# Patient Record
Sex: Male | Born: 1935 | Race: Black or African American | Hispanic: No | Marital: Married | Smoking: Never smoker
Health system: Southern US, Community
[De-identification: ages and names within clinical notes are randomized; demographics above are authoritative.]

## PROBLEM LIST (undated history)

## (undated) DIAGNOSIS — Z8719 Personal history of other diseases of the digestive system: Secondary | ICD-10-CM

## (undated) DIAGNOSIS — R569 Unspecified convulsions: Secondary | ICD-10-CM

## (undated) DIAGNOSIS — R011 Cardiac murmur, unspecified: Secondary | ICD-10-CM

## (undated) DIAGNOSIS — I495 Sick sinus syndrome: Secondary | ICD-10-CM

## (undated) DIAGNOSIS — D649 Anemia, unspecified: Secondary | ICD-10-CM

## (undated) DIAGNOSIS — K5790 Diverticulosis of intestine, part unspecified, without perforation or abscess without bleeding: Secondary | ICD-10-CM

## (undated) DIAGNOSIS — M8580 Other specified disorders of bone density and structure, unspecified site: Secondary | ICD-10-CM

## (undated) DIAGNOSIS — I4892 Unspecified atrial flutter: Secondary | ICD-10-CM

## (undated) DIAGNOSIS — I10 Essential (primary) hypertension: Secondary | ICD-10-CM

## (undated) DIAGNOSIS — I4891 Unspecified atrial fibrillation: Secondary | ICD-10-CM

## (undated) DIAGNOSIS — I639 Cerebral infarction, unspecified: Secondary | ICD-10-CM

## (undated) DIAGNOSIS — K219 Gastro-esophageal reflux disease without esophagitis: Secondary | ICD-10-CM

## (undated) DIAGNOSIS — K222 Esophageal obstruction: Secondary | ICD-10-CM

## (undated) DIAGNOSIS — E7800 Pure hypercholesterolemia, unspecified: Secondary | ICD-10-CM

## (undated) DIAGNOSIS — R001 Bradycardia, unspecified: Secondary | ICD-10-CM

## (undated) DIAGNOSIS — M1990 Unspecified osteoarthritis, unspecified site: Secondary | ICD-10-CM

## (undated) HISTORY — DX: Essential (primary) hypertension: I10

## (undated) HISTORY — DX: Sick sinus syndrome: I49.5

## (undated) HISTORY — DX: Esophageal obstruction: K22.2

## (undated) HISTORY — PX: JOINT REPLACEMENT: SHX530

## (undated) HISTORY — DX: Unspecified atrial fibrillation: I48.91

## (undated) HISTORY — PX: KNEE ARTHROSCOPY: SHX127

## (undated) HISTORY — DX: Gastro-esophageal reflux disease without esophagitis: K21.9

## (undated) HISTORY — DX: Diverticulosis of intestine, part unspecified, without perforation or abscess without bleeding: K57.90

## (undated) HISTORY — DX: Unspecified atrial flutter: I48.92

## (undated) HISTORY — DX: Other specified disorders of bone density and structure, unspecified site: M85.80

## (undated) HISTORY — DX: Unspecified convulsions: R56.9

---

## 1970-06-12 HISTORY — PX: ASD REPAIR: SHX258

## 1992-08-03 ENCOUNTER — Encounter: Payer: Self-pay | Admitting: Gastroenterology

## 1993-09-22 ENCOUNTER — Encounter: Payer: Self-pay | Admitting: Gastroenterology

## 1997-02-17 ENCOUNTER — Encounter: Payer: Self-pay | Admitting: Gastroenterology

## 2000-05-31 ENCOUNTER — Encounter: Admission: RE | Admit: 2000-05-31 | Discharge: 2000-05-31 | Payer: Self-pay | Admitting: General Practice

## 2000-05-31 ENCOUNTER — Encounter: Payer: Self-pay | Admitting: General Practice

## 2000-10-25 ENCOUNTER — Encounter (INDEPENDENT_AMBULATORY_CARE_PROVIDER_SITE_OTHER): Payer: Self-pay | Admitting: *Deleted

## 2000-10-25 ENCOUNTER — Encounter: Admission: RE | Admit: 2000-10-25 | Discharge: 2000-10-25 | Payer: Self-pay | Admitting: Internal Medicine

## 2000-10-25 ENCOUNTER — Encounter: Payer: Self-pay | Admitting: Internal Medicine

## 2000-11-19 ENCOUNTER — Encounter: Payer: Self-pay | Admitting: Gastroenterology

## 2000-11-27 ENCOUNTER — Ambulatory Visit (HOSPITAL_COMMUNITY): Admission: RE | Admit: 2000-11-27 | Discharge: 2000-11-27 | Payer: Self-pay | Admitting: Gastroenterology

## 2000-11-27 ENCOUNTER — Encounter (INDEPENDENT_AMBULATORY_CARE_PROVIDER_SITE_OTHER): Payer: Self-pay

## 2000-11-27 ENCOUNTER — Encounter: Payer: Self-pay | Admitting: Gastroenterology

## 2002-05-27 ENCOUNTER — Encounter: Payer: Self-pay | Admitting: Gastroenterology

## 2002-08-29 ENCOUNTER — Encounter: Payer: Self-pay | Admitting: Neurology

## 2002-08-29 ENCOUNTER — Encounter: Admission: RE | Admit: 2002-08-29 | Discharge: 2002-08-29 | Payer: Self-pay | Admitting: Neurology

## 2005-05-01 ENCOUNTER — Ambulatory Visit (HOSPITAL_COMMUNITY): Admission: RE | Admit: 2005-05-01 | Discharge: 2005-05-01 | Payer: Self-pay | Admitting: Neurology

## 2007-10-03 ENCOUNTER — Encounter: Admission: RE | Admit: 2007-10-03 | Discharge: 2007-10-03 | Payer: Self-pay | Admitting: Internal Medicine

## 2007-10-08 ENCOUNTER — Encounter: Admission: RE | Admit: 2007-10-08 | Discharge: 2007-10-08 | Payer: Self-pay | Admitting: Internal Medicine

## 2008-02-04 ENCOUNTER — Encounter: Admission: RE | Admit: 2008-02-04 | Discharge: 2008-02-04 | Payer: Self-pay | Admitting: Internal Medicine

## 2008-03-27 ENCOUNTER — Ambulatory Visit: Payer: Self-pay | Admitting: Internal Medicine

## 2008-11-16 ENCOUNTER — Ambulatory Visit: Payer: Self-pay | Admitting: Internal Medicine

## 2009-03-16 ENCOUNTER — Ambulatory Visit: Payer: Self-pay | Admitting: Internal Medicine

## 2009-04-16 ENCOUNTER — Encounter (INDEPENDENT_AMBULATORY_CARE_PROVIDER_SITE_OTHER): Payer: Self-pay | Admitting: *Deleted

## 2009-06-12 HISTORY — PX: INGUINAL HERNIA REPAIR: SUR1180

## 2010-01-13 ENCOUNTER — Encounter: Payer: Self-pay | Admitting: Gastroenterology

## 2010-01-13 ENCOUNTER — Ambulatory Visit: Payer: Self-pay | Admitting: Internal Medicine

## 2010-01-15 ENCOUNTER — Encounter (INDEPENDENT_AMBULATORY_CARE_PROVIDER_SITE_OTHER): Payer: Self-pay | Admitting: *Deleted

## 2010-01-17 ENCOUNTER — Encounter: Admission: RE | Admit: 2010-01-17 | Discharge: 2010-01-17 | Payer: Self-pay | Admitting: Internal Medicine

## 2010-02-15 ENCOUNTER — Ambulatory Visit (HOSPITAL_COMMUNITY): Admission: RE | Admit: 2010-02-15 | Discharge: 2010-02-15 | Payer: Self-pay | Admitting: Internal Medicine

## 2010-02-18 ENCOUNTER — Ambulatory Visit (HOSPITAL_COMMUNITY): Admission: RE | Admit: 2010-02-18 | Discharge: 2010-02-18 | Payer: Self-pay | Admitting: General Surgery

## 2010-02-18 ENCOUNTER — Encounter (INDEPENDENT_AMBULATORY_CARE_PROVIDER_SITE_OTHER): Payer: Self-pay | Admitting: General Surgery

## 2010-03-07 HISTORY — PX: CARDIOVASCULAR STRESS TEST: SHX262

## 2010-04-04 ENCOUNTER — Ambulatory Visit: Payer: Self-pay | Admitting: Internal Medicine

## 2010-06-12 DIAGNOSIS — I639 Cerebral infarction, unspecified: Secondary | ICD-10-CM

## 2010-06-12 HISTORY — DX: Cerebral infarction, unspecified: I63.9

## 2010-07-02 ENCOUNTER — Encounter: Payer: Self-pay | Admitting: Neurology

## 2010-07-03 ENCOUNTER — Encounter: Payer: Self-pay | Admitting: General Surgery

## 2010-07-12 ENCOUNTER — Ambulatory Visit
Admission: RE | Admit: 2010-07-12 | Discharge: 2010-07-12 | Payer: Self-pay | Source: Home / Self Care | Attending: Internal Medicine | Admitting: Internal Medicine

## 2010-07-14 ENCOUNTER — Encounter: Payer: Self-pay | Admitting: Gastroenterology

## 2010-07-14 ENCOUNTER — Other Ambulatory Visit: Payer: 59 | Admitting: Internal Medicine

## 2010-07-20 NOTE — Letter (Signed)
Summary: New Patient letter  [facility]  [address] [place], [place] [address]   Phone: [phone]  Fax: [phone]       07/14/2010 MRN: [id number]  [facility] [address] [place], [place]  [address]  Dear Jay Walters,  Welcome to the Gastroenterology Division at [facility].    You are scheduled to see Dr.  Russella Dar on 08-10-10 at 10:45am on the 3rd floor at [facility], [address]  We ask that you try to arrive at our office 15 minutes prior to your appointment time to allow for check-in.  We would like you to complete the enclosed self-administered evaluation form prior to your visit and bring it with you on the day of your appointment.  We will review it with you.  Also, please bring a complete list of all your medications or, if you prefer, bring the medication bottles and we will list them.  Please bring your insurance card so that we may make a copy of it.  If your insurance requires a referral to see a specialist, please bring your referral form from your primary care physician.  Co-payments are due at the time of your visit and may be paid by cash, check or credit card.     Your office visit will consist of a consult with your physician (includes a physical exam), any laboratory testing he/she may order, scheduling of any necessary diagnostic testing (e.g. x-ray, ultrasound, CT-scan), and scheduling of a procedure (e.g. Endoscopy, Colonoscopy) if required.  Please allow enough time on your schedule to allow for any/all of these possibilities.    If you cannot keep your appointment, please call [phone] to cancel or reschedule prior to your appointment date.  This allows [place] the opportunity to schedule an appointment for another patient in need of care.  If you do not cancel or reschedule by 5 p.m. the business day prior to your appointment date, you will be charged a $50.00 late cancellation/no-show fee.    Thank you for choosing  [facility] for your medical needs.  We appreciate the opportunity to care for you.  Please visit [place] at our website  to learn more about our practice.                     Sincerely,                                                             The Gastroenterology Division

## 2010-08-10 ENCOUNTER — Ambulatory Visit: Payer: 59 | Admitting: Gastroenterology

## 2010-08-16 ENCOUNTER — Encounter: Payer: Self-pay | Admitting: Gastroenterology

## 2010-08-16 ENCOUNTER — Ambulatory Visit (INDEPENDENT_AMBULATORY_CARE_PROVIDER_SITE_OTHER): Payer: 59 | Admitting: Gastroenterology

## 2010-08-16 DIAGNOSIS — K219 Gastro-esophageal reflux disease without esophagitis: Secondary | ICD-10-CM | POA: Insufficient documentation

## 2010-08-16 DIAGNOSIS — Z1211 Encounter for screening for malignant neoplasm of colon: Secondary | ICD-10-CM

## 2010-08-16 DIAGNOSIS — R1319 Other dysphagia: Secondary | ICD-10-CM

## 2010-08-18 NOTE — Assessment & Plan Note (Signed)
Summary: Gastroenterology     [facility]   GI CONSULTATION  NAME:  Jay Walters, Jay Walters            OFFICE NO:  [id number]  DATE:  11/19/2000      REQUESTING PHYSICIAN:  Dr. Eden Emms Baxley  HISTORY OF PRESENT ILLNESS:  The patient is a very nice 77 year old African-American male that I have seen in the past.  He notes difficulty swallowing primarily solids but also liquids for the past three months.  He has had occasions where he has had to regurgitate food to relieve a transient impaction.  He was evaluated by Dr. Lenord Fellers and underwent an upper GI series, which showed a small sliding hiatal hernia and a prominent Schatzki ring located at the esophagogastric junction, which caused delay at the Schatzki ring.  He was placed on Nexium and has had some improvement in his symptoms.  CURRENT MEDICATIONS:  As listed on the front of the chart, updated and reviewed.  ALLERGIES:  None known.  PHYSICAL EXAMINATION:  GENERAL:  In no acute distress.  VITAL SIGNS:  Weight 157 pounds.  Blood pressure is 120/70.  Pulse is 64 and regular.  HEENT:  Anicteric sclerae.  Oropharynx is clear.  CHEST:  Clear to auscultation and percussion.  CARDIAC:  Regular rate and rhythm without murmurs.  ABDOMEN:  Soft and nontender.  Nondistended with normoactive bowel sounds.  No palpable organomegaly, masses or hernias.  ASSESSMENT/PLAN: 1.   Dysphagia and esophageal stricture.  Rule out peptic stricture.  Unlikely to be neoplastic based on upper GI series findings.  Risks, benefits and alternatives to upper endoscopy with possible biopsy and Savary dilation discussed with the patient.  He consents to proceed.  This will be scheduled electively.  Continue antireflux measures and Nexium 40 mg q.d. for now.       Shamarr Faucett T. Pleas Koch., M.D., F.A.C.G.  C:  Dr. Eden Emms Baxley [id number]   D:  11/19/2000   T:  11/19/2000  Job # [id number]

## 2010-08-18 NOTE — Procedures (Signed)
Summary: Colonoscopy   Colonoscopy  Procedure date:  05/27/2002  Findings:      Results: Hemorrhoids.     Results: Diverticulosis.       Location:  [facility].    Procedures Next Due Date:    Colonoscopy: 06/2009  Colonoscopy  Procedure date:  05/27/2002  Findings:      Results: Hemorrhoids.     Results: Diverticulosis.       Location:  [facility].    Procedures Next Due Date:    Colonoscopy: 06/2009 Patient Name: Jay, Walters MRN: [id number] Procedure Procedures: Colonoscopy CPT: 54098.  Personnel: Endoscopist: Venita Lick. Russella Dar, MD, Clementeen Graham.  Exam Location: Exam performed in Outpatient Clinic. Outpatient  Patient Consent: Procedure, Alternatives, Risks and Benefits discussed, consent obtained, from patient. Consent was obtained by the RN.  Indications  Average Risk Screening Routine.  History  Pre-Exam Physical: Performed May 27, 2002. Entire physical exam was normal.  Exam Exam: Extent of exam reached: Cecum, extent intended: Cecum.  The cecum was identified by appendiceal orifice and IC valve. Colon retroflexion performed. ASA Classification: II. Tolerance: excellent.  Monitoring: Pulse and BP monitoring, Oximetry used. Supplemental O2 given.  Colon Prep Used Golytely for colon prep. Prep results: good.  Sedation Meds: Patient assessed and found to be appropriate for moderate (conscious) sedation. Fentanyl 50 mcg. given IV. Versed 5 mg. given IV.  Findings - DIVERTICULOSIS: Sigmoid Colon. Not bleeding. ICD9: Diverticulosis: 562.10. Comments: mild.  NORMAL EXAM: Cecum to Descending Colon.  HEMORRHOIDS: Internal. Size: Medium. Not bleeding. Not thrombosed. ICD9: Hemorrhoids, Internal: 455.0.   Assessment  Diagnoses: 562.10: Diverticulosis.  455.0: Hemorrhoids, Internal.   Events  Unplanned Interventions: No intervention was required.  Unplanned Events: There were no complications. Plans Medication Plan: Continue  current medications.  Patient Education: Patient given standard instructions for: Diverticulosis. Hemorrhoids.  Disposition: After procedure patient sent to recovery. After recovery patient sent home.  Scheduling/Referral: Colonoscopy, to [facility] T. Russella Dar, MD, [facility], around May 27, 2009.  Primary Care Provider, to Sharlet Salina, MD,    This report was created from the original endoscopy report, which was reviewed and signed by the above listed endoscopist.    cc: Sharlet Salina, MD

## 2010-08-18 NOTE — Procedures (Signed)
Summary: EGD and biopsy   EGD  Procedure date:  11/27/2000  Findings:      Findings: Stricture, Hiatal Hernia Location: [facility]    EGD  Procedure date:  11/27/2000  Findings:      Findings: Stricture, Hiatal Hernia Location: [facility]   Patient Name: Jay Walters, Jay Walters MRN: [id number] Procedure Procedures: Panendoscopy (EGD) CPT: 43235.    with biopsy(s)/brushing(s). CPT: D1846139.    with esophageal dilation. CPT: G9296129.  Personnel: Endoscopist: Venita Lick. Russella Dar, MD, Clementeen Graham.  Referred By: Sharlet Salina, MD.  Exam Location: Exam performed in Radiology. Outpatient  Patient Consent: Procedure, Alternatives, Risks and Benefits discussed, consent obtained, from patient.  Indications  Abnormal Exams, Studies: UGI, abnormal, do not suspect malignancy.  Therapeutics: Reason for exam: Esophageal dilation.  Symptoms: Dysphagia.  History  Pre-Exam Physical: Performed Nov 27, 2000  Cardio-pulmonary exam, HEENT exam, Abdominal exam, Extremity exam, Neurological exam, Mental status exam WNL.  Exam Exam Info: Maximum depth of insertion Duodenum, intended Duodenum. Patient position: on left side. Vocal cords not visualized. Gastric retroflexion performed. ASA Classification: II. Tolerance: good.  Sedation Meds: Patient assessed and found to be appropriate for moderate (conscious) sedation. Fentanyl 50 mcg. Versed 4 mg. Cetacaine Spray 2 sprays  Monitoring: BP and pulse monitoring done. Oximetry used. Supplemental O2 given  Fluoroscopy: Fluoroscopy was used.  Findings STRICTURE / STENOSIS: Stricture in Distal Esophagus.  Constriction: partial. Biopsy of Stricture/Steno  taken. ICD9: Esophageal Stricture: 530.3.  - Dilation: Distal Esophagus. Procedure was performed under Fluoroscopy. Savary dilator used, Diameter: 14 mm, Minimal Resistance, Minimal Heme present on extraction. Savary dilator used, Diameter: 15 mm, Minimal Resistance, Minimal Heme  present on extraction. Savary dilator used, Diameter: 16 mm, Minimal Resistance, Minimal Heme present on extraction. Patient tolerance good. Outcome: successful.  HIATAL HERNIA: Regular, 2 cms. in length. ICD9: Hernia, Hiatal: 553.3.  Assessment  Diagnoses: 530.3: Esophageal Stricture.  553.3: Hernia, Hiatal.   Events  Unplanned Intervention: No unplanned interventions were required.  Unplanned Events: There were no complications. Plans Medication(s): Await pathology. Continue current medications. PPI: Esomeprazole/Nexium 40 mg QD, for indefinitely.   Patient Education: Patient given standard instructions for: Reflux. Stenosis / Stricture.  Disposition: After procedure patient sent to recovery.  Scheduling: Office Visit, to [employer]. Russella Dar, MD, Clementeen Graham, prn  Referring provider, to Sharlet Salina, MD, Nov 28, 2000.    This report was created from the original endoscopy report, which was reviewed and signed by the above listed endoscopist.    cc: Luanna Cole. Lenord Fellers, MD    SP Surgical Pathology - STATUS: Final             By: Guilford Shi MD , Clovis Pu       Perform Date: 18Jun02 08:47  Ordered By: Rica Records Date:  Facility: [facility]                              Department: CPATH  Service Report Text  [facility]   [address] [place], [place] [address]   [phone]    REPORT OF SURGICAL PATHOLOGY    Case #: [id number]   Patient Name: Jay Walters, Jay Walters   PID: [id number]   Pathologist: Marcie Bal, MD   DOB/Age 05-23-1932 (Age: 77) Gender: M   Date Taken: 11/27/2000   Date Received: 11/27/2000    FINAL  DIAGNOSIS    ***MICROSCOPIC EXAMINATION AND DIAGNOSIS***    INFLAMED SQUAMOUS MUCOSA CONSISTENT WITH REFLUX ESOPHAGITIS, NO   BARRETT' S ESOPHAGUS OR TUMOR IDENTIFIED.    COMMENT   An Alcian Blue stain is performed to determine the presence of   intestinal metaplasia. No intestinal metaplasia is identified    with the Alcian Blue stain. The control stained appropriately.    ab   Date Reported: 11/28/2000 Marcie Bal, MD   *** Electronically Signed Out By TAZ ***    Clinical information   Dysphagia, R/O peptic stricture. (tmc)    specimen(s) obtained   Distal esophageal stricture, biopsy    Gross Description   Received in formalin is a tan, soft tissue fragment that is   submitted en toto. Size: 0.2 cm (SSW:smr 6/18)    smr/

## 2010-08-23 NOTE — Letter (Signed)
Summary: Sharlet Salina MD  Sharlet Salina MD   Imported By: Lester Blanchard 08/19/2010 07:17:11  _____________________________________________________________________  External Attachment:    Type:   Image     Comment:   External Document

## 2010-08-23 NOTE — Procedures (Signed)
Summary: EGD/[facility]  EGD/[facility]   Imported By: Sherian Rein 08/17/2010 07:18:20  _____________________________________________________________________  External Attachment:    Type:   Image     Comment:   External Document

## 2010-08-23 NOTE — Procedures (Signed)
Summary: Flexible Sigmoidoscopy/[facility]  Flexible Sigmoidoscopy/ [facility]   Imported By: Sherian Rein 08/17/2010 07:16:51  _____________________________________________________________________  External Attachment:    Type:   Image     Comment:   External Document

## 2010-08-23 NOTE — Letter (Signed)
Summary: EGD Instructions  [facility]  [address] [place], [place] [address]   Phone: [phone]  Fax: [phone]       Jay Walters    1932/04/15    MRN: [id number]       Procedure Day /Date: Tuesday March 27th, 2012     Arrival Time:  3:00pm     Procedure Time: 4:00pm     Location of Procedure:                    _ x _ [facility] (4th Floor)    PREPARATION FOR ENDOSCOPY   On 09/06/10 THE DAY OF THE PROCEDURE:  1.   No solid foods, milk or milk products are allowed after midnight the night before your procedure.  2.   Do not drink anything colored red or purple.  Avoid juices with pulp.  No orange juice.  3.  You may drink clear liquids until 2:00pm, which is 2 hours before your procedure.                                                                                                CLEAR LIQUIDS INCLUDE: Water Jello Ice Popsicles Tea (sugar ok, no milk/cream) Powdered fruit flavored drinks Coffee (sugar ok, no milk/cream) Gatorade Juice: apple, white grape, white cranberry  Lemonade Clear bullion, consomm, broth Carbonated beverages (any kind) Strained chicken noodle soup Hard Candy   MEDICATION INSTRUCTIONS  Unless otherwise instructed, you should take regular prescription medications with a small sip of water as early as possible the morning of your procedure.        OTHER INSTRUCTIONS  You will need a responsible adult at least 75 years of age to accompany you and drive you home.   This person must remain in the waiting room during your procedure.  Wear loose fitting clothing that is easily removed.  Leave jewelry and other valuables at home.  However, you may wish to bring a book to read or an iPod/MP3 player to listen to music as you wait for your procedure to start.  Remove all body piercing jewelry and leave at home.  Total time from sign-in until discharge is approximately 2-3 hours.  You should go home  directly after your procedure and rest.  You can resume normal activities the day after your procedure.  The day of your procedure you should not:   Drive   Make legal decisions   Operate machinery   Drink alcohol   Return to work  You will receive specific instructions about eating, activities and medications before you leave.    The above instructions have been reviewed and explained to me by   Marchelle Folks.     I fully understand and can verbalize these instructions _____________________________ Date _________

## 2010-08-23 NOTE — Assessment & Plan Note (Signed)
Summary: DYSPHAGIA/SCHED W-STACY/INS [employer]/MAILER SENT/CX FEE   History of Present Illness Visit Type: Initial Consult Primary GI MD: Elie Goody MD [facility] Primary Provider: Marlan Palau, MD Requesting Provider: Marlan Palau, MD Chief Complaint: dysphagia, up until 1 month ago, symptoms stopped with medication History of Present Illness:    This is a 77 year old male is seen in the past for GERD with an esophageal stricture. He underwent upper endoscopy with dilation in June 2002. He states he took Nexium on a regular basis since that time. He developed problems with intermittent solid food dysphagia, mainly to meat, rice and griits, for the past year. His primary physician changed him from Nexium to Protonix and for the past month he has not had dysphasia.   GI Review of Systems    Reports acid reflux and  dysphagia with solids.      Denies abdominal pain, belching, bloating, chest pain, dysphagia with liquids, heartburn, loss of appetite, nausea, vomiting, vomiting blood, weight loss, and  weight gain.        Denies anal fissure, black tarry stools, change in bowel habit, constipation, diarrhea, diverticulosis, fecal incontinence, heme positive stool, hemorrhoids, irritable bowel syndrome, jaundice, light color stool, liver problems, rectal bleeding, and  rectal pain.   Current Medications (verified): 1)  Digoxin 0.125 Mg Tabs (Digoxin) .... Take 1/2 Tablet By Mouth Once Daily 2)  Finasteride 5 Mg Tabs (Finasteride) .... Take 1 Tablet By Mouth Once Daily 3)  Protonix 40 Mg Tbec (Pantoprazole Sodium) .... Take 1 Tablet By Mouth Once Daily 4)  Silymarin  Caps (Milk Thistle-Turmeric) .... Once Daily 5)  Ginseng 250 Mg Caps (Ginseng) .... Once Daily 6)  Vitamin D3 1000 Unit Caps (Cholecalciferol) .... Take 2 Capsule Once Daily 7)  Aleve 220 Mg Tabs (Naproxen Sodium) .... Take 2 By Mouth Once Daily As Needed 8)  Aspirin 81 Mg Tbec (Aspirin) .... Once Daily 9)  Doxazosin Mesylate 4  Mg Tabs (Doxazosin Mesylate) .... Take 1 Tablet By Mouth Once Daily 10)  Lipitor 40 Mg Tabs (Atorvastatin Calcium) .... Take 1 Tablet By Mouth Once Daily 11)  Phenytoin Sodium Extended 100 Mg Caps (Phenytoin Sodium Extended) .... At Bedtime  Allergies (verified): No Known Drug Allergies  Past History:  Past Medical History: GERD Hiatal hernia Esophageal Stricture Atrial Fibrillation Diverticulosis Hemorrhoids Hypertension Seizures Osteopenia  Past Surgical History: ASD repair 1972 Knee arthroscopy Right inguinal hernia repair 2011  Family History: No FH of Colon Cancer: Family History of Heart Disease: Father  Social History: Married Patient is a former smoker.  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         The pertinent positives and negatives are noted as above and in the HPI. All other ROS were reviewed and were negative.   Vital Signs:  Patient profile:   77 year old male Height:      69 inches Weight:      143.38 pounds BMI:     21.25 Pulse rate:   64 / minute Pulse rhythm:   regular BP sitting:   130 / 70  (right arm) Cuff size:   regular  Vitals Entered By: June McMurray CMA Duncan Dull) (August 16, 2010 2:24 PM)  Physical Exam  General:  Well developed, well nourished, no acute distress. Head:  Normocephalic and atraumatic. Eyes:  PERRLA,  no icterus. Ears:  Normal auditory acuity. Mouth:  No deformity or lesions, dentition normal. Neck:  Supple; no masses or thyromegaly. Lungs:  Clear throughout to auscultation. Heart:  Regular rate and rhythm; no murmurs, rubs,  or bruits. Abdomen:  Soft, nontender and nondistended. No masses, hepatosplenomegaly or hernias noted. Normal bowel sounds. Msk:  Symmetrical with no gross deformities. Normal posture. Pulses:  Normal pulses noted. Extremities:  No clubbing, cyanosis,  edema or deformities noted. Neurologic:  Alert and  oriented x4;  grossly normal neurologically. Cervical Nodes:  No significant cervical adenopathy. Inguinal Nodes:  No significant inguinal adenopathy. Psych:  Alert and cooperative. Normal mood and affect.  Impression & Recommendations:  Problem # 1:  DYSPHAGIA (ICD-787.29)  Solid food dysphagia. I suspect he has a recurrent esophageal stricture. Continue standard antireflux measures and Protonix on a daily basis. The risks, benefits and alternatives to endoscopy with possible biopsy and possible dilation were discussed with the patient and they consent to proceed. The procedure will be scheduled electively. Orders: EGD SAV (EGD SAV)  Problem # 2:  GERD (ICD-530.81)  As above. Orders: EGD SAV (EGD SAV)  Problem # 3:  SCREENING COLORECTAL-CANCER (ICD-V76.51)  Screening colonoscopy recommended December 2013.  Patient Instructions: 1)  Upper Endoscopy with Dilatation brochure given.  2)  Copy sent to : Marlan Palau, MD 3)  The medication list was reviewed and reconciled.  All changed / newly prescribed medications were explained.  A complete medication list was provided to the patient / caregiver.

## 2010-08-23 NOTE — Procedures (Signed)
Summary: [employer]   Imported By: Sherian Rein 08/17/2010 07:15:12  _____________________________________________________________________  External Attachment:    Type:   Image     Comment:   External Document

## 2010-08-23 NOTE — Procedures (Signed)
Summary: Flexible Sigmoidoscopy/[facility]  Flexible Sigmoidoscopy/[facility]   Imported By: Sherian Rein 08/17/2010 07:20:08  _____________________________________________________________________  External Attachment:    Type:   Image     Comment:   External Document

## 2010-08-25 LAB — CBC
HCT: 41.6 % (ref 39.0–52.0)
MCHC: 33.2 g/dL (ref 30.0–36.0)
RDW: 13.6 % (ref 11.5–15.5)

## 2010-08-25 LAB — DIFFERENTIAL
Basophils Absolute: 0 10*3/uL (ref 0.0–0.1)
Basophils Relative: 1 % (ref 0–1)
Eosinophils Absolute: 0.1 10*3/uL (ref 0.0–0.7)
Eosinophils Relative: 2 % (ref 0–5)
Monocytes Absolute: 0.3 10*3/uL (ref 0.1–1.0)
Neutro Abs: 1.5 10*3/uL — ABNORMAL LOW (ref 1.7–7.7)

## 2010-08-25 LAB — BASIC METABOLIC PANEL
BUN: 10 mg/dL (ref 6–23)
GFR calc non Af Amer: >60 mL/min (ref >60)
Glucose, Bld: 96 mg/dL (ref 70–99)
Potassium: 4.2 mEq/L (ref 3.5–5.1)

## 2010-09-06 ENCOUNTER — Ambulatory Visit (AMBULATORY_SURGERY_CENTER): Payer: 59 | Admitting: Gastroenterology

## 2010-09-06 ENCOUNTER — Encounter: Payer: Self-pay | Admitting: Gastroenterology

## 2010-09-06 VITALS — BP 147/72 | HR 52 | Temp 96.6°F | Resp 20 | Ht 69.0 in | Wt 143.0 lb

## 2010-09-06 DIAGNOSIS — R1319 Other dysphagia: Secondary | ICD-10-CM

## 2010-09-06 DIAGNOSIS — K209 Esophagitis, unspecified: Secondary | ICD-10-CM

## 2010-09-06 DIAGNOSIS — K219 Gastro-esophageal reflux disease without esophagitis: Secondary | ICD-10-CM

## 2010-09-06 DIAGNOSIS — R1310 Dysphagia, unspecified: Secondary | ICD-10-CM

## 2010-09-06 DIAGNOSIS — K222 Esophageal obstruction: Secondary | ICD-10-CM

## 2010-09-06 DIAGNOSIS — R933 Abnormal findings on diagnostic imaging of other parts of digestive tract: Secondary | ICD-10-CM

## 2010-09-06 NOTE — Progress Notes (Signed)
Dilated with savory 14mm 15mm 16mm

## 2010-09-06 NOTE — Patient Instructions (Signed)
Findings: Esophagitis r/o candida Esophageal Stricture-Dilated Small Hiatal Hernia  Await pathology results Dilatation diet as follows: NOTHING BY MOUTH UNTIL 5:15pm     -from 5:15pm-6:15pm you may have CLEAR LIQUIDS ONLY     -6:15pm until tomorrow you may have a soft diet     -please see the instruction sheet for recommended foods to eat during this time

## 2010-09-07 ENCOUNTER — Telehealth: Payer: Self-pay | Admitting: *Deleted

## 2010-09-07 NOTE — Telephone Encounter (Signed)
Message left to call if needs anything. 

## 2010-09-12 ENCOUNTER — Encounter: Payer: Self-pay | Admitting: Gastroenterology

## 2010-09-13 NOTE — Procedures (Signed)
Summary: Upper Endoscopy w/DIL  Patient: [name] Note: All result statuses are Final unless otherwise noted.  Tests: (1) Upper Endoscopy w/DIL (UED)  UED Upper Endoscopy w/DIL                             DONE      Endoscopy Center     [address]     [place], [place]  [address]          ENDOSCOPY PROCEDURE REPORT          PATIENT:  Jay, Walters  MR#:  [id number]     BIRTHDATE:  1931-08-04, 78 yrs. old  GENDER:  male     ENDOSCOPIST:  Judie Petit T. Russella Dar, MD, [facility]          PROCEDURE DATE:  09/06/2010     PROCEDURE:  EGD with dilatation over guidewire, EGD with biopsy     ASA CLASS:  Class II     INDICATIONS:  1) dysphagia  2) GERD     MEDICATIONS:  Fentanyl 25 mcg IV, Versed 2 mg IV     TOPICAL ANESTHETIC:  Exactacain Spray     DESCRIPTION OF PROCEDURE:   After the risks benefits and     alternatives of the procedure were thoroughly explained, informed     consent was obtained.  The [id number] endoscope was     introduced through the mouth and advanced to the second portion of     the duodenum, without limitations.  The instrument was slowly     withdrawn as the mucosa was carefully examined.     <<PROCEDUREIMAGES>>     Esophagitis was found in the distal esophagus with scattered white     exudates. Multiple biopsies were obtained and sent to pathology.     A stricture was found at the gastroesophageal junction. It was     circumferential and benign appearing. It was 12 mm in diameter.     Multiple biopsies were obtained and sent to pathology. The stomach     was entered and closely examined. The pylorus, antrum, angularis,     and lesser curvature were well visualized, including a retroflexed     view of the cardia and fundus. The stomach wall was normally     distensable. The scope passed easily through the pylorus into the     duodenum.  The duodenal bulb was normal in appearance, as was the     postbulbar duodenum.  The examination was otherwise  normal.  A     hiatal hernia was found in the cardia, small.  Dilation was then     performed at the gastroesphageal junction:          1) Dilator:  Savary over guidewire  Sizes:  14mm, 15mm, 16mm     Resistance:  minimal  Heme:  none          COMPLICATIONS:  None          ENDOSCOPIC IMPRESSION:     1) Exudative esophagitis-R/O candida     2) Stricture at the gastroesophageal junction     3) Small hiatal hernia          RECOMMENDATIONS:     1) await pathology results     2) anti-reflux regimen long term     3) continue PPI long term     4) post dilation instructions  Venita Lick. Russella Dar, MD, Clementeen Graham          CC: Sharlet Salina, MD          n.     Rosalie DoctorVenita Lick. Quade Ramirez at 09/06/2010 04:11 PM          [address], [place], [id number]  Note: An exclamation mark (!) indicates a result that was not dispersed into the flowsheet. Document Creation Date: 09/06/2010 4:12 PM _______________________________________________________________________  (1) Order result status: Final Collection or observation date-time: 09/06/2010 16:01 Requested date-time:  Receipt date-time:  Reported date-time:  Referring Physician:   Ordering Physician: Claudette Head [phone]) Specimen Source:  Source: Launa Grill Order Number: [id number] Lab site:

## 2011-03-14 ENCOUNTER — Encounter: Payer: Self-pay | Admitting: Internal Medicine

## 2011-04-18 ENCOUNTER — Ambulatory Visit (INDEPENDENT_AMBULATORY_CARE_PROVIDER_SITE_OTHER): Payer: 59 | Admitting: Internal Medicine

## 2011-04-18 ENCOUNTER — Encounter: Payer: Self-pay | Admitting: Internal Medicine

## 2011-04-18 VITALS — BP 142/62 | HR 68 | Temp 96.9°F | Ht 69.25 in | Wt 136.0 lb

## 2011-04-18 DIAGNOSIS — M5410 Radiculopathy, site unspecified: Secondary | ICD-10-CM

## 2011-04-18 DIAGNOSIS — E559 Vitamin D deficiency, unspecified: Secondary | ICD-10-CM | POA: Insufficient documentation

## 2011-04-18 DIAGNOSIS — Z8774 Personal history of (corrected) congenital malformations of heart and circulatory system: Secondary | ICD-10-CM

## 2011-04-18 DIAGNOSIS — G40909 Epilepsy, unspecified, not intractable, without status epilepticus: Secondary | ICD-10-CM

## 2011-04-18 DIAGNOSIS — M8580 Other specified disorders of bone density and structure, unspecified site: Secondary | ICD-10-CM | POA: Insufficient documentation

## 2011-04-18 DIAGNOSIS — M949 Disorder of cartilage, unspecified: Secondary | ICD-10-CM

## 2011-04-18 DIAGNOSIS — R29898 Other symptoms and signs involving the musculoskeletal system: Secondary | ICD-10-CM

## 2011-04-18 DIAGNOSIS — M542 Cervicalgia: Secondary | ICD-10-CM

## 2011-04-18 DIAGNOSIS — Z8679 Personal history of other diseases of the circulatory system: Secondary | ICD-10-CM

## 2011-04-18 DIAGNOSIS — M5412 Radiculopathy, cervical region: Secondary | ICD-10-CM

## 2011-04-18 DIAGNOSIS — Z9889 Other specified postprocedural states: Secondary | ICD-10-CM

## 2011-04-18 DIAGNOSIS — Z23 Encounter for immunization: Secondary | ICD-10-CM

## 2011-04-18 DIAGNOSIS — G43909 Migraine, unspecified, not intractable, without status migrainosus: Secondary | ICD-10-CM | POA: Insufficient documentation

## 2011-04-18 DIAGNOSIS — N400 Enlarged prostate without lower urinary tract symptoms: Secondary | ICD-10-CM

## 2011-04-18 DIAGNOSIS — M1710 Unilateral primary osteoarthritis, unspecified knee: Secondary | ICD-10-CM

## 2011-04-18 DIAGNOSIS — M1711 Unilateral primary osteoarthritis, right knee: Secondary | ICD-10-CM

## 2011-04-18 NOTE — Progress Notes (Signed)
Subjective:    Patient ID: Jay Walters, male    DOB: 21-Sep-1931, 77 y.o.   MRN: [id number]  HPI 77 year old black male patient in this practice since 1992. He presented today thinking he had issues with arthritis but was noted to be significantly weak in his right upper extremity and right lower extremity. He said he had onset of these symptoms on Friday while working [place] the golf course. Has felt pain in right neck and shoulder area. Has numbness in right hand. Says right leg has been weak to the point of tripping but this may be a problem that was present before Friday, November 2.  Patient has a history of GE reflux and esophagitis. Had esophageal stricture dilated by Dr. Russella Dar March 2012. Had right inguinal hernia repair September 2011. Had right knee medial meniscectomy by Dr. Darrelyn Hillock  2009. History of Schatzki's ring dilated May 2002. Had colonoscopy 2003. Dr. Alanda Amass is his cardiologist. History of paroxysmal atrial flutter. Had Myoview study September 2011 which was normal. Had 2-D echocardiogram September 2010 showing mildly dilated left atrium. Followed by Dr. love and formerly by Dr. Ninetta Lights for seizure disorder. Also remote history in the 1990s of migraine headache. History of osteopenia and took Actonel for 6 years. This was discontinued in 2010.   Patient has history of generalized seizure disorder. He had head trauma as a child. 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 Apparently had seizures for some 17 years, nocturnal in type- total  of 8 in his life but none in many years.  History of atrial septal defect repaired by Dr. Ola Spurr October 1972. This was a secundum atrial septal defect of moderate size. At that time he was on phenobarbital.  Dr. Sandria Manly has seen him in the past for history of seizures and headache. Dr. Sandria Manly   Social history patient has been married twice. Does not smoke. Very occasional alcohol consumption. Has always  enjoyed golf. 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 This year he's not been able to play much golf because of "arthritis ". Has 2 adult children- a son from second marriage and a daughter.  Family history: Father died between the ages of 70 and 68 of heart failure with history of alcoholism. Mother with history of heart problems. One brother died at age 94 from cirrhosis of the liver due to alcoholism. One brother with good health. 4 sisters with good health.    Review of Systems patient complains of weight loss but he actually weighed 141 pounds in January 2012 so has only lost about 5 pounds. Says that neck pain is rather severe and he has to lie down on the floor to get relief otherwise pain goes on for some 15 or 20 minutes. Says this really is a new problem since November 2. He looks fatigued. He looks a bit thin. Weight was 142 pounds in August 2011. Weight was 158 pounds in May 2005.     Objective:   Physical Exam fatigued-appearing black male somewhat thin. Muscle strength in the right upper extremity is 4/5 in all groups tested. Is able to raise his right arm up over his head but says that it has been an issue before today. Says he works out with some hand weights and that he's had difficulty lifting the weight with his right hand. Deep tendon reflexes in the right upper extremity are 2+ and symmetrical. His  right lower extremity is also weak 4/5 in all groups tested. Noticeable quadriceps weakness.        Assessment & Plan:  New onset weakness right upper extremity with radiculopathy and neck pain-? Nerve root impingement onset around November 2  Right lower extremity weakness-etiology unclear but by history seems to have occurred before the event of November 2  History of seizure disorder in the remote past with encephalomalacia noted on MRI 1991 thought to be due to head injury as a child  History of atrial septal defect repair  1972  History of esophagitis and esophageal stricture dilated March 2012  History of paroxysmal atrial flutter. Cardiologist is Dr. Alanda Amass  History of osteopenia and vitamin D deficiency. Osteopenia has been attributed to Dilantin. Patient took Actonel for 6 years and has been taken off of bisphosphonate in 2010.  Plan: Patient is to have an MRI with contrast of his brain and also C-spine MRI. Nonfasting labs been drawn today. Have given him Vicodin 5/500 to take one every 6 hours as needed for neck pain. Consider giving him steroids but want to get results of MRI studies first. Would like for him to have Neurology consultation

## 2011-04-18 NOTE — Patient Instructions (Signed)
Take Vicodin as needed for pain. We have scheduled due to have MRI studies of the neck and brain. Call if symptoms worsen

## 2011-04-19 ENCOUNTER — Telehealth: Payer: Self-pay

## 2011-04-19 LAB — CBC WITH DIFFERENTIAL/PLATELET
Basophils Absolute: 0 10*3/uL (ref 0.0–0.1)
Eosinophils Absolute: 0 10*3/uL (ref 0.0–0.7)
Eosinophils Relative: 1 % (ref 0–5)
MCH: 30.1 pg (ref 26.0–34.0)
MCHC: 32.8 g/dL (ref 30.0–36.0)
MCV: 91.7 fL (ref 78.0–100.0)
Platelets: 182 10*3/uL (ref 150–400)
RDW: 14 % (ref 11.5–15.5)
WBC: 2.5 10*3/uL — ABNORMAL LOW (ref 4.0–10.5)

## 2011-04-19 LAB — COMPREHENSIVE METABOLIC PANEL
ALT: 19 U/L (ref 0–53)
AST: 19 U/L (ref 0–37)
BUN: 24 mg/dL — ABNORMAL HIGH (ref 6–23)
Creat: 1.14 mg/dL (ref 0.50–1.35)
Total Bilirubin: 0.3 mg/dL (ref 0.3–1.2)

## 2011-04-19 LAB — SEDIMENTATION RATE: Sed Rate: 4 mm/hr (ref 0–16)

## 2011-04-19 LAB — CK TOTAL AND CKMB (NOT AT ARMC)
CK, MB: 3.5 ng/mL (ref 0.3–4.0)
Total CK: 86 U/L (ref 7–232)

## 2011-04-19 NOTE — Progress Notes (Signed)
Addended by: Judy Pimple on: 04/19/2011 10:35 AM   Modules accepted: Orders

## 2011-04-19 NOTE — Telephone Encounter (Signed)
Patient  Scheduled for Mri of brain and c-spine on Saturday 04/22/2011 at 2;45 at [facility] [address] [place]

## 2011-04-19 NOTE — Progress Notes (Signed)
Addended by: Judy Pimple on: 04/19/2011 11:29 AM   Modules accepted: Orders

## 2011-04-22 ENCOUNTER — Other Ambulatory Visit: Payer: 59

## 2011-04-26 ENCOUNTER — Ambulatory Visit
Admission: RE | Admit: 2011-04-26 | Discharge: 2011-04-26 | Disposition: A | Discharge status: Home / Self Care | Payer: 59 | Source: Ambulatory Visit | Attending: Internal Medicine | Admitting: Internal Medicine

## 2011-04-26 DIAGNOSIS — R29898 Other symptoms and signs involving the musculoskeletal system: Secondary | ICD-10-CM

## 2011-04-26 DIAGNOSIS — G40909 Epilepsy, unspecified, not intractable, without status epilepticus: Secondary | ICD-10-CM

## 2011-04-26 MED ORDER — GADOBENATE DIMEGLUMINE 529 MG/ML IV SOLN
13.0000 mL | Freq: Once | INTRAVENOUS | Status: AC | PRN
  Administered 2011-04-26: 13 mL via INTRAVENOUS

## 2011-05-01 ENCOUNTER — Other Ambulatory Visit: Payer: Self-pay | Admitting: Orthopedic Surgery

## 2011-05-02 ENCOUNTER — Telehealth: Payer: Self-pay | Admitting: Internal Medicine

## 2011-05-02 NOTE — Telephone Encounter (Signed)
Pt advised per Dr. Lenord Fellers to contact Dr. Imagene Gurney office.  Per correspondence from Dr. Sandria Manly, physical therapy was mention, as well as referral to another physician.  Patient advised that after contacting Dr. Imagene Gurney office if he had not received referral from Dr. Sandria Manly within a week to call us back and Dr. Lenord Fellers would handle the appropriate referrals.  Pt verbalized understanding.

## 2011-05-08 ENCOUNTER — Ambulatory Visit (HOSPITAL_COMMUNITY)
Admission: RE | Admit: 2011-05-08 | Discharge: 2011-05-08 | Disposition: A | Discharge status: Home / Self Care | Payer: 59 | Source: Ambulatory Visit | Attending: Neurology | Admitting: Neurology

## 2011-05-08 ENCOUNTER — Encounter: Payer: Self-pay | Admitting: Diagnostic Neuroimaging

## 2011-05-08 ENCOUNTER — Other Ambulatory Visit (HOSPITAL_COMMUNITY): Payer: Self-pay | Admitting: Neurology

## 2011-05-08 ENCOUNTER — Inpatient Hospital Stay (HOSPITAL_COMMUNITY)
Admission: AD | Admit: 2011-05-08 | Discharge: 2011-05-16 | DRG: 098 | Disposition: A | Discharge status: Rehabilitation Facility | Payer: 59 | Source: Ambulatory Visit | Attending: Diagnostic Neuroimaging | Admitting: Diagnostic Neuroimaging

## 2011-05-08 ENCOUNTER — Other Ambulatory Visit: Payer: Self-pay

## 2011-05-08 ENCOUNTER — Telehealth: Payer: Self-pay | Admitting: Internal Medicine

## 2011-05-08 DIAGNOSIS — M47812 Spondylosis without myelopathy or radiculopathy, cervical region: Secondary | ICD-10-CM

## 2011-05-08 DIAGNOSIS — G0489 Other myelitis: Principal | ICD-10-CM | POA: Diagnosis present

## 2011-05-08 DIAGNOSIS — G43909 Migraine, unspecified, not intractable, without status migrainosus: Secondary | ICD-10-CM | POA: Diagnosis present

## 2011-05-08 DIAGNOSIS — K219 Gastro-esophageal reflux disease without esophagitis: Secondary | ICD-10-CM | POA: Diagnosis present

## 2011-05-08 DIAGNOSIS — G40909 Epilepsy, unspecified, not intractable, without status epilepticus: Secondary | ICD-10-CM | POA: Diagnosis present

## 2011-05-08 DIAGNOSIS — I658 Occlusion and stenosis of other precerebral arteries: Secondary | ICD-10-CM | POA: Diagnosis present

## 2011-05-08 DIAGNOSIS — M1710 Unilateral primary osteoarthritis, unspecified knee: Secondary | ICD-10-CM | POA: Diagnosis present

## 2011-05-08 DIAGNOSIS — I6529 Occlusion and stenosis of unspecified carotid artery: Secondary | ICD-10-CM | POA: Diagnosis present

## 2011-05-08 DIAGNOSIS — Z87828 Personal history of other (healed) physical injury and trauma: Secondary | ICD-10-CM

## 2011-05-08 DIAGNOSIS — R1310 Dysphagia, unspecified: Secondary | ICD-10-CM | POA: Diagnosis present

## 2011-05-08 DIAGNOSIS — M899 Disorder of bone, unspecified: Secondary | ICD-10-CM | POA: Diagnosis present

## 2011-05-08 DIAGNOSIS — R001 Bradycardia, unspecified: Secondary | ICD-10-CM

## 2011-05-08 DIAGNOSIS — M4712 Other spondylosis with myelopathy, cervical region: Secondary | ICD-10-CM | POA: Insufficient documentation

## 2011-05-08 DIAGNOSIS — I498 Other specified cardiac arrhythmias: Secondary | ICD-10-CM | POA: Diagnosis present

## 2011-05-08 DIAGNOSIS — G959 Disease of spinal cord, unspecified: Secondary | ICD-10-CM

## 2011-05-08 DIAGNOSIS — R209 Unspecified disturbances of skin sensation: Secondary | ICD-10-CM | POA: Insufficient documentation

## 2011-05-08 DIAGNOSIS — E559 Vitamin D deficiency, unspecified: Secondary | ICD-10-CM | POA: Diagnosis present

## 2011-05-08 DIAGNOSIS — N400 Enlarged prostate without lower urinary tract symptoms: Secondary | ICD-10-CM | POA: Diagnosis present

## 2011-05-08 DIAGNOSIS — I4892 Unspecified atrial flutter: Secondary | ICD-10-CM | POA: Diagnosis present

## 2011-05-08 DIAGNOSIS — R339 Retention of urine, unspecified: Secondary | ICD-10-CM | POA: Diagnosis present

## 2011-05-08 DIAGNOSIS — E7800 Pure hypercholesterolemia, unspecified: Secondary | ICD-10-CM | POA: Diagnosis present

## 2011-05-08 DIAGNOSIS — Z8774 Personal history of (corrected) congenital malformations of heart and circulatory system: Secondary | ICD-10-CM

## 2011-05-08 LAB — BUN: BUN: 17 mg/dL (ref 6–23)

## 2011-05-08 LAB — CREATININE, SERUM
Creatinine, Ser: 0.92 mg/dL (ref 0.50–1.35)
GFR calc non Af Amer: 79 mL/min — ABNORMAL LOW (ref >90)

## 2011-05-08 LAB — CBC
Hemoglobin: 12.7 g/dL — ABNORMAL LOW (ref 13.0–17.0)
RBC: 4.16 MIL/uL — ABNORMAL LOW (ref 4.22–5.81)

## 2011-05-08 MED ORDER — TRAMADOL HCL 50 MG PO TABS
100.0000 mg | ORAL_TABLET | Freq: Three times a day (TID) | ORAL | Status: DC | PRN
  Administered 2011-05-09: 100 mg via ORAL
  Filled 2011-05-08 (×2): qty 2

## 2011-05-08 MED ORDER — SODIUM CHLORIDE 0.9 % IV SOLN
INTRAVENOUS | Status: DC
  Administered 2011-05-09 – 2011-05-12 (×5): via INTRAVENOUS

## 2011-05-08 MED ORDER — VITAMIN D 1000 UNITS PO TABS
2000.0000 [IU] | ORAL_TABLET | Freq: Every day | ORAL | Status: DC
  Administered 2011-05-09 – 2011-05-16 (×8): 2000 [IU] via ORAL
  Filled 2011-05-08 (×9): qty 2

## 2011-05-08 MED ORDER — DIGOXIN 0.0625 MG HALF TABLET
62.5000 ug | ORAL_TABLET | Freq: Every day | ORAL | Status: DC
  Filled 2011-05-08: qty 1

## 2011-05-08 MED ORDER — GINSENG 250 MG PO CAPS: 1.0000 | ORAL_CAPSULE | Freq: Every day | ORAL | Status: DC

## 2011-05-08 MED ORDER — SILYMARIN PO CAPS: 1.0000 | ORAL_CAPSULE | Freq: Every day | ORAL | Status: DC

## 2011-05-08 MED ORDER — SENNOSIDES-DOCUSATE SODIUM 8.6-50 MG PO TABS
1.0000 | ORAL_TABLET | Freq: Every day | ORAL | Status: DC | PRN
  Administered 2011-05-10: 1 via ORAL
  Filled 2011-05-08: qty 1

## 2011-05-08 MED ORDER — DOXAZOSIN MESYLATE 4 MG PO TABS
4.0000 mg | ORAL_TABLET | Freq: Every evening | ORAL | Status: DC
  Administered 2011-05-09 – 2011-05-15 (×6): 4 mg via ORAL
  Filled 2011-05-08 (×8): qty 1

## 2011-05-08 MED ORDER — ACETAMINOPHEN 325 MG PO TABS
650.0000 mg | ORAL_TABLET | Freq: Four times a day (QID) | ORAL | Status: DC | PRN
  Administered 2011-05-09: 650 mg via ORAL
  Filled 2011-05-08: qty 2

## 2011-05-08 MED ORDER — FINASTERIDE 5 MG PO TABS
5.0000 mg | ORAL_TABLET | Freq: Every evening | ORAL | Status: DC
  Administered 2011-05-09 – 2011-05-15 (×7): 5 mg via ORAL
  Filled 2011-05-08 (×9): qty 1

## 2011-05-08 MED ORDER — SODIUM CHLORIDE 0.9 % IV SOLN
1000.0000 mg | Freq: Every day | INTRAVENOUS | Status: AC
  Administered 2011-05-09 – 2011-05-13 (×5): 1000 mg via INTRAVENOUS
  Filled 2011-05-08 (×6): qty 8

## 2011-05-08 MED ORDER — GADOBENATE DIMEGLUMINE 529 MG/ML IV SOLN
12.0000 mL | Freq: Once | INTRAVENOUS | Status: AC | PRN
  Administered 2011-05-08: 12 mL via INTRAVENOUS

## 2011-05-08 MED ORDER — ROSUVASTATIN CALCIUM 20 MG PO TABS
20.0000 mg | ORAL_TABLET | Freq: Every day | ORAL | Status: DC
  Administered 2011-05-09 – 2011-05-16 (×8): 20 mg via ORAL
  Filled 2011-05-08 (×9): qty 1

## 2011-05-08 MED ORDER — THERA M PLUS PO TABS
1.0000 | ORAL_TABLET | Freq: Every day | ORAL | Status: DC
  Administered 2011-05-09 – 2011-05-16 (×8): 1 via ORAL
  Filled 2011-05-08 (×9): qty 1

## 2011-05-08 MED ORDER — PHENYTOIN SODIUM EXTENDED 100 MG PO CAPS
300.0000 mg | ORAL_CAPSULE | Freq: Every day | ORAL | Status: DC
  Administered 2011-05-08 – 2011-05-15 (×8): 300 mg via ORAL
  Filled 2011-05-08 (×10): qty 3

## 2011-05-08 MED ORDER — PANTOPRAZOLE SODIUM 40 MG PO TBEC
40.0000 mg | DELAYED_RELEASE_TABLET | Freq: Every evening | ORAL | Status: DC
  Administered 2011-05-09 – 2011-05-15 (×7): 40 mg via ORAL
  Filled 2011-05-08 (×7): qty 1

## 2011-05-08 MED ORDER — ASPIRIN 81 MG PO CHEW: 81.0000 mg | CHEWABLE_TABLET | Freq: Every evening | ORAL | Status: DC

## 2011-05-08 MED ORDER — TRAZODONE 25 MG HALF TABLET
25.0000 mg | ORAL_TABLET | Freq: Every evening | ORAL | Status: DC | PRN
  Administered 2011-05-09: 25 mg via ORAL
  Filled 2011-05-08: qty 1

## 2011-05-08 NOTE — H&P (Signed)
Chief Complaint: Weakness and numbness in bilateral upper and lower extremities since later OCt 2012.  Symptoms worsened over last few days.   HPI: Jay Walters is an 77 y.o. ambidextrous male with history of atrial fibrillation, atrial septal defect status post repair, hypercholesterolemia, remote head trauma with subsequent seizure disorder, who developed right leg stumbling and weakness in late October 2012.  He had influenza vaccination prior to onset of symptoms.  In early November 2012 he developed dull pain and numbness across his upper back which radiated to his bilateral shoulders. Symptoms waxed and waned.  His symptoms seem to improve if he laid down on his back. Symptoms worsened when he stood up. He also developed right arm and right leg numbness and weakness. No bowel or bladder incontinence, but he has noticed decreased strength of urinary stream with increased frequency. Initial MRI of the cervical spine showed abnormal T2 hyperintense signal within the spinal cord from C2 through down to C3-4. Initially this was felt to be due to myelomalacia and degenerative spondylosis. Initial screening lab testing with HIV, ACE, RPR, B12, serum copper were unremarkable.  Symptoms were gradually progressive until November 22 when he noticed increasing numbness and weakness in his shoulders and upper arms. This suddenly worsened on Friday, November 23. Symptoms progressively worsened through the weekend at which point he came to see his neurologist in the office today. Due to significantly increased weakness and progression of symptoms, he was sent for MRI scan of the cervical spine which showed progression of T2 hyperintensity within the spinal cord. He is admitted today for further workup and treatment.  Review of Systems  Constitutional: Positive for weight loss. Negative for fever, chills, malaise/fatigue and diaphoresis.  HENT: Negative.  Negative for hearing loss, ear pain, neck pain and tinnitus.    Eyes: Negative for blurred vision and double vision.  Respiratory: Negative for cough and hemoptysis.   Cardiovascular: Negative for chest pain, palpitations, orthopnea, claudication and leg swelling.  Gastrointestinal: Negative for heartburn, nausea, vomiting, abdominal pain, diarrhea and constipation.  Genitourinary: Positive for urgency and frequency.  Musculoskeletal: Positive for back pain and joint pain. Negative for myalgias.  Skin: Negative for itching and rash.  Neurological: Positive for tingling, sensory change, focal weakness and weakness. Negative for dizziness and headaches.  Endo/Heme/Allergies: Negative.   Psychiatric/Behavioral: Negative.      Past Medical History  Diagnosis Date  . GERD (gastroesophageal reflux disease)   . Dysphagia     with solids  . Seizures   . Hypertension   . Hiatal hernia   . Esophageal stricture   . Atrial fibrillation   . Diverticulosis   . Osteopenia   . Hemorrhoids     Past Surgical History  Procedure Date  . Asd repair [id number]  . Knee arthroscopy   . Inguinal hernia repair 2011    Right    Family History  Problem Relation Age of Onset  . Heart disease Father     Social History:  reports that he has never smoked. He has never used smokeless tobacco. He reports that he drinks alcohol. He reports that he does not use illicit drugs.  Works at [employer].  Has 2 children.    Allergies: No Known Allergies   Medications Prior to Admission  Medication Sig Dispense Refill  . aspirin 81 MG tablet Take 81 mg by mouth every evening.       Marland Kitchen atorvastatin (LIPITOR) 40 MG tablet Take 40 mg by mouth every  evening.       . Cholecalciferol (VITAMIN D3) 1000 UNITS CAPS Take 2,000 Units by mouth daily.       Marland Kitchen doxazosin (CARDURA) 4 MG tablet Take 4 mg by mouth every evening.       . finasteride (PROSCAR) 5 MG tablet Take 5 mg by mouth every evening.       . Ginseng 250 MG CAPS Take 1 capsule by mouth daily.       . Milk  Thistle-Turmeric (SILYMARIN) CAPS Take 1 capsule by mouth daily.        . naproxen sodium (ANAPROX) 220 MG tablet Take 440 mg by mouth 2 (two) times daily as needed. For pain      . pantoprazole (PROTONIX) 40 MG tablet Take 40 mg by mouth every evening.       . phenytoin (DILANTIN) 100 MG ER capsule Take 300 mg by mouth at bedtime.        Results for orders placed during the hospital encounter of 05/08/11 (from the past 48 hour(s))  BUN     Status: Normal   Collection Time   05/08/11  5:00 PM      Component Value Range Comment   BUN 17  6 - 23 (mg/dL)   CBC     Status: Abnormal   Collection Time   05/08/11  5:00 PM      Component Value Range Comment   WBC 3.9 (*) 4.0 - 10.5 (K/uL)    RBC 4.16 (*) 4.22 - 5.81 (MIL/uL)    Hemoglobin 12.7 (*) 13.0 - 17.0 (g/dL)    HCT 14.7 (*) 82.9 - 52.0 (%)    MCV 91.3  78.0 - 100.0 (fL)    MCH 30.5  26.0 - 34.0 (pg)    MCHC 33.4  30.0 - 36.0 (g/dL)    RDW 56.2  13.0 - 86.5 (%)    Platelets 161  150 - 400 (K/uL)   CREATININE, SERUM     Status: Abnormal   Collection Time   05/08/11  5:00 PM      Component Value Range Comment   Creatinine, Ser 0.92  0.50 - 1.35 (mg/dL)    GFR calc non Af Amer 79 (*) >90 (mL/min)    GFR calc Af Amer >90  >90 (mL/min)    Outside Labs: HIV neg ACE 27 (12-68) RPR non reactive B12 381 (211-946) Serum copper 114 (72-166)  IMAGING 05/08/2011 XRAYS CERVICAL SPINE - FLEXION AND EXTENSION VIEWS ONLY  Significant degenerative changes are identified throughout the cervical spine but most notably at C4-5, C5-6, C6-7.  There is limited range of motion with flexion and extension.  No evidence for translocation.  IMPRESSION:  1.  No evidence for instability with flexion and extension. 2.  Limited range of motion on flexion and extension.  Original Report Authenticated By: Patterson Hammersmith, M.D.   05/08/11 MRI cervical spine - T2 hyperintense signal within the spinal cord from C2 down to C5; faint enhancement; multilevel  degenerative spondylosis with mild spinal stenosis and foraminal narrowing; slight increase in T2 signal compared to prior MRI from 04/26/11.  I reviewed images myself.  Blood pressure 157/66, pulse 45, temperature 97.6 F (36.4 C), temperature source Oral, resp. rate 18, height 5\' 9"  (1.753 m), weight 61.689 kg (136 lb), SpO2 100.00%. Temp:  [97.3 F (36.3 C)-97.6 F (36.4 C)] 97.6 F (36.4 C) (11/26 2200) Pulse Rate:  [45] 45  (11/26 2100) Resp:  [18] 18  (11/26 2100) BP: (  157)/(66) 157/66 mmHg (11/26 2100) SpO2:  [100 %] 100 % (11/26 2100) Weight:  [61.689 kg (136 lb)] 136 lb (61.689 kg) (11/26 2100) GENERAL EXAM: Patient is in no distress  CARDIOVASCULAR: BRADYCARDIA and regular rhythm, no murmurs, no carotid bruits.  NEUROLOGIC: MENTAL STATUS: awake, alert, language fluent, comprehension intact, naming intact CRANIAL NERVE: pupils equal and reactive to light, visual fields full to confrontation, extraocular muscles intact, no nystagmus, facial sensation and strength symmetric, uvula midline, shoulder shrug symmetric, tongue midline. MOTOR: DELTOID 2/5, BICEPS 3/5, BRACHIORADIALIS 3/5, TRICEPS 5/5, WRIST EXT 5/5, FINGER ABDUCTION 5/5, GRIP 5/5.  HIP FLEX 5/5, KNEE EXT/FLEX 5/5, RIGHT FOOT DF 4/5, LEFT FOOT DF 5/5. SENSORY: DECREASED PP TO C4 LEVEL; DECREASED SENS TO ALL MODALITIES IN LLE.  PROPRIO NORMAL.   COORDINATION: finger-nose-finger LIMITED SECONDARY TO WEAKNESS.  Fine finger movements, heel-shin normal. REFLEXES: TRICEPS 2, RIGHT BICEPS TRACE, LEFT BICEPS ABSENT.  NEG HOFFMANS.  TRACE AT KNEES.  ABSENT AT ANKLES.  LEFT TOE UPGOING; RIGHT TOE EQUIVOCAL.   GAIT/STATION: ON BED REST.   ASSESSMENT: 77 year old male with hypercholesterolemia, prior atrial fibrillation, prior atrial septal defect status post repair, with progressive numbness and weakness of bilateral upper and lower extremities in setting of progressive cervical spinal cord lesion.  Broad differential diagnosis  includes autoimmune, inflammatory, infectious, postinfectious, post-vaccine, neoplastic or vascular etiologies.  PLAN: 1. Admit to neurology telemetry 3000 (h/o atrial fibrillation; now with bradycardia) 2. Solumedrol 1000mg  IV daily (empiric treatment for transverse myelitis) 3. Lumbar puncture (cell count and diff, protein, glucose, OCB, ACE, cytology, crypto ag, gram stain, viral cultures) 4. CT chest, abd, pelvis for malignancy screening and sarcoid eval 5. TTE for stroke workup 6. Continue aspirin 7. DVT prophylaxis with SCDs; lovenox after LP 8. GI prophylaxis with PPI 9. Asymptomatic bradycardia (30-40's) --> due to progressive bradycardia and c-spine lesion, will transfer to NICU for close monitoring   Jay Walters 05/08/2011, 10:36 PM

## 2011-05-08 NOTE — Progress Notes (Signed)
PHARMACIST - PHYSICIAN ORDER COMMUNICATION  CONCERNING: P&T Medication Policy on Herbal Medications  DESCRIPTION:  This patient's order for:  Ginseng & Milk Thistle/Tumeric  has been noted.  This product(s) is classified as an "herbal" or natural product. Due to a lack of definitive safety studies or FDA approval, nonstandard manufacturing practices, plus the potential risk of unknown drug-drug interactions while on inpatient medications, the Pharmacy and Therapeutics Committee does not permit the use of "herbal" or natural products of this type within [facility].   ACTION TAKEN: The pharmacy department is unable to verify this order at this time and your patient has been informed of this safety policy. Please reevaluate patient's clinical condition at discharge and address if the herbal or natural product(s) should be resumed at that time.

## 2011-05-08 NOTE — Telephone Encounter (Signed)
Spoke with Annabelle Harman at Dr. Imagene Gurney office.  She spoke with Mr. Mayorquin who said he can't move his hands.  She has a message back to Dr. Sandria Manly regarding this issue.  Also, patient is scheduled to start physical therapy for his gait tomorrow.  Dr. Imagene Gurney office has a referral requested with Dr. Jeral Fruit at [facility] and has not heard back from them as of today.  Annabelle Harman placed another call to [facility] today in follow up to that referral.  Annabelle Harman has spoken with Mr. Castilla and made him aware of all of this personally.  Dr. Lenord Fellers advised of all of the above.

## 2011-05-09 ENCOUNTER — Inpatient Hospital Stay (HOSPITAL_COMMUNITY): Payer: 59

## 2011-05-09 ENCOUNTER — Encounter: Payer: 59 | Admitting: Physical Therapy

## 2011-05-09 ENCOUNTER — Encounter (HOSPITAL_COMMUNITY): Payer: Self-pay | Admitting: Neurology

## 2011-05-09 DIAGNOSIS — I4892 Unspecified atrial flutter: Secondary | ICD-10-CM

## 2011-05-09 DIAGNOSIS — Z8774 Personal history of (corrected) congenital malformations of heart and circulatory system: Secondary | ICD-10-CM

## 2011-05-09 LAB — CSF CELL COUNT WITH DIFFERENTIAL
RBC Count, CSF: 2 /mm3 — ABNORMAL HIGH
Tube #: 1

## 2011-05-09 LAB — BASIC METABOLIC PANEL
BUN: 14 mg/dL (ref 6–23)
GFR calc non Af Amer: 87 mL/min — ABNORMAL LOW (ref >90)
Glucose, Bld: 137 mg/dL — ABNORMAL HIGH (ref 70–99)
Potassium: 3.7 mEq/L (ref 3.5–5.1)

## 2011-05-09 LAB — MRSA PCR SCREENING: MRSA by PCR: NEGATIVE

## 2011-05-09 MED ORDER — HEPARIN SOD (PORCINE) IN D5W 100 UNIT/ML IV SOLN
1250.0000 [IU]/h | INTRAVENOUS | Status: DC
  Administered 2011-05-09: 950 [IU]/h via INTRAVENOUS
  Administered 2011-05-11 – 2011-05-12 (×2): 1200 [IU]/h via INTRAVENOUS
  Administered 2011-05-13 – 2011-05-16 (×5): 1250 [IU]/h via INTRAVENOUS
  Filled 2011-05-09 (×14): qty 250

## 2011-05-09 MED ORDER — IOHEXOL 350 MG/ML SOLN
50.0000 mL | Freq: Once | INTRAVENOUS | Status: AC | PRN
  Administered 2011-05-09: 50 mL via INTRAVENOUS

## 2011-05-09 MED ORDER — IOHEXOL 300 MG/ML  SOLN
100.0000 mL | Freq: Once | INTRAMUSCULAR | Status: AC | PRN
  Administered 2011-05-09: 100 mL via INTRAVENOUS

## 2011-05-09 MED ORDER — SODIUM CHLORIDE 0.9 % IV SOLN
250.0000 mL | INTRAVENOUS | Status: DC | PRN
  Administered 2011-05-15: 1000 mL via INTRAVENOUS

## 2011-05-09 MED ORDER — ASPIRIN 81 MG PO CHEW
324.0000 mg | CHEWABLE_TABLET | Freq: Every evening | ORAL | Status: DC
  Administered 2011-05-09: 324 mg via ORAL
  Administered 2011-05-10: 81 mg via ORAL
  Administered 2011-05-11 – 2011-05-15 (×5): 324 mg via ORAL
  Filled 2011-05-09: qty 1
  Filled 2011-05-09 (×5): qty 4
  Filled 2011-05-09: qty 1
  Filled 2011-05-09: qty 4

## 2011-05-09 NOTE — Consult Note (Signed)
Discussed patient's CT angiogram of the neck with radiology. He appears to have clots sitting in bilateral carotid arteries. Coupled with a-flutter, I feel that benefits of anticoagulation outweigh the risks given very high embolic risk. I have discussed the situation with the patient and he verbalized understanding.   Carmell Austria, MD

## 2011-05-09 NOTE — Consult Note (Signed)
I've discussed the case with radiology in regards to possible testing to see if a more definitive diagnosis is made. The course of the illness is more suggestive of a subacute on chronic disorder whether it is a stroke or a demyelination. Patient got better, but it is unclear if the process began before the steroids where started and if steroids helped to expedite the process in any way. Cardiology is recommending heparin in light of A-flutter, however I want to obtain more evidence that stroke is not acute before anticoagulation. While a CT Neck that is negative for dissection will not rule out an ischemic lesion, it will have ensure that we are not missing and obvious ischemic lesion due to verterbral dissection.. We will obtain a CTA and keep the patient on ASA in the meantime. If CTA is negative, I will likely proceed with heparin given the subacute nature of the lesion and possible improvement with steroids.  Carmell Austria, MD

## 2011-05-09 NOTE — Consult Note (Signed)
ANTICOAGULATION CONSULT NOTE - Initial Consult  Pharmacy Consult for Heparin Indication: atrial fibrillation/flutter  No Known Allergies  Patient Measurements: Height: 5\' 9"  (175.3 cm) Weight: 132 lb 0.9 oz (59.9 kg) IBW/kg (Calculated) : 70.7    Vital Signs: Temp: 98.8 F (37.1 C) (11/27 1937) Temp src: Oral (11/27 1937) BP: 109/46 mmHg (11/27 2000) Pulse Rate: 42  (11/27 2000)  Labs:  Basename 05/09/11 1020 05/08/11 1700  HGB -- 12.7*  HCT -- 38.0*  PLT -- 161  APTT -- --  LABPROT -- --  INR -- --  HEPARINUNFRC -- --  CREATININE 0.74 0.92  CKTOTAL -- --  CKMB -- --  TROPONINI -- --   Estimated Creatinine Clearance: 65.5 ml/min (by C-G formula based on Cr of 0.74).  Medical History: Past Medical History  Diagnosis Date  . GERD (gastroesophageal reflux disease)   . Dysphagia     with solids  . Seizures   . Hypertension   . Hiatal hernia   . Esophageal stricture   . Atrial fibrillation   . Diverticulosis   . Osteopenia   . Hemorrhoids     Medications:  Scheduled:    . aspirin  324 mg Oral QPM  . cholecalciferol  2,000 Units Oral Daily  . doxazosin  4 mg Oral QPM  . finasteride  5 mg Oral QPM  . methylPREDNISolone (SOLU-MEDROL) injection  1,000 mg Intravenous QHS  . multivitamins ther. w/minerals  1 tablet Oral Daily  . pantoprazole  40 mg Oral QPM  . phenytoin  300 mg Oral QHS  . rosuvastatin  20 mg Oral Daily  . DISCONTD: aspirin  81 mg Oral QPM  . DISCONTD: digoxin  62.5 mcg Oral Daily  . DISCONTD: Ginseng  1 capsule Oral Daily  . DISCONTD: SILYMARIN  1 capsule Oral Daily    Assessment: Patient presents with asymptomatic typical appearing atrial flutter with an asymptomatic bradycardic rate. Heparin, no bolus, ordered per protocol.  Goal of Therapy:  Heparin level 0.3-0.7 units/ml   Plan:  Begin Heparin infusion at 950 units/hr. Heparin level, CBC in 8 hours.  Alven Alverio, Elisha Headland, Pharm.D. 05/09/2011 10:23 PM

## 2011-05-09 NOTE — Consult Note (Signed)
Reason for Consult: Atrial flutter  Requesting Physician: Dr Marjory Lies  HPI: This is a 77 y.o. male with a past medical history significant for an ASD repair by Dr. Olga Millers in 1972. He had one episode of atrial fibrillation documented in the early 90s but has had none since. His last EKG in the office did show sinus rhythm with sinus bradycardia with a rate of about 45. He has not been symptomatic with that. He is not suspected of having coronary disease, he had a Myoview in September 2011 that was low risk. Recent echocardiogram done in October 2012 showed good LV function with an EF of 55%. He had mild LVH, mild left atrial dilatation with a left atrial size of 4.0 cm. He last saw Dr. Alanda Amass 03/06/2011. He was doing well at that time. 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 About a month ago he had an episode of tingling in his arms and back. This lasted for about 24 hours and then resolve spontaneously. On Thanksgiving he had  recurrence of the symptoms and they gradually progressed over the weekend until he finally came to the hospital. He was admitted by the neurology service. He was noted to be in atrial flutter with slow ventricular response on admission. MRI is pending. The patient denies any unusual chest pain or shortness of breath. He is unaware of an irregular heart rhythm. He denies any syncopal spells of any tachycardia or palpitations. Were asked to see him in consult for further evaluation.  PMHx:  Past Medical History  Diagnosis Date  . GERD (gastroesophageal reflux disease)   . Dysphagia     with solids  . Seizures   . Hypertension   . Hiatal hernia   . Esophageal stricture   . Atrial fibrillation, no episodes documented since the early 1990's   . Diverticulosis   . Osteopenia   . Hemorrhoids    Past Surgical History  Procedure Date  . Asd repair [id number] 1972  . Knee arthroscopy   . Inguinal hernia repair 2011   Right    FAMHx: Family History  Problem Relation Age of Onset  . 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 He is married and has 2 children.  e has never smoked. He has never used smokeless tobacco. He reports that he drinks alcohol. He reports that he does not use illicit drugs.  ALLERGIES: No Known Allergies  ROS: Pertinent items are noted in HPI. No history of tachycardia or palpatations. No history of GI bleeding. No history of SOB or chest pain though his activity has been limited because of arthritis in his knees. No recent illness or fever.  HOME MEDICATIONS: Prescriptions prior to admission  Medication Sig Dispense Refill  . aspirin 81 MG tablet Take 81 mg by mouth every evening.       Marland Kitchen atorvastatin (LIPITOR) 40 MG tablet Take 40 mg by mouth every evening.       . Cholecalciferol (VITAMIN D3) 1000 UNITS CAPS Take 2,000 Units by mouth daily.       . digoxin (LANOXIN) 0.125 MG tablet Take 62.5 mcg by mouth daily.        Marland Kitchen doxazosin (CARDURA) 4 MG tablet Take 4 mg by mouth every evening.       . finasteride (PROSCAR) 5 MG tablet Take 5 mg by  mouth every evening.       . Ginseng 250 MG CAPS Take 1 capsule by mouth daily.       . Milk Thistle-Turmeric (SILYMARIN) CAPS Take 1 capsule by mouth daily.        . Multiple Vitamins-Minerals (MULTIVITAMINS THER. W/MINERALS) TABS Take 1 tablet by mouth daily.        . naproxen sodium (ANAPROX) 220 MG tablet Take 440 mg by mouth 2 (two) times daily as needed. For pain      . pantoprazole (PROTONIX) 40 MG tablet Take 40 mg by mouth every evening.       . phenytoin (DILANTIN) 100 MG ER capsule Take 300 mg by mouth at bedtime.       . traMADol (ULTRAM) 50 MG tablet Take 100 mg by mouth every 8 (eight) hours as needed. For pain Maximum dose= 8 tablets per day         HOSPITAL MEDICATIONS: I have reviewed the patient's current medications.  VITALS: Blood pressure  135/94, pulse 57, temperature 97.7 F (36.5 C), temperature source Oral, resp. rate 13, height 5\' 9"  (1.753 m), weight 59.9 kg (132 lb 0.9 oz), SpO2 99.00%.  PHYSICAL EXAM: General appearance: alert, cooperative, appears stated age and no distress Neck: no adenopathy, no carotid bruit, no JVD, supple, symmetrical, trachea midline and thyroid not enlarged, symmetric, no tenderness/mass/nodules Lungs: clear to auscultation bilaterally Heart: irregularly irregular rhythm and 1/6 syst murmur LSB Abdomen: soft, non-tender; bowel sounds normal; no masses,  no organomegaly Extremities: extremities normal, atraumatic, no cyanosis or edema Pulses: 2+ and symmetric Skin: Skin color, texture, turgor normal. No rashes or lesions Neurologic: Grossly normal  LABS: Results for orders placed during the hospital encounter of 05/08/11 (from the past 48 hour(s))  PROTEIN AND GLUCOSE, CSF     Status: Abnormal   Collection Time   05/09/11  1:20 AM      Component Value Range Comment   Glucose, CSF 66  43 - 76 (mg/dL)    Total  Protein, CSF 69 (*) 15 - 45 (mg/dL)   CRYPTOCOCCAL ANTIGEN, CSF     Status: Normal   Collection Time   05/09/11  1:20 AM      Component Value Range Comment   Crypto Ag NEGATIVE  NEGATIVE     Cryptococcal Ag Titer NOT INDICATED  NOT INDICATED    CSF CELL COUNT WITH DIFFERENTIAL     Status: Abnormal   Collection Time   05/09/11  1:20 AM      Component Value Range Comment   Tube # 1      Color, CSF COLORLESS  COLORLESS     Appearance, CSF CLEAR  CLEAR     Supernatant NOT INDICATED      RBC Count 2 (*) 0 (/cu mm)    WBC, CSF 0  0 - 5 (/cu mm)    Segmented Neutrophils-CSF TOO FEW TO COUNT, SMEAR AVAILABLE FOR REVIEW  0 - 6 (%)    Other Cells, CSF FEW LYMPHOCYTES, RARE NEUTROPHILS     CSF CULTURE     Status: Normal (Preliminary result)   Collection Time   05/09/11  1:20 AM      Component Value Range Comment   Specimen Description CSF      Special Requests 4CC      Gram Stain         Value: NO WBC SEEN     NO ORGANISMS SEEN     CYTOSPIN SLIDE  Culture PENDING      Report Status PENDING     MRSA PCR SCREENING     Status: Normal   Collection Time   05/09/11  3:09 AM      Component Value Range Comment   MRSA by PCR NEGATIVE  NEGATIVE    GLUCOSE, CAPILLARY     Status: Abnormal   Collection Time   05/09/11  8:43 AM      Component Value Range Comment   Glucose-Capillary 143 (*) 70 - 99 (mg/dL)     IMAGING:  IMPRESSION:  Patchy hyperintensity within the spinal cord from C2-3 through C5  bilaterally. The cord is not enlarged. There is patchy  enhancement within the cord bilaterally.  Differential diagnosis includes subacute cord infarction.  Transverse myelitis is another possibility. Vascular malformation  and cord ischemia is a possibility however no enlarged cord or  neural vessels are present suggesting a vascular malformation.  Neoplasm not considered likely.     IMPRESSION:  1. Atrial flutter with slow VR response. 2. History of asymptomatic Sinus bradycardia 3. BPH 4. ASD repair 1972, nl  2D as an outpatient 5. Dyslipidemia, treated 6. DJD, needs Rt TKR 7. Seizure disorder 8.  Question C-spine myelitis 9. Hx GERD 10. Negative myoview 09/11  RECOMMENDATION: 1. MD to see. Will stop Lanoxin.  Time Spent Directly with Patient: 45 minutes  Aletheia Tangredi K 05/09/2011, 10:33 AM

## 2011-05-09 NOTE — Procedures (Signed)
Lumbar Puncture Procedure Note  Pre-operative Diagnosis: cervical myelopathy; possible transverse myelitis Post-operative Diagnosis: cervical myelopathy; possible transverse myelitis  Indications: Diagnostic  Procedure Details   Consent: Informed consent was obtained. Risks of the procedure were discussed including: infection, bleeding, pain and headache.  The patient was positioned under sterile conditions. Betadine solution and sterile drapes were utilized. A spinal needle was inserted at the L4-5 interspace. Spinal fluid was obtained and sent to the laboratory.  Findings 18 mL of clear spinal fluid was obtained. Opening Pressure: 18cm H2O pressure.  Complications:  None; patient tolerated the procedure well.        Condition: Stable.  Plan Continue bed rest and steroids.

## 2011-05-09 NOTE — Consult Note (Signed)
Subjective: Patient is doing much better. He says tingling in fingertips of both hands and left leg has resolved. Right leg is feeling much stronger now.   Objective: Vital signs in last 24 hours: Temp:  [97.3 F (36.3 C)-97.7 F (36.5 C)] 97.7 F (36.5 C) (11/27 0305) Pulse Rate:  [43-59] 57  (11/27 0800) Resp:  [13-19] 13  (11/27 0800) BP: (132-157)/(62-94) 135/94 mmHg (11/27 0800) SpO2:  [96 %-100 %] 99 % (11/27 0800) Weight:  [59.9 kg (132 lb 0.9 oz)-61.689 kg (136 lb)] 132 lb 0.9 oz (59.9 kg) (11/27 0500)  Intake/Output from previous day: 11/26 0701 - 11/27 0700 In: 1153.8 [P.O.:650; I.V.:503.8] Out: 350 [Urine:350] Intake/Output this shift: Total I/O In: 75 [I.V.:75] Out: 300 [Urine:300] Nutritional status: Cardiac Neuro:   Lab Results:  Basename 05/08/11 1700  WBC 3.9*  HGB 12.7*  HCT 38.0*  PLT 161  NA --  K --  CL --  CO2 --  GLUCOSE --  BUN 17  CREATININE 0.92  CALCIUM --  LABA1C --   Studies/Results: Dg Cerv Spine Flex&ext Only  05/08/2011  *RADIOLOGY REPORT*  Clinical Data: Cervical myelopathy.  Numbness in the neck and arms.  CERVICAL SPINE - FLEXION AND EXTENSION VIEWS ONLY  Comparison: 04/26/2011 MRI  Findings: There is loss of cervical lordosis.  Significant degenerative changes are identified throughout the cervical spine but most notably at C4-5, C5-6, C6-7.  There is limited range of motion with flexion and extension.  No evidence for translocation.  IMPRESSION:  1.  No evidence for instability with flexion and extension. 2.  Limited range of motion on flexion and extension.  Original Report Authenticated By: Patterson Hammersmith, M.D.   Medications: I have reviewed the patient's current medications.  Assessment/Plan: 77 years old man with weakness and numbness in bilateral upper and lower extremities since later OCt 2012. Symptoms worsened over last few days.  1) Will continue steroids for treatment of possible transverse myelitis - patient is  improving 2) Call patient's cardiologist Dr. Alanda Amass for consult regarding bradycardia and A-flutter - they will see him today 3) Stat BMP, MG  LOS: 1 day   Jay Walters

## 2011-05-09 NOTE — Progress Notes (Addendum)
  Echocardiogram 2D Echocardiogram with bubble study has been performed.  Cathie Beams Deneen 05/09/2011, 1:09 PM

## 2011-05-09 NOTE — Consult Note (Signed)
Electrophysiology Consult Note  Patient ID: Jay Walters MRN: [id number], DOB/AGE: [birth year]   Admit date: 05/08/2011 Date of Consult: 05/09/2011 3:48 PM  Primary Physician: [name], MD, MD Primary Cardiologist: Dr Alanda Amass  Pt. Profile: 79yom w/ PMHx significant for HTN, ASD repair (1972), remote afib (1990s w/ no episodes documented since) w/out evidence of CAD by [facility] 2011, echo 10/12 showed good LV function w/ EF 55%, mild LVH, mild LA dilatation w/ LA size 4.0cm who presented to [facility] on 05/08/11 with tingling in his arms and back and was found to be in atrial flutter with slow ventricular response.   Past Medical History  Diagnosis Date  . GERD (gastroesophageal reflux disease)   . Dysphagia     with solids  . Seizures   . Hypertension   . Hiatal hernia   . Esophageal stricture   . Atrial fibrillation   . Diverticulosis   . Osteopenia   . Hemorrhoids     Past Surgical History  Procedure Date  . Asd repair [id number]  . Knee arthroscopy   . Inguinal hernia repair 2011    Right     Allergies: No Known Allergies  HPI:  Jay Walters is a 77 y.o. male with a past medical history significant for an ASD repair by Dr. Olga Millers in [phone] and remote atrial fibrillation.  Per report, he had one episode of atrial fibrillation documented in the early 90s but has had none since. His last EKG in the office did show sinus rhythm with sinus bradycardia with a rate of about 45.  He developed right leg stumbling and weakness in late October 2012. In early November 2012 he developed dull severe pain and numbness across his upper back which radiated to his bilateral shoulders. He also developed right arm and right leg numbness and weakness.  An initial MRI of the cervical spine showed abnormal T2 hyperintense signal within the spinal cord from C2 through down to C3-4. Initially this was felt to be due to myelomalacia and degenerative spondylosis.  Symptoms were gradually progressive  until November 22 when he noticed increasing numbness and weakness in his shoulders and upper arms. This suddenly worsened on Friday, November 23.  He was evaluated by Dr Sandria Manly and sent for MRI scan of the cervical spine which showed progression of T2 hyperintensity within the spinal cord. He is admitted for further workup and treatment.  During evaluate, he was observed to be in atrial flutter.  His ventricular rate is 40s.  The patient is unaware of any symptoms with his atrial flutter.  He denies chest pain, SOB, palpitations, dizziness, presyncope, or syncope.  He does reports significant fatigue but is unclear as to whether this is related to his atrial flutter.  Outpatient Medications:  Medication Sig  aspirin 81 MG tablet Take 81 mg by mouth every evening.   atorvastatin (LIPITOR) 40 MG tablet Take 40 mg by mouth every evening.   Cholecalciferol (VITAMIN D3) 1000 UNITS CAPS Take 2,000 Units by mouth daily.   digoxin (LANOXIN) 0.125 MG tablet Take 62.5 mcg by mouth daily.    doxazosin (CARDURA) 4 MG tablet Take 4 mg by mouth every evening.   finasteride (PROSCAR) 5 MG tablet Take 5 mg by mouth every evening.   Ginseng 250 MG CAPS Take 1 capsule by mouth daily.   Milk Thistle-Turmeric (SILYMARIN) CAPS Take 1 capsule by mouth daily.    Multiple Vitamins-Minerals (MULTIVITAMINS THER. W/MINERALS) TABS Take 1 tablet by mouth daily.  naproxen sodium (ANAPROX) 220 MG tablet Take 440 mg by mouth 2 (two) times daily as needed. For pain  pantoprazole (PROTONIX) 40 MG tablet Take 40 mg by mouth every evening.   phenytoin (DILANTIN) 100 MG ER capsule Take 300 mg by mouth at bedtime.   traMADol (ULTRAM) 50 MG tablet Take 100 mg by mouth every 8 (eight) hours as needed. For pain Maximum dose= 8 tablets per day     Inpatient Medications:   . aspirin  324 mg Oral QPM  . cholecalciferol  2,000 Units Oral Daily  . doxazosin  4 mg Oral QPM  . finasteride  5 mg Oral QPM  . methylPREDNISolone  (SOLU-MEDROL) injection  1,000 mg Intravenous QHS  . multivitamins ther. w/minerals  1 tablet Oral Daily  . pantoprazole  40 mg Oral QPM  . phenytoin  300 mg Oral QHS  . rosuvastatin  20 mg Oral Daily  . DISCONTD: aspirin  81 mg Oral QPM  . DISCONTD: digoxin  62.5 mcg Oral Daily    Family History  Problem Relation Age of Onset  . Heart disease Father      History   Social History  . Marital Status: Married    Spouse Name: N/A    Number of Children: N/A  . Years of Education: N/A   Occupational History  . Not on file.   Social History Main Topics  . Smoking status: Never Smoker   . Smokeless tobacco: Never Used  . Alcohol Use: 0.0 oz/week    0.25 drink(s) per week  . Drug Use: No  . Sexually Active: Not on file   Other Topics Concern  . Not on file   Social History Narrative  . No narrative on file     Review of Systems: All systems reviewed and are otherwise negative except as noted above.  Physical Exam: Temp:  [97.3 F (36.3 C)-98.7 F (37.1 C)] 98.7 F (37.1 C) (11/27 1200) Pulse Rate:  [43-70] 67  (11/27 1400) Resp:  [13-22] 22  (11/27 1400) BP: (122-171)/(61-114) 122/61 mmHg (11/27 1400) SpO2:  [95 %-100 %] 97 % (11/27 1400) Weight:  [59.9 kg (132 lb 0.9 oz)-61.689 kg (136 lb)] 132 lb 0.9 oz (59.9 kg) (11/27 0500)    General: Well developed, well nourished, in no acute distress. Head: Normocephalic, atraumatic, sclera non-icteric, no xanthomas, nares are without discharge.  Neck: Supple. Negative for carotid bruits. No JVD. Lungs: Clear bilaterally to auscultation without wheezes, rales, or rhonchi. Breathing is unlabored. Heart: irregular bradycardic rhythm, no m/r/g. Abdomen: Soft, non-tender, non-distended with normoactive bowel sounds. No hepatomegaly. No rebound/guarding. No obvious abdominal masses. Msk:  4/5 strength both arm flexors/ extensors Extremities: No clubbing, cyanosis or edema.  Distal pedal pulses are 2+ and equal  bilaterally. Neuro: significant reduction in bilateral arm strength, + subjective "tingling of both arms and hands" Psych:  Responds to questions appropriately with a normal affect.  Labs:   Lab Results  Component Value Date   WBC 3.9* 05/08/2011   HGB 12.7* 05/08/2011   HCT 38.0* 05/08/2011   MCV 91.3 05/08/2011   PLT 161 05/08/2011     Lab 05/09/11 1020  NA 138  K 3.7  CL 104  CO2 25  BUN 14  CREATININE 0.74  CALCIUM 8.7  GLUCOSE 137*   X rays, MRIs, and CTs reviewed  RUE:[id number] appearing atrial flutter, V rate 46 bpm, nonspecific ST/T changes Echo- EF 60%, moderate biatrial dilitation  ASSESSMENT AND PLAN:  Mr Birdsall presents  with severe neck/ upper back pain and numbness/ decreased strength in his arms and hands.  He has significant abnormality on MRI with workup ongoing by neurology.  Given the prolonged/ waxing and waning aspect of the episodes, acute embolic event seems less likely.  He also presents with asymptomatic typical appearing atrial flutter with an asymptomatic bradycardic rate.  At this time, I think that our primary focus should be on the etiology and management of his neurologic process.  I would continue to follow his rate controlled atrial flutter for now.  I agree with discontinuing digoxin.  Once cleared by neurology, I would favor initiation of coumadin for stroke prevention at that time.  I would defer the consideration of cardioversion/ ablation until his neurologic issues have been completely addressed.  He does not have an indication for pacemaker implantation.  I will follow with you during his hospital stay.  Hillis Range, MD 05/09/2011 4:44 PM

## 2011-05-09 NOTE — Consult Note (Addendum)
Pt. Seen and examined. Agree with the NP/PA-C note as written. He is unaware of his atrial flutter with slow ventricular response. It appears to be typical caval-isthmus dependent flutter.  I agree with holding digoxin for now. He has a known 1st degree AV block and likely has underlying sinus node dysfunction. Given his mild to moderate atrial enlargement and likely atrial septal scar from prior ASD repair, it may be difficult to keep him in sinus rhythm without antiarrythmic medication. He may be an ideal candidate for flutter ablation. Will review 2D echo today. Will contact EP for evaluation. He will likely need a TEE at some point, but it should be noted he has a history of dysphagia to solids in the past. Also, his CHADS2 score is 4 (considering this event as a spinal stroke) .Marland Kitchen I would recommend starting anticoagulation with heparin. We could consider pradaxa or xarelto, depending on cost, for maintenance.  Addendum: Discussed with Dr. Lyman Speller (neurology) regarding anticoagulation. He is still uncertain whether the patient has had spinal cord stroke vs. Myelopathy for another reason. Given concern for hemorrhagic conversion, he does not want to start heparin at this time. I recommend continuing aspirin and starting heparin per pharmacy (no bolus) when stroke has been excluded or when the patient is at lower risk for hemorrhagic transformation.  I have consulted EP for evaluation of ablation.   Chrystie Nose, MD Attending Cardiologist [facility]

## 2011-05-10 LAB — CBC
Hemoglobin: 12 g/dL — ABNORMAL LOW (ref 13.0–17.0)
MCH: 30.2 pg (ref 26.0–34.0)
Platelets: 149 10*3/uL — ABNORMAL LOW (ref 150–400)
RBC: 3.98 MIL/uL — ABNORMAL LOW (ref 4.22–5.81)
WBC: 6.8 10*3/uL (ref 4.0–10.5)

## 2011-05-10 LAB — HEPARIN LEVEL (UNFRACTIONATED): Heparin Unfractionated: 0.62 IU/mL (ref 0.30–0.70)

## 2011-05-10 LAB — VDRL, CSF: VDRL Quant, CSF: NONREACTIVE

## 2011-05-10 NOTE — Consult Note (Addendum)
ANTICOAGULATION CONSULT NOTE - Follow Up Consult  Pharmacy Consult for Heparin Indication: atrial fibrillation/flutter and possible clots in carotid arteries  No Known Allergies  Patient Measurements: Height: 5\' 9"  (175.3 cm) Weight: 138 lb 7.2 oz (62.8 kg) IBW/kg (Calculated) : 70.7    Vital Signs: Temp: 98.8 F (37.1 C) (11/28 0309) Temp src: Oral (11/28 0309) BP: 120/64 mmHg (11/28 0700) Pulse Rate: 50  (11/28 0700)  Labs:  Basename 05/10/11 0640 05/09/11 1020 05/08/11 1700  HGB 12.0* -- 12.7*  HCT 35.9* -- 38.0*  PLT 149* -- 161  APTT -- -- --  LABPROT -- -- --  INR -- -- --  HEPARINUNFRC 0.11* -- --  CREATININE -- 0.74 0.92  CKTOTAL -- -- --  CKMB -- -- --  TROPONINI -- -- --   Estimated Creatinine Clearance: 68.7 ml/min (by C-G formula based on Cr of 0.74).  Medical History: Past Medical History  Diagnosis Date  . GERD (gastroesophageal reflux disease)   . Dysphagia     with solids  . Seizures   . Hypertension   . Hiatal hernia   . Esophageal stricture   . Atrial fibrillation   . Diverticulosis   . Osteopenia   . Hemorrhoids     Medications:  Scheduled:     . aspirin  324 mg Oral QPM  . cholecalciferol  2,000 Units Oral Daily  . doxazosin  4 mg Oral QPM  . finasteride  5 mg Oral QPM  . methylPREDNISolone (SOLU-MEDROL) injection  1,000 mg Intravenous QHS  . multivitamins ther. w/minerals  1 tablet Oral Daily  . pantoprazole  40 mg Oral QPM  . phenytoin  300 mg Oral QHS  . rosuvastatin  20 mg Oral Daily  . DISCONTD: digoxin  62.5 mcg Oral Daily    Assessment: 77 yo male with atrial flutter and possible clots in bilateral carotid arteries per MD note. Heparin level is below-goal. No problem with line per RN.  Goal of Therapy:  Heparin level 0.3-0.7 units/ml   Plan:  1. Increase heparin IV infusion to 1200 units/hr.  2. Heparin level 8 hours after rate change.   Thad Ranger, Mellody Drown, Pharm.D. 05/10/2011 8:24 AM

## 2011-05-10 NOTE — Progress Notes (Signed)
Utilization review completed. Lorry Furber, RN, BSN. 05/10/11  

## 2011-05-10 NOTE — Progress Notes (Signed)
Subjective: Patient reports no pain today, rating his back and shoulder pain at a 0/10.  Continues to have some weakness that he reports is better than at his initial presentation but is not improved per his report from yesterday.  Patient has been started on heparin.  Continues steroids and now s/p two infusions with five scheduled.  Tolerating meds without noted side effects.  Objective: Vital signs in last 24 hours: Temp:  [98 F (36.7 C)-98.8 F (37.1 C)] 98.6 F (37 C) (11/28 0800) Pulse Rate:  [37-69] 58  (11/28 0900) Resp:  [5-24] 17  (11/28 0900) BP: (91-171)/(44-114) 146/107 mmHg (11/28 0900) SpO2:  [96 %-98 %] 98 % (11/28 0900) Weight:  [62.8 kg (138 lb 7.2 oz)] 138 lb 7.2 oz (62.8 kg) (11/28 0500)  Intake/Output from previous day: 11/27 0701 - 11/28 0700 In: 2937.4 [P.O.:1060; I.V.:1877.4] Out: 1330 [Urine:1330] Intake/Output this shift: Total I/O In: 530.5 [P.O.:360; I.V.:170.5] Out: 225 [Urine:225] Nutritional status: Cardiac  Neurologic Exam: Mental Status: Alert, oriented, thought content appropriate.  Speech fluent without evidence of aphasia.  Able to follow 3 step commands without difficulty. Cranial Nerves: II: visual fields grossly normal, pupils equal, round, reactive to light and accommodation III,IV, VI: ptosis not present, extraocular muscles extra-ocular motions intact bilaterally V,VII: smile symmetric, facial light touch sensation normal bilaterally VIII: hearing normal bilaterally IX,X: gag reflex present XI: trapezius strength decreased bilaterally XII: tongue strength normal  Motor: Right Upper extremity - 5/5 hand grip  with 3+/5 strength proximally.  Patient able to lift arm to about 135 degrees without assistance.    Left Upper extremity - 5/5 hand grip with 3/5 strength proximally.  Patient able to lift arm to about 75 degrees without assistance Right Lower extremity -  5/5 plantar flexion and extension, 4/5 proximally with patient able to lift  leg easily 45 degrees Left Lower extremity - 5/5 plantar flexion and extension, 4-/5 proximally with patient able to lift leg easily to 45 degrees Tone and bulk:normal tone throughout; no atrophy noted Sensory: Pinprick and light touch intact throughout in the lower extremities, decreased in the upper extremities bilaterally Deep Tendon Reflexes: 1+ at the biceps and absent otherwise Plantars: Right: mute   Left: mute Cerebellar: Not performed   Lab Results:  Basename 05/10/11 0640 05/09/11 1020 05/08/11 1700  WBC 6.8 -- 3.9*  HGB 12.0* -- 12.7*  HCT 35.9* -- 38.0*  PLT 149* -- 161  NA -- 138 --  K -- 3.7 --  CL -- 104 --  CO2 -- 25 --  GLUCOSE -- 137* --  BUN -- 14 17  CREATININE -- 0.74 0.92  CALCIUM -- 8.7 --  LABA1C -- -- --   Lipid Panel No results found for this basename: CHOL,TRIG,HDL,CHOLHDL,VLDL,LDLCALC in the last 72 hours  Studies/Results: Ct Angio Neck W/cm &/or Wo/cm  05/10/2011  *RADIOLOGY REPORT*  Clinical Data:  Possible infarct.  High blood pressure.  Seizures.  CT ANGIOGRAPHY NECK  Technique:  Multidetector CT imaging of the neck was performed using the standard protocol during bolus administration of intravenous contrast.  Multiplanar CT image reconstructions including MIPs were obtained to evaluate the vascular anatomy. Carotid stenosis measurements (when applicable) are obtained utilizing NASCET criteria, using the distal internal carotid diameter as the denominator.  Contrast: 50mL OMNIPAQUE IOHEXOL 350 MG/ML IV SOLN  Comparison:  04/26/2011 MR brain.05/08/2011 04/26/2011 cervical spine MR.  Findings:  Abnormal appearance of the carotid bifurcation bilaterally.  Low-level peripheral low density material is noted within the carotid  bifurcation bilaterally most prominent in the right carotid bulb.  Appearance is atypical raising possibility of artifact, however in retrospect, abnormality at this level is noted on the axial images of recent cervical spine MRs and  therefore this is felt to represent a true finding raising the possibility of thrombus and / or atherosclerotic plaque. Less than 50% diameter stenosis of the proximal internal carotid artery bilaterally.  Abnormal appearance of the distal vertical cervical segment of the right internal carotid artery just proximal to the skull base with a pleated appearance raising possibility of fibromuscular dysplasia.  No high-grade stenosis of either vertebral artery.  At the C2 level, there is a slightly pleated appearance of the left vertebral artery raising possibility of fibromuscular dysplasia.  Cervical spondylotic changes.  Please see recent MR report.  Biapical pleural thickening without associated bony destruction. Bilateral upper lobe parenchymal changes.  Follow up as noted on recent chest CT.  Mild asymmetry palatine tonsils and piriform sinus with slight fullness/underfilling on the left respectively.  This may be normal although mucosal abnormality cannot be excluded.  Scattered normal sized lymph nodes.   Review of the MIP images confirms the above findings.  IMPRESSION: Thrombus and / or atherosclerotic plaque carotid bifurcation bilaterally with less than 50% diameter stenosis of the proximal internal carotid artery bilaterally.  Findings raise possibility fibromuscular dysplasia involving the distal vertical segment of the right internal carotid artery and the left vertebral artery at the C2 level.  Cervical spondylotic changes.  Please see recent MR report.  Biapical pleural thickening without associated bony destruction. Bilateral upper lobe parenchymal changes.  Follow up as noted on recent chest CT.  Mild asymmetry palatine tonsils and piriform sinus with slight fullness/underfilling on the left respectively.  This may be normal although mucosal abnormality cannot be excluded.  Preliminary report at the time imaging by Dr. Pecolia Ades was called to Dr. Imagene Gurney office.  Original Report Authenticated By: Fuller Canada, M.D.   Ct Chest W Contrast  05/09/2011  *RADIOLOGY REPORT*  Clinical Data:  Progressive cervical cord lesion.  Evaluate for malignancy or sarcoidosis.  History of remote aortic surgery.  CT CHEST, ABDOMEN AND PELVIS WITH CONTRAST  Technique:  Multidetector CT imaging of the chest, abdomen and pelvis was performed following the standard protocol during bolus administration of intravenous contrast.  Contrast: OMNIPAQUE IOHEXOL 300 MG/ML IV SOLN  Comparison:  Cervical MRI 04/26/2011 and 05/08/2011.  Pelvic CT 01/17/2010.  CT CHEST  Findings:  There is central enlargement of the pulmonary arteries consistent with pulmonary arterial hypertension.  The vessels are well opacified without evidence of pulmonary embolism.  The thoracic aorta appears normal.  There are no enlarged mediastinal or hilar lymph nodes.  There is no pleural or pericardial effusion.  Mild chronic lung disease is present with mild emphysema.  Linear scarring is present within the right middle lobe and lingula. Focal ground-glass opacities are demonstrated in both upper lobes, seen in the right upper lobe on images 22 and 31 and in the left upper lobe on images 21 and 22.  These do not have any significant solid components.  There is no endobronchial lesion or central thickening of the bronchovascular bundles.  Moderate convex right thoracolumbar scoliosis is noted. There is congenital or post-traumatic deformity of the right fourth and fifth ribs.  IMPRESSION:  1.  No specific signs of sarcoidosis are demonstrated within the chest. 2.  Pulmonary arterial hypertension. 3.  Mild chronic lung disease with scattered focal ground-glass  opacities in both upper lobes.  These may be post inflammatory in etiology.  Atypical neoplasm (low grade adenocarcinoma) can have this appearance. CT followup in 3-6 months recommended.  This recommendation is taken from the following article:  Subsolid Pulmonary Nodules and the Spectrum of Peripheral  Adenocarcinomas of the Lung:  Recommended Interim Guidelines for Assessment and Management.  Radiology December 2009; [id number].  CT ABDOMEN AND PELVIS  Findings:  The liver, gallbladder, biliary system and pancreas appear normal.  The spleen appears normal.  There is no adrenal mass.  There are numerous renal parapelvic cysts bilaterally. There is no hydronephrosis or delay in contrast excretion.  There is no evidence of renal mass.  There is aorto iliac atherosclerosis without evidence of aneurysm or large vessel occlusion.  No enlarged abdominal pelvic lymph nodes are identified.  Urinary bladder is mildly distended.  Prostate gland is moderately enlarged, not significantly changed from the prior study. Moderately advanced facet disease is present within the lower lumbar spine with resulting foraminal stenosis on the right at L4- L5.  There are no acute osseous findings. Bone island in the left iliac bone is stable.  IMPRESSION:  1.  No evidence of intra-abdominal malignancy. 2.  Stable enlargement of the prostate gland. 3.  Bilateral renal parapelvic cysts. 4.  Lower lumbar spine facet disease and thoracolumbar scoliosis.  Original Report Authenticated By: Gerrianne Scale, M.D.   Mr Cervical Spine W Wo Contrast  05/09/2011  *RADIOLOGY REPORT*  Clinical Data: Cervical myelopathy, progressive over the last several days.  MRI CERVICAL SPINE WITHOUT AND WITH CONTRAST  Technique:  Multiplanar and multiecho pulse sequences of the cervical spine, to include the craniocervical junction and cervicothoracic junction, were obtained according to standard protocol without and with intravenous contrast.  Contrast: 12mL MULTIHANCE GADOBENATE DIMEGLUMINE 529 MG/ML IV SOLN  Comparison: Cervical MRI 04/26/2011  Findings: There is abnormal signal within the spinal cord.  There is patchy hyperintensity within the cord bilaterally beginning at the C2-3 level extending to C5.  There appears to be slight progression of signal  abnormality the left side of the cord compared the prior study.  The largest area of hyperintensity in the cord on the right at the C3 level on the prior study actually shows slight improvement.  The cord is not enlarged.  No cord hemorrhage is identified.  Axial images are degraded by motion making it difficult to determine the exact location of the signal abnormality however this appears to be in the central cord bilaterally.  Postcontrast images reveal patchy enhancement within the cord bilaterally from C2-3 through C5.  This again appears to be in the central cord bilaterally.  I do not detect any enlarged intramedullary or dural based vessels suggesting vascular malformation.  This does not appear to represent a mass or tumor given lack of enlargement of the cord and the patchy signal abnormality.  Mild cervical kyphosis is present.  Disc degeneration and spondylosis are present throughout the cervical spine.  This causes mild spinal stenosis at C2-3, C3-4, C4-5 on the left, C5-6, and C6- 7.  This is unchanged from the  prior study.  While there is spinal stenosis and mild cord deformity, the signal abnormality in the cord would appear to be due to a separate and acute process rather than chronic myelomalacia from spinal stenosis.  IMPRESSION: Patchy hyperintensity within the spinal cord from C2-3 through C5 bilaterally.  The cord is not enlarged.  There is patchy enhancement within the cord bilaterally.  Differential diagnosis includes subacute cord infarction. Transverse myelitis is another possibility.  Vascular malformation and cord ischemia is a possibility however no enlarged cord or neural vessels are present suggesting a vascular malformation. Neoplasm not considered likely.  Critical Value/emergent results were called by telephone at the time of interpretation on 05/08/2011  at 1945 hours  to  Dr. Sandria Manly, who verbally acknowledged these results.  Original Report Authenticated By: Camelia Phenes, M.D.    Ct Abdomen Pelvis W Contrast  05/09/2011  *RADIOLOGY REPORT*  Clinical Data:  Progressive cervical cord lesion.  Evaluate for malignancy or sarcoidosis.  History of remote aortic surgery.  CT CHEST, ABDOMEN AND PELVIS WITH CONTRAST  Technique:  Multidetector CT imaging of the chest, abdomen and pelvis was performed following the standard protocol during bolus administration of intravenous contrast.  Contrast: OMNIPAQUE IOHEXOL 300 MG/ML IV SOLN  Comparison:  Cervical MRI 04/26/2011 and 05/08/2011.  Pelvic CT 01/17/2010.  CT CHEST  Findings:  There is central enlargement of the pulmonary arteries consistent with pulmonary arterial hypertension.  The vessels are well opacified without evidence of pulmonary embolism.  The thoracic aorta appears normal.  There are no enlarged mediastinal or hilar lymph nodes.  There is no pleural or pericardial effusion.  Mild chronic lung disease is present with mild emphysema.  Linear scarring is present within the right middle lobe and lingula. Focal ground-glass opacities are demonstrated in both upper lobes, seen in the right upper lobe on images 22 and 31 and in the left upper lobe on images 21 and 22.  These do not have any significant solid components.  There is no endobronchial lesion or central thickening of the bronchovascular bundles.  Moderate convex right thoracolumbar scoliosis is noted. There is congenital or post-traumatic deformity of the right fourth and fifth ribs.  IMPRESSION:  1.  No specific signs of sarcoidosis are demonstrated within the chest. 2.  Pulmonary arterial hypertension. 3.  Mild chronic lung disease with scattered focal ground-glass opacities in both upper lobes.  These may be post inflammatory in etiology.  Atypical neoplasm (low grade adenocarcinoma) can have this appearance. CT followup in 3-6 months recommended.  This recommendation is taken from the following article:  Subsolid Pulmonary Nodules and the Spectrum of Peripheral  Adenocarcinomas of the Lung:  Recommended Interim Guidelines for Assessment and Management.  Radiology December 2009; [id number].  CT ABDOMEN AND PELVIS  Findings:  The liver, gallbladder, biliary system and pancreas appear normal.  The spleen appears normal.  There is no adrenal mass.  There are numerous renal parapelvic cysts bilaterally. There is no hydronephrosis or delay in contrast excretion.  There is no evidence of renal mass.  There is aorto iliac atherosclerosis without evidence of aneurysm or large vessel occlusion.  No enlarged abdominal pelvic lymph nodes are identified.  Urinary bladder is mildly distended.  Prostate gland is moderately enlarged, not significantly changed from the prior study. Moderately advanced facet disease is present within the lower lumbar spine with resulting foraminal stenosis on the right at L4- L5.  There are no acute osseous findings. Bone island in the left iliac bone is stable.  IMPRESSION:  1.  No evidence of intra-abdominal malignancy. 2.  Stable enlargement of the prostate gland. 3.  Bilateral renal parapelvic cysts. 4.  Lower lumbar spine facet disease and thoracolumbar scoliosis.  Original Report Authenticated By: Gerrianne Scale, M.D.   Dg Cerv Spine Flex&ext Only  05/08/2011  *RADIOLOGY REPORT*  Clinical Data: Cervical myelopathy.  Numbness  in the neck and arms.  CERVICAL SPINE - FLEXION AND EXTENSION VIEWS ONLY  Comparison: 04/26/2011 MRI  Findings: There is loss of cervical lordosis.  Significant degenerative changes are identified throughout the cervical spine but most notably at C4-5, C5-6, C6-7.  There is limited range of motion with flexion and extension.  No evidence for translocation.  IMPRESSION:  1.  No evidence for instability with flexion and extension. 2.  Limited range of motion on flexion and extension.  Original Report Authenticated By: Patterson Hammersmith, M.D.    Medications:  I have reviewed the patient's current medications. Scheduled:     . aspirin  324 mg Oral QPM  . cholecalciferol  2,000 Units Oral Daily  . doxazosin  4 mg Oral QPM  . finasteride  5 mg Oral QPM  . methylPREDNISolone (SOLU-MEDROL) injection  1,000 mg Intravenous QHS  . multivitamins ther. w/minerals  1 tablet Oral Daily  . pantoprazole  40 mg Oral QPM  . phenytoin  300 mg Oral QHS  . rosuvastatin  20 mg Oral Daily  . DISCONTD: digoxin  62.5 mcg Oral Daily    Assessment/Plan:  Patient Active Hospital Problem List: Cervical myelopathy (05/08/2011)-likely transverse myelitis   Assessment: Patient no longer with pain but continues to have significant proximal weakness in all extremities   Plan: 1. Continue on steroids for three more treatments             2. Urinating frequently.  Concerned about retention. Bladder scan today.             3. Many results of LP remain pending-will continue to follow up. Only significant results currently are elevated protein.  Culture negative.    Atrial flutter, bradycardia (04/18/2011)   Assessment: Cardiology following patient    Plan: 1. Continue heparin as per cardiology Carotid thrombus-bilateral   Assessment-seen on CTA, does not seem to be symptomatic   Plan: 1. Patient on heparin    LOS: 2 days   Thana Farr, MD [facility] [phone] 05/10/2011  10:12 AM

## 2011-05-10 NOTE — Consult Note (Signed)
ANTICOAGULATION CONSULT NOTE - Follow Up Consult  Pharmacy Consult for Heparin Indication: atrial fibrillation/flutter and possible clots in carotid arteries  No Known Allergies  Patient Measurements: Height: 5\' 9"  (175.3 cm) Weight: 138 lb 7.2 oz (62.8 kg) IBW/kg (Calculated) : 70.7    Vital Signs: Temp: 97.6 F (36.4 C) (11/28 1601) Temp src: Oral (11/28 1601) BP: 132/76 mmHg (11/28 1400) Pulse Rate: 69  (11/28 1400)  Labs:  Basename 05/10/11 1600 05/10/11 0640 05/09/11 1020 05/08/11 1700  HGB -- 12.0* -- 12.7*  HCT -- 35.9* -- 38.0*  PLT -- 149* -- 161  APTT -- -- -- --  LABPROT -- -- -- --  INR -- -- -- --  HEPARINUNFRC 0.62 0.11* -- --  CREATININE -- -- 0.74 0.92  CKTOTAL -- -- -- --  CKMB -- -- -- --  TROPONINI -- -- -- --   Estimated Creatinine Clearance: 68.7 ml/min (by C-G formula based on Cr of 0.74).  Medical History: Past Medical History  Diagnosis Date  . GERD (gastroesophageal reflux disease)   . Dysphagia     with solids  . Seizures   . Hypertension   . Hiatal hernia   . Esophageal stricture   . Atrial fibrillation   . Diverticulosis   . Osteopenia   . Hemorrhoids     Medications:  Scheduled:     . aspirin  324 mg Oral QPM  . cholecalciferol  2,000 Units Oral Daily  . doxazosin  4 mg Oral QPM  . finasteride  5 mg Oral QPM  . methylPREDNISolone (SOLU-MEDROL) injection  1,000 mg Intravenous QHS  . multivitamins ther. w/minerals  1 tablet Oral Daily  . pantoprazole  40 mg Oral QPM  . phenytoin  300 mg Oral QHS  . rosuvastatin  20 mg Oral Daily    Assessment: 77 yo male with atrial flutter and possible clots in bilateral carotid arteries per MD note. Heparin level is now therapeutic.  Goal of Therapy:  Heparin level 0.3-0.7 units/ml   Plan:  1. Continue heparin IV infusion at 1200 units/hr.  2. Repeat Heparin level in 8 hours to confirm therapeutic anticoagulation per protocol.   Norma Ignasiak K. Allena Katz, PharmD, BCPS.  Clinical  Pharmacist Pager [phone]. 05/10/2011 6:08 PM

## 2011-05-10 NOTE — Progress Notes (Signed)
Subjective:  Less back pain  Objective:  Vital Signs in the last 24 hours: Temp:  [98 F (36.7 C)-98.8 F (37.1 C)] 98.8 F (37.1 C) (11/28 0309) Pulse Rate:  [37-70] 50  (11/28 0700) Resp:  [5-24] 18  (11/28 0700) BP: (91-171)/(44-114) 120/64 mmHg (11/28 0700) SpO2:  [95 %-98 %] 97 % (11/28 0700) Weight:  [62.8 kg (138 lb 7.2 oz)] 138 lb 7.2 oz (62.8 kg) (11/28 0500)  Intake/Output from previous day: 11/27 0701 - 11/28 0700 In: 2937.4 [P.O.:1060; I.V.:1877.4] Out: 1330 [Urine:1330]  Physical Exam: General appearance: alert, cooperative and no distress Lungs: clear to auscultation bilaterally Heart: irregularly irregular rhythm   Rate: 50  Rhythm: atrial flutter  Lab Results:  Basename 05/10/11 0640 05/08/11 1700  WBC 6.8 3.9*  HGB 12.0* 12.7*  PLT 149* 161    Basename 05/09/11 1020 05/08/11 1700  NA 138 --  K 3.7 --  CL 104 --  CO2 25 --  GLUCOSE 137* --  BUN 14 17  CREATININE 0.74 0.92   No results found for this basename: TROPONINI:2,CK,MB:2 in the last 72 hours Hepatic Function Panel No results found for this basename: PROT,ALBUMIN,AST,ALT,ALKPHOS,BILITOT,BILIDIR,IBILI in the last 72 hours No results found for this basename: CHOL in the last 72 hours No results found for this basename: INR in the last 72 hours  Imaging: Ct Chest W Contrast  05/09/2011  *RADIOLOGY REPORT*  Clinical Data:  Progressive cervical cord lesion.  Evaluate for malignancy or sarcoidosis.  History of remote aortic surgery.  CT CHEST, ABDOMEN AND PELVIS WITH CONTRAST  Technique:  Multidetector CT imaging of the chest, abdomen and pelvis was performed following the standard protocol during bolus administration of intravenous contrast.  Contrast: OMNIPAQUE IOHEXOL 300 MG/ML IV SOLN  Comparison:  Cervical MRI 04/26/2011 and 05/08/2011.  Pelvic CT 01/17/2010.  CT CHEST  Findings:  There is central enlargement of the pulmonary arteries consistent with pulmonary arterial hypertension.   The vessels are well opacified without evidence of pulmonary embolism.  The thoracic aorta appears normal.  There are no enlarged mediastinal or hilar lymph nodes.  There is no pleural or pericardial effusion.  Mild chronic lung disease is present with mild emphysema.  Linear scarring is present within the right middle lobe and lingula. Focal ground-glass opacities are demonstrated in both upper lobes, seen in the right upper lobe on images 22 and 31 and in the left upper lobe on images 21 and 22.  These do not have any significant solid components.  There is no endobronchial lesion or central thickening of the bronchovascular bundles.  Moderate convex right thoracolumbar scoliosis is noted. There is congenital or post-traumatic deformity of the right fourth and fifth ribs.  IMPRESSION:  1.  No specific signs of sarcoidosis are demonstrated within the chest. 2.  Pulmonary arterial hypertension. 3.  Mild chronic lung disease with scattered focal ground-glass opacities in both upper lobes.  These may be post inflammatory in etiology.  Atypical neoplasm (low grade adenocarcinoma) can have this appearance. CT followup in 3-6 months recommended.  This recommendation is taken from the following article:  Subsolid Pulmonary Nodules and the Spectrum of Peripheral Adenocarcinomas of the Lung:  Recommended Interim Guidelines for Assessment and Management.  Radiology December 2009; [id number].  CT ABDOMEN AND PELVIS  Findings:  The liver, gallbladder, biliary system and pancreas appear normal.  The spleen appears normal.  There is no adrenal mass.  There are numerous renal parapelvic cysts bilaterally. There is no hydronephrosis or  delay in contrast excretion.  There is no evidence of renal mass.  There is aorto iliac atherosclerosis without evidence of aneurysm or large vessel occlusion.  No enlarged abdominal pelvic lymph nodes are identified.  Urinary bladder is mildly distended.  Prostate gland is moderately enlarged, not  significantly changed from the prior study. Moderately advanced facet disease is present within the lower lumbar spine with resulting foraminal stenosis on the right at L4- L5.  There are no acute osseous findings. Bone island in the left iliac bone is stable.  IMPRESSION:  1.  No evidence of intra-abdominal malignancy. 2.  Stable enlargement of the prostate gland. 3.  Bilateral renal parapelvic cysts. 4.  Lower lumbar spine facet disease and thoracolumbar scoliosis.  Original Report Authenticated By: Gerrianne Scale, M.D.   Mr Cervical Spine W Wo Contrast  05/09/2011  *RADIOLOGY REPORT*  Clinical Data: Cervical myelopathy, progressive over the last several days.  MRI CERVICAL SPINE WITHOUT AND WITH CONTRAST  Technique:  Multiplanar and multiecho pulse sequences of the cervical spine, to include the craniocervical junction and cervicothoracic junction, were obtained according to standard protocol without and with intravenous contrast.  Contrast: 12mL MULTIHANCE GADOBENATE DIMEGLUMINE 529 MG/ML IV SOLN  Comparison: Cervical MRI 04/26/2011  Findings: There is abnormal signal within the spinal cord.  There is patchy hyperintensity within the cord bilaterally beginning at the C2-3 level extending to C5.  There appears to be slight progression of signal abnormality the left side of the cord compared the prior study.  The largest area of hyperintensity in the cord on the right at the C3 level on the prior study actually shows slight improvement.  The cord is not enlarged.  No cord hemorrhage is identified.  Axial images are degraded by motion making it difficult to determine the exact location of the signal abnormality however this appears to be in the central cord bilaterally.  Postcontrast images reveal patchy enhancement within the cord bilaterally from C2-3 through C5.  This again appears to be in the central cord bilaterally.  I do not detect any enlarged intramedullary or dural based vessels suggesting  vascular malformation.  This does not appear to represent a mass or tumor given lack of enlargement of the cord and the patchy signal abnormality.  Mild cervical kyphosis is present.  Disc degeneration and spondylosis are present throughout the cervical spine.  This causes mild spinal stenosis at C2-3, C3-4, C4-5 on the left, C5-6, and C6- 7.  This is unchanged from the  prior study.  While there is spinal stenosis and mild cord deformity, the signal abnormality in the cord would appear to be due to a separate and acute process rather than chronic myelomalacia from spinal stenosis.  IMPRESSION: Patchy hyperintensity within the spinal cord from C2-3 through C5 bilaterally.  The cord is not enlarged.  There is patchy enhancement within the cord bilaterally.  Differential diagnosis includes subacute cord infarction. Transverse myelitis is another possibility.  Vascular malformation and cord ischemia is a possibility however no enlarged cord or neural vessels are present suggesting a vascular malformation. Neoplasm not considered likely.  Critical Value/emergent results were called by telephone at the time of interpretation on 05/08/2011  at 1945 hours  to  Dr. Sandria Manly, who verbally acknowledged these results.  Original Report Authenticated By: Camelia Phenes, M.D.   Ct Abdomen Pelvis W Contrast  05/09/2011  *RADIOLOGY REPORT*  Clinical Data:  Progressive cervical cord lesion.  Evaluate for malignancy or sarcoidosis.  History of remote  aortic surgery.  CT CHEST, ABDOMEN AND PELVIS WITH CONTRAST  Technique:  Multidetector CT imaging of the chest, abdomen and pelvis was performed following the standard protocol during bolus administration of intravenous contrast.  Contrast: OMNIPAQUE IOHEXOL 300 MG/ML IV SOLN  Comparison:  Cervical MRI 04/26/2011 and 05/08/2011.  Pelvic CT 01/17/2010.  CT CHEST  Findings:  There is central enlargement of the pulmonary arteries consistent with pulmonary arterial hypertension.  The  vessels are well opacified without evidence of pulmonary embolism.  The thoracic aorta appears normal.  There are no enlarged mediastinal or hilar lymph nodes.  There is no pleural or pericardial effusion.  Mild chronic lung disease is present with mild emphysema.  Linear scarring is present within the right middle lobe and lingula. Focal ground-glass opacities are demonstrated in both upper lobes, seen in the right upper lobe on images 22 and 31 and in the left upper lobe on images 21 and 22.  These do not have any significant solid components.  There is no endobronchial lesion or central thickening of the bronchovascular bundles.  Moderate convex right thoracolumbar scoliosis is noted. There is congenital or post-traumatic deformity of the right fourth and fifth ribs.  IMPRESSION:  1.  No specific signs of sarcoidosis are demonstrated within the chest. 2.  Pulmonary arterial hypertension. 3.  Mild chronic lung disease with scattered focal ground-glass opacities in both upper lobes.  These may be post inflammatory in etiology.  Atypical neoplasm (low grade adenocarcinoma) can have this appearance. CT followup in 3-6 months recommended.  This recommendation is taken from the following article:  Subsolid Pulmonary Nodules and the Spectrum of Peripheral Adenocarcinomas of the Lung:  Recommended Interim Guidelines for Assessment and Management.  Radiology December 2009; [id number].  CT ABDOMEN AND PELVIS  Findings:  The liver, gallbladder, biliary system and pancreas appear normal.  The spleen appears normal.  There is no adrenal mass.  There are numerous renal parapelvic cysts bilaterally. There is no hydronephrosis or delay in contrast excretion.  There is no evidence of renal mass.  There is aorto iliac atherosclerosis without evidence of aneurysm or large vessel occlusion.  No enlarged abdominal pelvic lymph nodes are identified.  Urinary bladder is mildly distended.  Prostate gland is moderately enlarged, not  significantly changed from the prior study. Moderately advanced facet disease is present within the lower lumbar spine with resulting foraminal stenosis on the right at L4- L5.  There are no acute osseous findings. Bone island in the left iliac bone is stable.  IMPRESSION:  1.  No evidence of intra-abdominal malignancy. 2.  Stable enlargement of the prostate gland. 3.  Bilateral renal parapelvic cysts. 4.  Lower lumbar spine facet disease and thoracolumbar scoliosis.  Original Report Authenticated By: Gerrianne Scale, M.D.   Dg Cerv Spine Flex&ext Only  05/08/2011  *RADIOLOGY REPORT*  Clinical Data: Cervical myelopathy.  Numbness in the neck and arms.  CERVICAL SPINE - FLEXION AND EXTENSION VIEWS ONLY  Comparison: 04/26/2011 MRI  Findings: There is loss of cervical lordosis.  Significant degenerative changes are identified throughout the cervical spine but most notably at C4-5, C5-6, C6-7.  There is limited range of motion with flexion and extension.  No evidence for translocation.  IMPRESSION:  1.  No evidence for instability with flexion and extension. 2.  Limited range of motion on flexion and extension.  Original Report Authenticated By: Patterson Hammersmith, M.D.    Cardiac Studies:  Assessment/Plan:   1. Atrial flutter with slow  VR response.  2. History of asymptomatic Sinus bradycardia  3. BPH  4. ASD repair 1972, nl 2D as an outpatient  5. Dyslipidemia, treated  6. DJD, needs Rt TKR  7. Seizure disorder  8. Question C-spine myelitis  9. Hx GERD  10. Negative myoview 09/11   Plan- Pt now on heparin. See Dr Hart Carwin note, Rx current myelitis vs arthritis and consider RFA/conversion at a later date. Start oral anticoagulant when OK with Dr Marjory Lies.    Corine Shelter PA-C 05/10/2011, 8:27 AM  Patient seen and examined. Agree with assessment and plan. No chest pain or sob. A Flutter rate controlled; bradycardia improved off digoxin. Defer flutter ablation to a later date Lenah Messenger  A 05/10/2011 1:42 PM

## 2011-05-11 ENCOUNTER — Inpatient Hospital Stay (HOSPITAL_COMMUNITY): Payer: 59

## 2011-05-11 DIAGNOSIS — R269 Unspecified abnormalities of gait and mobility: Secondary | ICD-10-CM

## 2011-05-11 DIAGNOSIS — G8250 Quadriplegia, unspecified: Secondary | ICD-10-CM

## 2011-05-11 LAB — HEPARIN LEVEL (UNFRACTIONATED)
Heparin Unfractionated: 0.45 IU/mL (ref 0.30–0.70)
Heparin Unfractionated: 0.55 IU/mL (ref 0.30–0.70)

## 2011-05-11 LAB — PROTIME-INR
INR: 1.34 (ref 0.00–1.49)
Prothrombin Time: 16.8 seconds — ABNORMAL HIGH (ref 11.6–15.2)

## 2011-05-11 LAB — CBC
Hemoglobin: 11.5 g/dL — ABNORMAL LOW (ref 13.0–17.0)
MCH: 30.1 pg (ref 26.0–34.0)
Platelets: 141 10*3/uL — ABNORMAL LOW (ref 150–400)
RBC: 3.82 MIL/uL — ABNORMAL LOW (ref 4.22–5.81)
WBC: 5.8 10*3/uL (ref 4.0–10.5)

## 2011-05-11 NOTE — Consult Note (Signed)
ANTICOAGULATION CONSULT NOTE - Follow Up Consult  Pharmacy Consult for Heparin Indication: atrial flutter and possible clots in carotid arteries  No Known Allergies  Patient Measurements: Height: 5\' 9"  (175.3 cm) Weight: 143 lb 4.8 oz (65 kg) IBW/kg (Calculated) : 70.7   Vital Signs: Temp: 98.3 F (36.8 C) (11/29 0400) BP: 149/110 mmHg (11/29 0900) Pulse Rate: 99  (11/29 0900)  Labs:  Basename 05/11/11 0505 05/11/11 05/10/11 1600 05/10/11 0640 05/09/11 1020 05/08/11 1700  HGB 11.5* -- -- 12.0* -- --  HCT 34.8* -- -- 35.9* -- 38.0*  PLT 141* -- -- 149* -- 161  APTT -- -- -- -- -- --  LABPROT -- -- -- -- -- --  INR -- -- -- -- -- --  HEPARINUNFRC 0.45 0.55 0.62 -- -- --  CREATININE -- -- -- -- 0.74 0.92  CKTOTAL -- -- -- -- -- --  CKMB -- -- -- -- -- --  TROPONINI -- -- -- -- -- --   Estimated Creatinine Clearance: 71.1 ml/min (by C-G formula based on Cr of 0.74).  Medical History: Past Medical History  Diagnosis Date  . GERD (gastroesophageal reflux disease)   . Dysphagia     with solids  . Seizures   . Hypertension   . Hiatal hernia   . Esophageal stricture   . Atrial fibrillation   . Diverticulosis   . Osteopenia   . Hemorrhoids    Medications:  Scheduled:     . aspirin  324 mg Oral QPM  . cholecalciferol  2,000 Units Oral Daily  . doxazosin  4 mg Oral QPM  . finasteride  5 mg Oral QPM  . methylPREDNISolone (SOLU-MEDROL) injection  1,000 mg Intravenous QHS  . multivitamins ther. w/minerals  1 tablet Oral Daily  . pantoprazole  40 mg Oral QPM  . phenytoin  300 mg Oral QHS  . rosuvastatin  20 mg Oral Daily   Assessment: 77 yo male with atrial flutter and possible clots in bilateral carotid arteries. Heparin level is at-goal.   Goal of Therapy:  Heparin level 0.3-0.7 units/ml   Plan:  1. Continue heparin IV infusion at 1200 units/hr.  2. F/u HL in AM.   Lorre Munroe, PharmD  05/11/2011 9:32 AM

## 2011-05-11 NOTE — Consult Note (Signed)
ANTICOAGULATION CONSULT NOTE - Follow Up Consult  Pharmacy Consult for Heparin Indication: atrial fibrillation/flutter and possible clots in carotid arteries  No Known Allergies  Patient Measurements: Height: 5\' 9"  (175.3 cm) Weight: 138 lb 7.2 oz (62.8 kg) IBW/kg (Calculated) : 70.7   Vital Signs: Temp: 98.5 F (36.9 C) (11/29 0000) Temp src: Oral (11/28 1601) BP: 121/60 mmHg (11/29 0200) Pulse Rate: 41  (11/29 0200)  Labs:  Basename 05/11/11 05/10/11 1600 05/10/11 0640 05/09/11 1020 05/08/11 1700  HGB -- -- 12.0* -- 12.7*  HCT -- -- 35.9* -- 38.0*  PLT -- -- 149* -- 161  APTT -- -- -- -- --  LABPROT -- -- -- -- --  INR -- -- -- -- --  HEPARINUNFRC 0.55 0.62 0.11* -- --  CREATININE -- -- -- 0.74 0.92  CKTOTAL -- -- -- -- --  CKMB -- -- -- -- --  TROPONINI -- -- -- -- --   Estimated Creatinine Clearance: 68.7 ml/min (by C-G formula based on Cr of 0.74).  Medical History: Past Medical History  Diagnosis Date  . GERD (gastroesophageal reflux disease)   . Dysphagia     with solids  . Seizures   . Hypertension   . Hiatal hernia   . Esophageal stricture   . Atrial fibrillation   . Diverticulosis   . Osteopenia   . Hemorrhoids    Medications:  Scheduled:    . aspirin  324 mg Oral QPM  . cholecalciferol  2,000 Units Oral Daily  . doxazosin  4 mg Oral QPM  . finasteride  5 mg Oral QPM  . methylPREDNISolone (SOLU-MEDROL) injection  1,000 mg Intravenous QHS  . multivitamins ther. w/minerals  1 tablet Oral Daily  . pantoprazole  40 mg Oral QPM  . phenytoin  300 mg Oral QHS  . rosuvastatin  20 mg Oral Daily   Assessment: 77 yo male with atrial flutter and possible clots in bilateral carotid arteries remains therapeutic on heparin.   Goal of Therapy:  Heparin level 0.3-0.7 units/ml   Plan:  Will continue gtt at current rate and continue to follow.   Vernard Gambles, PharmD, BCPS  05/11/2011 2:37 AM

## 2011-05-11 NOTE — Progress Notes (Signed)
Subjective: Patient reports that he remains pain-free.  Feels somewhat stronger in his lower extremities but upper extremities have not improved.  Now s/p day 3 of 5 treatments. ESR-4; TSH-4.185; VDRL-NR; CSF glucose-66/protein-69; CSF cx-neg; CSF cytology-neg; Cryptococcal ag-neg Echo shows no evidence of intracardiac thrombi or masses;no PFO seen   Objective: Vital signs in last 24 hours: Temp:  [97.6 F (36.4 C)-98.5 F (36.9 C)] 98.3 F (36.8 C) (11/29 0400) Pulse Rate:  [40-99] 99  (11/29 0900) Resp:  [15-20] 18  (11/29 0900) BP: (93-149)/(42-110) 149/110 mmHg (11/29 0900) SpO2:  [95 %-98 %] 98 % (11/29 0900) Weight:  [65 kg (143 lb 4.8 oz)] 143 lb 4.8 oz (65 kg) (11/29 0500)  Intake/Output from previous day: 11/28 0701 - 11/29 0700 In: 2357.5 [P.O.:360; I.V.:1997.5] Out: 1325 [Urine:1325] Intake/Output this shift: Total I/O In: 174 [I.V.:174] Out: 175 [Urine:175] Nutritional status: Cardiac  Neurologic Exam: Alert, oriented, thought content appropriate. Speech fluent without evidence of aphasia. Able to follow 3 step commands without difficulty.  Cranial Nerves:  II: visual fields grossly normal, pupils equal, round, reactive to light and accommodation  III,IV, VI: ptosis not present, extraocular muscles extra-ocular motions intact bilaterally  V,VII: smile symmetric, facial light touch sensation normal bilaterally  VIII: hearing normal bilaterally  IX,X: gag reflex present  XI: trapezius strength decreased bilaterally  XII: tongue strength normal  Motor:  Right Upper extremity - 5/5 hand grip with 3+/5 strength proximally. Patient able to lift arm to about 135 degrees without assistance.  Left Upper extremity - 5/5 hand grip with 3/5 strength proximally. Patient able to lift arm to about 75 degrees without assistance  Right Lower extremity - 5/5 plantar flexion and extension, 4-/5 proximally with patient able to lift leg easily 45 degrees  Left Lower extremity - 5/5  plantar flexion and extension, 4+/5 proximally with patient able to lift leg easily to 45 degrees  Tone and bulk:normal tone throughout; no atrophy noted  Sensory: Pinprick and light touch intact throughout in the lower extremities, decreased in the fingertips bilaterally  Deep Tendon Reflexes: 1+ at the biceps and absent otherwise  Plantars:  Right: mute Left: mute  Cerebellar:  Not performed  Lab Results:  Basename 05/11/11 0505 05/10/11 0640 05/09/11 1020 05/08/11 1700  WBC 5.8 6.8 -- --  HGB 11.5* 12.0* -- --  HCT 34.8* 35.9* -- --  PLT 141* 149* -- --  NA -- -- 138 --  K -- -- 3.7 --  CL -- -- 104 --  CO2 -- -- 25 --  GLUCOSE -- -- 137* --  BUN -- -- 14 17  CREATININE -- -- 0.74 0.92  CALCIUM -- -- 8.7 --  LABA1C -- -- -- --    Studies/Results: Ct Angio Neck W/cm &/or Wo/cm  05/10/2011  *RADIOLOGY REPORT*  Clinical Data:  Possible infarct.  High blood pressure.  Seizures.  CT ANGIOGRAPHY NECK  Technique:  Multidetector CT imaging of the neck was performed using the standard protocol during bolus administration of intravenous contrast.  Multiplanar CT image reconstructions including MIPs were obtained to evaluate the vascular anatomy. Carotid stenosis measurements (when applicable) are obtained utilizing NASCET criteria, using the distal internal carotid diameter as the denominator.  Contrast: 50mL OMNIPAQUE IOHEXOL 350 MG/ML IV SOLN  Comparison:  04/26/2011 MR brain.05/08/2011 04/26/2011 cervical spine MR.  Findings:  Abnormal appearance of the carotid bifurcation bilaterally.  Low-level peripheral low density material is noted within the carotid bifurcation bilaterally most prominent in the right carotid bulb.  Appearance is atypical raising possibility of artifact, however in retrospect, abnormality at this level is noted on the axial images of recent cervical spine MRs and therefore this is felt to represent a true finding raising the possibility of thrombus and / or  atherosclerotic plaque. Less than 50% diameter stenosis of the proximal internal carotid artery bilaterally.  Abnormal appearance of the distal vertical cervical segment of the right internal carotid artery just proximal to the skull base with a pleated appearance raising possibility of fibromuscular dysplasia.  No high-grade stenosis of either vertebral artery.  At the C2 level, there is a slightly pleated appearance of the left vertebral artery raising possibility of fibromuscular dysplasia.  Cervical spondylotic changes.  Please see recent MR report.  Biapical pleural thickening without associated bony destruction. Bilateral upper lobe parenchymal changes.  Follow up as noted on recent chest CT.  Mild asymmetry palatine tonsils and piriform sinus with slight fullness/underfilling on the left respectively.  This may be normal although mucosal abnormality cannot be excluded.  Scattered normal sized lymph nodes.   Review of the MIP images confirms the above findings.  IMPRESSION: Thrombus and / or atherosclerotic plaque carotid bifurcation bilaterally with less than 50% diameter stenosis of the proximal internal carotid artery bilaterally.  Findings raise possibility fibromuscular dysplasia involving the distal vertical segment of the right internal carotid artery and the left vertebral artery at the C2 level.  Cervical spondylotic changes.  Please see recent MR report.  Biapical pleural thickening without associated bony destruction. Bilateral upper lobe parenchymal changes.  Follow up as noted on recent chest CT.  Mild asymmetry palatine tonsils and piriform sinus with slight fullness/underfilling on the left respectively.  This may be normal although mucosal abnormality cannot be excluded.  Preliminary report at the time imaging by Dr. Pecolia Ades was called to Dr. Imagene Gurney office.  Original Report Authenticated By: Fuller Canada, M.D.   Ct Chest W Contrast  05/09/2011  *RADIOLOGY REPORT*  Clinical Data:   Progressive cervical cord lesion.  Evaluate for malignancy or sarcoidosis.  History of remote aortic surgery.  CT CHEST, ABDOMEN AND PELVIS WITH CONTRAST  Technique:  Multidetector CT imaging of the chest, abdomen and pelvis was performed following the standard protocol during bolus administration of intravenous contrast.  Contrast: OMNIPAQUE IOHEXOL 300 MG/ML IV SOLN  Comparison:  Cervical MRI 04/26/2011 and 05/08/2011.  Pelvic CT 01/17/2010.  CT CHEST  Findings:  There is central enlargement of the pulmonary arteries consistent with pulmonary arterial hypertension.  The vessels are well opacified without evidence of pulmonary embolism.  The thoracic aorta appears normal.  There are no enlarged mediastinal or hilar lymph nodes.  There is no pleural or pericardial effusion.  Mild chronic lung disease is present with mild emphysema.  Linear scarring is present within the right middle lobe and lingula. Focal ground-glass opacities are demonstrated in both upper lobes, seen in the right upper lobe on images 22 and 31 and in the left upper lobe on images 21 and 22.  These do not have any significant solid components.  There is no endobronchial lesion or central thickening of the bronchovascular bundles.  Moderate convex right thoracolumbar scoliosis is noted. There is congenital or post-traumatic deformity of the right fourth and fifth ribs.  IMPRESSION:  1.  No specific signs of sarcoidosis are demonstrated within the chest. 2.  Pulmonary arterial hypertension. 3.  Mild chronic lung disease with scattered focal ground-glass opacities in both upper lobes.  These may be post  inflammatory in etiology.  Atypical neoplasm (low grade adenocarcinoma) can have this appearance. CT followup in 3-6 months recommended.  This recommendation is taken from the following article:  Subsolid Pulmonary Nodules and the Spectrum of Peripheral Adenocarcinomas of the Lung:  Recommended Interim Guidelines for Assessment and Management.   Radiology December 2009; [id number].  CT ABDOMEN AND PELVIS  Findings:  The liver, gallbladder, biliary system and pancreas appear normal.  The spleen appears normal.  There is no adrenal mass.  There are numerous renal parapelvic cysts bilaterally. There is no hydronephrosis or delay in contrast excretion.  There is no evidence of renal mass.  There is aorto iliac atherosclerosis without evidence of aneurysm or large vessel occlusion.  No enlarged abdominal pelvic lymph nodes are identified.  Urinary bladder is mildly distended.  Prostate gland is moderately enlarged, not significantly changed from the prior study. Moderately advanced facet disease is present within the lower lumbar spine with resulting foraminal stenosis on the right at L4- L5.  There are no acute osseous findings. Bone island in the left iliac bone is stable.  IMPRESSION:  1.  No evidence of intra-abdominal malignancy. 2.  Stable enlargement of the prostate gland. 3.  Bilateral renal parapelvic cysts. 4.  Lower lumbar spine facet disease and thoracolumbar scoliosis.  Original Report Authenticated By: Gerrianne Scale, M.D.   Ct Abdomen Pelvis W Contrast  05/09/2011  *RADIOLOGY REPORT*  Clinical Data:  Progressive cervical cord lesion.  Evaluate for malignancy or sarcoidosis.  History of remote aortic surgery.  CT CHEST, ABDOMEN AND PELVIS WITH CONTRAST  Technique:  Multidetector CT imaging of the chest, abdomen and pelvis was performed following the standard protocol during bolus administration of intravenous contrast.  Contrast: OMNIPAQUE IOHEXOL 300 MG/ML IV SOLN  Comparison:  Cervical MRI 04/26/2011 and 05/08/2011.  Pelvic CT 01/17/2010.  CT CHEST  Findings:  There is central enlargement of the pulmonary arteries consistent with pulmonary arterial hypertension.  The vessels are well opacified without evidence of pulmonary embolism.  The thoracic aorta appears normal.  There are no enlarged mediastinal or hilar lymph nodes.  There  is no pleural or pericardial effusion.  Mild chronic lung disease is present with mild emphysema.  Linear scarring is present within the right middle lobe and lingula. Focal ground-glass opacities are demonstrated in both upper lobes, seen in the right upper lobe on images 22 and 31 and in the left upper lobe on images 21 and 22.  These do not have any significant solid components.  There is no endobronchial lesion or central thickening of the bronchovascular bundles.  Moderate convex right thoracolumbar scoliosis is noted. There is congenital or post-traumatic deformity of the right fourth and fifth ribs.  IMPRESSION:  1.  No specific signs of sarcoidosis are demonstrated within the chest. 2.  Pulmonary arterial hypertension. 3.  Mild chronic lung disease with scattered focal ground-glass opacities in both upper lobes.  These may be post inflammatory in etiology.  Atypical neoplasm (low grade adenocarcinoma) can have this appearance. CT followup in 3-6 months recommended.  This recommendation is taken from the following article:  Subsolid Pulmonary Nodules and the Spectrum of Peripheral Adenocarcinomas of the Lung:  Recommended Interim Guidelines for Assessment and Management.  Radiology December 2009; [id number].  CT ABDOMEN AND PELVIS  Findings:  The liver, gallbladder, biliary system and pancreas appear normal.  The spleen appears normal.  There is no adrenal mass.  There are numerous renal parapelvic cysts bilaterally. There is no hydronephrosis  or delay in contrast excretion.  There is no evidence of renal mass.  There is aorto iliac atherosclerosis without evidence of aneurysm or large vessel occlusion.  No enlarged abdominal pelvic lymph nodes are identified.  Urinary bladder is mildly distended.  Prostate gland is moderately enlarged, not significantly changed from the prior study. Moderately advanced facet disease is present within the lower lumbar spine with resulting foraminal stenosis on the right at  L4- L5.  There are no acute osseous findings. Bone island in the left iliac bone is stable.  IMPRESSION:  1.  No evidence of intra-abdominal malignancy. 2.  Stable enlargement of the prostate gland. 3.  Bilateral renal parapelvic cysts. 4.  Lower lumbar spine facet disease and thoracolumbar scoliosis.  Original Report Authenticated By: Gerrianne Scale, M.D.    Medications:  I have reviewed the patient's current medications. Scheduled:   . aspirin  324 mg Oral QPM  . cholecalciferol  2,000 Units Oral Daily  . doxazosin  4 mg Oral QPM  . finasteride  5 mg Oral QPM  . methylPREDNISolone (SOLU-MEDROL) injection  1,000 mg Intravenous QHS  . multivitamins ther. w/minerals  1 tablet Oral Daily  . pantoprazole  40 mg Oral QPM  . phenytoin  300 mg Oral QHS  . rosuvastatin  20 mg Oral Daily    Assessment/Plan:  Patient Active Hospital Problem List: Cervical myelopathy (05/08/2011)-likely transverse myelitis  Assessment: Patient no longer with pain but continues to have significant proximal weakness in all extremities  Plan: 1. Continue on steroids for 2 more treatments            2. Urinating frequently. Bladder scan shows retained volume of 390 with overflow dribbling noted.  Concerned about propensity for infection.  Will place foley for now. today.            3. Continue to follow up pending results of LP Atrial flutter, bradycardia (04/18/2011)  Assessment: Cardiology following patient   Plan: 1. Continue heparin as per cardiology  Carotid thrombus-bilateral   Assessment-seen on CTA, does not seem to be symptomatic   Plan: 1. Patient on heparin           2. Pharmacy is following heparin    LOS: 3 days   Thana Farr, MD [facility] [phone] 05/11/2011  9:18 AM

## 2011-05-11 NOTE — Progress Notes (Signed)
SUBJECTIVE: The patient is doing well today.  He feels that his strength is improving with steroids.  At this time, he denies chest pain, shortness of breath, dizziness, presyncope or any new concerns.     Marland Kitchen aspirin  324 mg Oral QPM  . cholecalciferol  2,000 Units Oral Daily  . doxazosin  4 mg Oral QPM  . finasteride  5 mg Oral QPM  . methylPREDNISolone (SOLU-MEDROL) injection  1,000 mg Intravenous QHS  . multivitamins ther. w/minerals  1 tablet Oral Daily  . pantoprazole  40 mg Oral QPM  . phenytoin  300 mg Oral QHS  . rosuvastatin  20 mg Oral Daily      . sodium chloride 75 mL/hr at 05/10/11 2116  . heparin 1,200 Units/hr (05/10/11 1900)    OBJECTIVE: Physical Exam: Filed Vitals:   05/11/11 0400 05/11/11 0500 05/11/11 0600 05/11/11 0700  BP: 119/59 129/57 117/61 142/72  Pulse: 47 44 55 51  Temp: 98.3 F (36.8 C)     TempSrc:      Resp:      Height:      Weight:  143 lb 4.8 oz (65 kg)    SpO2: 95% 95% 97% 96%    Intake/Output Summary (Last 24 hours) at 05/11/11 0823 Last data filed at 05/11/11 0500  Gross per 24 hour  Intake 2185.96 ml  Output   1175 ml  Net 1010.96 ml    Telemetry reveals atrial fluter, V rates 40s-50s  GEN- The patient is well appearing, alert and oriented x 3 today.   Head- normocephalic, atraumatic Eyes-  Sclera clear, conjunctiva pink Ears- hearing intact Oropharynx- clear Neck- supple, no JVP Lymph- no cervical lymphadenopathy Lungs- Clear to ausculation bilaterally, normal work of breathing Heart- irregular rate and rhythm, no murmurs, rubs or gallops, PMI not laterally displaced GI- soft, NT, ND, + BS Extremities- no clubbing, cyanosis, or edema   LABS: Basic Metabolic Panel:  Basename 05/09/11 1020 05/08/11 1700  NA 138 --  K 3.7 --  CL 104 --  CO2 25 --  GLUCOSE 137* --  BUN 14 17  CREATININE 0.74 0.92  CALCIUM 8.7 --  MG 1.5 --  PHOS -- --   Liver Function Tests: No results found for this basename:  AST:2,ALT:2,ALKPHOS:2,BILITOT:2,PROT:2,ALBUMIN:2 in the last 72 hours No results found for this basename: LIPASE:2,AMYLASE:2 in the last 72 hours CBC:  Basename 05/11/11 0505 05/10/11 0640  WBC 5.8 6.8  NEUTROABS -- --  HGB 11.5* 12.0*  HCT 34.8* 35.9*  MCV 91.1 90.2  PLT 141* 149*   RADIOLOGY: Ct Angio Neck W/cm &/or Wo/cm  05/10/2011  *RADIOLOGY REPORT*  Clinical Data:  Possible infarct.  High blood pressure.  Seizures.  CT ANGIOGRAPHY NECK  Technique:  Multidetector CT imaging of the neck was performed using the standard protocol during bolus administration of intravenous contrast.  Multiplanar CT image reconstructions including MIPs were obtained to evaluate the vascular anatomy. Carotid stenosis measurements (when applicable) are obtained utilizing NASCET criteria, using the distal internal carotid diameter as the denominator.  Contrast: 50mL OMNIPAQUE IOHEXOL 350 MG/ML IV SOLN  Comparison:  04/26/2011 [facility] brain.05/08/2011 04/26/2011 cervical spine [facility].  Findings:  Abnormal appearance of the carotid bifurcation bilaterally.  Low-level peripheral low density material is noted within the carotid bifurcation bilaterally most prominent in the right carotid bulb.  Appearance is atypical raising possibility of artifact, however in retrospect, abnormality at this level is noted on the axial images of recent cervical spine MRs and therefore this  is felt to represent a true finding raising the possibility of thrombus and / or atherosclerotic plaque. Less than 50% diameter stenosis of the proximal internal carotid artery bilaterally.  Abnormal appearance of the distal vertical cervical segment of the right internal carotid artery just proximal to the skull base with a pleated appearance raising possibility of fibromuscular dysplasia.  No high-grade stenosis of either vertebral artery.  At the C2 level, there is a slightly pleated appearance of the left vertebral artery raising possibility of fibromuscular  dysplasia.  Cervical spondylotic changes.  Please see recent Jay report.  Biapical pleural thickening without associated bony destruction. Bilateral upper lobe parenchymal changes.  Follow up as noted on recent chest CT.  Mild asymmetry palatine tonsils and piriform sinus with slight fullness/underfilling on the left respectively.  This may be normal although mucosal abnormality cannot be excluded.  Scattered normal sized lymph nodes.   Review of the MIP images confirms the above findings.  IMPRESSION: Thrombus and / or atherosclerotic plaque carotid bifurcation bilaterally with less than 50% diameter stenosis of the proximal internal carotid artery bilaterally.  Findings raise possibility fibromuscular dysplasia involving the distal vertical segment of the right internal carotid artery and the left vertebral artery at the C2 level.  Cervical spondylotic changes.  Please see recent Jay report.  Biapical pleural thickening without associated bony destruction. Bilateral upper lobe parenchymal changes.  Follow up as noted on recent chest CT.  Mild asymmetry palatine tonsils and piriform sinus with slight fullness/underfilling on the left respectively.  This may be normal although mucosal abnormality cannot be excluded.  Preliminary report at the time imaging by Dr. Pecolia Ades was called to Dr. Imagene Gurney office.  Original Report Authenticated By: Jay Walters, M.D.   ASSESSMENT AND PLAN:  Principal Problem:  *Cervical myelopathy Active Problems:  Atrial flutter  S/P atrial septal defect closure, surgical  Bradycardia  Jay Walters continues to have asymptomatic typical appearing atrial flutter with an asymptomatic bradycardic rate.  At this time, I think that our primary focus should continue to be on the etiology and management of his neurologic process. Once cleared by neurology, I would favor initiation of coumadin for stroke prevention at that time.  He is presently on heparin. As he is persently asymptomatic  from a CV standpoint, I would defer the consideration of cardioversion/ ablation until his neurologic issues have been completely addressed.  (possibly deferred to outpatient follow-up).  He does not have an indication for pacemaker implantation.  Will see again on Monday if still here. Call with questions.   Jay Range, MD 05/11/2011 8:23 AM

## 2011-05-11 NOTE — Significant Event (Signed)
At 10:30 I found pt laying on his back on the floor on the far side of his bed while walking into his room to give him his 10:00 medications. Verneda Skill, RN and Shawnie Dapper, RN were with me at the time. We all entered the room and assessed the patient and assisted him back to bed. He stated that his head had "bounced off of something" during the fall. His neuro status was unchanged. Dr. Thad Ranger was notified and ordered a stat head CT. After returning from CT I reoriented pt to the call bell and safety concerns. We contracted again for safety. Door is open and in view of nursing station. Will continue to monitor neuro status and pain frequently.

## 2011-05-11 NOTE — Progress Notes (Signed)
Subjective:  No chest pain or SOB, strength in his arms improving.  Objective:  Vital Signs in the last 24 hours: Temp:  [97.6 F (36.4 C)-98.5 F (36.9 C)] 98.3 F (36.8 C) (11/29 0400) Pulse Rate:  [40-69] 51  (11/29 0700) Resp:  [15-20] 17  (11/29 0000) BP: (93-146)/(42-107) 142/72 mmHg (11/29 0700) SpO2:  [95 %-98 %] 96 % (11/29 0700) Weight:  [65 kg (143 lb 4.8 oz)] 143 lb 4.8 oz (65 kg) (11/29 0500)  Intake/Output from previous day: 11/28 0701 - 11/29 0700 In: 2270.5 [P.O.:360; I.V.:1910.5] Out: 1325 [Urine:1325]  Physical Exam: General appearance: alert, cooperative and no distress Lungs: clear to auscultation bilaterally Heart: irregularly irregular rhythm   Rate: 45-55  Rhythm: atrial flutter, with slow VR  Lab Results:  Basename 05/11/11 0505 05/10/11 0640  WBC 5.8 6.8  HGB 11.5* 12.0*  PLT 141* 149*    Basename 05/09/11 1020 05/08/11 1700  NA 138 --  K 3.7 --  CL 104 --  CO2 25 --  GLUCOSE 137* --  BUN 14 17  CREATININE 0.74 0.92   No results found for this basename: TROPONINI:2,CK,MB:2 in the last 72 hours   Cardiac Studies: 2D EF 60-65  Assessment/Plan:   Principal Problem:  *Cervical myelopathy Active Problems:  Atrial flutter  S/P atrial septal defect closure, surgical  Bradycardia, asymptomatic  ? Carotid thrombus by MRA  Plan-Start Coumadin if OK with Neurology. OP f/u with Dr Tillie Rung in 3wks or so for consideration of RFA of atrial flutter.      Corine Shelter PA-C 05/11/2011, 8:41 AM       Patient seen and examined. Agree with assessment and plan. A flutter with ventricular rate in the 40's. Pt did slip inadvertently earlier today and landed on floor.  Currently alert. Strength improved.    Lennette Bihari, MD, [facility] 05/11/2011 4:31 PM

## 2011-05-12 LAB — CBC
HCT: 34.4 % — ABNORMAL LOW (ref 39.0–52.0)
MCHC: 33.4 g/dL (ref 30.0–36.0)
Platelets: 146 10*3/uL — ABNORMAL LOW (ref 150–400)
RDW: 14.5 % (ref 11.5–15.5)
WBC: 8.5 10*3/uL (ref 4.0–10.5)

## 2011-05-12 LAB — CSF CULTURE W GRAM STAIN
Culture: NO GROWTH
Gram Stain: NONE SEEN

## 2011-05-12 LAB — HEPARIN LEVEL (UNFRACTIONATED): Heparin Unfractionated: 0.34 IU/mL (ref 0.30–0.70)

## 2011-05-12 MED ORDER — WARFARIN VIDEO: 1.0000 | Freq: Once | Status: DC

## 2011-05-12 MED ORDER — WARFARIN SODIUM 5 MG PO TABS
5.0000 mg | ORAL_TABLET | Freq: Once | ORAL | Status: AC
  Administered 2011-05-12: 5 mg via ORAL
  Filled 2011-05-12: qty 1

## 2011-05-12 MED ORDER — COUMADIN BOOK
1.0000 | Freq: Once | Status: AC
  Administered 2011-05-12: 1
  Filled 2011-05-12: qty 1

## 2011-05-12 NOTE — Consult Note (Signed)
Physical Medicine and Rehabilitation Consult Reason for Consult:cervical myelopathy Referring Phsyician: DR Sharyne Peach Negash is an 77 y.o. male HPI: 77 year old male with history of atrial fibrillation as well as atrial septal defect status post repair. Patient also with a remote history of head trauma with subsequent seizure disorder. Admitted November 1 6 with right leg weakness and stumbling gait since October of 2012. Last night bladder incontinence reported. Symptoms have since gradually worsened since November 22. MRI cervical spine showed progression of R sided C2-C5 T2 hyperintensity within the spinal cord . Placed on intravenous Solu-Medrol for suspect transverse myelitis versus possible spinal cord infarct. MRI of the brain was negative for any acute abnormalities. Followup cardiology services for asymptomatic atrial flutter. Placed on heparin and Coumadin therapy. There was plan for ablation in the near future. CT angiogram neck showed thrombus and or atherosclerotic plaque carotid bifurcation bilaterally with less than 50% diameter stenosis of the proximal internal carotid artery bilaterally. Physical therapy evaluation on November 30 patient was total assist to ambulate 50 feet with 2 person hand held assistance. Physical medicine rehabilitation was consulted at the request of physical therapy to consider inpatient rehabilitation services. Review of Systems  Constitutional: Positive for malaise/fatigue.  Eyes: Negative for double vision.  Respiratory: Negative for cough.   Cardiovascular: Negative for chest pain.  Gastrointestinal: Positive for constipation. Negative for nausea.  Genitourinary: Positive for urgency.       Poor sensation of voiding with incont  Musculoskeletal: Positive for joint pain.  Neurological: Positive for seizures and weakness. Negative for dizziness and headaches.  Psychiatric/Behavioral: Negative for depression.   Past Medical History  Diagnosis Date    . GERD (gastroesophageal reflux disease)   . Dysphagia     with solids  . Seizures   . Hypertension   . Hiatal hernia   . Esophageal stricture   . Atrial fibrillation   . Diverticulosis   . Osteopenia   . Hemorrhoids    Past Surgical History  Procedure Date  . Asd repair [id number]  . Knee arthroscopy   . Inguinal hernia repair 2011    Right   Family History  Problem Relation Age of Onset  . Heart disease Father    Social History:  reports that he has never smoked. He has never used smokeless tobacco. He reports that he drinks alcohol. He reports that he does not use illicit drugs. Allergies: No Known Allergies Medications Prior to Admission  Medication Dose Route Frequency Provider Last Rate Last Dose  . 0.9 %  sodium chloride infusion   Intravenous Continuous Joycelyn Schmid, MD 75 mL/hr at 05/12/11 1100    . 0.9 %  sodium chloride infusion  250 mL Intravenous PRN Joycelyn Schmid, MD      . acetaminophen (TYLENOL) tablet 650 mg  650 mg Oral Q6H PRN Joycelyn Schmid, MD   650 mg at 05/09/11 2213  . aspirin chewable tablet 324 mg  324 mg Oral QPM Joycelyn Schmid, MD   324 mg at 05/11/11 2025  . cholecalciferol (VITAMIN D) tablet 2,000 Units  2,000 Units Oral Daily Joycelyn Schmid, MD   2,000 Units at 05/12/11 0932  . doxazosin (CARDURA) tablet 4 mg  4 mg Oral QPM Joycelyn Schmid, MD   4 mg at 05/10/11 1841  . finasteride (PROSCAR) tablet 5 mg  5 mg Oral QPM Joycelyn Schmid, MD   5 mg at 05/11/11 2024  . gadobenate dimeglumine (MULTIHANCE) injection 12 mL  12 mL Intravenous Once PRN Medication Radiologist  12 mL at 05/08/11 1901  . heparin ADULT infusion 100 units/ml (25000 units/250 ml)  1,200 Units/hr Intravenous Continuous Emeline Gins, PHARMD 12 mL/hr at 05/12/11 1100 1,200 Units/hr at 05/12/11 1100  . iohexol (OMNIPAQUE) 300 MG/ML injection 100 mL  100 mL Intravenous Once PRN Medication Radiologist   100 mL at 05/09/11 0959  . iohexol (OMNIPAQUE) 350 MG/ML  injection 50 mL  50 mL Intravenous Once PRN Medication Radiologist   50 mL at 05/09/11 1835  . methylPREDNISolone sodium succinate (SOLU-MEDROL) 1,000 mg in sodium chloride 0.9 % 50 mL IVPB  1,000 mg Intravenous QHS Joycelyn Schmid, MD   1,000 mg at 05/11/11 2231  . multivitamins ther. w/minerals tablet 1 tablet  1 tablet Oral Daily Joycelyn Schmid, MD   1 tablet at 05/12/11 0935  . pantoprazole (PROTONIX) EC tablet 40 mg  40 mg Oral QPM Joycelyn Schmid, MD   40 mg at 05/11/11 2025  . phenytoin (DILANTIN) ER capsule 300 mg  300 mg Oral QHS Joycelyn Schmid, MD   300 mg at 05/11/11 2228  . rosuvastatin (CRESTOR) tablet 20 mg  20 mg Oral Daily Joycelyn Schmid, MD   20 mg at 05/12/11 0932  . senna-docusate (Senokot-S) tablet 1 tablet  1 tablet Oral Daily PRN Joycelyn Schmid, MD   1 tablet at 05/10/11 2116  . traMADol (ULTRAM) tablet 100 mg  100 mg Oral Q8H PRN Joycelyn Schmid, MD   100 mg at 05/09/11 1533  . traZODone (DESYREL) tablet 25 mg  25 mg Oral QHS PRN Joycelyn Schmid, MD   25 mg at 05/09/11 2213  . DISCONTD: aspirin chewable tablet 81 mg  81 mg Oral QPM Joycelyn Schmid, MD      . DISCONTD: digoxin (LANOXIN) tablet 62.5 mcg  62.5 mcg Oral Daily Joycelyn Schmid, MD      . DISCONTD: Ginseng CAPS 250 mg  1 capsule Oral Daily Joycelyn Schmid, MD      . DISCONTD: SILYMARIN CAPS 1 capsule  1 capsule Oral Daily Joycelyn Schmid, MD       Medications Prior to Admission  Medication Sig Dispense Refill  . aspirin 81 MG tablet Take 81 mg by mouth every evening.       Marland Kitchen atorvastatin (LIPITOR) 40 MG tablet Take 40 mg by mouth every evening.       . Cholecalciferol (VITAMIN D3) 1000 UNITS CAPS Take 2,000 Units by mouth daily.       Marland Kitchen doxazosin (CARDURA) 4 MG tablet Take 4 mg by mouth every evening.       . finasteride (PROSCAR) 5 MG tablet Take 5 mg by mouth every evening.       . Ginseng 250 MG CAPS Take 1 capsule by mouth daily.       . Milk Thistle-Turmeric (SILYMARIN) CAPS Take 1 capsule  by mouth daily.        . naproxen sodium (ANAPROX) 220 MG tablet Take 440 mg by mouth 2 (two) times daily as needed. For pain      . pantoprazole (PROTONIX) 40 MG tablet Take 40 mg by mouth every evening.       . phenytoin (DILANTIN) 100 MG ER capsule Take 300 mg by mouth at bedtime.         Home: Home Living Lives With: Spouse;Son Type of Home: House Home Layout: Two level;Able to live on main level with bedroom/bathroom Alternate Level Stairs-Rails: Right;Left;Can reach both Alternate Level Stairs-Number of Steps: 13 Home Access: Stairs to enter Entrance Stairs-Rails:  Can reach both;Left;Right Entrance Stairs-Number of Steps: 11 Bathroom Shower/Tub: Tub only;Walk-in shower;Curtain;Door [employer] Toilet: Standard Home Adaptive Equipment: Crutches  Functional History: Prior Function Level of Independence: Independent with basic ADLs;Independent with homemaking with ambulation;Independent with gait;Independent with transfers Able to Take Stairs?: Reciprically Driving: Yes Vocation: Part time employment 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 Leisure: Hobbies-yes (Comment) Comments: golf Functional Status:  Mobility: Bed Mobility Bed Mobility: Yes Supine to Sit: 5: Supervision;HOB flat;With rails Supine to Sit Details (indicate cue type and reason): Verbal cues for safest sequence. Transfers Transfers: Yes Sit to Stand: 1: +2 Total assist;Patient percentage (comment);From bed (+2 total assist (pt=70%)) Sit to Stand Details (indicate cue type and reason): Assist for balance with translation of trunk over BOS.  Blocked bilateral knees to prevent buckling. Stand to Sit: 1: +2 Total assist;Patient percentage (comment);To chair/3-in-1 (+2 total assist (pt=70%)) Stand to Sit Details: Assist for trunk to slow descent/eccentric control of quadriceps.  Cues for safest hand placement with  mobility. Ambulation/Gait Ambulation/Gait: Yes Ambulation/Gait Assistance: 1: +2 Total assist;Patient percentage (comment) (+2 total assist (pt=70%)) Ambulation/Gait Assistance Details (indicate cue type and reason): Assist for balance with cues to extend at hips and trunk.  Pt with tendency to ambulate in squated stance due to proximal weakness.  Chair follow for safety. Ambulation Distance (Feet): 50 Feet Assistive device: 2 person hand held assist Gait Pattern: Decreased step length - right;Decreased step length - left;Right foot flat;Left foot flat;Right flexed knee in stance;Left flexed knee in stance;Shuffle;Ataxic;Trunk flexed Gait velocity: 0.54 feet/sec (Indicates increased risk for falls.) Stairs: No Wheelchair Mobility Wheelchair Mobility: No  ADL: ADL Eating/Feeding: Minimal assistance;Simulated Eating/Feeding Details (indicate cue type and reason): Patient with greater difficulty getting left hand to mouth compared to right  Where Assessed - Eating/Feeding: Edge of bed Grooming: Simulated;Moderate assistance Where Assessed - Grooming:  (standing at EOB) Upper Body Bathing: Minimal assistance;Simulated Where Assessed - Upper Body Bathing: Sitting, bed Lower Body Bathing: Minimal assistance;Simulated Where Assessed - Lower Body Bathing: Sit to stand from chair Upper Body Dressing: Performed;Moderate assistance Where Assessed - Upper Body Dressing: Sitting, bed Lower Body Dressing: Performed;Set up Lower Body Dressing Details (indicate cue type and reason): don socks Where Assessed - Lower Body Dressing: Sitting, bed Toilet Transfer: Simulated;+2 Total assistance Toilet Transfer Details (indicate cue type and reason): pt=70% with ambulation with bilateral hand held assist. bilateral knees ocassionally buckling- pt able to self correct as long as patient weight bearing through UEs Toilet Transfer Method: Ambulating  Cognition: Cognition Arousal/Alertness:  Awake/alert Orientation Level: Oriented X4 Cognition Arousal/Alertness: Awake/alert Overall Cognitive Status: Appears within functional limits for tasks assessed Orientation Level: Oriented X4  Blood pressure 138/62, pulse 55, temperature 98.4 F (36.9 C), temperature source Oral, resp. rate 19, height 5\' 9"  (1.753 m), weight 65 kg (143 lb 4.8 oz), SpO2 98.00%. Physical Exam  Constitutional: He is oriented to person, place, and time. He appears well-developed.  HENT:  Head: Normocephalic.  Neck: Neck supple. No thyromegaly present.  Cardiovascular:       Irregular irregular  Pulmonary/Chest: No respiratory distress.  Abdominal: He exhibits no distension. There is no tenderness.  Musculoskeletal: He exhibits no edema.  Lymphadenopathy:    He has no cervical adenopathy.  Neurological: He is alert and oriented to person, place, and time.  Skin: Skin is warm and dry.  Psychiatric: Thought content normal.   Manual muscle testing reveals 1/5 bilateral deltoids 3+ bilateral bicep and tricep  as well as 4/5 grip. In the lower extremities patient has 2 minus in the hip flexors 4 minus in the hip extensors quadriceps and ankle dorsiflexors Sensory exam is normal bilateral upper and lower extremities. New Deep tendon reflexes are normal bilateral upper and lower extremities.  There is no evidence of muscle fasciculations Results for orders placed during the hospital encounter of 05/08/11 (from the past 24 hour(s))  HEPARIN LEVEL (UNFRACTIONATED)     Status: Normal   Collection Time   05/12/11  3:54 AM      Component Value Range   Heparin Unfractionated 0.34  0.30 - 0.70 (IU/mL)  CBC     Status: Abnormal   Collection Time   05/12/11  3:54 AM      Component Value Range   WBC 8.5  4.0 - 10.5 (K/uL)   RBC 3.77 (*) 4.22 - 5.81 (MIL/uL)   Hemoglobin 11.5 (*) 13.0 - 17.0 (g/dL)   HCT 16.1 (*) 09.6 - 52.0 (%)   MCV 91.2  78.0 - 100.0 (fL)   MCH 30.5  26.0 - 34.0 (pg)   MCHC 33.4  30.0 -  36.0 (g/dL)   RDW 04.5  40.9 - 81.1 (%)   Platelets 146 (*) 150 - 400 (K/uL)   Ct Head Wo Contrast  05/11/2011  *RADIOLOGY REPORT*  Clinical Data: Fall striking occiput, soft tissue swelling at occiput, on Coumadin  CT HEAD WITHOUT CONTRAST  Technique:  Contiguous axial images were obtained from the base of the skull through the vertex without contrast.  Comparison: None  Findings: Cava septum pellucidum and vergae. Minimal atrophy. Otherwise normal ventricular morphology. No midline shift or mass effect. Small vessel chronic ischemic changes of deep cerebral white matter. Scattered dural calcifications in falx. No intracranial hemorrhage, mass lesion, or evidence of acute infarction. No definite extra-axial fluid collections. Visualized paranasal sinuses and mastoid air cells clear. Skull intact.  IMPRESSION: Atrophy with small vessel chronic ischemic changes of deep cerebral white matter. No acute intracranial abnormalities.  Original Report Authenticated By: Lollie Marrow, M.D.    Assessment/Plan: Diagnosis: Cervical myelitis with quadriparesis and neurogenic bowel and bladder 1. Does the need for close, 24 hr/day medical supervision in concert with the patient's rehab needs make it unreasonable for this patient to be served in a less intensive setting? Yes 2. Co-Morbidities requiring supervision/potential complications: Atrial flutter as well as bradycardia, seizure disorder, history of traumatic brain injury 3. Due to bladder management, bowel management, safety and skin/wound care, does the patient require 24 hr/day rehab nursing? Potentially 4. Does the patient require coordinated care of a physician, rehab nurse, PT (1-2 hrs/day, 5 days/week) and OT (1-2 hrs/day, 5 days/week) to address physical and functional deficits in the context of the above medical diagnosis(es)? Potentially Addressing deficits in the following areas: balance, bathing, dressing, grooming, locomotion, strength, toileting  and transferring 5. Can the patient actively participate in an intensive therapy program of at least 3 hrs of therapy per day at least 5 days per week? Potentially 6. The potential for patient to make measurable gains while on inpatient rehab is excellent 7. Anticipated functional outcomes upon discharge from inpatients are modified independent mobility PT, modified independent ADLs OT, not applicable SLP 8. Estimated rehab length of stay to reach the above functional goals is: 10 days 9. Does the patient have adequate social supports to accommodate these discharge functional goals? Yes 10. Anticipated D/C setting: Home 11. Anticipated post D/C treatments: HH therapy 12. Overall Rehab/Functional Prognosis: excellent  RECOMMENDATIONS: This patient's condition is appropriate for continued rehabilitative care in the following setting: CIR Patient has agreed to participate in recommended program. Yes Note that insurance prior authorization may be required for reimbursement for recommended care.  Comment: Rehabilitation nurse will evaluate the patient on 12 3 to see if there is been any change in functional status. If the patient improves to a supervision level will be only go home with home health, if not then remains appropriate for inpatient rehabilitation   ANGIULLI,DANIEL J. 05/12/2011

## 2011-05-12 NOTE — Progress Notes (Signed)
Physical Therapy Evaluation Patient Details Name: Jay Walters MRN: [id number] DOB: [birth year] Today's Date: 05/12/2011  Problem List:  Patient Active Problem List  Diagnoses  . GERD  . DYSPHAGIA  . Atrial flutter  . Seizure disorder  . BPH (benign prostatic hyperplasia)  . Migraine headache  . Osteopenia  . Vitamin D deficiency  . Osteoarthritis of right knee  . Cervical myelopathy  . S/P atrial septal defect closure, surgical  . Bradycardia    Past Medical History:  Past Medical History  Diagnosis Date  . GERD (gastroesophageal reflux disease)   . Dysphagia     with solids  . Seizures   . Hypertension   . Hiatal hernia   . Esophageal stricture   . Atrial fibrillation   . Diverticulosis   . Osteopenia   . Hemorrhoids    Past Surgical History:  Past Surgical History  Procedure Date  . Asd repair [id number]  . Knee arthroscopy   . Inguinal hernia repair 2011    Right    PT Assessment/Plan/Recommendation PT Assessment Clinical Impression Statement: Pt is a 77 y/o male admitted with weakness proximally with a questionable demylenation process along with the below PT problem list.  Pt would benefit from PT acutely to decrease burden of care and facilitate d/c to next venue. PT Recommendation/Assessment: Patient will need skilled PT in the acute care venue PT Problem List: Decreased strength;Decreased activity tolerance;Decreased balance;Decreased mobility;Impaired sensation Barriers to Discharge: Decreased caregiver support (Wife works.) PT Therapy Diagnosis : Difficulty walking;Abnormality of gait PT Plan PT Frequency: Min 3X/week PT Treatment/Interventions: Gait training;Functional mobility training;Balance training;Neuromuscular re-education;Patient/family education PT Recommendation Recommendations for Other Services: Rehab consult Follow Up Recommendations: Inpatient Rehab Equipment Recommended: Defer to next venue PT Goals  Acute Rehab PT Goals PT Goal  Formulation: With patient Time For Goal Achievement: 2 weeks Pt will go Supine/Side to Sit: with modified independence PT Goal: Supine/Side to Sit - Progress: Other (comment) (Set today.) Pt will go Sit to Supine/Side: with modified independence PT Goal: Sit to Supine/Side - Progress: Other (comment) (Set today.) Pt will Transfer Sit to Stand/Stand to Sit: with supervision PT Transfer Goal: Sit to Stand/Stand to Sit - Progress: Other (comment) (Set today.) Pt will Ambulate: >150 feet;with supervision;with least restrictive assistive device PT Goal: Ambulate - Progress: Other (comment) (Set today.) Additional Goals Additional Goal #1: Pt will increase gait speed to 0.84 feet/sec to show decrease risk for falls. PT Goal: Additional Goal #1 - Progress: Other (comment) (Set today.)  PT Evaluation Precautions/Restrictions  Precautions Precautions: Fall Required Braces or Orthoses: No Restrictions Weight Bearing Restrictions: No Prior Functioning  Home Living Lives With: Spouse;Son Type of Home: House Home Layout: Two level;Able to live on main level with bedroom/bathroom Alternate Level Stairs-Rails: Right;Left;Can reach both Alternate Level Stairs-Number of Steps: 13 Home Access: Stairs to enter Entrance Stairs-Rails: Can reach both;Left;Right Entrance Stairs-Number of Steps: 11 Bathroom Shower/Tub: Tub only;Walk-in shower;Curtain;Door [employer] Toilet: Standard Home Adaptive Equipment: Crutches Prior Function Level of Independence: Independent with basic ADLs;Independent with homemaking with ambulation;Independent with gait;Independent with transfers Able to Take Stairs?: Reciprically Driving: Yes Vocation: Part time employment 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 Leisure: Hobbies-yes (Comment) Comments: golf Cognition Cognition Arousal/Alertness: Awake/alert Overall Cognitive Status: Appears within functional  limits for tasks assessed Orientation Level: Oriented X4 Sensation/Coordination Sensation Light Touch: Impaired Detail Light Touch Impaired Details: Impaired LLE (Tingling from ankle down to toes.) Stereognosis: Not tested Hot/Cold: Not tested  Proprioception: Not tested Additional Comments: pt report "burning/tingling" sensation starting in bilateral shoulders and progressing distally to ~knuckles on bilateral hands Coordination Gross Motor Movements are Fluid and Coordinated: No Fine Motor Movements are Fluid and Coordinated: Not tested Coordination and Movement Description: Some ataxia due to proximal weakness. Extremity Assessment RUE Assessment RUE Assessment: Not tested LUE Assessment LUE Assessment: Not tested RLE Assessment RLE Assessment: Exceptions to [facility] RLE Strength Right Hip Flexion: 3/5 Right Hip Extension: 3/5 Right Knee Flexion: 4/5 Right Knee Extension: 3+/5 Right Ankle Dorsiflexion: 4/5 Right Ankle Plantar Flexion: 4/5 LLE Assessment LLE Assessment: Exceptions to [facility] LLE Strength Left Hip Flexion: 3+/5 Left Hip Extension: 3+/5 Left Knee Flexion: 3+/5 Left Knee Extension: 4/5 Left Ankle Dorsiflexion: 4/5 Left Ankle Plantar Flexion: 4/5 Pain 0/10 pain with treatment. Mobility (including Balance) Bed Mobility Bed Mobility: Yes Supine to Sit: 5: Supervision;HOB flat;With rails Supine to Sit Details (indicate cue type and reason): Verbal cues for safest sequence. Transfers Transfers: Yes Sit to Stand: 1: +2 Total assist;Patient percentage (comment);From bed (+2 total assist (pt=70%)) Sit to Stand Details (indicate cue type and reason): Assist for balance with translation of trunk over BOS.  Blocked bilateral knees to prevent buckling. Stand to Sit: 1: +2 Total assist;Patient percentage (comment);To chair/3-in-1 (+2 total assist (pt=70%)) Stand to Sit Details: Assist for trunk to slow descent/eccentric control of quadriceps.  Cues for safest hand placement  with mobility. Ambulation/Gait Ambulation/Gait: Yes Ambulation/Gait Assistance: 1: +2 Total assist;Patient percentage (comment) (+2 total assist (pt=70%)) Ambulation/Gait Assistance Details (indicate cue type and reason): Assist for balance with cues to extend at hips and trunk.  Pt with tendency to ambulate in squated stance due to proximal weakness.  Chair follow for safety. Ambulation Distance (Feet): 50 Feet Assistive device: 2 person hand held assist Gait Pattern: Decreased step length - right;Decreased step length - left;Right foot flat;Left foot flat;Right flexed knee in stance;Left flexed knee in stance;Shuffle;Ataxic;Trunk flexed Gait velocity: 0.54 feet/sec (Indicates increased risk for falls.) Stairs: No Wheelchair Mobility Wheelchair Mobility: No  Posture/Postural Control Posture/Postural Control: No significant limitations Balance Balance Assessed: Yes Static Standing Balance Static Standing - Balance Support: Bilateral upper extremity supported (Progressing to no upper extremity support.) Static Standing - Level of Assistance: 1: +2 Total assist;Patient percentage (comment) (+2 total assist (pt=70%)) Static Standing - Comment/# of Minutes: 2 Dynamic Standing Balance Dynamic Standing - Balance Support: Bilateral upper extremity supported (Progressed to no upper extremity support.) Dynamic Standing - Level of Assistance: 1: +2 Total assist;Patient percentage (comment) (+2 total assist (pt=70%)) Dynamic Standing - Balance Activities: Reaching for objects;Reaching across midline Exercise    End of Session PT - End of Session Equipment Utilized During Treatment: Gait belt Activity Tolerance: Patient tolerated treatment well Patient left: in chair;with call bell in reach Nurse Communication: Mobility status for transfers;Mobility status for ambulation General Behavior During Session: [facility] for tasks performed Cognition: [name] for tasks performed Co-evaluation with OT.  Cephus Shelling 05/12/2011, 11:48 AM  05/12/2011 Cephus Shelling, PT, DPT [phone]

## 2011-05-12 NOTE — Progress Notes (Signed)
Occupational Therapy Evaluation Patient Details Name: Jay Walters MRN: [id number] DOB: [birth year] Today's Date: 05/12/2011  Problem List:  Patient Active Problem List  Diagnoses  . GERD  . DYSPHAGIA  . Atrial flutter  . Seizure disorder  . BPH (benign prostatic hyperplasia)  . Migraine headache  . Osteopenia  . Vitamin D deficiency  . Osteoarthritis of right knee  . Cervical myelopathy  . S/P atrial septal defect closure, surgical  . Bradycardia    Past Medical History:  Past Medical History  Diagnosis Date  . GERD (gastroesophageal reflux disease)   . Dysphagia     with solids  . Seizures   . Hypertension   . Hiatal hernia   . Esophageal stricture   . Atrial fibrillation   . Diverticulosis   . Osteopenia   . Hemorrhoids    Past Surgical History:  Past Surgical History  Procedure Date  . Asd repair [id number]  . Knee arthroscopy   . Inguinal hernia repair 2011    Right    OT Assessment/Plan/Recommendation OT Assessment Clinical Impression Statement: Patient presents with decreased UE proximal strength as well as decrease LE strength which impacts patient's indepdence with ADL and ADL mobility. Will benefit from skilled OT in the the acute care setting to maximize independence with ADL and ADL mobility to Mod I/I level upon d/c.  OT Recommendation/Assessment: Patient will need skilled OT in the acute care venue OT Problem List: Decreased strength;Decreased range of motion;Impaired balance (sitting and/or standing);Decreased knowledge of use of DME or AE;Decreased knowledge of precautions;Impaired sensation;Impaired UE functional use OT Therapy Diagnosis : Generalized weakness;Paresis OT Plan OT Frequency: Min 2X/week OT Recommendation Recommendations for Other Services: Rehab consult Follow Up Recommendations: Inpatient Rehab Equipment Recommended: Defer to next venue Individuals Consulted Consulted and Agree with Results and Recommendations: Patient OT  Goals Acute Rehab OT Goals OT Goal Formulation: With patient Time For Goal Achievement: 2 weeks ADL Goals Pt Will Perform Eating: with modified independence;with adaptive utensils (AE prn) ADL Goal: Eating - Progress: Other (comment) Pt Will Perform Grooming: Independently;Sitting at sink (or standing) ADL Goal: Grooming - Progress: Other (comment) Pt Will Perform Upper Body Dressing: with modified independence;Independently;Sitting, bed (Mod I with item retrieval) ADL Goal: Upper Body Dressing - Progress: Other (comment) Pt Will Perform Lower Body Dressing: Independently;with modified independence;Sit to stand from bed (Mod I with item retrieval) ADL Goal: Lower Body Dressing - Progress: Other (comment) Pt Will Transfer to Toilet: with modified independence;with DME;Ambulation ADL Goal: Toilet Transfer - Progress: Other (comment)  OT Evaluation Precautions/Restrictions  Precautions Precautions: Fall Required Braces or Orthoses: No Restrictions Weight Bearing Restrictions: No Prior Functioning Home Living Lives With: Spouse;Son Type of Home: House Home Layout: Two level;Able to live on main level with bedroom/bathroom Alternate Level Stairs-Rails: Right;Left;Can reach both Alternate Level Stairs-Number of Steps: 13 Home Access: Stairs to enter Entrance Stairs-Rails: Can reach both;Left;Right Entrance Stairs-Number of Steps: 11 Bathroom Shower/Tub: Tub only;Walk-in shower;Curtain;Door [employer] Toilet: Standard Home Adaptive Equipment: Crutches Prior Function Level of Independence: Independent with basic ADLs;Independent with homemaking with ambulation;Independent with gait;Independent with transfers Able to Take Stairs?: Reciprically Driving: Yes Vocation: Part time employment 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 Leisure: Hobbies-yes (Comment) Comments: golf ADL ADL Eating/Feeding: Minimal  assistance;Simulated Eating/Feeding Details (indicate cue type and reason): Patient with greater difficulty getting left hand to mouth compared to right  Where Assessed - Eating/Feeding: Edge of bed Grooming: Simulated;Moderate assistance Where Assessed -  Grooming:  (standing at EOB) Upper Body Bathing: Minimal assistance;Simulated Where Assessed - Upper Body Bathing: Sitting, bed Lower Body Bathing: Minimal assistance;Simulated Where Assessed - Lower Body Bathing: Sit to stand from chair Upper Body Dressing: Performed;Moderate assistance Where Assessed - Upper Body Dressing: Sitting, bed Lower Body Dressing: Performed;Set up Lower Body Dressing Details (indicate cue type and reason): don socks Where Assessed - Lower Body Dressing: Sitting, bed Toilet Transfer: Simulated;+2 Total assistance Toilet Transfer Details (indicate cue type and reason): pt=70% with ambulation with bilateral hand held assist. bilateral knees ocassionally buckling- pt able to self correct as long as patient weight bearing through UEs Toilet Transfer Method: Ambulating Vision/Perception  Vision - History Baseline Vision: Wears glasses only for reading Patient Visual Report: No change from baseline Vision - Assessment Eye Alignment: Within Functional Limits Vision Assessment: Vision not tested Cognition Cognition Arousal/Alertness: Awake/alert Overall Cognitive Status: Appears within functional limits for tasks assessed Orientation Level: Oriented X4 Sensation/Coordination Sensation Light Touch: Impaired Detail Light Touch Impaired Details: Impaired LLE (Tingling from ankle down to toes.) Stereognosis: Not tested Hot/Cold: Not tested Proprioception: Not tested Additional Comments: pt report "burning/tingling" sensation starting in bilateral shoulders and progressing distally to ~knuckles on bilateral hands Coordination Gross Motor Movements are Fluid and Coordinated: No Fine Motor Movements are Fluid and  Coordinated: Not tested Coordination and Movement Description: Some ataxia due to proximal weakness. Extremity Assessment RUE Assessment RUE Assessment: Not tested RUE AROM (degrees) RUE Overall AROM Comments: AROM WFL except for shoulder flexion which is limited by decreased strength to ~50degrees RUE PROM (degrees) RUE Overall PROM Comments:  (Able to achieve full shoulder flexion PROM) RUE Strength RUE Overall Strength: Within Functional Limits for tasks performed;Deficits (elbow and distally strength is [facility]) Right Shoulder Flexion: 2+/5 Gross Grasp: Functional LUE Assessment LUE Assessment: Not tested LUE AROM (degrees) LUE Overall AROM Comments: AROM for elbow and distally WFL. Left Shoulder Extension  0-60: 40 Degrees LUE PROM (degrees) LUE Overall PROM Comments: Able to achieve full should flexion PROM  LUE Strength LUE Overall Strength Comments: Overall strength WFL for elbow and distally Left Shoulder Flexion: 2+/5 Gross Grasp: Functional Mobility  Bed Mobility Bed Mobility: Yes Supine to Sit: 5: Supervision;HOB flat;With rails Supine to Sit Details (indicate cue type and reason): Verbal cues for safest sequence. Transfers Sit to Stand: 1: +2 Total assist;Patient percentage (comment);From bed (+2 total assist (pt=70%)) Sit to Stand Details (indicate cue type and reason): Assist for balance with translation of trunk over BOS.  Blocked bilateral knees to prevent buckling. Stand to Sit: 1: +2 Total assist;Patient percentage (comment);To chair/3-in-1 (+2 total assist (pt=70%)) Stand to Sit Details: Assist for trunk to slow descent/eccentric control of quadriceps.  Cues for safest hand placement with mobility. End of Session OT - End of Session Equipment Utilized During Treatment: Gait belt Activity Tolerance: Patient tolerated treatment well Patient left: in chair;with call bell in reach Nurse Communication: Mobility status for transfers;Mobility status for  ambulation General Behavior During Session: [facility] for tasks performed Cognition: [facility] for tasks performed   Pierina Schuknecht 05/12/2011, 11:59 AM

## 2011-05-12 NOTE — Consult Note (Signed)
ANTICOAGULATION CONSULT NOTE - Follow Up Consult  Pharmacy Consult for Heparin / Coumadin Indication: atrial flutter and possible clots in carotid arteries  No Known Allergies  Patient Measurements: Height: 5\' 9"  (175.3 cm) Weight: 143 lb 4.8 oz (65 kg) IBW/kg (Calculated) : 70.7   Vital Signs: Temp: 98.4 F (36.9 C) (11/30 0800) Temp src: Oral (11/30 0800) BP: 123/72 mmHg (11/30 1200) Pulse Rate: 50  (11/30 1200)  Labs:  Basename 05/12/11 0354 05/11/11 0505 05/11/11 05/10/11 0640  HGB 11.5* 11.5* -- --  HCT 34.4* 34.8* -- 35.9*  PLT 146* 141* -- 149*  APTT -- -- -- --  LABPROT -- 16.8* -- --  INR -- 1.34 -- --  HEPARINUNFRC 0.34 0.45 0.55 --  CREATININE -- -- -- --  CKTOTAL -- -- -- --  CKMB -- -- -- --  TROPONINI -- -- -- --   Estimated Creatinine Clearance: 71.1 ml/min (by C-G formula based on Cr of 0.74).  Medical History: Past Medical History  Diagnosis Date  . GERD (gastroesophageal reflux disease)   . Dysphagia     with solids  . Seizures   . Hypertension   . Hiatal hernia   . Esophageal stricture   . Atrial fibrillation   . Diverticulosis   . Osteopenia   . Hemorrhoids    Medications:  Scheduled:     . aspirin  324 mg Oral QPM  . cholecalciferol  2,000 Units Oral Daily  . doxazosin  4 mg Oral QPM  . finasteride  5 mg Oral QPM  . methylPREDNISolone (SOLU-MEDROL) injection  1,000 mg Intravenous QHS  . multivitamins ther. w/minerals  1 tablet Oral Daily  . pantoprazole  40 mg Oral QPM  . phenytoin  300 mg Oral QHS  . rosuvastatin  20 mg Oral Daily   Assessment: 77 yo male with atrial flutter and possible clots in bilateral carotid arteries. Heparin level is at-goal. Coumadin per pharmacy to be started today.  Goal of Therapy:  Heparin level 0.3-0.7 units/ml   Plan:  1. Continue heparin IV infusion at 1200 units/hr.  2. Coumadin 5mg  po today.  3. F/u AM labs  Lorre Munroe, PharmD  05/12/2011 2:32 PM

## 2011-05-12 NOTE — Progress Notes (Signed)
Subjective: Patient remains pain-free. Strength improved. Pulse ranges from 42 to 75.  Had a fall yesterday.  No complaints  from such today.  Head CT performed on yesterday after the fall shows no evidence of hemorrhage.  Now s/p day 4 of 5 treatments.  Objective: Vital signs in last 24 hours: Temp:  [97.6 F (36.4 C)-98.6 F (37 C)] 98.6 F (37 C) (11/29 2003) Pulse Rate:  [42-75] 63  (11/30 0900) Resp:  [16-21] 19  (11/30 0600) BP: (101-158)/(48-82) 134/70 mmHg (11/30 0900) SpO2:  [94 %-99 %] 95 % (11/30 0900)  Intake/Output from previous day: 11/29 0701 - 11/30 0700 In: 1620 [I.V.:1620] Out: 975 [Urine:975] Intake/Output this shift: Total I/O In: 381 [I.V.:381] Out: 225 [Urine:225] Nutritional status: Cardiac  Neurologic Exam: Mental Status: Alert, oriented, thought content appropriate. Speech fluent without evidence of aphasia. Able to follow 3 step commands without difficulty.  Cranial Nerves:  II: visual fields grossly normal, pupils equal, round, reactive to light and accommodation  III,IV, VI: ptosis not present, extraocular muscles extra-ocular motions intact bilaterally  V,VII: smile symmetric, facial light touch sensation normal bilaterally  VIII: hearing normal bilaterally  IX,X: gag reflex present  XI: trapezius strength decreased bilaterally  XII: tongue strength normal  Motor:  Right Upper extremity - 5/5 hand grip with 3+/5 strength proximally. Patient able to lift arm to about 170 degrees without assistance.  Left Upper extremity - 5/5 hand grip with 3/5 strength proximally. Patient able to lift arm to about 75 degrees without assistance. Per report of nursing was up beyond 90 degrees earlier today.  Right Lower extremity - 5/5 plantar flexion and extension, 4-/5 proximally with patient able to lift leg easily beyond 45 degrees  Left Lower extremity - 5/5 plantar flexion and extension, 4+/5 proximally with patient able to lift leg easily beyond 45 degrees    Tone and bulk:normal tone throughout; no atrophy noted  Sensory: Pinprick and light touch intact throughout in the lower extremities, decreased in the fingertips bilaterally  Deep Tendon Reflexes: 1+ at the biceps and absent otherwise  Plantars:  Right: mute Left: mute  Cerebellar:  Not performed   Lab Results:  Basename 05/12/11 0354 05/11/11 0505 05/09/11 1020  WBC 8.5 5.8 --  HGB 11.5* 11.5* --  HCT 34.4* 34.8* --  PLT 146* 141* --  NA -- -- 138  K -- -- 3.7  CL -- -- 104  CO2 -- -- 25  GLUCOSE -- -- 137*  BUN -- -- 14  CREATININE -- -- 0.74  CALCIUM -- -- 8.7  LABA1C -- -- --    Studies/Results: Ct Head Wo Contrast  05/11/2011  *RADIOLOGY REPORT*  Clinical Data: Fall striking occiput, soft tissue swelling at occiput, on Coumadin  CT HEAD WITHOUT CONTRAST  Technique:  Contiguous axial images were obtained from the base of the skull through the vertex without contrast.  Comparison: None  Findings: Cava septum pellucidum and vergae. Minimal atrophy. Otherwise normal ventricular morphology. No midline shift or mass effect. Small vessel chronic ischemic changes of deep cerebral white matter. Scattered dural calcifications in falx. No intracranial hemorrhage, mass lesion, or evidence of acute infarction. No definite extra-axial fluid collections. Visualized paranasal sinuses and mastoid air cells clear. Skull intact.  IMPRESSION: Atrophy with small vessel chronic ischemic changes of deep cerebral white matter. No acute intracranial abnormalities.  Original Report Authenticated By: Lollie Marrow, M.D.    Medications:  I have reviewed the patient's current medications. Scheduled:   . aspirin  324 mg Oral QPM  . cholecalciferol  2,000 Units Oral Daily  . doxazosin  4 mg Oral QPM  . finasteride  5 mg Oral QPM  . methylPREDNISolone (SOLU-MEDROL) injection  1,000 mg Intravenous QHS  . multivitamins ther. w/minerals  1 tablet Oral Daily  . pantoprazole  40 mg Oral QPM  .  phenytoin  300 mg Oral QHS  . rosuvastatin  20 mg Oral Daily    Assessment/Plan:  Patient Active Hospital Problem List: Cervical myelopathy (05/08/2011)   Assessment: Patient remains on steroids.  Anticipate 5 days.  Strength improving.   Plan: 1. Therapy with CIR consult Atrial flutter (04/18/2011)   Assessment: Patient stable.  Not considered an ablation candidate at this time.    Plan: 1. Transfer to 3000 on tele            2.  Change to Coumadin            3.  Cardiology following Carotid thrombus-bilateral   Assessment: stable   Plan: On heparin to be changed to Coumadin   LOS: 4 days   Thana Farr, MD [facility] [phone] 05/12/2011  10:02 AM

## 2011-05-12 NOTE — Progress Notes (Signed)
Subjective: No chest pain.  Objective: Vital signs in last 24 hours: Temp:  [97.6 F (36.4 C)-98.6 F (37 C)] 98.4 F (36.9 C) (11/30 0800) Pulse Rate:  [42-75] 70  (11/30 1000) Resp:  [16-21] 19  (11/30 0600) BP: (101-158)/(48-84) 143/84 mmHg (11/30 1000) SpO2:  [94 %-99 %] 98 % (11/30 1000) Weight change:  Last BM Date: 05/10/11 Intake/Output from previous day: +732 11/29 0701 - 11/30 0700 In: 1620 [I.V.:1620] Out: 975 [Urine:975] Intake/Output this shift: Total I/O In: 468 [I.V.:468] Out: 225 [Urine:225]  PE: General:NAD Heart:irr rhythm on gross murmur Lungs:clearnad Neuro:talking working with PT   Lab Results:  Basename 05/12/11 0354 05/11/11 0505  WBC 8.5 5.8  HGB 11.5* 11.5*  HCT 34.4* 34.8*  PLT 146* 141*   BMET    Lab Results  Component Value Date   TSH 4.185 04/18/2011    Hepatic Function Panel No results found for this basename: PROT,ALBUMIN,AST,ALT,ALKPHOS,BILITOT,BILIDIR,IBILI in the last 72 hours No results found for this basename: CHOL in the last 72 hours No results found for this basename: PROTIME in the last 72 hours    EKG: Orders placed during the hospital encounter of 05/08/11  . EKG 12-LEAD  . EKG 12-LEAD    Studies/Results: Ct Head Wo Contrast  05/11/2011  *RADIOLOGY REPORT*  Clinical Data: Fall striking occiput, soft tissue swelling at occiput, on Coumadin  CT HEAD WITHOUT CONTRAST  Technique:  Contiguous axial images were obtained from the base of the skull through the vertex without contrast.  Comparison: None  Findings: Cava septum pellucidum and vergae. Minimal atrophy. Otherwise normal ventricular morphology. No midline shift or mass effect. Small vessel chronic ischemic changes of deep cerebral white matter. Scattered dural calcifications in falx. No intracranial hemorrhage, mass lesion, or evidence of acute infarction. No definite extra-axial fluid collections. Visualized paranasal sinuses and mastoid air cells clear. Skull  intact.  IMPRESSION: Atrophy with small vessel chronic ischemic changes of deep cerebral white matter. No acute intracranial abnormalities.  Original Report Authenticated By: Lollie Marrow, M.D.    Medications: I have reviewed the patient's current medications.  Assessment/Plan: Patient Active Problem List  Diagnoses  . GERD  . DYSPHAGIA  . Atrial flutter  . Seizure disorder  . BPH (benign prostatic hyperplasia)  . Migraine headache  . Osteopenia  . Vitamin D deficiency  . Osteoarthritis of right knee  . Cervical myelopathy  . S/P atrial septal defect closure, surgical  . Bradycardia    Principal Problem:  *Cervical myelopathy Active Problems:  Atrial flutter  S/P atrial septal defect closure, surgical  Bradycardia, asymptomatic  ? Carotid thrombus by MRA  On coumadin/Heparin crossover.   Agree with above  Still in AF  Not symptomatic Ablation in near future by Dr. Johney Frame    LOS: 4 days   INGOLD,LAURA R 05/12/2011, 10:58 AM  ,  B 11:09 AM

## 2011-05-13 LAB — CBC
MCV: 90.5 fL (ref 78.0–100.0)
Platelets: 153 10*3/uL (ref 150–400)
RBC: 3.9 MIL/uL — ABNORMAL LOW (ref 4.22–5.81)
RDW: 14.2 % (ref 11.5–15.5)
WBC: 8.3 10*3/uL (ref 4.0–10.5)

## 2011-05-13 LAB — PROTIME-INR: Prothrombin Time: 16.4 seconds — ABNORMAL HIGH (ref 11.6–15.2)

## 2011-05-13 MED ORDER — PREDNISONE 20 MG PO TABS: 30.0000 mg | ORAL_TABLET | Freq: Every day | ORAL | Status: DC

## 2011-05-13 MED ORDER — PREDNISOLONE 5 MG PO TABS: 10.0000 mg | ORAL_TABLET | Freq: Every day | ORAL | Status: DC

## 2011-05-13 MED ORDER — PREDNISOLONE 5 MG PO TABS: 20.0000 mg | ORAL_TABLET | Freq: Every day | ORAL | Status: DC

## 2011-05-13 MED ORDER — PREDNISONE 20 MG PO TABS: 40.0000 mg | ORAL_TABLET | Freq: Every day | ORAL | Status: DC

## 2011-05-13 MED ORDER — PREDNISONE 50 MG PO TABS
50.0000 mg | ORAL_TABLET | Freq: Every day | ORAL | Status: DC
  Administered 2011-05-16: 50 mg via ORAL
  Filled 2011-05-13 (×2): qty 1

## 2011-05-13 MED ORDER — WARFARIN SODIUM 5 MG PO TABS
5.0000 mg | ORAL_TABLET | Freq: Once | ORAL | Status: AC
  Administered 2011-05-13: 5 mg via ORAL
  Filled 2011-05-13: qty 1

## 2011-05-13 MED ORDER — PREDNISONE 50 MG PO TABS
60.0000 mg | ORAL_TABLET | Freq: Every day | ORAL | Status: AC
  Administered 2011-05-14 – 2011-05-15 (×2): 60 mg via ORAL
  Filled 2011-05-13 (×3): qty 1

## 2011-05-13 NOTE — Progress Notes (Signed)
Subjective: Pt says he can raise arms to chest level today, but not satisfied.  Wants to be back to 'normal'.  No pain, difficulty swallowing or blurred VA.  Has some tingling in the distal third of fingertips.  Walked with physical therapy yesterday.  Day 5/5 IV Solumedrol for suspected transverse myelitis.  Still has foley in place.  Prior to hospitalization, pt had urinary frequency, but no incontinence.  Had 2 episodes of incontinence here and a bladder scan showed >350 post-void residual.  Was on Proscar and Cardura as an outpatient.  Objective: Blood pressure 122/61, pulse 53, temperature 98.9 F (37.2 C), temperature source Oral, resp. rate 18, height 5\' 9"  (1.753 m), weight 64.4 kg (141 lb 15.6 oz), SpO2 94.00%.  Intake/Output Summary (Last 24 hours) at 05/13/11 0934 Last data filed at 05/13/11 0700  Gross per 24 hour  Intake   2605 ml  Output   2750 ml  Net   -145 ml   Meds Reviewed: ASA 325mg  Solumedrol 1000mg  IV QHS Phenytoin 300 mg QHS Rouvastatin 20 mg QD Pantoprazole 40 mg QD Finasteride 5 mg QHS Doxazosin 4 mg QPM Cholecalciferol 2000 Units OQ  Exam: Mental Status:  Alert, oriented, thought content appropriate. Speech fluent without evidence of aphasia. Able to follow 3 step commands without difficulty.  Cranial Nerves:  II: visual fields grossly normal, pupils equal, round, reactive to light and accommodation  III,IV, VI: ptosis not present, extraocular muscles extra-ocular motions intact bilaterally  V,VII: smile symmetric, facial light touch sensation normal bilaterally  XI: trapezius strength decreased bilaterally  XII: tongue strength normal  Motor:  Right Upper extremity - 5/5 hand grip with 4+/5 strength proximally. Patient able to lift arm to about 175 degrees without assistance.  Left Upper extremity - 5/5 hand grip with 4/5 strength proximally. Patient able to lift arm to about 90 degrees without assistance.  Right Lower extremity - 5/5 plantar  flexion and extension, 4-/5 proximally with patient able to lift leg easily beyond 70 degrees  Left Lower extremity - 5/5 plantar flexion and extension, 4+/5 proximally with patient able to lift leg easily beyond 70 degrees  Tone and bulk:normal tone throughout; no atrophy noted  Sensory: Light touch intact throughout in the lower extremities, decreased in the fingertips bilaterally  Deep Tendon Reflexes: 1+ biceps bil.  Absent otherwise. Plantars: mute bilaterally.     Labs: INR 1.3 Hep 0.31 CSF Cell Count: clear, 2 RBC CSF: No WBC, No Organisms or growth in 3 days CSF PRO: 69 (h) CSF GLU: 66 CSF Oligoclonal bands: None CSF Angiotensin Converting Enzyme: 2 CSF Crypto Ag: Neg. CSF VDRL: Neg  Assessment 1. Cervical Myelopathy- most consistent with transverse myelitis. CSF is concordant.  Will complete 5/5 IV   solumedrol treatments today.  Has some improvement in upper and lower extremity strength.  Tingling   fingertips persist.  Will need ongoing rehab- see CIR's full consult note for details.   2. Urinary retention/incontinence may have been exacerbated by this event.  Consider d/c foley and monitor voiding  trials for improved bladder control in a few days.  Has had BM in hospital which patient reports normal for him.  No loss of bowel control.  May benefit from laxative/stool softener.  3. 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  Coumadin loading, on heparin. Will rec D/C aspirin once coumadin is therapeutic.  4. Aflutter-slow.  For op EP eval and poss RFA.  Plan: 1. Continue therapy with CIR re-eval sched for 12/3. 2. Oral  prednisone taper. 3. Coumadin per pharmacy.     LOS: 5 days   Marya Fossa PA-C [facility] [phone] 05/13/2011  9:34 AM

## 2011-05-13 NOTE — Progress Notes (Signed)
[facility] Progress Note  Subjective:  Feels better.  Muscle strength slightly improving.  No chest pain or sob.  Objective:   Vital Signs in the last 24 hours: Temp:  [98 F (36.7 C)-98.9 F (37.2 C)] 98.9 F (37.2 C) (12/01 0600) Pulse Rate:  [49-76] 53  (12/01 0600) Resp:  [18-21] 18  (12/01 0600) BP: (122-151)/(61-84) 122/61 mmHg (12/01 0600) SpO2:  [94 %-99 %] 94 % (12/01 0600) Weight:  [63.957 kg (141 lb)-64.4 kg (141 lb 15.6 oz)] 141 lb 15.6 oz (64.4 kg) (12/01 0600)  Intake/Output from previous day: 11/30 0701 - 12/01 0700 In: 2986 [P.O.:1450; I.V.:1536] Out: 2975 [Urine:2975]  Scheduled:   . aspirin  324 mg Oral QPM  . cholecalciferol  2,000 Units Oral Daily  . coumadin book  1 each Does not apply Once  . doxazosin  4 mg Oral QPM  . finasteride  5 mg Oral QPM  . methylPREDNISolone (SOLU-MEDROL) injection  1,000 mg Intravenous QHS  . multivitamins ther. w/minerals  1 tablet Oral Daily  . pantoprazole  40 mg Oral QPM  . phenytoin  300 mg Oral QHS  . rosuvastatin  20 mg Oral Daily  . warfarin  5 mg Oral ONCE-1800  . warfarin  1 each Does not apply Once   Continuous:   . heparin 1,200 Units/hr (05/12/11 2313)  . DISCONTD: sodium chloride Stopped (05/13/11 0500)   JXB:JYNWGN chloride, acetaminophen, senna-docusate, traMADol, traZODone  Physical Exam:   General appearance: alert, cooperative and no distress Neck: no adenopathy, no carotid bruit, no JVD, supple, symmetrical, trachea midline and thyroid not enlarged, symmetric, no tenderness/mass/nodules Lungs: clear to auscultation bilaterally Heart: regular rate and rhythm, S1, S2 normal, no murmur, click, rub or gallop Abdomen: soft, non-tender; bowel sounds normal; no masses,  no organomegaly Extremities: extremities normal, atraumatic, no cyanosis or edema   Rate:50-60  Rhythm: atrial flutter  Lab Results:  }No results found for this basename:  NA:2,K:2,CL:2,CO2:2,GLUCOSE:2,BUN:2,CREATININE:2 in the last 72 hours No results found for this basename: TROPONINI:2,CK,MB:2 in the last 72 hours Hepatic Function Panel No results found for this basename: PROT,ALBUMIN,AST,ALT,ALKPHOS,BILITOT,BILIDIR,IBILI in the last 72 hours     Assessment/Plan:   Principal Problem:  *Cervical myelopathy Active Problems:  Atrial flutter  S/P atrial septal defect closure, surgical  Bradycardia   Now getting coumadinized with heparin crossover.  No bleeding.    Lennette Bihari, MD, [facility] 05/13/2011, 9:32 AM

## 2011-05-13 NOTE — Progress Notes (Signed)
ANTICOAGULATION CONSULT NOTE - Follow Up Consult  Pharmacy Consult for Heparin/Coumadin Indication: aflutter  No Known Allergies  Patient Measurements: Height: 5\' 9"  (175.3 cm) Weight: 141 lb 15.6 oz (64.4 kg) IBW/kg (Calculated) : 70.7   Vital Signs: Temp: 98.2 F (36.8 C) (12/01 1435) BP: 117/62 mmHg (12/01 1435) Pulse Rate: 56  (12/01 1435)  Labs:  [facility] 05/13/11 0800 05/12/11 0354 05/11/11 0505  HGB 11.8* 11.5* --  HCT 35.3* 34.4* 34.8*  PLT 153 146* 141*  APTT -- -- --  LABPROT 16.4* -- 16.8*  INR 1.30 -- 1.34  HEPARINUNFRC 0.31 0.34 0.45  CREATININE -- -- --  CKTOTAL -- -- --  CKMB -- -- --  TROPONINI -- -- --   Estimated Creatinine Clearance: 70.4 ml/min (by C-G formula based on Cr of 0.74).   Medications:  Scheduled:    . aspirin  324 mg Oral QPM  . cholecalciferol  2,000 Units Oral Daily  . coumadin book  1 each Does not apply Once  . doxazosin  4 mg Oral QPM  . finasteride  5 mg Oral QPM  . methylPREDNISolone (SOLU-MEDROL) injection  1,000 mg Intravenous QHS  . multivitamins ther. w/minerals  1 tablet Oral Daily  . pantoprazole  40 mg Oral QPM  . phenytoin  300 mg Oral QHS  . prednisoLONE  10 mg Oral QPC breakfast  . prednisoLONE  20 mg Oral QPC breakfast  . predniSONE  30 mg Oral QPC breakfast  . predniSONE  40 mg Oral QPC breakfast  . predniSONE  50 mg Oral QPC breakfast  . predniSONE  60 mg Oral QPC breakfast  . rosuvastatin  20 mg Oral Daily  . warfarin  5 mg Oral ONCE-1800  . warfarin  5 mg Oral ONCE-1800  . warfarin  1 each Does not apply Once  . DISCONTD: prednisoLONE  10 mg Oral QPC breakfast  . DISCONTD: prednisoLONE  20 mg Oral QPC breakfast  . DISCONTD: predniSONE  30 mg Oral QPC breakfast  . DISCONTD: predniSONE  40 mg Oral QPC breakfast    Assessment: 77 y/o male patient receiving heparin/coumadin for aflutter. INR subtherapeutic, coumadin started yesterday, noted drug intxn with Dilantin. Therapeutic heparin level, on lower  side of goal, will increase rate. No bleeding reported.   Goal of Therapy:  INR 2-3 Heparin level 0.3-0.7 units/ml   Plan:  Increase heparin to 1250 units/hr and repeat coumadin 5mg  today, f/u in am.  Verlene Mayer, PharmD, BCPS Pager [phone]  05/13/2011,4:45 PM

## 2011-05-14 ENCOUNTER — Telehealth: Payer: Self-pay | Admitting: Neurology

## 2011-05-14 LAB — CBC
Hemoglobin: 10.9 g/dL — ABNORMAL LOW (ref 13.0–17.0)
MCH: 30.4 pg (ref 26.0–34.0)
MCHC: 33.4 g/dL (ref 30.0–36.0)
MCV: 91.1 fL (ref 78.0–100.0)
Platelets: 136 10*3/uL — ABNORMAL LOW (ref 150–400)

## 2011-05-14 LAB — PROTIME-INR: Prothrombin Time: 20.8 seconds — ABNORMAL HIGH (ref 11.6–15.2)

## 2011-05-14 MED ORDER — WARFARIN SODIUM 2.5 MG PO TABS
2.5000 mg | ORAL_TABLET | Freq: Once | ORAL | Status: AC
  Administered 2011-05-14: 2.5 mg via ORAL
  Filled 2011-05-14: qty 1

## 2011-05-14 NOTE — Progress Notes (Signed)
ANTICOAGULATION CONSULT NOTE - Follow Up Consult  Pharmacy Consult for Heparin/Coumadin Indication: aflutter  No Known Allergies  Patient Measurements: Height: 5\' 9"  (175.3 cm) Weight: 147 lb 11.3 oz (67 kg) IBW/kg (Calculated) : 70.7   Vital Signs: Temp: 99 F (37.2 C) (12/02 1345) Temp src: Oral (12/02 0600) BP: 97/36 mmHg (12/02 1345) Pulse Rate: 49  (12/02 1345)  Labs:  Basename 05/14/11 0630 05/13/11 0800 05/12/11 0354  HGB 10.9* 11.8* --  HCT 32.6* 35.3* 34.4*  PLT 136* 153 146*  APTT -- -- --  LABPROT 20.8* 16.4* --  INR 1.76* 1.30 --  HEPARINUNFRC 0.65 0.31 0.34  CREATININE -- -- --  CKTOTAL -- -- --  CKMB -- -- --  TROPONINI -- -- --   Estimated Creatinine Clearance: 73.3 ml/min (by C-G formula based on Cr of 0.74).   Medications:  Scheduled:     . aspirin  324 mg Oral QPM  . cholecalciferol  2,000 Units Oral Daily  . doxazosin  4 mg Oral QPM  . finasteride  5 mg Oral QPM  . multivitamins ther. w/minerals  1 tablet Oral Daily  . pantoprazole  40 mg Oral QPM  . phenytoin  300 mg Oral QHS  . prednisoLONE  10 mg Oral QPC breakfast  . prednisoLONE  20 mg Oral QPC breakfast  . predniSONE  30 mg Oral QPC breakfast  . predniSONE  40 mg Oral QPC breakfast  . predniSONE  50 mg Oral QPC breakfast  . predniSONE  60 mg Oral QPC breakfast  . rosuvastatin  20 mg Oral Daily  . warfarin  5 mg Oral ONCE-1800  . warfarin  1 each Does not apply Once    Assessment: 77 y/o male patient receiving heparin/coumadin for aflutter. INR subtherapeutic, coumadin started 11/30, noted drug intxn with Dilantin. Heparin level is at goal.  INR with considerable rise on day 3.  Goal of Therapy:  INR 2-3 Heparin level 0.3-0.7 units/ml   Plan:  -No heparin changes -Decrease coumadin to 2.5mg  today  Harland German, Pharm D 05/14/2011 4:04 PM

## 2011-05-14 NOTE — Progress Notes (Signed)
Subjective: Patient up in chair this am.  Still with LUE weakness.  Day 1/12 oral prednisone taper.  Objective: BP 90/47  Pulse 42  Temp(Src) 98.1 F (36.7 C) (Oral)  Resp 18  Ht 5\' 9"  (1.753 m)  Wt 67 kg (147 lb 11.3 oz)  BMI 21.81 kg/m2  SpO2 93%  Intake/Output from previous day: 12/01 0701 - 12/02 0700 In: 140 [I.V.:140] Out: 1575 [Urine:1575] Intake/Output this shift:    Medications:  Scheduled:   . aspirin  324 mg Oral QPM  . cholecalciferol  2,000 Units Oral Daily  . doxazosin  4 mg Oral QPM  . finasteride  5 mg Oral QPM  . multivitamins ther. w/minerals  1 tablet Oral Daily  . pantoprazole  40 mg Oral QPM  . phenytoin  300 mg Oral QHS  . prednisoLONE  10 mg Oral QPC breakfast  . prednisoLONE  20 mg Oral QPC breakfast  . predniSONE  30 mg Oral QPC breakfast  . predniSONE  40 mg Oral QPC breakfast  . predniSONE  50 mg Oral QPC breakfast  . predniSONE  60 mg Oral QPC breakfast  . rosuvastatin  20 mg Oral Daily  . warfarin  5 mg Oral ONCE-1800  . warfarin  1 each Does not apply Once  . DISCONTD: prednisoLONE  10 mg Oral QPC breakfast  . DISCONTD: prednisoLONE  20 mg Oral QPC breakfast  . DISCONTD: predniSONE  30 mg Oral QPC breakfast  . DISCONTD: predniSONE  40 mg Oral QPC breakfast   Neurologic Exam: Mental Status: Alert, oriented, thought content appropriate. Speech fluent without evidence of aphasia. Able to follow 3 step commands without difficulty.  Cranial Nerves:  II: visual fields grossly normal, pupils equal, round, reactive to light and accommodation  III,IV, VI: ptosis not present, extraocular muscles extra-ocular motions intact bilaterally  V,VII: smile symmetric, facial light touch sensation normal bilaterally  XI: trapezius strength decreased bilaterally  XII: tongue strength normal  Motor:  Right Upper extremity - 5/5 hand grip with 4+/5 strength proximally. Patient able to lift arm to about 180 degrees without assistance.  Left Upper  extremity - 5/5 hand grip with 4/5 strength proximally. Patient able to lift arm to about 80 degrees without assistance.  Right Lower extremity - 5/5 plantar flexion and extension, 4-/5 proximally with patient able to lift leg easily beyond 70 degrees  Left Lower extremity - 5/5 plantar flexion and extension, 4+/5 proximally with patient able to lift leg easily beyond 70 degrees  Tone and bulk:normal tone throughout; no atrophy noted  Sensory: Light touch intact throughout in the lower extremities, decreased in the fingertips bilaterally  Deep Tendon Reflexes: 1+ biceps   Lab Results:  Basename 05/14/11 0630 05/13/11 0800  WBC 10.3 8.3  HGB 10.9* 11.8*  HCT 32.6* 35.3*  PLT 136* 153  NA -- --  K -- --  CL -- --  CO2 -- --  GLUCOSE -- --  BUN -- --  CREATININE -- --  CALCIUM -- --  LABA1C -- --    Assessment/Plan: 1. Cervical Myelopathy- most consistent with transverse myelitis. On oral pred taper- 1/12.  CIR to re-eval pt 12/3. 2. Urinary retention/incontinence - foley in place.  3. 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 Coumadin loading, on heparin. Will rec D/C aspirin once coumadin is   therapeutic.  4. Aflutter-slow. For op EP eval and poss RFA.   Plan:  1. Continue therapy with CIR re-eval sched for 12/3.  2. Oral prednisone taper.  3. Coumadin per pharmacy.      LOS: 6 days   Marya Fossa PA-C [facility] [phone] 05/14/2011  8:01 AM

## 2011-05-15 ENCOUNTER — Telehealth: Payer: Self-pay | Admitting: Neurology

## 2011-05-15 LAB — CBC
HCT: 35.8 % — ABNORMAL LOW (ref 39.0–52.0)
MCH: 30.1 pg (ref 26.0–34.0)
MCV: 90.6 fL (ref 78.0–100.0)
Platelets: 163 10*3/uL (ref 150–400)
RBC: 3.95 MIL/uL — ABNORMAL LOW (ref 4.22–5.81)

## 2011-05-15 MED ORDER — WARFARIN SODIUM 2.5 MG PO TABS
2.5000 mg | ORAL_TABLET | Freq: Once | ORAL | Status: AC
  Administered 2011-05-15: 2.5 mg via ORAL
  Filled 2011-05-15: qty 1

## 2011-05-15 MED ORDER — POLYETHYLENE GLYCOL 3350 17 G PO PACK
17.0000 g | PACK | Freq: Every day | ORAL | Status: DC
  Administered 2011-05-15 – 2011-05-16 (×2): 17 g via ORAL
  Filled 2011-05-15 (×2): qty 1

## 2011-05-15 NOTE — H&P (Signed)
Physical Medicine and Rehabilitation Admission H&P  Jay Walters is an 77 y.o. male.   No chief complaint on file. : HPI: 77 year old male with history of atrial fibrillation as well as atrial septal defect status post repair. Patient also with a remote history of head trauma with subsequent seizure disorder. Admitted November 1 6 with right leg weakness and stumbling gait since October of 2012. Last night bladder incontinence reported. Symptoms have since gradually worsened since November 22. MRI cervical spine showed progression of R sided C2-C5 T2 hyperintensity within the spinal cord . Placed on intravenous Solu-Medrol for suspect transverse myelitis versus possible spinal cord infarct. MRI of the brain was negative for any acute abnormalities. Followup cardiology services for asymptomatic atrial flutter. Placed on heparin and Coumadin therapy. There was plan for ablation in the near future.  CT angiogram neck showed thrombus and or atherosclerotic plaque carotid bifurcation bilaterally with less than 50% diameter stenosis of the proximal internal carotid artery bilaterally. Physical therapy evaluation on November 30 patient was total assist to ambulate 50 feet with 2 person hand held assistance. Physical medicine rehabilitation was consulted at the request of physical therapy to consider inpatient rehabilitation services  Review of Systems  Constitutional: Positive for malaise/fatigue.  Eyes: Negative for double vision.  Respiratory: Negative for cough.  Cardiovascular: Negative for chest pain.  Gastrointestinal: Positive for constipation. Negative for nausea.  Genitourinary: Positive for urgency.  Poor sensation of voiding with incont  Musculoskeletal: Positive for joint pain.  Neurological: Positive for seizures and weakness. Negative for dizziness and headaches.  Psychiatric/Behavioral: Negative for depression   Past Medical History  Diagnosis Date  . GERD (gastroesophageal reflux  disease)   . Dysphagia     with solids  . Seizures   . Hypertension   . Hiatal hernia   . Esophageal stricture   . Atrial fibrillation   . Diverticulosis   . Osteopenia   . Hemorrhoids    Past Surgical History  Procedure Date  . Asd repair [id number]  . Knee arthroscopy   . Inguinal hernia repair 2011    Right   Family History  Problem Relation Age of Onset  . Heart disease Father    Social History:  reports that he has never smoked. He has never used smokeless tobacco. He reports that he drinks alcohol. He reports that he does not use illicit drugs. Allergies: No Known Allergies Medications Prior to Admission  Medication Dose Route Frequency Provider Last Rate Last Dose  . 0.9 %  sodium chloride infusion  250 mL Intravenous PRN Joycelyn Schmid, MD 20 mL/hr at 05/14/11 2100 250 mL at 05/14/11 2100  . acetaminophen (TYLENOL) tablet 650 mg  650 mg Oral Q6H PRN Joycelyn Schmid, MD   650 mg at 05/09/11 2213  . aspirin chewable tablet 324 mg  324 mg Oral QPM Joycelyn Schmid, MD   324 mg at 05/14/11 1731  . cholecalciferol (VITAMIN D) tablet 2,000 Units  2,000 Units Oral Daily Joycelyn Schmid, MD   2,000 Units at 05/15/11 [phone]  . coumadin book 1 each  1 each Does not apply Once Emeline Gins, PHARMD   1 each at 05/12/11 1741  . doxazosin (CARDURA) tablet 4 mg  4 mg Oral QPM Joycelyn Schmid, MD   4 mg at 05/14/11 1731  . finasteride (PROSCAR) tablet 5 mg  5 mg Oral QPM Joycelyn Schmid, MD   5 mg at 05/14/11 1731  . gadobenate dimeglumine (MULTIHANCE) injection 12 mL  12 mL Intravenous  Once PRN Medication Radiologist   12 mL at 05/08/11 1901  . heparin ADULT infusion 100 units/ml (25000 units/250 ml)  1,250 Units/hr Intravenous Continuous Dickey Gave, PHARMD 12.5 mL/hr at 05/14/11 2100 12.5 mL/hr at 05/14/11 2100  . iohexol (OMNIPAQUE) 300 MG/ML injection 100 mL  100 mL Intravenous Once PRN Medication Radiologist   100 mL at 05/09/11 0959  . iohexol (OMNIPAQUE) 350  MG/ML injection 50 mL  50 mL Intravenous Once PRN Medication Radiologist   50 mL at 05/09/11 1835  . methylPREDNISolone sodium succinate (SOLU-MEDROL) 1,000 mg in sodium chloride 0.9 % 50 mL IVPB  1,000 mg Intravenous QHS Joycelyn Schmid, MD   1,000 mg at 05/13/11 0008  . multivitamins ther. w/minerals tablet 1 tablet  1 tablet Oral Daily Joycelyn Schmid, MD   1 tablet at 05/15/11 0923  . pantoprazole (PROTONIX) EC tablet 40 mg  40 mg Oral QPM Joycelyn Schmid, MD   40 mg at 05/14/11 1731  . phenytoin (DILANTIN) ER capsule 300 mg  300 mg Oral QHS Joycelyn Schmid, MD   300 mg at 05/14/11 2153  . polyethylene glycol (MIRALAX / GLYCOLAX) packet 17 g  17 g Oral Daily Vania Rea. Katrinka Blazing, [place]      . prednisoLONE tablet 10 mg  10 mg Oral QPC breakfast Tara C Jernejcic, PA      . prednisoLONE tablet 20 mg  20 mg Oral QPC breakfast Tara C Jernejcic, PA      . predniSONE (DELTASONE) tablet 30 mg  30 mg Oral QPC breakfast Tara C Jernejcic, PA      . predniSONE (DELTASONE) tablet 40 mg  40 mg Oral QPC breakfast Tara C Jernejcic, PA      . predniSONE (DELTASONE) tablet 50 mg  50 mg Oral QPC breakfast Tara C Jernejcic, PA      . predniSONE (DELTASONE) tablet 60 mg  60 mg Oral QPC breakfast Tara C Jernejcic, PA   60 mg at 05/14/11 1040  . rosuvastatin (CRESTOR) tablet 20 mg  20 mg Oral Daily Joycelyn Schmid, MD   20 mg at 05/15/11 0923  . senna-docusate (Senokot-S) tablet 1 tablet  1 tablet Oral Daily PRN Joycelyn Schmid, MD   1 tablet at 05/10/11 2116  . traMADol (ULTRAM) tablet 100 mg  100 mg Oral Q8H PRN Joycelyn Schmid, MD   100 mg at 05/09/11 1533  . traZODone (DESYREL) tablet 25 mg  25 mg Oral QHS PRN Joycelyn Schmid, MD   25 mg at 05/09/11 2213  . warfarin (COUMADIN) tablet 2.5 mg  2.5 mg Oral ONCE-1800 Benny Lennert, PHARMD   2.5 mg at 05/14/11 1731  . warfarin (COUMADIN) tablet 2.5 mg  2.5 mg Oral ONCE-1800 Dennie Fetters, [facility]      . warfarin (COUMADIN) tablet 5 mg  5 mg Oral ONCE-1800 Emeline Gins, PHARMD   5 mg at 05/12/11 1742  . warfarin (COUMADIN) tablet 5 mg  5 mg Oral ONCE-1800 Christian M [place], PHARMD   5 mg at 05/13/11 1715  . warfarin (COUMADIN) video 1 each  1 each Does not apply Once Emeline Gins, PHARMD      . DISCONTD: 0.9 %  sodium chloride infusion   Intravenous Continuous Joycelyn Schmid, MD      . DISCONTD: aspirin chewable tablet 81 mg  81 mg Oral QPM Joycelyn Schmid, MD      . DISCONTD: digoxin (LANOXIN) tablet 62.5 mcg  62.5 mcg Oral Daily Joycelyn Schmid, MD      .  DISCONTD: Ginseng CAPS 250 mg  1 capsule Oral Daily Joycelyn Schmid, MD      . DISCONTD: prednisoLONE tablet 10 mg  10 mg Oral QPC breakfast Tara C Jernejcic, PA      . DISCONTD: prednisoLONE tablet 20 mg  20 mg Oral QPC breakfast Tara C Jernejcic, PA      . DISCONTD: predniSONE (DELTASONE) tablet 30 mg  30 mg Oral QPC breakfast Tara C Jernejcic, PA      . DISCONTD: predniSONE (DELTASONE) tablet 40 mg  40 mg Oral QPC breakfast Tara C Jernejcic, PA      . DISCONTD: SILYMARIN CAPS 1 capsule  1 capsule Oral Daily Joycelyn Schmid, MD       Medications Prior to Admission  Medication Sig Dispense Refill  . aspirin 81 MG tablet Take 81 mg by mouth every evening.       Marland Kitchen atorvastatin (LIPITOR) 40 MG tablet Take 40 mg by mouth every evening.       . Cholecalciferol (VITAMIN D3) 1000 UNITS CAPS Take 2,000 Units by mouth daily.       Marland Kitchen doxazosin (CARDURA) 4 MG tablet Take 4 mg by mouth every evening.       . finasteride (PROSCAR) 5 MG tablet Take 5 mg by mouth every evening.       . Ginseng 250 MG CAPS Take 1 capsule by mouth daily.       . Milk Thistle-Turmeric (SILYMARIN) CAPS Take 1 capsule by mouth daily.        . naproxen sodium (ANAPROX) 220 MG tablet Take 440 mg by mouth 2 (two) times daily as needed. For pain      . pantoprazole (PROTONIX) 40 MG tablet Take 40 mg by mouth every evening.       . phenytoin (DILANTIN) 100 MG ER capsule Take 300 mg by mouth at  bedtime.         Home: Home Living Lives With: Spouse;Son Type of Home: House Home Layout: Two level;Able to live on main level with bedroom/bathroom Alternate Level Stairs-Rails: Right;Left;Can reach both Alternate Level Stairs-Number of Steps: 13 Home Access: Stairs to enter Entrance Stairs-Rails: Can reach both;Left;Right Entrance Stairs-Number of Steps: 11 Bathroom Shower/Tub: Tub only;Walk-in shower;Curtain;Door [employer] Toilet: Standard Home Adaptive Equipment: Crutches   Functional History: Prior Function Level of Independence: Independent with basic ADLs;Independent with homemaking with ambulation;Independent with gait;Independent with transfers Able to Take Stairs?: Reciprically Driving: Yes Vocation: Part time employment 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 Leisure: Hobbies-yes (Comment) Comments: golf  Functional Status:  Mobility: Bed Mobility Bed Mobility: Yes Supine to Sit: 5: Supervision;HOB flat;With rails Supine to Sit Details (indicate cue type and reason): Verbal cues for safest sequence. Transfers Transfers: Yes Sit to Stand: 1: +2 Total assist;Patient percentage (comment);From bed (+2 total assist (pt=70%)) Sit to Stand Details (indicate cue type and reason): Assist for balance with translation of trunk over BOS.  Blocked bilateral knees to prevent buckling. Stand to Sit: 1: +2 Total assist;Patient percentage (comment);To chair/3-in-1 (+2 total assist (pt=70%)) Stand to Sit Details: Assist for trunk to slow descent/eccentric control of quadriceps.  Cues for safest hand placement with mobility. Ambulation/Gait Ambulation/Gait: Yes Ambulation/Gait Assistance: 1: +2 Total assist;Patient percentage (comment) (+2 total assist (pt=70%)) Ambulation/Gait Assistance Details (indicate cue type and reason): Assist for balance with cues to extend at hips and trunk.  Pt with tendency to ambulate in  squated stance due to proximal weakness.  Chair follow for safety. Ambulation Distance (Feet): 50 Feet Assistive device: 2 person hand held assist Gait Pattern: Decreased step length - right;Decreased step length - left;Right foot flat;Left foot flat;Right flexed knee in stance;Left flexed knee in stance;Shuffle;Ataxic;Trunk flexed Gait velocity: 0.54 feet/sec (Indicates increased risk for falls.) Stairs: No Wheelchair Mobility Wheelchair Mobility: No  ADL: ADL Eating/Feeding: Minimal assistance;Simulated Eating/Feeding Details (indicate cue type and reason): Patient with greater difficulty getting left hand to mouth compared to right  Where Assessed - Eating/Feeding: Edge of bed Grooming: Simulated;Moderate assistance Where Assessed - Grooming:  (standing at EOB) Upper Body Bathing: Minimal assistance;Simulated Where Assessed - Upper Body Bathing: Sitting, bed Lower Body Bathing: Minimal assistance;Simulated Where Assessed - Lower Body Bathing: Sit to stand from chair Upper Body Dressing: Performed;Moderate assistance Where Assessed - Upper Body Dressing: Sitting, bed Lower Body Dressing: Performed;Set up Lower Body Dressing Details (indicate cue type and reason): don socks Where Assessed - Lower Body Dressing: Sitting, bed Toilet Transfer: Simulated;+2 Total assistance Toilet Transfer Details (indicate cue type and reason): pt=70% with ambulation with bilateral hand held assist. bilateral knees ocassionally buckling- pt able to self correct as long as patient weight bearing through UEs Toilet Transfer Method: Ambulating  Cognition: Cognition Arousal/Alertness: Awake/alert Orientation Level: Oriented X4 Cognition Arousal/Alertness: Awake/alert Overall Cognitive Status: Appears within functional limits for tasks assessed Orientation Level: Oriented X4   Blood pressure 109/61, pulse 43, temperature 98.5 F (36.9 C), temperature source Oral, resp. rate 16, height 5\' 9"  (1.753  m), weight 67 kg (147 lb 11.3 oz), SpO2 95.00%. Physical Exam  Constitutional: He is oriented to person, place, and time. He appears well-developed.  HENT:  Head: Normocephalic.  Neck: Neck supple. No thyromegaly present.  Cardiovascular:  Irregular irregular  Pulmonary/Chest: No respiratory distress.  Abdominal: He exhibits no distension. There is no tenderness.  Musculoskeletal: He exhibits no edema.  Lymphadenopathy:  He has no cervical adenopathy.  Neurological: He is alert and oriented to person, place, and time.  Skin: Skin is warm and dry.  Psychiatric: Thought content normal.   Manual muscle testing reveals one out of 5 left deltoid 2+ out of 5 right deltoid, 3+ to 4/5 right biceps, 3+/5 left biceps, 4/5 right triceps 3+ to 4/5 left triceps ,4/5 grip. In the lower extremities patient has 2 plus in the hip flexors 4 minus in the hip extensors quadriceps and ankle dorsiflexors  Sensory exam reveals diminished pinprick in both hands from the wrist distally. He also has mild sensory loss along the soles of the feet left more than right.  Deep tendon reflexes are normal bilateral upper and lower extremities.  There is no evidence of muscle fasciculations   Results for orders placed during the hospital encounter of 05/08/11 (from the past 48 hour(s))  HEPARIN LEVEL (UNFRACTIONATED)     Status: Normal   Collection Time   05/14/11  6:30 AM      Component Value Range Comment   Heparin Unfractionated 0.65  0.30 - 0.70 (IU/mL)   CBC     Status: Abnormal   Collection Time   05/14/11  6:30 AM      Component Value Range Comment   WBC 10.3  4.0 - 10.5 (K/uL)    RBC 3.58 (*) 4.22 - 5.81 (MIL/uL)    Hemoglobin 10.9 (*) 13.0 - 17.0 (g/dL)    HCT 46.9 (*) 62.9 - 52.0 (%)    MCV 91.1  78.0 - 100.0 (fL)    MCH 30.4  26.0 - 34.0 (  pg)    MCHC 33.4  30.0 - 36.0 (g/dL)    RDW 16.1  09.6 - 04.5 (%)    Platelets 136 (*) 150 - 400 (K/uL)   PROTIME-INR     Status: Abnormal   Collection Time    05/14/11  6:30 AM      Component Value Range Comment   Prothrombin Time 20.8 (*) 11.6 - 15.2 (seconds)    INR 1.76 (*) 0.00 - 1.49    HEPARIN LEVEL (UNFRACTIONATED)     Status: Normal   Collection Time   05/15/11  6:00 AM      Component Value Range Comment   Heparin Unfractionated 0.49  0.30 - 0.70 (IU/mL)   CBC     Status: Abnormal   Collection Time   05/15/11  6:00 AM      Component Value Range Comment   WBC 7.8  4.0 - 10.5 (K/uL)    RBC 3.95 (*) 4.22 - 5.81 (MIL/uL)    Hemoglobin 11.9 (*) 13.0 - 17.0 (g/dL)    HCT 40.9 (*) 81.1 - 52.0 (%)    MCV 90.6  78.0 - 100.0 (fL)    MCH 30.1  26.0 - 34.0 (pg)    MCHC 33.2  30.0 - 36.0 (g/dL)    RDW 91.4  78.2 - 95.6 (%)    Platelets 163  150 - 400 (K/uL)   PROTIME-INR     Status: Abnormal   Collection Time   05/15/11  6:00 AM      Component Value Range Comment   Prothrombin Time 22.7 (*) 11.6 - 15.2 (seconds)    INR 1.96 (*) 0.00 - 1.49     No results found.  Post Admission Physician Evaluation: 1. Functional deficits secondary to C2-C5 cervical myelopathy and suspected transverse myelitis 2. Patient is admitted to receive collaborative, interdisciplinary care between the physiatrist, rehab nursing staff, and therapy team. 3. Patient's level of medical complexity and substantial therapy needs in context of that medical necessity cannot be provided at a lesser intensity of care such as a SNF. 4. Patient has experienced substantial functional loss from his/her baseline which was documented above under the "Functional History" and "Functional Status" headings.  Judging by the patient's diagnosis, physical exam, and functional history, the patient has potential for functional progress which will result in measurable gains while on inpatient rehab.  These gains will be of substantial and practical use upon discharge  in facilitating mobility and self-care at the household level. 5. Physiatrist will provide 24 hour management of medical needs as  well as oversight of the therapy plan/treatment and provide guidance as appropriate regarding the interaction of the two. 6. 24 hour rehab nursing will assist with bladder management, bowel management, safety, skin/wound care, medication administration, pain management and patient education  and help integrate therapy concepts, techniques,education, etc. 7. PT will assess and treat for: balance, endurance, strength and transferring. Goals are: independent with assistive device to supervision. 8. OT will assess and treat for: bathing, dressing, endurance, strength, toileting and transferring .  Goals are: supervision to minimal assist 9. SLP will assess and treat for:  Goals are: N/A. 10. Case Management and Social Worker will assess and treat for psychological issues and discharge planning. 11. Team conference will be held weekly to assess progress toward goals and to determine barriers to discharge. 12.  Patient will receive at least 3 hours of therapy per day at least 5 days per week. 13. ELOS and Prognosis: 10-14 days excellent  Medical Problem List and Plan: 1.Cervical Myelopathy-IV solumedrol completed.Prednisone taper per neurology recommendations  2. DVT Prophylaxis/Anticoagulation: Atrial flutter with history atrial septal defect with repair.IV heparin coumadin per pharm. Monitor for any bleeding episodes/ check platelets. Plan ablation in the near future as per cardiology  3.Neurogenic bowel and bladder-Remove foley and begin bladder training.Check PVR x 3. Continue proscar and cardura for now.Regulate bowel program with full education..  4.. Pain Management: ultram. Monitor with increased activity especially in upper extremities.  5..History seizure disorder-Dilantin 300mg  daily.Monitor for seizure activity No seizure activity for many years. Check levels as appropriate  6.Hyperlipidemia-crestor  7.GERD-protonix       05/15/2011, 11:39 AM

## 2011-05-15 NOTE — Progress Notes (Signed)
I met with patient and discussed CIR/ inpatient acute rehabilitation venue and goals. I await updated therapy progress today to determine if patient will need to be admitted to CIR before discharge home with his wife. I also contacted his wife to answer any questions she may have had. I will follow up this afternoon. Please call with any questions. Pager [phone].

## 2011-05-15 NOTE — Progress Notes (Signed)
Pt. Seen and examined. Agree with the NP/PA-C note as written.  He is in atrial flutter with slow ventricular response. He is currently asymptomatic. I discussed options with him, including TEE cardioversion .Marland Kitchen Or TEE and subsequent ablation, possibly during this hospitalization and ideally before being discharged. I now understand he is going to inpatient rehab. At this point, I would favor having him recover from his myelitis, rehab, continue coumadin and undergo an outpatient TEE/cardioversion. He could then follow-up with Dr. Johney Frame for ablation if his flutter re-occurs.  Chrystie Nose, MD Attending Cardiologist [facility]

## 2011-05-15 NOTE — Progress Notes (Signed)
Physical Therapy Treatment Patient Details Name: Jay Walters MRN: [id number] DOB: 25-Sep-1931 Today's Date: 05/15/2011  PT Assessment/Plan  PT - Assessment/Plan Comments on Treatment Session: The patient is progressing well and is highly motivated to get better.  He was very active PTA and enjoyed playing golf  and walking outside.  He fatigues quickly with gait and needs physical assist to keep from falling especially when turning around.  Because of his weakness in his legs he has a flexed knee hip and trunk posture during gait and since his arms are also weak he has a hard time compensating for his legs by using his arms more.  This was a co-session with OT.  See OT notes for more details.   PT Plan: Discharge plan remains appropriate PT Frequency: Min 3X/week Follow Up Recommendations: Inpatient Rehab Equipment Recommended: Defer to next venue PT Goals  Acute Rehab PT Goals Pt will go Supine/Side to Sit: Independently;with HOB 0 degrees PT Goal: Supine/Side to Sit - Progress: Revised (modified due to lack of progress/goal met) Pt will go Sit to Stand: with modified independence PT Goal: Sit to Stand - Progress: Revised due to lack of progress Pt will go Stand to Sit: with supervision PT Goal: Stand to Sit - Progress: Progressing toward goal PT Goal: Ambulate - Progress: Progressing toward goal  PT Treatment Precautions/Restrictions  Precautions Precautions: Fall Required Braces or Orthoses: No Restrictions Weight Bearing Restrictions: No Mobility (including Balance) Bed Mobility Supine to Sit: 6: Modified independent (Device/Increase time);HOB flat;With rails Supine to Sit Details (indicate cue type and reason): patient relying on rails to pull up to sitting.   Transfers Transfers: Yes Sit to Stand: 5: Supervision;From elevated surface;With upper extremity assist Sit to Stand Details (indicate cue type and reason): supervision for safety secondary to risk of bucking legs.   Verbal cues for safe hand placment secondary to patient wanted to initially pull up with both hands on RW.   Stand to Sit: 4: Min assist;With upper extremity assist;With armrests;To chair/3-in-1 Stand to Sit Details: min assist to help control descent to sit in low recliner.  Verbal cues to reach back to help lower and make sure he could feet the chair before he sat down.   Ambulation/Gait Ambulation/Gait Assistance: 3: Mod assist Ambulation/Gait Assistance Details (indicate cue type and reason): mod assist with RW with chair to follow for safety and to encourage increased gait distance.  Patient neede nearly constant cues for upright posture.  Flexed knees, hips and trunk.  Assist started as min assist, but increased to mod as patient fatigued and during turns.   Ambulation Distance (Feet): 80 Feet Assistive device: Rolling walker Gait Pattern: Decreased stride length;Shuffle;Trunk flexed    Exercise  General Exercises - Upper Extremity Shoulder Flexion: AROM;Both;10 reps (to 90 degrees, scapular elevation compesatory pattern.) Elbow Flexion: AROM;Both;10 reps;Seated General Exercises - Lower Extremity Long Arc Quad: AROM;Both;10 reps;Seated (right leg weaker than left despite left arm being weaker) Hip ABduction/ADduction: AROM;Both;10 reps;Seated Hip Flexion/Marching: AROM;Both;10 reps;Seated (again, R leg weaker than L with this exercise.  ) Toe Raises: AROM;Both;10 reps;Seated Heel Raises: AROM;Both;10 reps;Seated End of Session PT - End of Session Equipment Utilized During Treatment: Gait belt (RW) Activity Tolerance: Patient limited by fatigue Patient left: in chair;with call bell in reach General Behavior During Session: Jay Walters for tasks performed Cognition: Jay Walters for tasks performed  Jay Walters B. Jay Walters, PT, DPT [phone]   05/15/2011, 3:03 PM

## 2011-05-15 NOTE — Progress Notes (Signed)
I have reviewed updated therapy progress notes. I have begun insurance approval and await their decision to admit. Anticipate admit tomorrow. Please call with questions pager [phone].

## 2011-05-15 NOTE — Progress Notes (Signed)
Subjective:  Still has left upper extremities weakness.  States he is passing gas but no significant BM since last week. Day 2 of prednisone taper.  Objective: Vital signs in last 24 hours: Temp:  [98.2 F (36.8 C)-99 F (37.2 C)] 98.5 F (36.9 C) (12/03 0600) Pulse Rate:  [43-58] 43  (12/03 0600) Resp:  [16-20] 16  (12/03 0600) BP: (97-109)/(36-61) 109/61 mmHg (12/03 0600) SpO2:  [95 %-98 %] 95 % (12/03 0600)  Intake/Output from previous day: 12/02 0701 - 12/03 0700 In: 670 [P.O.:360; I.V.:310] Out: 1725 [Urine:1725] Intake/Output this shift:   Nutritional status: Cardiac  Past Medical History  Diagnosis Date  . GERD (gastroesophageal reflux disease)   . Dysphagia     with solids  . Seizures   . Hypertension   . Hiatal hernia   . Esophageal stricture   . Atrial fibrillation   . Diverticulosis   . Osteopenia   . Hemorrhoids     Neurologic Exam: Mental Status: Alert, oriented, thought content appropriate.  Speech fluent without evidence of aphasia. Able to follow 3 step commands without difficulty. Cranial Nerves: II-Visual fields grossly intact. III/IV/VI-Extraocular movements intact.  Pupils reactive bilaterally. V/VII-Smile symmetric VIII-grossly intact IX/X-normal gag XI-bilateral shoulder shrug XII-midline tongue extension Motor: patient is unable to hold right upper extremity antigravity and need to use his left hand to help lift. With just his left shoulder he can can lift it to about 90 degrees.  She shows 3/ 5 strength with shoulder strength and 4/ 4 with bicep/tricep flexion.  Grips are equal.  Right arm shows 5/5.  Bilateral lower extremities shows 4/5 strength.  Able to lift both legs of bed without problem and hold them antigravity.  .  Normal tone and bulk.  bilateral mute toes.   Sensory: Pinprick and light touch intact throughout, bilaterally Deep Tendon Reflexes: 2+ and symmetric throughout UE, 1+ in left patella and minimal in right patella and  achilles.   Plantars: Mute bilaterally Cerebellar: Normal finger-to-nose, normal rapid alternating movements and normal heel-to-shin test.  Normal gait and station.    Lab Results:  Basename 05/15/11 0600 05/14/11 0630  WBC 7.8 10.3  HGB 11.9* 10.9*  HCT 35.8* 32.6*  PLT 163 136*  NA -- --  K -- --  CL -- --  CO2 -- --  GLUCOSE -- --  BUN -- --  CREATININE -- --  CALCIUM -- --  LABA1C -- --   Lipid Panel No results found for this basename: CHOL,TRIG,HDL,CHOLHDL,VLDL,LDLCALC in the last 72 hours  Studies/Results: No results found.  Medications:  Scheduled:   . aspirin  324 mg Oral QPM  . cholecalciferol  2,000 Units Oral Daily  . doxazosin  4 mg Oral QPM  . finasteride  5 mg Oral QPM  . multivitamins ther. w/minerals  1 tablet Oral Daily  . pantoprazole  40 mg Oral QPM  . phenytoin  300 mg Oral QHS  . polyethylene glycol  17 g Oral Daily  . prednisoLONE  10 mg Oral QPC breakfast  . prednisoLONE  20 mg Oral QPC breakfast  . predniSONE  30 mg Oral QPC breakfast  . predniSONE  40 mg Oral QPC breakfast  . predniSONE  50 mg Oral QPC breakfast  . predniSONE  60 mg Oral QPC breakfast  . rosuvastatin  20 mg Oral Daily  . warfarin  2.5 mg Oral ONCE-1800  . warfarin  1 each Does not apply Once    Assessment/Plan:  Cervical Myelopathy  Assessment: currently on prednisone taper, CIR to accept patient once insurance has been accepted.  They will contact us once this occurs.   Plan: Awaiting Rehab disposition Constipation   Assessment  Currently on dulcolax.    Plan: 1. Miralax started            2. If no BM by tomorrow will consider Fleets A-Flutter   Assessment: stable   Plan: Pharmacy following anticoagulation    Felicie Morn PA-C [facility] [phone]  05/15/2011, 9:12 AM

## 2011-05-15 NOTE — Plan of Care (Signed)
Problem: Phase III Progression Outcomes Goal: Discharge plan remains appropriate-arrangements made Outcome: Progressing PT/OT continue to recommend inpatient rehab prior to returning home.

## 2011-05-15 NOTE — Progress Notes (Signed)
CSW received consult for "possible SNF." In the event that CIR is unable to admit pt, SNF is an alternative. For full assessment, please see pt's chart. CSW will continue to follow and assess discharge needs during this admission.  Dede Query, MSW, Theresia Majors [phone]

## 2011-05-15 NOTE — Progress Notes (Signed)
ANTICOAGULATION CONSULT NOTE - Follow Up Consult  Pharmacy Consult for  Heparin and Coumadin Indication: atrial flutter  No Known Allergies  Patient Measurements: Height: 5\' 9"  (175.3 cm) Weight: 147 lb 11.3 oz (67 kg) IBW/kg (Calculated) : 70.7  Dosing weight:  67 kg  Vital Signs: Temp: 98.5 F (36.9 C) (12/03 0600) BP: 109/61 mmHg (12/03 0600) Pulse Rate: 43  (12/03 0600)  Labs:  Basename 05/15/11 0600 05/14/11 0630 05/13/11 0800  HGB 11.9* 10.9* --  HCT 35.8* 32.6* 35.3*  PLT 163 136* 153  APTT -- -- --  LABPROT 22.7* 20.8* 16.4*  INR 1.96* 1.76* 1.30  HEPARINUNFRC 0.49 0.65 0.31  CREATININE -- -- --  CKTOTAL -- -- --  CKMB -- -- --  TROPONINI -- -- --   Estimated Creatinine Clearance: 73.3 ml/min (by C-G formula based on Cr of 0.74).  Assessment:    Heparin level 0.49 today is therapeutic on 1250 units/hr.    INR 1.96, almost at goal.  Aspirin to stop when INR at goal.    CBC okay today. PLTC 163K  Goal of Therapy:  Heparin level 0.3-0.7 units/ml  INR 2-3  Plan:    Continue Heparin drip at 1250 units/hr.   Repeat Coumadin 2.5 mg today.   Next labs in AM.  Dennie Fetters Pager:  [phone] 05/15/2011,10:05 AM

## 2011-05-15 NOTE — Progress Notes (Signed)
Occupational Therapy Treatment Patient Details Name: Khanh Tanori MRN: [id number] DOB: 04-23-1932 Today's Date: 05/15/2011 14:05-14:25 Co-session with PT Lurena Joiner)  OT Assessment/Plan OT Assessment/Plan Comments on Treatment Session: Pt making progress since eval. Will greatly benefit from continued OT at inpatient rehab level to get to an I/Mod I level. OT Plan: Discharge plan remains appropriate OT Frequency: Min 2X/week Follow Up Recommendations: Inpatient Rehab Equipment Recommended: Defer to next venue OT Goals ADL Goals ADL Goal: Eating - Progress: Met ADL Goal: Grooming - Progress: Not addressed ADL Goal: Upper Body Dressing - Progress: Not addressed ADL Goal: Lower Body Dressing - Progress: Progressing toward goals ADL Goal: Toilet Transfer - Progress: Progressing toward goals  OT Treatment Precautions/Restrictions  Precautions Precautions: Fall Required Braces or Orthoses: No Restrictions Weight Bearing Restrictions: No   ADL ADL Eating/Feeding Details (indicate cue type and reason): Pt reported that he fed himself without issue. Grooming: Not assessed Upper Body Bathing: Not assessed Lower Body Bathing: Not assessed Upper Body Dressing: Not assessed Lower Body Dressing: Performed;Supervision/safety;Set up Lower Body Dressing Details (indicate cue type and reason): To doff/don socks--however for sit to stand pt is min A with Mod A to maintain without UE support Where Assessed - Lower Body Dressing: Sitting, bed Toilet Transfer: Simulated;Moderate assistance Toilet Transfer Details (indicate cue type and reason): Bed out into hallway back to recliner, with definite mod A need for turning and min constant verbal cues to stand up tall and look ahead v. at his feet Toileting - Clothing Manipulation: Not assessed Where Assessed - Toileting Clothing Manipulation: Not assessed Toileting - Hygiene: Not assessed Where Assessed - Toileting Hygiene: Not assessed Tub/Shower  Transfer: Not assessed Tub/Shower Transfer Method: Not assessed Equipment Used: Rolling walker Mobility  Bed Mobility Supine to Sit: 6: Modified independent (Device/Increase time);HOB flat;With rails Supine to Sit Details (indicate cue type and reason): patient relying on rails to pull up to sitting.   Transfers Sit to Stand: 5: Supervision;From elevated surface;With upper extremity assist Sit to Stand Details (indicate cue type and reason): supervision for safety secondary to risk of bucking legs.  Verbal cues for safe hand placment secondary to patient wanted to initially pull up with both hands on RW.   Stand to Sit: 4: Min assist;With upper extremity assist;With armrests;To chair/3-in-1 Stand to Sit Details: min assist to help control descent to sit in low recliner.  Verbal cues to reach back to help lower and make sure he could feet the chair before he sat down.   Exercises General Exercises - Upper Extremity Shoulder Flexion: AROM;Strengthening;Both;Other reps (comment);Other (comment);Supine (3 reps, towel roll used as "bar" to assist with LUE weaker) Other Exercises: In general LUE is weaker than RUE proximally. In sitting pt can get to approximately 50 degrees of flexion LUE and 60 degrees for RUE. Both scapulas with winging RUE greater than LUE (pt not sure if this was case pta. Pt also with scoliosis. Encouraged pt to use towel roll for shoulder flexion exercises when supine in bed and work on alternating RUE/LUE shoulder flexion while seated but only as high as shoulder height if he can get there.  End of Session OT - End of Session Equipment Utilized During Treatment: Gait belt;Other (comment) (RW) Activity Tolerance: Patient tolerated treatment well Patient left: in chair;with call bell in reach Nurse Communication: Mobility status for transfers General Behavior During Session: [facility] for tasks performed Cognition: [facility] for tasks performed  Evette Georges  [phone] 05/15/2011, 3:40 PM

## 2011-05-15 NOTE — PMR Pre-admission (Signed)
PMR Admission Coordinator Pre-Admission Assessment  Patient:  Jay Walters is an 77 y.o., male MRN:  [id number] DOB:  [birth year] Height:  Height: 5\' 9"  (175.3 cm) Weight:  Weight: 67 kg (147 lb 11.3 oz)  Insurance Information: HMO:   PPO:     PCP:     IPA:     80/20:     OTHER:select epo, in network benefits only PRIMARY:[employer]      Policy#:[id number]      Subscriber:spouse CM Name:Shanon      Phone#:[phone]     Fax#: Pre-Cert#:[phone]     Employer:group [id number] Benefits:  Phone #:[phone]     Name:Pam 05/15/2011 Eff. Date:06/13/1987 active     Deduct:none      Out of Pocket Max:$1500      Life XBM:WUXL CIR:85%      SNF:85% Outpatient:$20 per visit. 60 visits combined      Home Health:100% 60 visits combined       DME:80%     Co-Pay:20% Providers:in network only  SECONDARY:[employer] a and b      Policy#:[id number]      Subscriber:pt Benefits:  Phone #:[employer] 05/15/2011     Name:automated Eff. Date:a 4/1/200 and b 08/11/2003     No last billing date. Pt and wife unaware he had medicare       Current Medical History:   Patient Admitting Diagnosis cervical myelitis with quadriparesis and neurogenic bowel and bladder:   History of Present Illness: admitted 04/18/11 with right leg weakness and stumbling gait since 03/2011. MRI showed progression of right sided C2-5 T2 hyperintensity with the spinal cord. Placed in IV solumedrol for suspected transverse myelitis versus possible spinal cord infarct.    Patients Past Medical History:   Past Medical History  Diagnosis Date  . GERD (gastroesophageal reflux disease)   . Dysphagia     with solids  . Seizures   . Hypertension   . Hiatal hernia   . Esophageal stricture   . Atrial fibrillation   . Diverticulosis   . Osteopenia   . Hemorrhoids    Family Medical History:  family history includes Heart disease in his father.      Prior Rehab/Hospitalizations: none  Medications  Current Medications: Current  facility-administered medications:0.9 %  sodium chloride infusion, 250 mL, Intravenous, PRN, Joycelyn Schmid, MD, Last Rate: 20 mL/hr at 05/15/11 2201, 1,000 mL at 05/15/11 2201;  acetaminophen (TYLENOL) tablet 650 mg, 650 mg, Oral, Q6H PRN, Joycelyn Schmid, MD, 650 mg at 05/09/11 2213;  aspirin chewable tablet 324 mg, 324 mg, Oral, QPM, Joycelyn Schmid, MD, 324 mg at 05/15/11 1616 cholecalciferol (VITAMIN D) tablet 2,000 Units, 2,000 Units, Oral, Daily, Joycelyn Schmid, MD, 2,000 Units at 05/15/11 0924;  doxazosin (CARDURA) tablet 4 mg, 4 mg, Oral, QPM, Joycelyn Schmid, MD, 4 mg at 05/15/11 1615;  finasteride (PROSCAR) tablet 5 mg, 5 mg, Oral, QPM, Vikram Penumalli, MD, 5 mg at 05/15/11 1616 heparin ADULT infusion 100 units/ml (25000 units/250 ml), 1,250 Units/hr, Intravenous, Continuous, Christian M Buettner, PHARMD, Last Rate: 12.5 mL/hr at 05/15/11 1323, 1,250 Units/hr at 05/15/11 1323;  multivitamins ther. w/minerals tablet 1 tablet, 1 tablet, Oral, Daily, Joycelyn Schmid, MD, 1 tablet at 05/15/11 0923;  pantoprazole (PROTONIX) EC tablet 40 mg, 40 mg, Oral, QPM, Joycelyn Schmid, MD, 40 mg at 05/15/11 1617 phenytoin (DILANTIN) ER capsule 300 mg, 300 mg, Oral, QHS, Vikram Penumalli, MD, 300 mg at 05/15/11 2135;  polyethylene glycol (MIRALAX / GLYCOLAX) packet 17 g, 17 g, Oral, Daily, Vania Rea.  Smith, PA, 17 g at 05/15/11 1146;  prednisoLONE tablet 10 mg, 10 mg, Oral, QPC breakfast, Tara C Jernejcic, PA;  prednisoLONE tablet 20 mg, 20 mg, Oral, QPC breakfast, Tara C Jernejcic, PA predniSONE (DELTASONE) tablet 30 mg, 30 mg, Oral, QPC breakfast, Tara C Jernejcic, PA;  predniSONE (DELTASONE) tablet 40 mg, 40 mg, Oral, QPC breakfast, Tara C Jernejcic, PA;  predniSONE (DELTASONE) tablet 50 mg, 50 mg, Oral, QPC breakfast, Tara C Jernejcic, PA;  predniSONE (DELTASONE) tablet 60 mg, 60 mg, Oral, QPC breakfast, Tara C Jernejcic, PA, 60 mg at 05/15/11 1145 rosuvastatin (CRESTOR) tablet 20 mg, 20 mg, Oral, Daily,  Joycelyn Schmid, MD, 20 mg at 05/15/11 4098;  senna-docusate (Senokot-S) tablet 1 tablet, 1 tablet, Oral, Daily PRN, Joycelyn Schmid, MD, 1 tablet at 05/10/11 2116;  traMADol (ULTRAM) tablet 100 mg, 100 mg, Oral, Q8H PRN, Joycelyn Schmid, MD, 100 mg at 05/09/11 1533;  traZODone (DESYREL) tablet 25 mg, 25 mg, Oral, QHS PRN, Joycelyn Schmid, MD, 25 mg at 05/09/11 2213 warfarin (COUMADIN) tablet 2.5 mg, 2.5 mg, Oral, ONCE-1800, Dennie Fetters, RPH, 2.5 mg at 05/15/11 1711;  warfarin (COUMADIN) video 1 each, 1 each, Does not apply, Once, Emeline Gins, [facility]  Precautions/Special Needs:    Additional Precautions/Restrictions: Precautions Precautions: Fall Required Braces or Orthoses: No Restrictions Weight Bearing Restrictions: No  Therapy Assessments Cognition Arousal/Alertness: Awake/alert Overall Cognitive Status: Appears within functional limits for tasks assessed Orientation Level: Oriented X4   Home Living Lives With: Spouse;Son Type of Home: House Home Layout: Two level;Able to live on main level with bedroom/bathroom Alternate Level Stairs-Rails: Right;Left;Can reach both Alternate Level Stairs-Number of Steps: 13 Home Access: Stairs to enter Entrance Stairs-Rails: Can reach both;Left;Right Entrance Stairs-Number of Steps: 11 Bathroom Shower/Tub: Tub only;Walk-in shower;Curtain;Door [employer] Toilet: Standard Home Adaptive Equipment: Crutches   Sensation Light Touch: Impaired Detail Light Touch Impaired Details: Impaired LLE (Tingling from ankle down to toes.) Stereognosis: Not tested Hot/Cold: Not tested Proprioception: Not tested Additional Comments: pt report "burning/tingling" sensation starting in bilateral shoulders and progressing distally to ~knuckles on bilateral hands Cognition Arousal/Alertness: Awake/alert Overall Cognitive Status: Appears within functional limits for tasks assessed Orientation Level: Oriented X4 Coordination Gross  Motor Movements are Fluid and Coordinated: No Fine Motor Movements are Fluid and Coordinated: Not tested Coordination and Movement Description: Some ataxia due to proximal weakness.  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 Leisure: Hobbies-yes (Comment) Comments: golf  ADLs/Mobility:Current Functional Level: ADL Eating/Feeding: Minimal assistance;Simulated Eating/Feeding Details (indicate cue type and reason): Pt reported that he fed himself without issue. Where Assessed - Eating/Feeding: Edge of bed Grooming: Not assessed Where Assessed - Grooming:  (standing at EOB) Upper Body Bathing: Not assessed Where Assessed - Upper Body Bathing: Sitting, bed Lower Body Bathing: Not assessed Where Assessed - Lower Body Bathing: Sit to stand from chair Upper Body Dressing: Not assessed Where Assessed - Upper Body Dressing: Sitting, bed Lower Body Dressing: Performed;Supervision/safety;Set up Lower Body Dressing Details (indicate cue type and reason): To doff/don socks--however for sit to stand pt is min A with Mod A to maintain without UE support Where Assessed - Lower Body Dressing: Sitting, bed Toilet Transfer: Simulated;Moderate assistance Toilet Transfer Details (indicate cue type and reason): Bed out into hallway back to recliner, with definite mod  A need for turning and min constant verbal cues to stand up tall and look ahead v. at his feet Toilet Transfer Method: Ambulating Toileting - Clothing Manipulation: Not assessed Where Assessed - Toileting Clothing Manipulation: Not assessed Toileting - Hygiene: Not assessed Where Assessed - Toileting Hygiene: Not  assessed Tub/Shower Transfer: Not assessed Tub/Shower Transfer Method: Not assessed Equipment Used: Rolling walker  Bed Mobility Bed Mobility: Yes Supine to Sit: 6: Modified independent (Device/Increase time);HOB flat;With rails Supine to Sit Details (indicate cue type and reason): patient relying on rails to pull up to sitting.   Transfers Transfers: Yes Sit to Stand: 5: Supervision;From elevated surface;With upper extremity assist Sit to Stand Details (indicate cue type and reason): supervision for safety secondary to risk of bucking legs.  Verbal cues for safe hand placment secondary to patient wanted to initially pull up with both hands on RW.   Stand to Sit: 4: Min assist;With upper extremity assist;With armrests;To chair/3-in-1 Stand to Sit Details: min assist to help control descent to sit in low recliner.  Verbal cues to reach back to help lower and make sure he could feet the chair before he sat down.   Ambulation/Gait Ambulation/Gait: Yes Ambulation/Gait Assistance: 3: Mod assist Ambulation/Gait Assistance Details (indicate cue type and reason): mod assist with RW with chair to follow for safety and to encourage increased gait distance.  Patient neede nearly constant cues for upright posture.  Flexed knees, hips and trunk.  Assist started as min assist, but increased to mod as patient fatigued and during turns.   Ambulation Distance (Feet): 80 Feet Assistive device: Rolling walker Gait Pattern: Decreased stride length;Shuffle;Trunk flexed Gait velocity: 0.54 feet/sec (Indicates increased risk for falls.) Stairs: No Wheelchair Mobility Wheelchair Mobility: No Posture/Postural Control Posture/Postural Control: No significant limitations Balance Balance Assessed: Yes Static Standing Balance Static Standing - Balance Support: Bilateral upper extremity supported (Progressing to no upper extremity support.) Static Standing - Level of Assistance: 1: +2 Total assist;Patient  percentage (comment) (+2 total assist (pt=70%)) Static Standing - Comment/# of Minutes: 2 Dynamic Standing Balance Dynamic Standing - Balance Support: Bilateral upper extremity supported (Progressed to no upper extremity support.) Dynamic Standing - Level of Assistance: 1: +2 Total assist;Patient percentage (comment) (+2 total assist (pt=70%)) Dynamic Standing - Balance Activities: Reaching for objects;Reaching across midline  Home Assistive Devices/Equipment:  Home Assistive Devices/Equipment Home Assistive Devices/Equipment: None  Discharge Planning:  Discharge Planning Living Arrangements: Spouse/significant other;Children Support Systems: Spouse/significant other;Children Do you have any problems obtaining your medications?: No Type of Residence: Private residence Home Care Services: No Patient expects to be discharged to:: home Case Management Consult Needed: No  Prior Functional Levels:  Prior Functional Level Bed Mobility:  (independent with all activities PTA)  Previous Home Environment:  Previous Home Environment Living Arrangements: Spouse/significant other;Children Support Systems: Spouse/significant other;Children Do you have any problems obtaining your medications?: No Type of Residence: Private residence Home Care Services: No Patient expects to be discharged to:: home  Discharge Living Setting:  Discharge Living Setting Plans for Discharge Living Setting: Patient's home Discharge Living Setting is Bedroom on Main Floor?: Yes Discharge Living Setting is Bathroom on Main Floor?: Yes  Social/Family/Support Systems:  Social/Family/Support Systems Patient Roles: Spouse;Other (Comment) (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 and O.T.) Pt/Family Agrees to Admission and willing to participate: Yes Program Orientation Provided & Reviewed with Pt/Caregiver Including Roles  & Responsibilities: Yes  Preadmission Screen Completed By:  Clois Dupes, 05/16/2011 8:20 AM  Patient's condition:  Please see physician update to information in consult dated 05/12/2011.  Preadmission Screen Competed by:Sallye Ober, RN, Time/Date,0823 on 05/16/2011.  Discussed status with Dr. Riley Kill on 05/16/2011 at [phone] and received telephone approval for admission today.  Admission Coordinator:  Clois Dupes, time 9604 Date 05/16/2011.  Marland Kitchen

## 2011-05-15 NOTE — Progress Notes (Signed)
Patient ID: Jay Walters, male   DOB: 30-Aug-1931, 77 y.o.   MRN: [id number]   [facility]  Subjective: Patient doing well.  He slept better last night.  Objective: Vital signs in last 24 hours: Temp:  [98.2 F (36.8 C)-98.5 F (36.9 C)] 98.5 F (36.9 C) (12/03 0600) Pulse Rate:  [43-58] 43  (12/03 0600) Resp:  [16] 16  (12/03 0600) BP: (104-109)/(52-61) 109/61 mmHg (12/03 0600) SpO2:  [95 %-98 %] 95 % (12/03 0600) Last BM Date: 05/12/11  Intake/Output from previous day: 12/02 0701 - 12/03 0700 In: 670 [P.O.:360; I.V.:310] Out: 1725 [Urine:1725] Intake/Output this shift: Total I/O In: 240 [P.O.:240] Out: -   Medications Current Facility-Administered Medications  Medication Dose Route Frequency Provider Last Rate Last Dose  . 0.9 %  sodium chloride infusion  250 mL Intravenous PRN Joycelyn Schmid, MD 20 mL/hr at 05/14/11 2100 250 mL at 05/14/11 2100  . acetaminophen (TYLENOL) tablet 650 mg  650 mg Oral Q6H PRN Joycelyn Schmid, MD   650 mg at 05/09/11 2213  . aspirin chewable tablet 324 mg  324 mg Oral QPM Joycelyn Schmid, MD   324 mg at 05/14/11 1731  . cholecalciferol (VITAMIN D) tablet 2,000 Units  2,000 Units Oral Daily Joycelyn Schmid, MD   2,000 Units at 05/15/11 [phone]  . doxazosin (CARDURA) tablet 4 mg  4 mg Oral QPM Joycelyn Schmid, MD   4 mg at 05/14/11 1731  . finasteride (PROSCAR) tablet 5 mg  5 mg Oral QPM Joycelyn Schmid, MD   5 mg at 05/14/11 1731  . heparin ADULT infusion 100 units/ml (25000 units/250 ml)  1,250 Units/hr Intravenous Continuous Christian M Buettner, PHARMD 12.5 mL/hr at 05/15/11 1323 1,250 Units/hr at 05/15/11 1323  . multivitamins ther. w/minerals tablet 1 tablet  1 tablet Oral Daily Joycelyn Schmid, MD   1 tablet at 05/15/11 0923  . pantoprazole (PROTONIX) EC tablet 40 mg  40 mg Oral QPM Joycelyn Schmid, MD   40 mg at 05/14/11 1731  . phenytoin (DILANTIN) ER capsule 300 mg  300 mg Oral QHS Joycelyn Schmid, MD    300 mg at 05/14/11 2153  . polyethylene glycol (MIRALAX / GLYCOLAX) packet 17 g  17 g Oral Daily Vania Rea. Smith, PA   17 g at 05/15/11 1146  . prednisoLONE tablet 10 mg  10 mg Oral QPC breakfast Tara C Jernejcic, PA      . prednisoLONE tablet 20 mg  20 mg Oral QPC breakfast Tara C Jernejcic, PA      . predniSONE (DELTASONE) tablet 30 mg  30 mg Oral QPC breakfast Tara C Jernejcic, PA      . predniSONE (DELTASONE) tablet 40 mg  40 mg Oral QPC breakfast Tara C Jernejcic, PA      . predniSONE (DELTASONE) tablet 50 mg  50 mg Oral QPC breakfast Tara C Jernejcic, PA      . predniSONE (DELTASONE) tablet 60 mg  60 mg Oral QPC breakfast Tara C Jernejcic, PA   60 mg at 05/15/11 1145  . rosuvastatin (CRESTOR) tablet 20 mg  20 mg Oral Daily Joycelyn Schmid, MD   20 mg at 05/15/11 0923  . senna-docusate (Senokot-S) tablet 1 tablet  1 tablet Oral Daily PRN Joycelyn Schmid, MD   1 tablet at 05/10/11 2116  . traMADol (ULTRAM) tablet 100 mg  100 mg Oral Q8H PRN Joycelyn Schmid, MD   100 mg at 05/09/11 1533  . traZODone (DESYREL) tablet 25 mg  25 mg Oral QHS PRN Joycelyn Schmid, MD   25 mg at 05/09/11 2213  . warfarin (COUMADIN) tablet 2.5 mg  2.5 mg Oral ONCE-1800 Benny Lennert, PHARMD   2.5 mg at 05/14/11 1731  . warfarin (COUMADIN) tablet 2.5 mg  2.5 mg Oral ONCE-1800 Dennie Fetters, [facility]      . warfarin (COUMADIN) video 1 each  1 each Does not apply Once Emeline Gins, PHARMD        PE: General appearance: alert, cooperative and no distress Lungs: clear to auscultation bilaterally Heart: regular rhythm,  Rate slow. Extremities: NO LEE Pulses: 2+ and symmetric  Lab Results:   Basename 05/15/11 0600 05/14/11 0630 05/13/11 0800  WBC 7.8 10.3 8.3  HGB 11.9* 10.9* 11.8*  HCT 35.8* 32.6* 35.3*  PLT 163 136* 153   BMET No results found for this basename: NA:3,K:3,CL:3,CO2:3,GLUCOSE:3,BUN:3,CREATININE:3,CALCIUM:3 in the last 72 hours PT/INR  Basename 05/15/11 0600 05/14/11  0630 05/13/11 0800  LABPROT 22.7* 20.8* 16.4*  INR 1.96* 1.76* 1.30   Cholesterol No results found for this basename: CHOL in the last 72 hours Cardiac Enzymes No components found with this basename: TROPONIN:3, CKMB:3  Studies/Results:    Assessment/Plan  Principal Problem:  *Cervical myelopathy Active Problems:  Atrial flutter  S/P atrial septal defect closure, surgical  Bradycardia   Plan:  INR just below therapeutic(1.96).  BP controlled and stable.  HR slow but apparently the patient's norm.   LOS: 7 days    Jay Walters W 05/15/2011 1:54 PM

## 2011-05-16 ENCOUNTER — Inpatient Hospital Stay (HOSPITAL_COMMUNITY)
Admission: RE | Admit: 2011-05-16 | Discharge: 2011-05-25 | DRG: 945 | Disposition: A | Discharge status: Home Health | Payer: 59 | Source: Ambulatory Visit | Attending: Physical Medicine & Rehabilitation | Admitting: Physical Medicine & Rehabilitation

## 2011-05-16 ENCOUNTER — Encounter (HOSPITAL_COMMUNITY): Payer: Self-pay | Admitting: *Deleted

## 2011-05-16 DIAGNOSIS — K5730 Diverticulosis of large intestine without perforation or abscess without bleeding: Secondary | ICD-10-CM

## 2011-05-16 DIAGNOSIS — I4891 Unspecified atrial fibrillation: Secondary | ICD-10-CM

## 2011-05-16 DIAGNOSIS — IMO0002 Reserved for concepts with insufficient information to code with codable children: Secondary | ICD-10-CM

## 2011-05-16 DIAGNOSIS — Z5189 Encounter for other specified aftercare: Secondary | ICD-10-CM

## 2011-05-16 DIAGNOSIS — Z7901 Long term (current) use of anticoagulants: Secondary | ICD-10-CM

## 2011-05-16 DIAGNOSIS — K592 Neurogenic bowel, not elsewhere classified: Secondary | ICD-10-CM

## 2011-05-16 DIAGNOSIS — N390 Urinary tract infection, site not specified: Secondary | ICD-10-CM

## 2011-05-16 DIAGNOSIS — K5900 Constipation, unspecified: Secondary | ICD-10-CM

## 2011-05-16 DIAGNOSIS — M4712 Other spondylosis with myelopathy, cervical region: Secondary | ICD-10-CM

## 2011-05-16 DIAGNOSIS — G373 Acute transverse myelitis in demyelinating disease of central nervous system: Secondary | ICD-10-CM

## 2011-05-16 DIAGNOSIS — A498 Other bacterial infections of unspecified site: Secondary | ICD-10-CM

## 2011-05-16 DIAGNOSIS — G40909 Epilepsy, unspecified, not intractable, without status epilepticus: Secondary | ICD-10-CM

## 2011-05-16 DIAGNOSIS — K219 Gastro-esophageal reflux disease without esophagitis: Secondary | ICD-10-CM

## 2011-05-16 DIAGNOSIS — G8250 Quadriplegia, unspecified: Secondary | ICD-10-CM

## 2011-05-16 DIAGNOSIS — K449 Diaphragmatic hernia without obstruction or gangrene: Secondary | ICD-10-CM

## 2011-05-16 DIAGNOSIS — I4892 Unspecified atrial flutter: Secondary | ICD-10-CM

## 2011-05-16 DIAGNOSIS — G0489 Other myelitis: Secondary | ICD-10-CM

## 2011-05-16 DIAGNOSIS — I10 Essential (primary) hypertension: Secondary | ICD-10-CM

## 2011-05-16 DIAGNOSIS — N319 Neuromuscular dysfunction of bladder, unspecified: Secondary | ICD-10-CM

## 2011-05-16 DIAGNOSIS — Z79899 Other long term (current) drug therapy: Secondary | ICD-10-CM

## 2011-05-16 DIAGNOSIS — E785 Hyperlipidemia, unspecified: Secondary | ICD-10-CM

## 2011-05-16 LAB — COMPREHENSIVE METABOLIC PANEL
AST: 41 U/L — ABNORMAL HIGH (ref 0–37)
Albumin: 2.8 g/dL — ABNORMAL LOW (ref 3.5–5.2)
Alkaline Phosphatase: 80 U/L (ref 39–117)
Chloride: 102 mEq/L (ref 96–112)
Potassium: 4 mEq/L (ref 3.5–5.1)
Total Bilirubin: 0.2 mg/dL — ABNORMAL LOW (ref 0.3–1.2)

## 2011-05-16 LAB — CBC
HCT: 36.8 % — ABNORMAL LOW (ref 39.0–52.0)
MCH: 30.3 pg (ref 26.0–34.0)
MCHC: 33.3 g/dL (ref 30.0–36.0)
MCV: 90.8 fL (ref 78.0–100.0)
Platelets: 151 10*3/uL (ref 150–400)
Platelets: 181 10*3/uL (ref 150–400)
RBC: 4 MIL/uL — ABNORMAL LOW (ref 4.22–5.81)
RDW: 14.6 % (ref 11.5–15.5)
RDW: 14.7 % (ref 11.5–15.5)
WBC: 8.2 10*3/uL (ref 4.0–10.5)
WBC: 8.7 10*3/uL (ref 4.0–10.5)

## 2011-05-16 LAB — DIFFERENTIAL
Basophils Relative: 0 % (ref 0–1)
Eosinophils Absolute: 0 10*3/uL (ref 0.0–0.7)
Eosinophils Relative: 0 % (ref 0–5)
Monocytes Absolute: 0.3 10*3/uL (ref 0.1–1.0)
Monocytes Relative: 3 % (ref 3–12)

## 2011-05-16 LAB — APTT: aPTT: 122 seconds — ABNORMAL HIGH (ref 24–37)

## 2011-05-16 MED ORDER — ROSUVASTATIN CALCIUM 20 MG PO TABS: 20.0000 mg | ORAL_TABLET | Freq: Every day | ORAL | Status: DC

## 2011-05-16 MED ORDER — PROMETHAZINE HCL 25 MG/ML IJ SOLN: 12.5000 mg | Freq: Four times a day (QID) | INTRAMUSCULAR | Status: DC | PRN

## 2011-05-16 MED ORDER — VITAMIN D3 25 MCG (1000 UNIT) PO TABS
2000.0000 [IU] | ORAL_TABLET | Freq: Every day | ORAL | Status: DC
  Administered 2011-05-17 – 2011-05-25 (×9): 2000 [IU] via ORAL
  Filled 2011-05-16 (×11): qty 2

## 2011-05-16 MED ORDER — ACETAMINOPHEN 325 MG PO TABS: 650.0000 mg | ORAL_TABLET | Freq: Four times a day (QID) | ORAL | Status: DC | PRN

## 2011-05-16 MED ORDER — PNEUMOCOCCAL VAC POLYVALENT 25 MCG/0.5ML IJ INJ
0.5000 mL | INJECTION | INTRAMUSCULAR | Status: AC
  Administered 2011-05-17: 0.5 mL via INTRAMUSCULAR
  Filled 2011-05-16: qty 0.5

## 2011-05-16 MED ORDER — DOXAZOSIN MESYLATE 4 MG PO TABS
4.0000 mg | ORAL_TABLET | Freq: Every evening | ORAL | Status: DC
  Administered 2011-05-16 – 2011-05-24 (×9): 4 mg via ORAL
  Filled 2011-05-16 (×10): qty 1

## 2011-05-16 MED ORDER — POLYETHYLENE GLYCOL 3350 17 G PO PACK
17.0000 g | PACK | Freq: Every day | ORAL | Status: DC
  Filled 2011-05-16 (×3): qty 1

## 2011-05-16 MED ORDER — ROSUVASTATIN CALCIUM 20 MG PO TABS
20.0000 mg | ORAL_TABLET | Freq: Every day | ORAL | Status: DC
  Administered 2011-05-17 – 2011-05-24 (×8): 20 mg via ORAL
  Filled 2011-05-16 (×10): qty 1

## 2011-05-16 MED ORDER — PREDNISONE 20 MG PO TABS
40.0000 mg | ORAL_TABLET | Freq: Every day | ORAL | Status: AC
  Administered 2011-05-21 – 2011-05-22 (×2): 40 mg via ORAL
  Filled 2011-05-16 (×2): qty 2

## 2011-05-16 MED ORDER — PROMETHAZINE HCL 12.5 MG PO TABS: 12.5000 mg | ORAL_TABLET | Freq: Four times a day (QID) | ORAL | Status: DC | PRN

## 2011-05-16 MED ORDER — PREDNISONE 50 MG PO TABS
60.0000 mg | ORAL_TABLET | Freq: Every day | ORAL | Status: AC
  Administered 2011-05-17 – 2011-05-18 (×2): 60 mg via ORAL
  Filled 2011-05-16 (×2): qty 1

## 2011-05-16 MED ORDER — TRAMADOL HCL 50 MG PO TABS
100.0000 mg | ORAL_TABLET | Freq: Three times a day (TID) | ORAL | Status: DC | PRN
  Filled 2011-05-16: qty 2

## 2011-05-16 MED ORDER — PREDNISONE 50 MG PO TABS
50.0000 mg | ORAL_TABLET | Freq: Every day | ORAL | Status: AC
  Administered 2011-05-19 – 2011-05-20 (×2): 50 mg via ORAL
  Filled 2011-05-16 (×2): qty 1

## 2011-05-16 MED ORDER — PANTOPRAZOLE SODIUM 40 MG PO TBEC
40.0000 mg | DELAYED_RELEASE_TABLET | Freq: Every evening | ORAL | Status: DC
  Administered 2011-05-17 – 2011-05-24 (×8): 40 mg via ORAL
  Filled 2011-05-16 (×10): qty 1

## 2011-05-16 MED ORDER — WARFARIN SODIUM 4 MG PO TABS
4.0000 mg | ORAL_TABLET | Freq: Once | ORAL | Status: AC
  Administered 2011-05-16: 4 mg via ORAL
  Filled 2011-05-16: qty 1

## 2011-05-16 MED ORDER — ASPIRIN 81 MG PO CHEW
324.0000 mg | CHEWABLE_TABLET | Freq: Every evening | ORAL | Status: DC
  Filled 2011-05-16: qty 1

## 2011-05-16 MED ORDER — SENNA 8.6 MG PO TABS
1.0000 | ORAL_TABLET | Freq: Two times a day (BID) | ORAL | Status: DC
  Administered 2011-05-17 – 2011-05-20 (×7): 8.6 mg via ORAL
  Filled 2011-05-16 (×11): qty 1

## 2011-05-16 MED ORDER — PREDNISOLONE 5 MG PO TABS
20.0000 mg | ORAL_TABLET | Freq: Every day | ORAL | Status: DC
  Administered 2011-05-25: 20 mg via ORAL
  Filled 2011-05-16 (×2): qty 4

## 2011-05-16 MED ORDER — POLYETHYLENE GLYCOL 3350 17 G PO PACK
17.0000 g | PACK | Freq: Every day | ORAL | Status: DC | PRN
  Administered 2011-05-16: 17 g via ORAL
  Filled 2011-05-16: qty 1

## 2011-05-16 MED ORDER — PREDNISONE (PAK) 10 MG PO TABS: 10.0000 mg | ORAL_TABLET | Freq: Three times a day (TID) | ORAL | Status: DC

## 2011-05-16 MED ORDER — WARFARIN SODIUM 2.5 MG PO TABS
2.5000 mg | ORAL_TABLET | Freq: Once | ORAL | Status: DC
  Filled 2011-05-16: qty 1

## 2011-05-16 MED ORDER — ALUM & MAG HYDROXIDE-SIMETH 400-400-40 MG/5ML PO SUSP
30.0000 mL | ORAL | Status: DC | PRN
  Filled 2011-05-16: qty 30

## 2011-05-16 MED ORDER — POLYETHYLENE GLYCOL 3350 17 G PO PACK
17.0000 g | PACK | Freq: Every day | ORAL | Status: DC
  Administered 2011-05-17 – 2011-05-25 (×8): 17 g via ORAL
  Filled 2011-05-16 (×11): qty 1

## 2011-05-16 MED ORDER — PREDNISONE 20 MG PO TABS
30.0000 mg | ORAL_TABLET | Freq: Every day | ORAL | Status: AC
  Administered 2011-05-23 – 2011-05-24 (×2): 30 mg via ORAL
  Filled 2011-05-16 (×2): qty 1

## 2011-05-16 MED ORDER — PHENYTOIN SODIUM EXTENDED 100 MG PO CAPS
300.0000 mg | ORAL_CAPSULE | Freq: Every day | ORAL | Status: DC
  Administered 2011-05-16 – 2011-05-24 (×9): 300 mg via ORAL
  Filled 2011-05-16 (×10): qty 3

## 2011-05-16 MED ORDER — HEPARIN SOD (PORCINE) IN D5W 100 UNIT/ML IV SOLN
1250.0000 [IU]/h | INTRAVENOUS | Status: DC
Start: 2011-05-16 — End: 2011-05-16
  Filled 2011-05-16: qty 250

## 2011-05-16 MED ORDER — THERA M PLUS PO TABS
1.0000 | ORAL_TABLET | Freq: Every day | ORAL | Status: DC
  Filled 2011-05-16 (×3): qty 1

## 2011-05-16 MED ORDER — PROMETHAZINE HCL 12.5 MG RE SUPP: 12.5000 mg | Freq: Four times a day (QID) | RECTAL | Status: DC | PRN

## 2011-05-16 MED ORDER — BISACODYL 10 MG RE SUPP
10.0000 mg | Freq: Every day | RECTAL | Status: DC | PRN
  Filled 2011-05-16: qty 1

## 2011-05-16 MED ORDER — PREDNISONE (PAK) 10 MG PO TABS: 10.0000 mg | ORAL_TABLET | Freq: Four times a day (QID) | ORAL | Status: DC

## 2011-05-16 MED ORDER — SENNOSIDES-DOCUSATE SODIUM 8.6-50 MG PO TABS
1.0000 | ORAL_TABLET | Freq: Every evening | ORAL | Status: DC | PRN
  Administered 2011-05-16: 1 via ORAL
  Filled 2011-05-16: qty 1

## 2011-05-16 MED ORDER — TRAZODONE HCL 50 MG PO TABS: 25.0000 mg | ORAL_TABLET | Freq: Every evening | ORAL | Status: DC | PRN

## 2011-05-16 MED ORDER — FINASTERIDE 5 MG PO TABS
5.0000 mg | ORAL_TABLET | Freq: Every evening | ORAL | Status: DC
  Administered 2011-05-16 – 2011-05-24 (×9): 5 mg via ORAL
  Filled 2011-05-16 (×10): qty 1

## 2011-05-16 MED ORDER — PREDNISONE (PAK) 10 MG PO TABS: 20.0000 mg | ORAL_TABLET | Freq: Every evening | ORAL | Status: DC

## 2011-05-16 MED ORDER — ACETAMINOPHEN 325 MG PO TABS
325.0000 mg | ORAL_TABLET | ORAL | Status: DC | PRN
  Administered 2011-05-17: 325 mg via ORAL
  Filled 2011-05-16: qty 2

## 2011-05-16 MED ORDER — PREDNISOLONE 5 MG PO TABS: 10.0000 mg | ORAL_TABLET | Freq: Every day | ORAL | Status: DC

## 2011-05-16 NOTE — Discharge Summary (Signed)
Physician Discharge Summary   Patient ID: Jay Walters [id number] 77 y.o. November 22, 1931  Admit date: 05/08/2011  Discharge date and time: 05/16/2011, 9:12 AM  Admitting Physician: Joycelyn Schmid, MD   Discharge Physician: Thana Farr, MD  Admission Diagnoses: cervical myleopathy  Discharge Diagnoses: Transverse Myelitis  Admission Condition: fair  Discharged Condition: fair  Indication for Admission: Weakness and numbness in bilateral upper and lower extremities since later OCt 2012. Symptoms worsened over last few days.   Hospital Course: 77 year old male with hypercholesterolemia, prior atrial fibrillation, prior atrial septal defect status post repair, with progressive numbness and weakness of bilateral upper and lower extremities in setting of progressive cervical spinal cord lesion. Broad differential diagnosis includes autoimmune, inflammatory, infectious, postinfectious, post-vaccine, neoplastic or vascular etiologies. Patient underwent an extensive inpatient work up for above mentioned etiologies and steroids were started for possible transverse myelitis. Patient showed gradual improvement in his motor strength with steroid taper and was ultimately transferred to inpatient rehab.   Consults: cardiology, neurology and rehabilitation medicine  Significant Diagnostic Studies: Labs: Total protein was elevated in LP fluid without increase in number of WBC's.   Radiology: CT head: IMPRESSION: Atrophy with small vessel chronic ischemic changes of deep cerebral white matter.  No acute intracranial abnormalities. MR cervical spine without and with contrast:IMPRESSION: Patchy hyperintensity within the spinal cord from C2-3 through C5 bilaterally. The cord is not enlarged. There is patchy enhancement within the cord bilaterally. Differential diagnosis includes subacute cord infarction. Transverse myelitis is another possibility. Vascular malformation and cord ischemia is a possibility  however no enlarged cord or neural vessels are present suggesting a vascular malformation. Neoplasm not considered likely.    Angiography: CT angio Neck: IMPRESSION: Thrombus and / or atherosclerotic plaque carotid bifurcation bilaterally with less than 50% diameter stenosis of the proximal internal carotid artery bilaterally.  Findings raise possibility fibromuscular dysplasia involving the distal vertical segment of the right internal carotid artery and  the left vertebral artery at the C2 level. Cervical spondylotic changes. Please see recent MR report. Biapical pleural thickening without associated bony destruction. Bilateral upper lobe parenchymal changes. Follow up as noted on  recent chest CT. Mild asymmetry palatine tonsils and piriform sinus with slight fullness/underfilling on the left respectively. This may be normal although mucosal abnormality cannot be excluded.  Treatments: steroids: solu-medrol  Discharge Exam: Patient Vitals for the past 24 hrs:  BP Temp Temp src Pulse Resp SpO2  05/16/11 0600 103/39 mmHg 98.5 F (36.9 C) - 44  16  95 %  05/15/11 2200 124/66 mmHg 98.7 F (37.1 C) - 46  16  97 %  05/15/11 1400 112/60 mmHg 98.4 F (36.9 C) Oral 69  18  95 %   Neurologic Exam:  Mental Status:  Alert, oriented, thought content appropriate. Speech fluent without evidence of aphasia. Able to follow 3 step commands without difficulty.  Cranial Nerves:  II-Visual fields grossly intact.  III/IV/VI-Extraocular movements intact. Pupils reactive bilaterally.  V/VII-Smile symmetric  VIII-grossly intact  IX/X-normal gag  XI-bilateral shoulder shrug  XII-midline tongue extension  Motor: patient is unable to hold right upper extremity antigravity and need to use his left hand to help lift. With just his left shoulder he can can lift it to about 90 degrees. She shows 3/ 5 strength with shoulder strength and 4/ 4 with bicep/tricep flexion. Grips are equal. Right arm shows 5/5. Bilateral  lower extremities shows 4/5 strength. Able to lift both legs of bed without problem and hold them antigravity. . Normal  tone and bulk. bilateral mute toes.  Sensory: Pinprick and light touch intact throughout, bilaterally  Deep Tendon Reflexes: 2+ and symmetric throughout UE, 1+ in left patella and minimal in right patella and achilles.  Plantars: Mute bilaterally  Cerebellar: Normal finger-to-nose, normal rapid alternating movements and normal heel-to-shin test. Normal gait and station.    Disposition: To be discharged to inpatient rehab today.  Patient Instructions:  Current Discharge Medication List    CONTINUE these medications which have NOT CHANGED   Details  Cholecalciferol (VITAMIN D3) 1000 UNITS CAPS Take 2,000 Units by mouth daily.     doxazosin (CARDURA) 4 MG tablet Take 4 mg by mouth every evening.     finasteride (PROSCAR) 5 MG tablet Take 5 mg by mouth every evening.     Multiple Vitamins-Minerals (MULTIVITAMINS THER. W/MINERALS) TABS Take 1 tablet by mouth daily.      pantoprazole (PROTONIX) 40 MG tablet Take 40 mg by mouth every evening.     phenytoin (DILANTIN) 100 MG ER capsule Take 300 mg by mouth at bedtime.     traMADol (ULTRAM) 50 MG tablet Take 100 mg by mouth every 8 (eight) hours as needed. For pain Maximum dose= 8 tablets per day       STOP taking these medications     aspirin 81 MG tablet      atorvastatin (LIPITOR) 40 MG tablet      digoxin (LANOXIN) 0.125 MG tablet      Ginseng 250 MG CAPS      Milk Thistle-Turmeric (SILYMARIN) CAPS      naproxen sodium (ANAPROX) 220 MG tablet        Activity: activity as tolerated Diet: cardiac diet Wound Care: none needed  Follow-up with [place] [facility] in a few days.  Signed: Lars Mage MD [facility] Resident Pager [phone] 05/16/2011 9:12 AM

## 2011-05-16 NOTE — Progress Notes (Signed)
PHARMACY - ANTICOAGULATION CONSULT INITIAL NOTE  Pharmacy Consult for: Coumadin  Indication: Atrial flutter    Patient Data:   Allergies: No Known Allergies  Patient Measurements:   Adjusted Body Weight:   Vital Signs: Temp:  [98.5 F (36.9 C)-98.7 F (37.1 C)] 98.5 F (36.9 C) (12/04 1200) Pulse Rate:  [44-48] 48  (12/04 1200) Resp:  [16-18] 18  (12/04 1200) BP: (103-124)/(39-66) 113/51 mmHg (12/04 1200) SpO2:  [95 %-97 %] 95 % (12/04 1200)  Intake/Output from previous day: No intake or output data in the 24 hours ending 05/16/11 1611  Labs:  Basename 05/16/11 0535 05/15/11 0600 05/14/11 0630  HGB 10.8* 11.9* --  HCT 32.4* 35.8* 32.6*  PLT 151 163 136*  APTT -- -- --  LABPROT 23.4* 22.7* 20.8*  INR 2.04* 1.96* 1.76*  HEPARINUNFRC 0.46 0.49 0.65  CREATININE -- -- --  CKTOTAL -- -- --  CKMB -- -- --  TROPONINI -- -- --   The CrCl is unknown because both a height and weight (above a minimum accepted value) are required for this calculation.  Medical History: Past Medical History  Diagnosis Date  . GERD (gastroesophageal reflux disease)   . Dysphagia     with solids  . Seizures   . Hypertension   . Hiatal hernia   . Esophageal stricture   . Atrial fibrillation   . Diverticulosis   . Osteopenia   . Hemorrhoids     Scheduled medications:     . aspirin  324 mg Oral QPM  . cholecalciferol  2,000 Units Oral Daily  . doxazosin  4 mg Oral QPM  . finasteride  5 mg Oral QPM  . multivitamins ther. w/minerals  1 tablet Oral Daily  . pantoprazole  40 mg Oral QPM  . phenytoin  300 mg Oral QHS  . polyethylene glycol  17 g Oral Daily  . polyethylene glycol  17 g Oral Daily  . prednisoLONE  10 mg Oral QPC breakfast  . prednisoLONE  20 mg Oral QPC breakfast  . predniSONE  30 mg Oral QPC breakfast  . predniSONE  40 mg Oral QPC breakfast  . predniSONE  50 mg Oral QPC breakfast  . predniSONE  60 mg Oral QPC breakfast  . rosuvastatin  20 mg Oral q1800  . senna   1 tablet Oral BID  . warfarin  2.5 mg Oral ONCE-1800  . DISCONTD: predniSONE  10 mg Oral 3 x daily with food  . DISCONTD: predniSONE  10 mg Oral 4X daily taper  . DISCONTD: predniSONE  20 mg Oral Nightly  . DISCONTD: rosuvastatin  20 mg Oral Daily     Assessment:  77 y.o. male admitted on 05/16/2011, with h/o atrial flutter. Pharmacy consulted to manage Coumadin. INR is at-goal. IV heparin d/c.   Goal of Therapy:  1. INR 2-3  Plan:  1. Coumadin 4mg  po today. 2. Daily PT / INR.   Dineen Kid Thad Ranger, PharmD 05/16/2011, 4:11 PM

## 2011-05-16 NOTE — Progress Notes (Signed)
I have insurance approval and plan to admit patient today to the inpatient acute rehabilitation center. Patient is aware and in agreement. I have contacted the neurology P.A.. Please call me with questions. Pager [phone].

## 2011-05-16 NOTE — H&P (Signed)
Physical Medicine and Rehabilitation Admission H&P  Jay Walters is an 77 y.o. male.  No chief complaint on file.  :  HPI: 77 year old male with history of atrial fibrillation as well as atrial septal defect status post repair. Patient also with a remote history of head trauma with subsequent seizure disorder. Admitted November 1 6 with right leg weakness and stumbling gait since October of 2012. Last night bladder incontinence reported. Symptoms have since gradually worsened since November 22. MRI cervical spine showed progression of R sided C2-C5 T2 hyperintensity within the spinal cord . Placed on intravenous Solu-Medrol for suspect transverse myelitis versus possible spinal cord infarct. MRI of the brain was negative for any acute abnormalities. Followup cardiology services for asymptomatic atrial flutter. Placed on heparin and Coumadin therapy. There was plan for ablation in the near future.  CT angiogram neck showed thrombus and or atherosclerotic plaque carotid bifurcation bilaterally with less than 50% diameter stenosis of the proximal internal carotid artery bilaterally. Physical therapy evaluation on November 30 patient was total assist to ambulate 50 feet with 2 person hand held assistance. Physical medicine rehabilitation was consulted at the request of physical therapy to consider inpatient rehabilitation services  Review of Systems  Constitutional: Positive for malaise/fatigue.  Eyes: Negative for double vision.  Respiratory: Negative for cough.  Cardiovascular: Negative for chest pain.  Gastrointestinal: Positive for constipation. Negative for nausea.  Genitourinary: Positive for urgency.  Poor sensation of voiding with incont  Musculoskeletal: Positive for joint pain.  Neurological: Positive for seizures and weakness. Negative for dizziness and headaches.  Psychiatric/Behavioral: Negative for depression  Past Medical History   Diagnosis  Date   .  GERD (gastroesophageal reflux  disease)    .  Dysphagia      with solids   .  Seizures    .  Hypertension    .  Hiatal hernia    .  Esophageal stricture    .  Atrial fibrillation    .  Diverticulosis    .  Osteopenia    .  Hemorrhoids     Past Surgical History   Procedure  Date   .  Asd repair  [id number]   .  Knee arthroscopy    .  Inguinal hernia repair  2011     Right    Family History   Problem  Relation  Age of Onset   .  Heart disease  Father     Social History: reports that he has never smoked. He has never used smokeless tobacco. He reports that he drinks alcohol. He reports that he does not use illicit drugs.  Allergies: No Known Allergies  Medications Prior to Admission   Medication  Dose  Route  Frequency  Provider  Last Rate  Last Dose   .  0.9 % sodium chloride infusion  250 mL  Intravenous  PRN  Joycelyn Schmid, MD  20 mL/hr at 05/14/11 2100  250 mL at 05/14/11 2100   .  acetaminophen (TYLENOL) tablet 650 mg  650 mg  Oral  Q6H PRN  Joycelyn Schmid, MD   650 mg at 05/09/11 2213   .  aspirin chewable tablet 324 mg  324 mg  Oral  QPM  Joycelyn Schmid, MD   324 mg at 05/14/11 1731   .  cholecalciferol (VITAMIN D) tablet 2,000 Units  2,000 Units  Oral  Daily  Joycelyn Schmid, MD   2,000 Units at 05/15/11 [phone]   .  coumadin book 1 each  1 each  Does not apply  Once  Emeline Gins, PHARMD   1 each at 05/12/11 1741   .  doxazosin (CARDURA) tablet 4 mg  4 mg  Oral  QPM  Joycelyn Schmid, MD   4 mg at 05/14/11 1731   .  finasteride (PROSCAR) tablet 5 mg  5 mg  Oral  QPM  Joycelyn Schmid, MD   5 mg at 05/14/11 1731   .  gadobenate dimeglumine (MULTIHANCE) injection 12 mL  12 mL  Intravenous  Once PRN  Medication Radiologist   12 mL at 05/08/11 1901   .  heparin ADULT infusion 100 units/ml (25000 units/250 ml)  1,250 Units/hr  Intravenous  Continuous  Dickey Gave, PHARMD  12.5 mL/hr at 05/14/11 2100  12.5 mL/hr at 05/14/11 2100   .  iohexol (OMNIPAQUE) 300 MG/ML injection 100 mL  100 mL   Intravenous  Once PRN  Medication Radiologist   100 mL at 05/09/11 0959   .  iohexol (OMNIPAQUE) 350 MG/ML injection 50 mL  50 mL  Intravenous  Once PRN  Medication Radiologist   50 mL at 05/09/11 1835   .  methylPREDNISolone sodium succinate (SOLU-MEDROL) 1,000 mg in sodium chloride 0.9 % 50 mL IVPB  1,000 mg  Intravenous  QHS  Joycelyn Schmid, MD   1,000 mg at 05/13/11 0008   .  multivitamins ther. w/minerals tablet 1 tablet  1 tablet  Oral  Daily  Joycelyn Schmid, MD   1 tablet at 05/15/11 0923   .  pantoprazole (PROTONIX) EC tablet 40 mg  40 mg  Oral  QPM  Joycelyn Schmid, MD   40 mg at 05/14/11 1731   .  phenytoin (DILANTIN) ER capsule 300 mg  300 mg  Oral  QHS  Joycelyn Schmid, MD   300 mg at 05/14/11 2153   .  polyethylene glycol (MIRALAX / GLYCOLAX) packet 17 g  17 g  Oral  Daily  Vania Rea. Katrinka Blazing, [place]     .  prednisoLONE tablet 10 mg  10 mg  Oral  QPC breakfast  Tara C Jernejcic, PA     .  prednisoLONE tablet 20 mg  20 mg  Oral  QPC breakfast  Tara C Jernejcic, PA     .  predniSONE (DELTASONE) tablet 30 mg  30 mg  Oral  QPC breakfast  Tara C Jernejcic, PA     .  predniSONE (DELTASONE) tablet 40 mg  40 mg  Oral  QPC breakfast  Tara C Jernejcic, PA     .  predniSONE (DELTASONE) tablet 50 mg  50 mg  Oral  QPC breakfast  Tara C Jernejcic, PA     .  predniSONE (DELTASONE) tablet 60 mg  60 mg  Oral  QPC breakfast  Tara C Jernejcic, PA   60 mg at 05/14/11 1040   .  rosuvastatin (CRESTOR) tablet 20 mg  20 mg  Oral  Daily  Joycelyn Schmid, MD   20 mg at 05/15/11 0923   .  senna-docusate (Senokot-S) tablet 1 tablet  1 tablet  Oral  Daily PRN  Joycelyn Schmid, MD   1 tablet at 05/10/11 2116   .  traMADol (ULTRAM) tablet 100 mg  100 mg  Oral  Q8H PRN  Joycelyn Schmid, MD   100 mg at 05/09/11 1533   .  traZODone (DESYREL) tablet 25 mg  25 mg  Oral  QHS PRN  Joycelyn Schmid, MD   25 mg at 05/09/11  2213   .  warfarin (COUMADIN) tablet 2.5 mg  2.5 mg  Oral  ONCE-1800  Benny Lennert, PHARMD   2.5  mg at 05/14/11 1731   .  warfarin (COUMADIN) tablet 2.5 mg  2.5 mg  Oral  ONCE-1800  Dennie Fetters, [facility]     .  warfarin (COUMADIN) tablet 5 mg  5 mg  Oral  ONCE-1800  Emeline Gins, PHARMD   5 mg at 05/12/11 1742   .  warfarin (COUMADIN) tablet 5 mg  5 mg  Oral  ONCE-1800  Christian M Sargent, PHARMD   5 mg at 05/13/11 1715   .  warfarin (COUMADIN) video 1 each  1 each  Does not apply  Once  Emeline Gins, PHARMD     .  DISCONTD: 0.9 % sodium chloride infusion   Intravenous  Continuous  Joycelyn Schmid, MD     .  DISCONTD: aspirin chewable tablet 81 mg  81 mg  Oral  QPM  Joycelyn Schmid, MD     .  DISCONTD: digoxin (LANOXIN) tablet 62.5 mcg  62.5 mcg  Oral  Daily  Joycelyn Schmid, MD     .  DISCONTD: Ginseng CAPS 250 mg  1 capsule  Oral  Daily  Joycelyn Schmid, MD     .  DISCONTD: prednisoLONE tablet 10 mg  10 mg  Oral  QPC breakfast  Tara C Jernejcic, PA     .  DISCONTD: prednisoLONE tablet 20 mg  20 mg  Oral  QPC breakfast  Tara C Jernejcic, PA     .  DISCONTD: predniSONE (DELTASONE) tablet 30 mg  30 mg  Oral  QPC breakfast  Tara C Jernejcic, PA     .  DISCONTD: predniSONE (DELTASONE) tablet 40 mg  40 mg  Oral  QPC breakfast  Tara C Jernejcic, PA     .  DISCONTD: SILYMARIN CAPS 1 capsule  1 capsule  Oral  Daily  Joycelyn Schmid, MD      Medications Prior to Admission   Medication  Sig  Dispense  Refill   .  aspirin 81 MG tablet  Take 81 mg by mouth every evening.     Marland Kitchen  atorvastatin (LIPITOR) 40 MG tablet  Take 40 mg by mouth every evening.     .  Cholecalciferol (VITAMIN D3) 1000 UNITS CAPS  Take 2,000 Units by mouth daily.     Marland Kitchen  doxazosin (CARDURA) 4 MG tablet  Take 4 mg by mouth every evening.     .  finasteride (PROSCAR) 5 MG tablet  Take 5 mg by mouth every evening.     .  Ginseng 250 MG CAPS  Take 1 capsule by mouth daily.     .  Milk Thistle-Turmeric (SILYMARIN) CAPS  Take 1 capsule by mouth daily.     .  naproxen sodium (ANAPROX) 220 MG tablet   Take 440 mg by mouth 2 (two) times daily as needed. For pain     .  pantoprazole (PROTONIX) 40 MG tablet  Take 40 mg by mouth every evening.     .  phenytoin (DILANTIN) 100 MG ER capsule  Take 300 mg by mouth at bedtime.      Home:  Home Living  Lives With: Spouse;Son  Type of Home: House  Home Layout: Two level;Able to live on main level with bedroom/bathroom  Alternate Level Stairs-Rails: Right;Left;Can reach both  Alternate Level Stairs-Number of Steps: 13  Home Access: Stairs to  enter  Entrance Stairs-Rails: Can reach both;Left;Right  Entrance Stairs-Number of Steps: 11  Bathroom Shower/Tub: Tub only;Walk-in shower;Curtain;Door  [employer] Toilet: Standard  Home Adaptive Equipment: Crutches  Functional History:  Prior Function  Level of Independence: Independent with basic ADLs;Independent with homemaking with ambulation;Independent with gait;Independent with transfers  Able to Take Stairs?: Reciprically  Driving: Yes  Vocation: Part time employment  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  Leisure: Hobbies-yes (Comment)  Comments: golf  Functional Status:  Mobility:  Bed Mobility  Bed Mobility: Yes  Supine to Sit: 5: Supervision;HOB flat;With rails  Supine to Sit Details (indicate cue type and reason): Verbal cues for safest sequence.  Transfers  Transfers: Yes  Sit to Stand: 1: +2 Total assist;Patient percentage (comment);From bed (+2 total assist (pt=70%))  Sit to Stand Details (indicate cue type and reason): Assist for balance with translation of trunk over BOS. Blocked bilateral knees to prevent buckling.  Stand to Sit: 1: +2 Total assist;Patient percentage (comment);To chair/3-in-1 (+2 total assist (pt=70%))  Stand to Sit Details: Assist for trunk to slow descent/eccentric control of quadriceps. Cues for safest hand placement with mobility.  Ambulation/Gait  Ambulation/Gait: Yes  Ambulation/Gait  Assistance: 1: +2 Total assist;Patient percentage (comment) (+2 total assist (pt=70%))  Ambulation/Gait Assistance Details (indicate cue type and reason): Assist for balance with cues to extend at hips and trunk. Pt with tendency to ambulate in squated stance due to proximal weakness. Chair follow for safety.  Ambulation Distance (Feet): 50 Feet  Assistive device: 2 person hand held assist  Gait Pattern: Decreased step length - right;Decreased step length - left;Right foot flat;Left foot flat;Right flexed knee in stance;Left flexed knee in stance;Shuffle;Ataxic;Trunk flexed  Gait velocity: 0.54 feet/sec (Indicates increased risk for falls.)  Stairs: No  Wheelchair Mobility  Wheelchair Mobility: No  ADL:  ADL  Eating/Feeding: Minimal assistance;Simulated  Eating/Feeding Details (indicate cue type and reason): Patient with greater difficulty getting left hand to mouth compared to right  Where Assessed - Eating/Feeding: Edge of bed  Grooming: Simulated;Moderate assistance  Where Assessed - Grooming: (standing at EOB)  Upper Body Bathing: Minimal assistance;Simulated  Where Assessed - Upper Body Bathing: Sitting, bed  Lower Body Bathing: Minimal assistance;Simulated  Where Assessed - Lower Body Bathing: Sit to stand from chair  Upper Body Dressing: Performed;Moderate assistance  Where Assessed - Upper Body Dressing: Sitting, bed  Lower Body Dressing: Performed;Set up  Lower Body Dressing Details (indicate cue type and reason): don socks  Where Assessed - Lower Body Dressing: Sitting, bed  Toilet Transfer: Simulated;+2 Total assistance  Toilet Transfer Details (indicate cue type and reason): pt=70% with ambulation with bilateral hand held assist. bilateral knees ocassionally buckling- pt able to self correct as long as patient weight bearing through UEs  Toilet Transfer Method: Ambulating  Cognition:  Cognition  Arousal/Alertness: Awake/alert  Orientation Level: Oriented X4  Cognition    Arousal/Alertness: Awake/alert  Overall Cognitive Status: Appears within functional limits for tasks assessed  Orientation Level: Oriented X4  Blood pressure 109/61, pulse 43, temperature 98.5 F (36.9 C), temperature source Oral, resp. rate 16, height 5\' 9"  (1.753 m), weight 67 kg (147 lb 11.3 oz), SpO2 95.00%.  Physical Exam  Constitutional: He is oriented to person, place, and time. He appears well-developed.  HENT:  Head: Normocephalic.  Neck: Neck supple. No thyromegaly present.  Cardiovascular:  Irregular irregular  Pulmonary/Chest: No respiratory distress.  Abdominal: He exhibits no distension. There is no tenderness.  Musculoskeletal: He exhibits no edema.  Lymphadenopathy:  He has no cervical adenopathy.  Neurological: He is alert and oriented to person, place, and time.  Skin: Skin is warm and dry.  Psychiatric: Thought content normal.  Manual muscle testing reveals one out of 5 left deltoid 2+ out of 5 right deltoid, 3+ to 4/5 right biceps, 3+/5 left biceps, 4/5 right triceps 3+ to 4/5 left triceps ,4/5 grip. In the lower extremities patient has 2 plus in the hip flexors 4 minus in the hip extensors quadriceps and ankle dorsiflexors  Sensory exam reveals diminished pinprick in both hands from the wrist distally. He also has mild sensory loss along the soles of the feet left more than right.  Deep tendon reflexes are normal bilateral upper and lower extremities.  There is no evidence of muscle fasciculations  Results for orders placed during the hospital encounter of 05/08/11 (from the past 48 hour(s))   HEPARIN LEVEL (UNFRACTIONATED) Status: Normal    Collection Time    05/14/11 6:30 AM   Component  Value  Range  Comment    Heparin Unfractionated  0.65  0.30 - 0.70 (IU/mL)    CBC Status: Abnormal    Collection Time    05/14/11 6:30 AM   Component  Value  Range  Comment    WBC  10.3  4.0 - 10.5 (K/uL)     RBC  3.58 (*)  4.22 - 5.81 (MIL/uL)     Hemoglobin  10.9 (*)  13.0  - 17.0 (g/dL)     HCT  40.9 (*)  81.1 - 52.0 (%)     MCV  91.1  78.0 - 100.0 (fL)     MCH  30.4  26.0 - 34.0 (pg)     MCHC  33.4  30.0 - 36.0 (g/dL)     RDW  91.4  78.2 - 15.5 (%)     Platelets  136 (*)  150 - 400 (K/uL)    PROTIME-INR Status: Abnormal    Collection Time    05/14/11 6:30 AM   Component  Value  Range  Comment    Prothrombin Time  20.8 (*)  11.6 - 15.2 (seconds)     INR  1.76 (*)  0.00 - 1.49    HEPARIN LEVEL (UNFRACTIONATED) Status: Normal    Collection Time    05/15/11 6:00 AM   Component  Value  Range  Comment    Heparin Unfractionated  0.49  0.30 - 0.70 (IU/mL)    CBC Status: Abnormal    Collection Time    05/15/11 6:00 AM   Component  Value  Range  Comment    WBC  7.8  4.0 - 10.5 (K/uL)     RBC  3.95 (*)  4.22 - 5.81 (MIL/uL)     Hemoglobin  11.9 (*)  13.0 - 17.0 (g/dL)     HCT  95.6 (*)  21.3 - 52.0 (%)     MCV  90.6  78.0 - 100.0 (fL)     MCH  30.1  26.0 - 34.0 (pg)     MCHC  33.2  30.0 - 36.0 (g/dL)     RDW  08.6  57.8 - 15.5 (%)     Platelets  163  150 - 400 (K/uL)    PROTIME-INR Status: Abnormal    Collection Time    05/15/11 6:00 AM   Component  Value  Range  Comment    Prothrombin Time  22.7 (*)  11.6 - 15.2 (  seconds)     INR  1.96 (*)  0.00 - 1.49     No results found.  Post Admission Physician Evaluation:  1. Functional deficits secondary to C2-C5 cervical myelopathy and suspected transverse myelitis 2. Patient is admitted to receive collaborative, interdisciplinary care between the physiatrist, rehab nursing staff, and therapy team. 3. Patient's level of medical complexity and substantial therapy needs in context of that medical necessity cannot be provided at a lesser intensity of care such as a SNF. 4. Patient has experienced substantial functional loss from his/her baseline which was documented above under the "Functional History" and "Functional Status" headings. Judging by the patient's diagnosis, physical exam, and functional history, the  patient has potential for functional progress which will result in measurable gains while on inpatient rehab. These gains will be of substantial and practical use upon discharge in facilitating mobility and self-care at the household level. 5. Physiatrist will provide 24 hour management of medical needs as well as oversight of the therapy plan/treatment and provide guidance as appropriate regarding the interaction of the two. 6. 24 hour rehab nursing will assist with bladder management, bowel management, safety, skin/wound care, medication administration, pain management and patient education and help integrate therapy concepts, techniques,education, etc. 7. PT will assess and treat for: balance, endurance, strength and transferring. Goals are: independent with assistive device to supervision. 8. OT will assess and treat for: bathing, dressing, endurance, strength, toileting and transferring . Goals are: supervision to minimal assist 9. SLP will assess and treat for: Goals are: N/A. 10. Case Management and Social Worker will assess and treat for psychological issues and discharge planning. 11. Team conference will be held weekly to assess progress toward goals and to determine barriers to discharge. 12. Patient will receive at least 3 hours of therapy per day at least 5 days per week. 13. ELOS and Prognosis: 10-14 days excellent Medical Problem List and Plan:  1.Cervical Myelopathy-IV solumedrol completed.Prednisone taper per neurology recommendations  2. DVT Prophylaxis/Anticoagulation: Atrial flutter with history atrial septal defect with repair.IV heparin coumadin per pharm. Monitor for any bleeding episodes/ check platelets. Plan ablation in the near future as per cardiology  3.Neurogenic bowel and bladder-Remove foley and begin bladder training.Check PVR x 3. Continue proscar and cardura for now.Regulate bowel program with full education..  4.. Pain Management: ultram. Monitor with increased  activity especially in upper extremities.  5..History seizure disorder-Dilantin 300mg  daily.Monitor for seizure activity  No seizure activity for many years. Check levels as appropriate  6.Hyperlipidemia-crestor  7.GERD-protonix

## 2011-05-16 NOTE — Progress Notes (Signed)
CSW met with pt. Pt will be transferred to CIR today. CSW is no longer pursuing SNF. Pt is agreeable to this. CSW signing off, as no further clinical social work needs identified.   Dede Query, MSW, Theresia Majors [phone]

## 2011-05-17 DIAGNOSIS — I4891 Unspecified atrial fibrillation: Secondary | ICD-10-CM

## 2011-05-17 DIAGNOSIS — G8250 Quadriplegia, unspecified: Secondary | ICD-10-CM

## 2011-05-17 DIAGNOSIS — Z5189 Encounter for other specified aftercare: Secondary | ICD-10-CM

## 2011-05-17 DIAGNOSIS — I10 Essential (primary) hypertension: Secondary | ICD-10-CM

## 2011-05-17 DIAGNOSIS — G373 Acute transverse myelitis in demyelinating disease of central nervous system: Secondary | ICD-10-CM

## 2011-05-17 LAB — PROTIME-INR: INR: 1.68 — ABNORMAL HIGH (ref 0.00–1.49)

## 2011-05-17 MED ORDER — WARFARIN SODIUM 5 MG PO TABS
5.0000 mg | ORAL_TABLET | Freq: Once | ORAL | Status: AC
  Administered 2011-05-17: 5 mg via ORAL
  Filled 2011-05-17: qty 1

## 2011-05-17 MED ORDER — FLEET ENEMA 7-19 GM/118ML RE ENEM
1.0000 | ENEMA | Freq: Every day | RECTAL | Status: DC | PRN
  Administered 2011-05-17: 1 via RECTAL
  Filled 2011-05-17: qty 1

## 2011-05-17 MED ORDER — FERROUS SULFATE 325 (65 FE) MG PO TABS
325.0000 mg | ORAL_TABLET | Freq: Two times a day (BID) | ORAL | Status: DC
  Administered 2011-05-17 – 2011-05-25 (×17): 325 mg via ORAL
  Filled 2011-05-17 (×19): qty 1

## 2011-05-17 MED ORDER — ASPIRIN 81 MG PO CHEW
324.0000 mg | CHEWABLE_TABLET | Freq: Every day | ORAL | Status: DC
  Administered 2011-05-17 – 2011-05-19 (×3): 324 mg via ORAL
  Filled 2011-05-17: qty 1
  Filled 2011-05-17 (×2): qty 4

## 2011-05-17 NOTE — Progress Notes (Signed)
Recreational Therapy Assessment and Plan  Patient Details  Name: Jay Walters MRN: [id number] Date of Birth: [birth year]  Rehab Potential: Good ELOS: 10 days   Assessment Clinical Impression: 77 year old male with history of atrial fibrillation as well as atrial septal defect status post repair. Patient also with a remote history of head trauma with subsequent seizure disorder. Admitted November 1 6 with right leg weakness and stumbling gait since October of 2012. MRI cervical spine showed progression of R sided C2-C5 T2 hyperintensity within the spinal cord . Placed on intravenous Solu-Medrol for suspect transverse myelitis versus possible spinal cord infarct. MRI of the brain was negative for any acute abnormalities. Followup cardiology services for asymptomatic atrial flutter. Placed on heparin and Coumadin therapy. There was plan for ablation in the near future.  CT angiogram neck showed thrombus and or atherosclerotic plaque carotid bifurcation bilaterally with less than 50% diameter stenosis of the proximal internal carotid artery bilaterally. Patient transferred to CIR on 05/16/2011 .  Pt presents with decreased activity tolerance, decreased functional mobility, decreased balance limiting pts independence with leisure/community pursuits.    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 (love to experiment with foods) Leisure Participation Style: Alone Awareness of Community Resources: Multimedia programmer Appropriate for Education?: Yes Patient Agreeable to [employer]?: Yes Social interaction - Mood/Behavior: Cooperative Recreational Therapy Orientation Orientation -Reviewed with patient:  Available activity resources Strengths/Weaknesses Patient Strengths/Abilities: Willingness to participate;Active premorbidly Patient weaknesses: Physical limitations  Plan Rec Therapy Plan Is patient appropriate for Therapeutic Recreation?: Yes Rehab Potential: Good Treatment times per week: min 1 time per week .20 minutes Estimated Length of Stay: 10 days Therapy Goals Achieved By:: Recreation/leisure participation;Community reintegration/education;1:1 session;Adaptive equipment instruction;Group participation (Comment);Patient/family education;Provide activity resources in room  Recommendations for other services: None  Discharge Criteria: Patient will be discharged from TR if patient refuses treatment 3 consecutive times without medical reason.  If treatment goals not met, if there is a change in medical status, if patient makes no progress towards goals or if patient is discharged from hospital.  The above assessment, treatment plan, treatment alternatives and goals were discussed and mutually agreed upon: by patient  Demya Scruggs 05/17/2011, 3:34 PM

## 2011-05-17 NOTE — Progress Notes (Signed)
Occupational Therapy Assessment and Plan and Session Notes  Today's Date: 05/17/2011  Patient Details  Name: Jay Walters MRN: [id number] Date of Birth: 10-17-1931  OT Diagnosis: muscle weakness (generalized) Rehab Potential: Rehab Potential: Excellent ELOS: 7-10 days    Assessment & Plan Clinical Impression:77 year old male with history of atrial fibrillation as well as atrial septal defect status post repair. Patient also with a remote history of head trauma with subsequent seizure disorder. Admitted November 1 6 with right leg weakness and stumbling gait since October of 2012. Last night bladder incontinence reported. Symptoms have since gradually worsened since November 22. MRI cervical spine showed progression of R sided C2-C5 T2 hyperintensity within the spinal cord . Placed on intravenous Solu-Medrol for suspect transverse myelitis versus possible spinal cord infarct. MRI of the brain was negative for any acute abnormalities. Followup cardiology services for asymptomatic atrial flutter. Placed on heparin and Coumadin therapy. There was plan for ablation in the near future.  CT angiogram neck showed thrombus and or atherosclerotic plaque carotid bifurcation bilaterally with less than 50% diameter stenosis of the proximal internal carotid artery bilaterally.  Patient transferred to CIR on 05/16/2011 .  Patient's past medical history is significant for: GERD (gastroesophageal reflux disease)   Dysphagia  with solids   Seizures   Hypertension   Hiatal hernia   Esophageal stricture   Atrial fibrillation   Diverticulosis   Osteopenia   Hemorrhoids   Patient currently requires min with basic self-care skills and IADL secondary to muscle weakness.  Prior to hospitalization, patient could complete ADLs & IADLs independently.  Patient will benefit from skilled intervention to increase independence with basic self-care skills prior to discharge home with care partner and son.  Anticipate  patient will require intermittent supervision and TBD if any additional OT will be recommended at D/C.  OT - End of Session Activity Tolerance: Tolerates 30+ min activity without fatigue OT Assessment Rehab Potential: Excellent Barriers to Discharge: Other (comment) Barriers to Discharge Comments: none at this time OT Plan OT Frequency: 1-2 X/day, 60-90 minutes Estimated Length of Stay: 7-10 days OT Treatment/Interventions: Ambulation/gait training;Balance/vestibular training;Community reintegration;DME/adaptive equipment instruction;Functional mobility training;Pain management;Patient/family education;Self Care/advanced ADL retraining;Therapeutic Activities;Therapeutic Exercise;UE/LE Strength taining/ROM;UE/LE Coordination activities OT Recommendation Recommendations for Other Services:  (none at this time) Follow Up Recommendations: Other (comment) (OT: TBD) Equipment Recommended: 3 in 1 bedside comode;Tub/shower seat  Precautions/Restrictions  Precautions Precautions: Fall Required Braces or Orthoses: No Restrictions Weight Bearing Restrictions: No  General Chart Reviewed: Yes  Pain Pain Assessment Pain Assessment: No/denies pain Pain Score: 0-No pain  Home Living/Prior Functioning Home Living Lives With: Son;Spouse Home Layout: Two level;Able to live on main level with bedroom/bathroom Home Access: Stairs to enter Entrance Stairs-Number of Steps: 3  to enter house from outside; 11 to second level Bathroom Shower/Tub: Walk-in shower;Tub only;Door;Curtain Bathroom Toilet: Standard Bathroom Accessibility: Yes How Accessible: Accessible via walker IADL History Homemaking Responsibilities: Yes Meal Prep Responsibility: Secondary Laundry Responsibility: Primary Cleaning Responsibility: Primary Bill Paying/Finance Responsibility: Secondary Shopping Responsibility: Secondary Current License: Yes Mode of Transportation: Car Occupation: Part time employment Prior  Function Level of Independence: Independent with basic ADLs;Independent with homemaking with ambulation;Independent with gait;Independent with transfers Driving: Yes Vocation: Part time employment Leisure: Hobbies-yes (Comment) Comments: golfing  ADL ADL Eating: Set up Where Assessed-Eating: Edge of bed Grooming: Contact guard Where Assessed-Grooming: Standing at sink Upper Body Bathing: Supervision/safety Where Assessed-Upper Body Bathing: Edge of bed Lower Body Bathing: Supervision/safety Where Assessed-Lower Body Bathing: Edge of bed Upper Body Dressing:  Moderate assistance Where Assessed-Upper Body Dressing: Edge of bed Lower Body Dressing: Contact guard Where Assessed-Lower Body Dressing: Edge of bed (sit to stand) Toileting: Insurance underwriter: Not assessed Film/video editor: Simulated; steady assist  Vision/Perception  Vision - History Baseline Vision: Wears glasses all the time Patient Visual Report: No change from baseline Vision - Assessment Eye Alignment: Within Functional Limits   Cognition Overall Cognitive Status: Appears within functional limits for tasks assessed Arousal/Alertness: Awake/alert Orientation Level: Oriented X4  Sensation Sensation Light Touch: Appears Intact Stereognosis: Not tested Hot/Cold: Not tested Proprioception: Not tested Additional Comments: light touch sensation testing completed on B hands/UEs; appears intact. However, patient with complaints of tingling sensations from wrist to fingers primarily in left hand.  Coordination Gross Motor Movements are Fluid and Coordinated: No Fine Motor Movements are Fluid and Coordinated: Yes   Extremity/Trunk Assessment RUE Assessment RUE Assessment: Exceptions to [facility] RUE Overall AROM Comments: AROM is [facility] for aspects of daily living; however shoulder flexion is limited by decreased strength RUE Overall PROM Comments: WFL RUE Overall  Strength: Within Functional Limits for tasks performed (biceps, triceps, wrist, and finger strength is [facility]) Right Shoulder Flexion: 2-/5 Gross Grasp: Functional LUE Assessment LUE Assessment: Exceptions to WFL LUE Overall AROM Comments: AROM limited secondary to decreased strength throughout shoulder LUE Overall PROM Comments: WFL LUE Overall Strength Comments: biceps, triceps, wrist, and finger strength WFL Left Shoulder Flexion: 2-/5 Gross Grasp: Functional  Recommendations for other services: Other: none at this time  Discharge Criteria: Patient will be discharged from OT if patient refuses treatment 3 consecutive times without medical reason, if treatment goals not met, if there is a change in medical status, if patient makes no progress towards goals or if patient is discharged from hospital.  The above assessment, treatment plan, treatment alternatives and goals were discussed and mutually agreed upon: by patient  Session Notes  Session One [phone] Time Calculation (min): 65 min Individual Therapy No complaints of pain; just uncomfortable/tingling sensation in left hand  Initial 1:1 OT evaluation completed. Skilled treatment for focus on ADL retraining edge of bed level in sit to stand position, dynamic standing balance/endurance, functional ambulation around room without use of assistive device, standing at sink to perform grooming tasks. Left patient seated in recliner with phone and call bell within reach.   Session Two 1300-1400 Individual Therapy No complaints of pain  Treatment focus on functional mobility/ambulation with and without assistive device, simulated walk-in shower transfer onto shower seat with back with and without assistive device, toilet transfer with and without assistive device onto [facility] over toilet seat in room bathroom. Also focusing on increasing overall activity tolerance/endurance, and shoulder PROM exercises for patient to perform throughout day to  increase functional use of bilateral UEs   Kylle Lall 05/17/2011, 9:06 AM

## 2011-05-17 NOTE — Progress Notes (Signed)
Physical Therapy Assessment and Plan  Patient Details  Name: Jay Walters MRN: [id number] Date of Birth: [birth year]  PT Diagnosis: Abnormality of gait, Difficulty walking, Impaired sensation and Muscle weakness, decreased balance Rehab Potential: Excellent ELOS: 7-10 days   Today's Date: 05/17/2011 Time: 5621-3086 Time Calculation (min): 55 min  Assessment & Plan Clinical Impression: Patient is a 77 y.o. year old male with recent admission to the hospital on 04/28/11 with right leg weakness and stumbling gait since October of 2012. Pt with history of atrial fibrillation as well as atrial septal defect status post repair. Patient also with a remote history of head trauma with subsequent seizure disorder.Symptoms have since gradually worsened since November 22. MRI cervical spine showed progression of R sided C2-C5 T2 hyperintensity within the spinal cord . Placed on intravenous Solu-Medrol for suspect transverse myelitis versus possible spinal cord infarct. MRI of the brain was negative for any acute abnormalities. Followup cardiology services for asymptomatic atrial flutter. Placed on heparin and Coumadin therapy. There was plan for ablation in the near future.  CT angiogram neck showed thrombus and or atherosclerotic plaque carotid bifurcation bilaterally with less than 50% diameter stenosis of the proximal internal carotid artery bilaterally. Patient transferred to CIR on 05/16/2011 . Patient's past medical history is significant for: GERD (gastroesophageal reflux disease) ,Dysphagia, Seizures ,Hypertension ,Hiatal hernia, Esophageal stricture, Atrial fibrillation, Diverticulosis, and Osteopenia, and Hemorrhoids    Patient currently requires min with mobility secondary to muscle weakness, decreased coordination and decreased standing balance and decreased balance strategies.  Prior to hospitalization, patient was independent with mobility and lived with Spouse;Son in a   2 story home.  Home access  is 3Stairs to enter without rails, and 13 steps to the second floor. Pt would be able to stay on first floor if needed.  Patient will benefit from skilled PT intervention to maximize safe functional mobility, minimize fall risk and decrease caregiver burden for planned discharge home with intermittent assist.  Anticipate patient will benefit from HHPT vs. OPPT at discharge.  PT - End of Session Activity Tolerance: Endurance does not limit participation in activity PT Assessment Rehab Potential: Excellent Barriers to Discharge: None PT Plan PT Frequency: 1-2 X/day, 60-90 minutes Estimated Length of Stay: 7-10 days PT Treatment/Interventions: Ambulation/gait training;Balance/vestibular training;Community reintegration;DME/adaptive equipment instruction;Functional mobility training;Neuromuscular re-education;Pain management;Patient/family education;Stair training;Therapeutic Activities;Therapeutic Exercise;UE/LE Strength taining/ROM;UE/LE Coordination activities;Wheelchair propulsion/positioning PT Recommendation Follow Up Recommendations: Other (comment) (outpatient v home health) Equipment Recommended:  (cane v RW TBD)  Precautions/Restrictions Precautions Precautions: Fall Required Braces or Orthoses: No Restrictions Weight Bearing Restrictions: No   Pain Pain Assessment Pain Assessment: No/denies pain Pain Score: 0-No pain Home Living/Prior Functioning Home Living Lives With: Spouse;Son Home Layout: Two level;Able to live on main level with bedroom/bathroom Alternate Level Stairs-Number of Steps: 13 Home Access: Stairs to enter Entrance Stairs-Number of Steps: 3 Bathroom Shower/Tub: Walk-in shower;Tub only;Door;Curtain Bathroom Toilet: Standard Bathroom Accessibility: Yes How Accessible: Accessible via walker Prior Function Level of Independence: Independent with basic ADLs;Independent with homemaking with ambulation;Independent with gait;Independent with transfers Driving:  Yes Vocation: Part time employment Leisure: Hobbies-yes (Comment) Comments: golfing Vision/Perception  Vision - History Baseline Vision: Wears glasses all the time Patient Visual Report: No change from baseline Vision - Assessment Eye Alignment: Within Functional Limits  Cognition Overall Cognitive Status: Appears within functional limits for tasks assessed Arousal/Alertness: Awake/alert Orientation Level: Oriented X4 Sensation Sensation Light Touch: Impaired by gross assessment (decreased to light touch L > R) Stereognosis: Not tested Hot/Cold: Not tested Proprioception: Appears  Intact Additional Comments: light touch sensation testing completed on B hands/UEs; appears intact. However, patient with complaints of tingling sensations from wrist to fingers primarily in left hand.  Coordination Gross Motor Movements are Fluid and Coordinated: No, decreased coordination with lower extremities during gait Fine Motor Movements are Fluid and Coordinated: Yes     Mobility Transfers Transfers: Yes Sit to Stand: 4: Min assist Stand to Sit: 4: Min assist Locomotion  Ambulation Ambulation: Yes Ambulation/Gait Assistance: 4: Min assist Ambulation Distance (Feet): 60 Feet Ambulation/Gait Assistance Details: Tactile cues for posture;Verbal cues for gait pattern;Manual facilitation for weight shifting Ambulation/Gait Assistance Details (indicate cue type and reason): HHA, narrow BOS, decreased step length, cues for posture Gait Gait: Yes Gait Pattern: Decreased step length - right;Decreased step length - left;Shuffle;Trunk flexed Stairs / Additional Locomotion Stairs: Yes Stairs Assistance: 4: Min assist Stair Management Technique: Alternating pattern;Two rails;One rail Left Number of Stairs: 5  Wheelchair Mobility Wheelchair Mobility: No  Trunk/Postural Assessment  Cervical Assessment Cervical Assessment: Within Functional Limits Thoracic Assessment Thoracic Assessment:  (flexed  posture in standing, but WFL with cueing) Lumbar Assessment Lumbar Assessment: Within Functional Limits Postural Control Postural Control:  (decerased balance with lateral weightshifts)  Balance Balance Balance Assessed: Yes Static Sitting Balance Static Sitting - Level of Assistance: 5: Stand by assistance Dynamic Sitting Balance Dynamic Sitting - Level of Assistance: 5: Stand by assistance Static Standing Balance Static Standing - Balance Support: No upper extremity supported Static Standing - Level of Assistance: 4: Min assist Dynamic Standing Balance Dynamic Standing - Balance Support: During functional activity Dynamic Standing - Level of Assistance: 4: Min assist Extremity Assessment  RUE Assessment RUE Assessment: Exceptions to [facility] RUE AROM (degrees) RUE Overall AROM Comments: AROM is [facility] for aspects of daily living; however shoulder flexion is limited by decreased strength RUE PROM (degrees) RUE Overall PROM Comments: WFL RUE Strength RUE Overall Strength: Within Functional Limits for tasks performed (biceps, triceps, wrist, and finger strength is [facility]) Right Shoulder Flexion: 2-/5 Gross Grasp: Functional LUE Assessment LUE Assessment: Exceptions to WFL LUE AROM (degrees) LUE Overall AROM Comments: AROM limited secondary to decreased strength throughout shoulder LUE PROM (degrees) LUE Overall PROM Comments: WFL LUE Strength LUE Overall Strength Comments: biceps, triceps, wrist, and finger strength WFL Left Shoulder Flexion: 2-/5 Gross Grasp: Functional RLE Assessment RLE Assessment: Exceptions to [facility] RLE AROM (degrees) RLE Overall AROM Comments: AROM WFL RLE Strength RLE Overall Strength Comments: strength grossly 3+/5 LLE Assessment LLE Assessment: Exceptions to WFL LLE AROM (degrees) LLE Overall AROM Comments: AROM WFL LLE Strength LLE Overall Strength Comments: strength grossly 4-/5  Recommendations for other services: None  Discharge Criteria: Patient  will be discharged from PT if patient refuses treatment 3 consecutive times without medical reason, if treatment goals not met, if there is a change in medical status, if patient makes no progress towards goals or if patient is discharged from hospital.  The above assessment, treatment plan, treatment alternatives and goals were discussed and mutually agreed upon: by patient  Treatment initiated with focus on gait training without an AD to increase challenge to balance, dynamic balance for functional tasks at sink and in bathroom, and stair training.   [name] [facility] 05/17/2011, 10:46 AM

## 2011-05-17 NOTE — Progress Notes (Signed)
Physical Therapy Note  Patient Details  Name: Pope Brunty MRN: [id number] Date of Birth: 31-Jul-1931 Today's Date: 05/17/2011  Individual therapy, 1400-1430  Denies pain. Treatment session focused on gait training with RW to assist with balance defecit, cueing needed for posture and to stay close to RW, especially during turns but pt's step length increased with use of RW (120' x2) overall steadyA/close S. Issued HEP ([place] exercises) for balance, fall prevention, and general lower extremity strengthening. Completed standing hip abduction, hamstring curls, mini squats, heel and toe raises, LAQ, and bridging in supine for core activation x 10 reps each bilaterally.    [name] [facility] 05/17/2011, 2:29 PM

## 2011-05-17 NOTE — Progress Notes (Signed)
Social Work Assessment and Plan Assessment and Plan  Patient Name: Jay Walters  GNFAO'Z Date: 05/17/2011  Problem List:  Patient Active Problem List  Diagnoses  . GERD  . DYSPHAGIA  . Atrial flutter  . Seizure disorder  . BPH (benign prostatic hyperplasia)  . Migraine headache  . Osteopenia  . Vitamin D deficiency  . Osteoarthritis of right knee  . Cervical myelopathy  . S/P atrial septal defect closure, surgical  . Bradycardia  . Myelopathy    Past Medical History:  Past Medical History  Diagnosis Date  . GERD (gastroesophageal reflux disease)   . Dysphagia     with solids  . Seizures   . Hypertension   . Hiatal hernia   . Esophageal stricture   . Atrial fibrillation   . Diverticulosis   . Osteopenia   . Hemorrhoids     Past Surgical History:  Past Surgical History  Procedure Date  . Asd repair [id number]  . Knee arthroscopy   . Inguinal hernia repair 2011    Right    Discharge Planning  Discharge Planning Living Arrangements: Spouse/significant other;Children Support Systems: Spouse/significant other;Children Assistance Needed: May require supervision level at discharge. Do you have any problems obtaining your medications?: No Type of Residence: Private residence Home Care Services: No Patient expects to be discharged to:: Home with family Case Management Consult Needed: Yes (Comment) (RNCm- already involved)  Social/Family/Support Systems Social/Family/Support Systems Anticipated Caregiver: Wife and son who attends [employer] Ability/Limitations of Caregiver: Evenings wife works daytime, son attneds school in and out Caregiver Availability: Evenings only  Employment Status Employment Status Fish farm manager Issues: No issues Guardian/Conservator: None  Abuse/Neglect Abuse/Neglect Assessment (Assessment to be complete while patient is alone) Physical Abuse: Denies Verbal Abuse: Denies Sexual Abuse: Denies Exploitation of  patient/patient's resources: Denies Self-Neglect: Denies  Emotional Status Emotional Status Pt's affect, behavior adn adjustment status: Pt wants to get stronger and work on his balance to become independent again.  He feels today went well. Recent Psychosocial Issues: other medical issues Pyschiatric History: No history-deferred BDI due to coping appropriately with this diagnosis   No history of substance abuse  Patient/Family Perceptions, Expectations & Goals Pt/Family Perceptions, Expectations and Goals Pt/Family understanding of illness & functional limitations: Pt is able to explain his deficits and issues.  He is motivated to [employer] and be independent again.   Premorbid pt/family roles/activities: Husband, Employee, Customer service manager, Father, etc Anticipated changes in roles/activities/participation: Plans to resume Pt/family expectations/goals: Pt states: " I plan to get my balance back and get stronger, it went well today."  Wife reports;" I will do what I can to help."  [employer]: None Premorbid Home Care/DME Agencies: None Transportation available at discharge: Family  Discharge Visual merchandiser Resources: Media planner (specify);Medicare ([employer]-through wife) Surveyor, quantity Resources: Tourist information centre manager;Family Support Financial Screen Referred: No Living Expenses: Lives with family Money Management: Patient;Spouse Home Management: Both Patient/Family Preliminary Plans: Return home with wife and son assisting there will be times he will be alone.  he feels if he can get the hang of the walker he will be fine.  Clinical Impression:  Pleasant gentleman who looks frail sitting in the recliner chair.  He has always been independent and plans to be again. Hopefully can get to mod/i leve, so He will be safe at home.      Lucy Chris 05/17/2011

## 2011-05-17 NOTE — Progress Notes (Signed)
Pt with decreased sensation bilat upper/lower extremities and decreased ROM. Bruise to Left posterior arm from heparin infusion. States slight pain at times. Responds to Prn tylenol. Transfers with x1 Assist. Good appetite. No BM today. Will follow up with fleets enema post therapy today. Pt participating with therapies. Continue plan of care.

## 2011-05-17 NOTE — Progress Notes (Signed)
Patient ID: Jay Walters, male   DOB: 1931-08-23, 77 y.o.   MRN: [id number] Subjective/Complaints: Review of Systems  Gastrointestinal: Positive for constipation.  Musculoskeletal: Positive for myalgias.  Neurological: Positive for tingling and sensory change.  All other systems reviewed and are negative.     Objective: Vital Signs: Blood pressure 106/59, pulse 41, temperature 99.4 F (37.4 C), temperature source Oral, resp. rate 18, height 5\' 9"  (1.753 m), weight 67.1 kg (147 lb 14.9 oz), SpO2 96.00%. No results found.  Basename 05/16/11 1756 05/16/11 0535  WBC 8.7 8.2  HGB 12.0* 10.8*  HCT 36.8* 32.4*  PLT 181 151    Basename 05/16/11 1756  NA 139  K 4.0  CL 102  CO2 29  GLUCOSE 157*  BUN 17  CREATININE 0.83  CALCIUM 9.0   CBG (last 3)  No results found for this basename: GLUCAP:3 in the last 72 hours  Wt Readings from Last 3 Encounters:  05/16/11 67.1 kg (147 lb 14.9 oz)  05/14/11 67 kg (147 lb 11.3 oz)  04/18/11 61.689 kg (136 lb)    Physical Exam:  General appearance: alert and no distress Head: Normocephalic, without obvious abnormality, atraumatic Eyes: conjunctivae/corneas clear. PERRL, EOM's intact. Fundi benign. Ears: normal TM's and external ear canals both ears Nose: Nares normal. Septum midline. Mucosa normal. No drainage or sinus tenderness. Throat: lips, mucosa, and tongue normal; teeth and gums normal Neck: no adenopathy, no carotid bruit, no JVD, supple, symmetrical, trachea midline and thyroid not enlarged, symmetric, no tenderness/mass/nodules Back: symmetric, no curvature. ROM normal. No CVA tenderness. Resp: clear to auscultation bilaterally Cardio: regular rate and rhythm GI: soft, non-tender; bowel sounds normal; no masses,  no organomegaly Extremities: extremities normal, atraumatic, no cyanosis or edema Pulses: 2+ and symmetric Skin: Skin color, texture, turgor normal. No rashes or lesions Neurologic: C4 and C5 weakness. Deltoids 2/5,  Biceps 3+/5.  Tingling and mild sensory loss in hands and sole of left foot Incision/Wound: skin clear   Assessment/Plan: 1. Functional deficits secondary to C2 myelopathy/?transverse myelitis  which require 3+ hours per day of interdisciplinary therapy in a comprehensive inpatient rehab setting. Physiatrist is providing close team supervision and 24 hour management of active medical problems listed below. Physiatrist and rehab team continue to assess barriers to discharge/monitor patient progress toward functional and medical goals.  Evals underway   Mobility:         ADL:   Cognition: Cognition Orientation Level: Oriented X4 Cognition Orientation Level: Oriented X4   1.Cervical Myelopathy-IV solumedrol completed.Prednisone taper per neurology recommendations. Expect more clinical improvement over next week or so 2. DVT Prophylaxis/Anticoagulation: Atrial flutter with history atrial septal defect with repair.IV heparin coumadin per pharm. Monitor for any bleeding episodes/ check platelets. Plan ablation in the near future as per cardiology   3.Neurogenic bowel and bladder-Remove foley and begin bladder training.Check PVR x 3. Continue proscar and cardura for now.Regulate bowel program with full education..   4.. Pain Management: ultram. Monitor with increased activity especially in upper extremities.   5..History seizure disorder-Dilantin 300mg  daily.Monitor for seizure activity   No seizure activity for many years. Check levels as appropriate   6.Hyperlipidemia-crestor   7.GERD-protonix  8. Neurogenic bowel: Augment regimen today.  Still no bm last noc.  9. Anemia: Fe supplement  Recheck This week     LOS (Days) 1 A FACE TO FACE VISIT WAS PERFORMED WITH THIS PATIENT  Davionne Dowty T 05/17/2011, 7:54 AM

## 2011-05-17 NOTE — Progress Notes (Signed)
[facility] Individual Statement of Services  Patient Name:  Jay Walters  Date:  05/17/2011  Welcome to the [facility]patient [facility].  Our goal is to provide you with an individualized program based on your diagnosis and situation, designed to meet your specific needs.  With this comprehensive rehabilitation program, you will be expected to participate in at least 3 hours of rehabilitation therapies Monday-Friday, with modified therapy programming on the weekends.  Your rehabilitation program will include the following services:  Physical Therapy (PT), Occupational Therapy (OT), 24 hour per day rehabilitation nursing, Therapeutic Recreaction (TR), Case Management (RN and Child psychotherapist), Rehabilitation Medicine, Nutrition Services and Pharmacy Services  Weekly team conferences will be held on Tuesdays to discuss your progress.  Your RN Case Designer, television/film set will talk with you frequently to get your input and to update you on team discussions.  Team conferences with you and your family in attendance may also be held.  Estimated length of stay: 7-10 days  Overall predicted outcome: Modified independent  Depending on your progress and recovery, your program may change.  Your RN Case Estate agent will coordinate services and will keep you informed of any changes.  Your RN Sports coach and SW names and contact numbers are listed  below.  The following services may also be recommended but are not provided by the Inpatient Rehabilitation Center:   Driving Evaluations  Home Health Rehabiltiation Services  Outpatient Rehabilitatation [facility]  Vocational Rehabilitation   Arrangements will be made to provide these services after discharge if needed.  Arrangements include referral to agencies that provide these services.  Your insurance has been verified to be:  [facility] & Medicare Your primary doctor is: Dr Marlan Palau  Pertinent information will be  shared with your doctor and your insurance company.  Case Manager: Lutricia Horsfall, [facility] [phone]  Social Worker:  Dossie Der, [place] [phone]  Information discussed with and copy given to patient by: Meryl Dare, 05/17/2011

## 2011-05-17 NOTE — Progress Notes (Signed)
PHARMACY - ANTICOAGULATION CONSULT   Pharmacy Consult for: Coumadin  Indication: Atrial flutter    Patient Data:   Allergies: No Known Allergies  Patient Measurements: Height: 5\' 9"  (175.3 cm) Weight: 147 lb 14.9 oz (67.1 kg) IBW/kg (Calculated) : 70.7  Adjusted Body Weight:   Vital Signs: Temp:  [98.5 F (36.9 C)-99.5 F (37.5 C)] 99.4 F (37.4 C) (12/05 0500) Pulse Rate:  [41-50] 41  (12/05 0500) Resp:  [18] 18  (12/05 0500) BP: (106-129)/(51-60) 106/59 mmHg (12/05 0500) SpO2:  [95 %-99 %] 96 % (12/05 0500) Weight:  [147 lb 14.9 oz (67.1 kg)] 147 lb 14.9 oz (67.1 kg) (12/04 1600)  Intake/Output from previous day:  Intake/Output Summary (Last 24 hours) at 05/17/11 1011 Last data filed at 05/17/11 0748  Gross per 24 hour  Intake    480 ml  Output   1700 ml  Net  -1220 ml    Labs:  Basename 05/17/11 0646 05/16/11 1756 05/16/11 0535 05/15/11 0600  HGB -- 12.0* 10.8* --  HCT -- 36.8* 32.4* 35.8*  PLT -- 181 151 163  APTT -- 122* -- --  LABPROT 20.1* 22.2* 23.4* --  INR 1.68* 1.91* 2.04* --  HEPARINUNFRC -- -- 0.46 0.49  CREATININE -- 0.83 -- --  CKTOTAL -- -- -- --  CKMB -- -- -- --  TROPONINI -- -- -- --   Estimated Creatinine Clearance: 70.7 ml/min (by C-G formula based on Cr of 0.83).  Medical History: Past Medical History  Diagnosis Date  . GERD (gastroesophageal reflux disease)   . Dysphagia     with solids  . Seizures   . Hypertension   . Hiatal hernia   . Esophageal stricture   . Atrial fibrillation   . Diverticulosis   . Osteopenia   . Hemorrhoids     Scheduled medications:     . aspirin  324 mg Oral QPM  . cholecalciferol  2,000 Units Oral Daily  . doxazosin  4 mg Oral QPM  . ferrous sulfate  325 mg Oral BID WC  . finasteride  5 mg Oral QPM  . pantoprazole  40 mg Oral QPM  . phenytoin  300 mg Oral QHS  . pneumococcal 23 valent vaccine  0.5 mL Intramuscular Tomorrow-1000  . polyethylene glycol  17 g Oral Daily  . prednisoLONE  10  mg Oral QPC breakfast  . prednisoLONE  20 mg Oral QPC breakfast  . predniSONE  30 mg Oral QPC breakfast  . predniSONE  40 mg Oral QPC breakfast  . predniSONE  50 mg Oral QPC breakfast  . predniSONE  60 mg Oral QPC breakfast  . rosuvastatin  20 mg Oral q1800  . senna  1 tablet Oral BID  . warfarin  4 mg Oral ONCE-1800  . DISCONTD: multivitamins ther. w/minerals  1 tablet Oral Daily  . DISCONTD: polyethylene glycol  17 g Oral Daily  . DISCONTD: predniSONE  10 mg Oral 3 x daily with food  . DISCONTD: predniSONE  10 mg Oral 4X daily taper  . DISCONTD: predniSONE  20 mg Oral Nightly  . DISCONTD: rosuvastatin  20 mg Oral Daily  . DISCONTD: warfarin  2.5 mg Oral ONCE-1800     Assessment:  77 y.o. male admitted on 05/16/2011, with h/o atrial flutter. Pharmacy consulted to manage Coumadin. INR decreased to 1.68.   Goal of Therapy:  1. INR 2-3  Plan:  1. Coumadin 5mg  po today.  Consider addition of lovenox as INR now subtherapeutic. 2.  Daily PT / INR.  Masoud Nyce 05/17/2011, 10:11 AM

## 2011-05-17 NOTE — Progress Notes (Signed)
Patient information reviewed and entered into [employer] system by Demetrius Barrell, RN, CRRN, PPS Coordinator.  Information including medical coding and functional independence measure will be reviewed and updated through discharge.     Per nursing patient was given "Data Collection Information Summary for Patients in Inpatient Rehabilitation Facilities with attached "Privacy Act Statement-Health Care Records" upon admission.   

## 2011-05-18 LAB — URINALYSIS, ROUTINE W REFLEX MICROSCOPIC
Bilirubin Urine: NEGATIVE
Protein, ur: NEGATIVE mg/dL
Urobilinogen, UA: 1 mg/dL (ref 0.0–1.0)

## 2011-05-18 LAB — URINE MICROSCOPIC-ADD ON

## 2011-05-18 LAB — PROTIME-INR: Prothrombin Time: 21.6 seconds — ABNORMAL HIGH (ref 11.6–15.2)

## 2011-05-18 MED ORDER — CIPROFLOXACIN HCL 250 MG PO TABS
250.0000 mg | ORAL_TABLET | Freq: Two times a day (BID) | ORAL | Status: AC
  Administered 2011-05-18 – 2011-05-25 (×14): 250 mg via ORAL
  Filled 2011-05-18 (×16): qty 1

## 2011-05-18 MED ORDER — WARFARIN SODIUM 5 MG PO TABS
5.0000 mg | ORAL_TABLET | Freq: Once | ORAL | Status: DC
  Filled 2011-05-18: qty 1

## 2011-05-18 NOTE — Progress Notes (Signed)
PHARMACY - ANTICOAGULATION CONSULT   Pharmacy Consult for: Coumadin  Indication: Atrial flutter    Patient Data:   Allergies: No Known Allergies  Patient Measurements: Height: 5\' 9"  (175.3 cm) Weight: 147 lb 14.9 oz (67.1 kg) IBW/kg (Calculated) : 70.7  Adjusted Body Weight:   Vital Signs: Temp:  [97.4 F (36.3 C)-99 F (37.2 C)] 99 F (37.2 C) (12/06 0618) Pulse Rate:  [40-43] 43  (12/06 0618) Resp:  [19-20] 20  (12/06 0618) BP: (125-151)/(63-65) 151/63 mmHg (12/06 0618) SpO2:  [95 %-100 %] 95 % (12/06 0618)  Intake/Output from previous day:  Intake/Output Summary (Last 24 hours) at 05/18/11 1245 Last data filed at 05/18/11 0930  Gross per 24 hour  Intake    720 ml  Output   1753 ml  Net  -1033 ml    Labs:  Basename 05/18/11 0647 05/17/11 0646 05/16/11 1756 05/16/11 0535  HGB -- -- 12.0* 10.8*  HCT -- -- 36.8* 32.4*  PLT -- -- 181 151  APTT -- -- 122* --  LABPROT 21.6* 20.1* 22.2* --  INR 1.84* 1.68* 1.91* --  HEPARINUNFRC -- -- -- 0.46  CREATININE -- -- 0.83 --  CKTOTAL -- -- -- --  CKMB -- -- -- --  TROPONINI -- -- -- --   Estimated Creatinine Clearance: 70.7 ml/min (by C-G formula based on Cr of 0.83).  Medical History: Past Medical History  Diagnosis Date  . GERD (gastroesophageal reflux disease)   . Dysphagia     with solids  . Seizures   . Hypertension   . Hiatal hernia   . Esophageal stricture   . Atrial fibrillation   . Diverticulosis   . Osteopenia   . Hemorrhoids     Scheduled medications:     . aspirin  324 mg Oral Daily  . cholecalciferol  2,000 Units Oral Daily  . doxazosin  4 mg Oral QPM  . ferrous sulfate  325 mg Oral BID WC  . finasteride  5 mg Oral QPM  . pantoprazole  40 mg Oral QPM  . phenytoin  300 mg Oral QHS  . polyethylene glycol  17 g Oral Daily  . prednisoLONE  10 mg Oral QPC breakfast  . prednisoLONE  20 mg Oral QPC breakfast  . predniSONE  30 mg Oral QPC breakfast  . predniSONE  40 mg Oral QPC breakfast    . predniSONE  50 mg Oral QPC breakfast  . predniSONE  60 mg Oral QPC breakfast  . rosuvastatin  20 mg Oral q1800  . senna  1 tablet Oral BID  . warfarin  5 mg Oral ONCE-1800  . warfarin  5 mg Oral ONCE-1800  . DISCONTD: aspirin  324 mg Oral QPM     Assessment:  77 y.o. male admitted on 05/16/2011, with h/o atrial flutter. Pharmacy consulted to manage Coumadin. INR increased to 1.84 today  Goal of Therapy:  1. INR 2-3  Plan:  1. Coumadin 5mg  po today.  Consider addition of lovenox as INR still subtherapeutic. 2. Daily PT / INR.  Johnelle Tafolla 05/18/2011, 12:45 PM

## 2011-05-18 NOTE — Progress Notes (Signed)
Physical Therapy Session Note  Patient Details  Name: Labrian Torregrossa MRN: [id number] Date of Birth: 1932/05/27  Today's Date: 05/18/2011 Time: 1600-1705 Time Calculation (min): 65 min  Precautions: Precautions Precautions: Fall Required Braces or Orthoses: No Restrictions Weight Bearing Restrictions: No  General Chart Reviewed: Yes Pain Pain Assessment Pain Assessment: No/denies pain Mobility Bed Mobility Bed Mobility: No Transfers Transfers: Yes Sit to Stand: 5: Supervision;With upper extremity assist Sit to Stand Details: Verbal cues for precautions/safety;Verbal cues for technique Sit to Stand Details (indicate cue type and reason): vc's for hand placement Stand to Sit: With upper extremity assist;5: Supervision Stand to Sit Details (indicate cue type and reason): Verbal cues for technique;Verbal cues for precautions/safety Stand to Sit Details: vc's for hand placement Locomotion  Ambulation Ambulation: Yes Ambulation/Gait Assistance: 5: Supervision Ambulation Distance (Feet): 300 Feet (>385ft) Assistive device: Rolling walker Ambulation/Gait Assistance Details: Verbal cues for precautions/safety;Verbal cues for safe use of DME/AE;Verbal cues for gait pattern;Manual facilitation for weight shifting Ambulation/Gait Assistance Details (indicate cue type and reason): vc's for tall posture and gluteal activation Gait Gait: Yes Gait Pattern: Decreased step length - right;Decreased step length - left;Trunk flexed Stairs / Additional Locomotion Stairs: Yes  Exercises  Kinetron 30 cm/s 2 x 1 min to increase bilat. LE strength  Static balance activity on Kinetron set at 20 cm/s 2 x to increase LE strength, trunk control, hip stability and static standing balance to decrease risk of falls.  Dynamic balance activity on Kinetron set at 30 cm/s 2 x to increase LE strength, trunk control, hip stability and dynamic balance to decrease risk of falls.  Standing marching  2 x 45sec with unilateral UE assist focusing on bilat LE strength; vc's for tall posture, gluteal activity and high knees.  Other Treatments   Gait >358ft Supervision assist with RW focusing on walker technique, tall posture and gluteal activity to increase functional stability and decrease risk of falls during ambulation.  Dynamic gait activities walking while looking L, R and up in home environment, focusing on obstacle negotiation while looking around the room in order to decrease risk of falls in home environment.  Stairs supervision assist x24 sideways (x12 railing R ascending; x12 railing L ascending), focusing on gluteal activation, vc for tall posture in order to increase safe functional mobility at home and decrease risk of falls during stairs at home.  Backwards gait ~ 67ft min A with RW, focusing on tall posture, gluteal activation and keeping walker close to increase stability during functional mobility and decrease risk of falls.  Therapy/Group: Individual Therapy  Christianne Dolin 05/18/2011, 5:12 PM

## 2011-05-18 NOTE — Progress Notes (Signed)
Patient Details  Name: Jay Walters MRN: [id number] Date of Birth: Nov 13, 1931  Today's Date: 05/18/2011 Time: 11:05-11:35 Skilled Therapeutic Interventions/Progress Updates: Pt ambulated from room to dayroom using RW with close supervision, min verbal cues for walker safety.  Pt stood with Min A to putt golf balls and pick them up from floor.  Pt with 2 mild LOB backwards requiring Min A for steadying.  No c/o pain.  Therapy/Group: Individual Therapy  Activity Level: Moderate:  Level of assist: Min Assist  [name] 05/18/2011, 4:51 PM

## 2011-05-18 NOTE — Progress Notes (Signed)
Physical Therapy Session Note  Patient Details  Name: Jay Walters MRN: [id number] Date of Birth: [birth year]  Today's Date: 05/18/2011 Time: 4098-1191 Time Calculation (min): 25 min  Precautions: Precautions Precautions: Fall Required Braces or Orthoses: No Restrictions Weight Bearing Restrictions: No  Short Term Goals: PT Short Term Goal 1: =LTGS  Skilled Therapeutic Interventions/Progress Updates: Focus of treatment: gait training with RW to improve safe use of assistive device     General Pt. Up in wc   Vital Signs Therapy Vitals Temp: 97.6 F (36.4 C) Temp src: Oral Pulse Rate: 43  Resp: 18  BP: 130/74 mmHg Patient Position, if appropriate: Sitting Oxygen Therapy SpO2: 99 % O2 Device: None (Room air) Pain No complaint of pain      Locomotion Pt. ambulated 150 feet with rolling walker with Close Supervision and moderate instructional cues to stay closer to assistive device especially when turning or changing direction             Exercises     Other Treatments  Crosstrainer (NuSTeP) to increase tolerance to activity X 5 minutes Level 4. Resting HR= 54 Oxygen Sats > 95%   Therapy/Group: Individual Therapy  Jay Walters,JIM 05/18/2011, 3:48 PM

## 2011-05-18 NOTE — Progress Notes (Signed)
Occupational Therapy Session Note  Patient Details  Name: Jay Walters MRN: [id number] Date of Birth: 1931/06/25  Today's Date: 05/18/2011 Time: 1300-1400 Time Calculation (min): 60 min  Precautions: Precautions Precautions: Fall Required Braces or Orthoses: No Restrictions Weight Bearing Restrictions: No  Short Term Goals = Long Term Goals    Skilled Therapeutic Interventions/Progress Updates:    Therapeutic activity focusing on dynamic standing balance/tolerance/endurance, functional use of bilateral UEs, AAROM/PROM/&AROM to bilateral UEs, strengthening to bilateral UEs, trunk/hip strengthening activities, LE strengthening activities. Patient able to tolerate full 60 minutes of therapeutic activity with minimal rest breaks. Main focus of session was to increase ROM to bilateral UEs to increase functional independence with ADLs/IADLs.   Pain  No complaints of pain  Therapy/Group: Individual Therapy  Jay Walters 05/18/2011, 2:55 PM

## 2011-05-18 NOTE — Progress Notes (Signed)
Patient ID: [name], male   DOB: 02-13-1932, 77 y.o.   MRN: [id number] Patient ID: [name], male   DOB: 11/18/1931, 77 y.o.   MRN: [id number] Subjective/Complaints: Review of Systems  Gastrointestinal: Positive for constipation.  Musculoskeletal: Positive for myalgias.  Neurological: Positive for tingling and sensory change.  All other systems reviewed and are negative.   Urinary frequency and dysuria  Objective: Vital Signs: Blood pressure 151/63, pulse 43, temperature 99 F (37.2 C), temperature source Oral, resp. rate 20, height 5\' 9"  (1.753 m), weight 67.1 kg (147 lb 14.9 oz), SpO2 95.00%. No results found.  Basename 05/16/11 1756 05/16/11 0535  WBC 8.7 8.2  HGB 12.0* 10.8*  HCT 36.8* 32.4*  PLT 181 151    Basename 05/16/11 1756  NA 139  K 4.0  CL 102  CO2 29  GLUCOSE 157*  BUN 17  CREATININE 0.83  CALCIUM 9.0   CBG (last 3)  No results found for this basename: GLUCAP:3 in the last 72 hours  Wt Readings from Last 3 Encounters:  05/16/11 67.1 kg (147 lb 14.9 oz)  05/14/11 67 kg (147 lb 11.3 oz)  04/18/11 61.689 kg (136 lb)    Physical Exam:  General appearance: alert and no distress Head: Normocephalic, without obvious abnormality, atraumatic Eyes: conjunctivae/corneas clear. PERRL, EOM's intact. Fundi benign. Ears: normal TM's and external ear canals both ears Nose: Nares normal. Septum midline. Mucosa normal. No drainage or sinus tenderness. Throat: lips, mucosa, and tongue normal; teeth and gums normal Neck: no adenopathy, no carotid bruit, no JVD, supple, symmetrical, trachea midline and thyroid not enlarged, symmetric, no tenderness/mass/nodules Back: symmetric, no curvature. ROM normal. No CVA tenderness. Resp: clear to auscultation bilaterally Cardio: regular rate and rhythm GI: soft, non-tender; bowel sounds normal; no masses,  no organomegaly Extremities: extremities normal, atraumatic, no cyanosis or edema Pulses: 2+ and symmetric Skin:  Skin color, texture, turgor normal. No rashes or lesions Neurologic: C4 and C5 weakness. Deltoids 3- to 3/5, Biceps 3+-4/5.  Tingling and mild sensory loss in hands and sole of left foot Incision/Wound: skin clear   Assessment/Plan: 1. Functional deficits secondary to C2 myelopathy/?transverse myelitis  which require 3+ hours per day of interdisciplinary therapy in a comprehensive inpatient rehab setting. Physiatrist is providing close team supervision and 24 hour management of active medical problems listed below. Physiatrist and rehab team continue to assess barriers to discharge/monitor patient progress toward functional and medical goals.  Patient is extremely motivated   Mobility:   Transfers Transfers: Yes Sit to Stand: 4: Min assist Stand to Sit: 4: Min assist Ambulation/Gait Ambulation/Gait Assistance: 4: Min assist Ambulation/Gait Assistance Details (indicate cue type and reason): HHA, narrow BOS, decreased step length, cues for posture Ambulation Distance (Feet): 60 Feet Gait Pattern: Decreased step length - right;Decreased step length - left;Shuffle;Trunk flexed Stairs: Yes Stairs Assistance: 4: Min assist Stair Management Technique: Alternating pattern;Two rails;One rail Left Number of Stairs: 5  Wheelchair Mobility Wheelchair Mobility: No ADL:   Cognition: Cognition Overall Cognitive Status: Appears within functional limits for tasks assessed Arousal/Alertness: Awake/alert Orientation Level: Oriented X4 Cognition Arousal/Alertness: Awake/alert Orientation Level: Oriented X4   1.Cervical Myelopathy-IV solumedrol completed.Prednisone taper per neurology recommendations. Expect more clinical improvement over next week or so 2. DVT Prophylaxis/Anticoagulation: Atrial flutter with history atrial septal defect with repair.IV heparin coumadin per pharm. Monitor for any bleeding episodes/ check platelets. Plan ablation in the near future as per cardiology     3.Neurogenic bowel and bladder-Remove foley and begin bladder training.Check  PVR x 3. Continue proscar and cardura for now.Regulate bowel program with full education..  -check u/a c&s due to frequency and dysuria   4.. Pain Management: ultram. Monitor with increased activity especially in upper extremities.   5..History seizure disorder-Dilantin 300mg  daily.Monitor for seizure activity   No seizure activity for many years. Check levels as appropriate   6.Hyperlipidemia-crestor   7.GERD-protonix  8. Neurogenic bowel: Augment regimen today.  Still no bm last noc.  9. Anemia: Fe supplement  Recheck This week     LOS (Days) 2 A FACE TO FACE VISIT WAS PERFORMED WITH THIS PATIENT  SWARTZ,ZACHARY T 05/18/2011, 7:35 AM

## 2011-05-18 NOTE — Progress Notes (Signed)
Pt ambulate walker with x1 min Assist. No c/o pain. Urine sample collected and sent to lab for analysis. Results brought to Dan's attention for order follow-up for medication order. Good appetite. Administered Miralax this AM for continued bowel emptying. No BM today. Participating with therapy. Continue plan of care.

## 2011-05-18 NOTE — Progress Notes (Signed)
Occupational Therapy Session Note  Patient Details  Name: Clennon Nasca MRN: [id number] Date of Birth: 1932-01-25  Today's Date: 05/18/2011 Time: 1000-1100 Time Calculation (min): 60 min  No complaints of pain   Precautions: Precautions Precautions: Fall Required Braces or Orthoses: No Restrictions Weight Bearing Restrictions: No  Short Term Goals = Long Term Goals    Skilled Therapeutic Interventions/Progress Updates:    Skilled intervention engaging in ADL retraining at shower level in room bathroom. Focused on UB/LB bathing in sit to stand position using 3-in-1, UB/LB dressing in sit to stand position from edge of bed, functional mobility/ambulation using rolling walker, dynamic standing balance/tolerance/endurance exercises, increasing overall activity tolerance/endurance, and standing at sink for shaving task.    Therapy/Group: Individual Therapy  Duha Abair 05/18/2011, 11:48 AM

## 2011-05-18 NOTE — Progress Notes (Signed)
Overall Plan of Care [facility]) Patient Details Name: Jay Walters MRN: [id number] DOB: [birth year]  Diagnosis:    Primary Diagnosis:   Cervical myelopathy  Myelopathy Co-morbidities: UTI, neurogenic bladder, sz, htn  Functional Problem List  Patient demonstrates impairments in the following areas: Balance, Endurance, Medication Management, Motor, Perception, Safety and Sensory   Basic ADL's: grooming, bathing, dressing and toileting Advanced ADL's: simple meal preparation  Transfers:  bed mobility, bed to chair, toilet, tub/shower, car, furniture and floor Locomotion:  ambulation, wheelchair mobility and stairs  Additional Impairments:  Functional use of upper extremity  Anticipated Outcomes Item Anticipated Outcome  Eating/Swallowing    Basic self-care  Mod I  Tolieting  Mod I  Bowel/Bladder  Pt will be continent of bowel and bladder  Transfers  Mod I  Locomotion  Mod i gait, S stairs  Communication    Cognition    Pain  Pt pain level will be at tolerable level for patient comfort.  Safety/Judgment  Pt will have no injury while in hospital  Other     Therapy Plan: PT Frequency: 1-2 X/day, 60-90 minutes OT Frequency: 1-2 X/day, 60-90 minutes     Team Interventions: Item RN PT OT SLP SW TR Other  Self Care/Advanced ADL Retraining   x      Neuromuscular Re-Education  x x      Therapeutic Activities  x x      UE/LE Strength Training/ROM  x x      UE/LE Coordination Activities  x x      Visual/Perceptual Remediation/Compensation         DME/Adaptive Equipment Instruction  x x      Therapeutic Exercise  x x      Balance/Vestibular Training  x x      Patient/Family Education  x x      Cognitive Remediation/Compensation         Functional Mobility Training  x x      Ambulation/Gait Training  x x      Stair Training  x       Wheelchair Propulsion/Positioning  x x      Functional Tourist information centre manager Reintegration  x x        Dysphagia/Aspiration Film/video editor         Bladder Management x        Bowel Management x        Disease Management/Prevention x        Pain Management x x x      Medication Management x        Skin Care/Wound Management x        Splinting/Orthotics  x       Discharge Planning  x x      Psychosocial Support                            Team Discharge Planning: Destination:  Home Projected Follow-up:  OT:  TBD PT: HHPT v OPPT Projected Equipment Needs:  Bedside Commode, Information systems manager and Walker v Cane Patient/family involved in discharge planning:  Yes  MD ELOS: 7-10 days  Medical Rehab Prognosis:  Excellent Assessment: Patient has been admitted for CIR therapies. PT will be working with the patient at least 1-2 hours per day at least 5 days per week addressing mobility, adaptive equipment, balance, NMR, safety  with modified independent goals.  OT will be working with patient at least 1-2 hours per day at least 5 days per week addressing NMR, upper ext strength, ADL's, adaptive equipment, safety and fxnl mobility with Modified independent goals.  Nursing will work on bladder continence.  Patient is extremely motivated and often has to be reminded not to "over do it".

## 2011-05-19 MED ORDER — WARFARIN SODIUM 2.5 MG PO TABS
2.5000 mg | ORAL_TABLET | Freq: Once | ORAL | Status: AC
  Administered 2011-05-19: 2.5 mg via ORAL
  Filled 2011-05-19: qty 1

## 2011-05-19 NOTE — Progress Notes (Signed)
Occupational Therapy Session Note  Patient Details  Name: Jay Walters MRN: [id number] Date of Birth: 1932/05/08  Today's Date: 05/19/2011 Time: 9562-1308 Time Calculation (min): 45 min  Precautions: Precautions Precautions: Fall Required Braces or Orthoses: No Restrictions Weight Bearing Restrictions: No  Short Term Goals = Long Term Goals   Skilled Therapeutic Interventions/Progress Updates:    Therapeutic activity in kitchen focusing on functional mobility through ambulation with rolling walker in kitchen and dynamic standing balance/tolerance/endurance. Patient requires min verbal cues to extend hips secondary to tendency to bend over or flex hips during standing activities and during ambulation. Also discussed PROM/stretching exercises for patient to complete at home.   Pain  No complaints of pain  Therapy/Group: Individual Therapy  Brynn Reznik 05/19/2011, 1:53 PM

## 2011-05-19 NOTE — Progress Notes (Signed)
Physical Therapy Note  Patient Details  Name: Jay Walters MRN: [id number] Date of Birth: January 28, 1932 Today's Date: 05/19/2011  Individual therapy 1500-1530 (30 min) Denies pain. Treatment session focused on gait training with RW with emphasis on safety with AD (still requires cueing for turning or bringing walker into bathroom), posture with gait, gait on carpeted surfaces, and coordination of lower extremities. Nustep for general strengthening, activity tolerance, and repetition of movement x 8 min on level 5. Tolerated treatment well.  [name] [facility] 05/19/2011, 3:45 PM

## 2011-05-19 NOTE — Progress Notes (Signed)
Occupational Therapy Session Note  Patient Details  Name: Jay Walters MRN: [id number] Date of Birth: 11-06-1931  Today's Date: 05/19/2011 Time: 0830-0925 Time Calculation (min): 55 min  Precautions: Precautions Precautions: Fall Required Braces or Orthoses: No Restrictions Weight Bearing Restrictions: No  Short Term Goals = Long Term Goals   Skilled Therapeutic Interventions/Progress Updates:    Treatment emphasis on ADL retraining at shower level. Focused skilled intervention on functional mobility around room with use of rolling walker, patient gathering own clothes, safety with rolling walker, UB/LB bathing in sit to stand position using grab bars and 3-in-1, UB/LB dressing at edge of bed in sit to stand position. Patient at overall supervision level for aspects of daily living. Also engaged in ambulation using rolling walker to therapy gym for UE strengthening exercises and PROM to bilateral UEs to improve functional use of bilateral UEs.   Pain Pain Assessment Pain Score: 0-No pain  Therapy/Group: Individual Therapy  Romie Keeble 05/19/2011, 9:43 AM

## 2011-05-19 NOTE — Progress Notes (Signed)
PHARMACY - ANTICOAGULATION CONSULT   Pharmacy Consult for: Coumadin  Indication: Atrial flutter    Patient Data:   Allergies: No Known Allergies  Patient Measurements: Height: 5\' 9"  (175.3 cm) Weight: 147 lb 14.9 oz (67.1 kg) IBW/kg (Calculated) : 70.7  Adjusted Body Weight:   Vital Signs: Temp:  [97.6 F (36.4 C)-99.3 F (37.4 C)] 99.3 F (37.4 C) (12/07 0523) Pulse Rate:  [42-43] 42  (12/07 0523) Resp:  [18] 18  (12/07 0523) BP: (124-130)/(66-74) 124/66 mmHg (12/07 0523) SpO2:  [96 %-99 %] 96 % (12/07 0523)  Intake/Output from previous day:  Intake/Output Summary (Last 24 hours) at 05/19/11 1047 Last data filed at 05/19/11 0730  Gross per 24 hour  Intake    720 ml  Output   1125 ml  Net   -405 ml    Labs:  Basename 05/19/11 0635 05/18/11 0647 05/17/11 0646 05/16/11 1756  HGB -- -- -- 12.0*  HCT -- -- -- 36.8*  PLT -- -- -- 181  APTT -- -- -- 122*  LABPROT 24.1* 21.6* 20.1* --  INR 2.12* 1.84* 1.68* --  HEPARINUNFRC -- -- -- --  CREATININE -- -- -- 0.83  CKTOTAL -- -- -- --  CKMB -- -- -- --  TROPONINI -- -- -- --   Estimated Creatinine Clearance: 70.7 ml/min (by C-G formula based on Cr of 0.83).  Medical History: Past Medical History  Diagnosis Date  . GERD (gastroesophageal reflux disease)   . Dysphagia     with solids  . Seizures   . Hypertension   . Hiatal hernia   . Esophageal stricture   . Atrial fibrillation   . Diverticulosis   . Osteopenia   . Hemorrhoids     Scheduled medications:     . aspirin  324 mg Oral Daily  . cholecalciferol  2,000 Units Oral Daily  . ciprofloxacin  250 mg Oral BID  . doxazosin  4 mg Oral QPM  . ferrous sulfate  325 mg Oral BID WC  . finasteride  5 mg Oral QPM  . pantoprazole  40 mg Oral QPM  . phenytoin  300 mg Oral QHS  . polyethylene glycol  17 g Oral Daily  . prednisoLONE  10 mg Oral QPC breakfast  . prednisoLONE  20 mg Oral QPC breakfast  . predniSONE  30 mg Oral QPC breakfast  . predniSONE   40 mg Oral QPC breakfast  . predniSONE  50 mg Oral QPC breakfast  . rosuvastatin  20 mg Oral q1800  . senna  1 tablet Oral BID  . DISCONTD: warfarin  5 mg Oral ONCE-1800     Assessment:  77 y.o. male admitted on 05/16/2011, with h/o atrial flutter. Pharmacy consulted to manage Coumadin. INR increased to 2.12 today.  Dose not given yesterday.  Goal of Therapy:  1. INR 2-3  Plan:  1. Coumadin 2.5mg  po today.   2. Daily PT / INR.  3.   Will dc aspirin as INR is greater than 2  Okey Regal, PharmD 05/19/2011, 10:47 AM

## 2011-05-19 NOTE — Progress Notes (Signed)
Physical Therapy Note  Patient Details  Name: Jay Walters MRN: [id number] Date of Birth: 1931-12-11 Today's Date: 05/19/2011  Individual therapy 1030-1130 Denies pain. Treatment session focused on gait training with and without RW for increased challenge of balance, dynamic gait through obstacle course (stepping over objects, turns, and up/down 1 curb step), and focus on posture, coordination and gait velocity. Pt with improved control with gait pattern when slowing down a little. Car transfer training with S initially for cues for technique and progressed to modified independent with RW. Stair training with 1 rail (practiced R and L only due to opposite sides available at home) with S overall. Kinetron for LE strengthening in sitting and standing x 3 minutes seated x 1 min at a time in standing x 3 reps.   [name] [facility] 05/19/2011, 11:12 AM

## 2011-05-19 NOTE — Progress Notes (Signed)
Patient ID: Jay Walters, male   DOB: [birth year], 77 y.o.   MRN: [id number] Patient ID: Jay Walters, male   DOB: 1931-08-17, 77 y.o.   MRN: [id number] Patient ID: Jay Walters, male   DOB: 12/06/1931, 77 y.o.   MRN: [id number] Subjective/Complaints: Review of Systems  Gastrointestinal: Positive for constipation.  Musculoskeletal: Positive for myalgias.  Neurological: Positive for tingling and sensory change.  All other systems reviewed and are negative.   Urinary frequency and dysuria persistent  Objective: Vital Signs: Blood pressure 124/66, pulse 42, temperature 99.3 F (37.4 C), temperature source Oral, resp. rate 18, height 5\' 9"  (1.753 m), weight 67.1 kg (147 lb 14.9 oz), SpO2 96.00%. No results found.  Basename 05/16/11 1756  WBC 8.7  HGB 12.0*  HCT 36.8*  PLT 181    Basename 05/16/11 1756  NA 139  K 4.0  CL 102  CO2 29  GLUCOSE 157*  BUN 17  CREATININE 0.83  CALCIUM 9.0   CBG (last 3)  No results found for this basename: GLUCAP:3 in the last 72 hours  Wt Readings from Last 3 Encounters:  05/16/11 67.1 kg (147 lb 14.9 oz)  05/14/11 67 kg (147 lb 11.3 oz)  04/18/11 61.689 kg (136 lb)    Physical Exam:  General appearance: alert and no distress Head: Normocephalic, without obvious abnormality, atraumatic Eyes: conjunctivae/corneas clear. PERRL, EOM's intact. Fundi benign. Ears: normal TM's and external ear canals both ears Nose: Nares normal. Septum midline. Mucosa normal. No drainage or sinus tenderness. Throat: lips, mucosa, and tongue normal; teeth and gums normal Neck: no adenopathy, no carotid bruit, no JVD, supple, symmetrical, trachea midline and thyroid not enlarged, symmetric, no tenderness/mass/nodules Back: symmetric, no curvature. ROM normal. No CVA tenderness. Resp: clear to auscultation bilaterally Cardio: regular rate and rhythm GI: soft, non-tender; bowel sounds normal; no masses,  no organomegaly Extremities: extremities normal, atraumatic,  no cyanosis or edema Pulses: 2+ and symmetric Skin: Skin color, texture, turgor normal. No rashes or lesions Neurologic: C4 and C5 weakness. Deltoids 3- to 3/5, Biceps 3+-4/5.  Tingling and mild sensory loss in hands and sole of left foot.  Showing improvement Incision/Wound: skin clear   Assessment/Plan: 1. Functional deficits secondary to C2 myelopathy/?transverse myelitis  which require 3+ hours per day of interdisciplinary therapy in a comprehensive inpatient rehab setting. Physiatrist is providing close team supervision and 24 hour management of active medical problems listed below. Physiatrist and rehab team continue to assess barriers to discharge/monitor patient progress toward functional and medical goals.  Patient is extremely motivated. Sometimes needs to back off a bit!   Mobility: Bed Mobility Bed Mobility: No Transfers Transfers: Yes Sit to Stand: 5: Supervision;With upper extremity assist Sit to Stand Details (indicate cue type and reason): vc's for hand placement Stand to Sit: With upper extremity assist;5: Supervision Stand to Sit Details: vc's for hand placement Ambulation/Gait Ambulation/Gait Assistance: 5: Supervision Ambulation/Gait Assistance Details (indicate cue type and reason): vc's for tall posture and gluteal activation Ambulation Distance (Feet): 300 Feet (>335ft) Assistive device: Rolling walker Gait Pattern: Decreased step length - right;Decreased step length - left;Trunk flexed Stairs: Yes Stairs Assistance: 4: Min assist Stair Management Technique: Alternating pattern;Two rails;One rail Left Number of Stairs: 5  Wheelchair Mobility Wheelchair Mobility: No ADL:   Cognition: Cognition Overall Cognitive Status: Appears within functional limits for tasks assessed Arousal/Alertness: Awake/alert Orientation Level: Oriented X4 Cognition Arousal/Alertness: Awake/alert Orientation Level: Oriented X4   1.Cervical Myelopathy-IV solumedrol  completed.Prednisone taper per neurology recommendations. Expect more clinical  improvement over next week or so 2. DVT Prophylaxis/Anticoagulation: Atrial flutter with history atrial septal defect with repair.IV heparin coumadin per pharm. Monitor for any bleeding episodes/ check platelets. Plan ablation in the near future as per cardiology   3.Neurogenic bowel and bladder-Remove foley and begin bladder training.Check PVR x 3. Continue proscar and cardura for now.Regulate bowel program with full education..  -u/a positive.  Start empiric cipro   4.. Pain Management: ultram. Monitor with increased activity especially in upper extremities.   5..History seizure disorder-Dilantin 300mg  daily. No seizure activity  No seizure activity for many years. Check levels as appropriate   6.Hyperlipidemia-crestor   7.GERD-protonix  8. Neurogenic bowel: Augment regimen today.  Still no bm last noc.  9. Anemia: Fe supplement  Recheck This week     LOS (Days) 3 A FACE TO FACE VISIT WAS PERFORMED WITH THIS PATIENT  Jay Walters 05/19/2011, 7:55 AM

## 2011-05-19 NOTE — Progress Notes (Signed)
Ambulating with rolling walker with Minium assistance. Continent of bowel and bladder. Last bowel movement 05/18/11. Independent with urinal, requires staff to remove and empty. No request for prn pain medication this shift. Participating in therapy. Able to make needs known. Resting in chair between sessions. D/c pending. Continue with plan of care.

## 2011-05-20 LAB — PROTIME-INR: Prothrombin Time: 22.3 seconds — ABNORMAL HIGH (ref 11.6–15.2)

## 2011-05-20 LAB — URINE CULTURE

## 2011-05-20 MED ORDER — WARFARIN SODIUM 3 MG PO TABS
3.0000 mg | ORAL_TABLET | Freq: Once | ORAL | Status: AC
  Administered 2011-05-20: 3 mg via ORAL
  Filled 2011-05-20: qty 1

## 2011-05-20 NOTE — Progress Notes (Signed)
Physical Therapy Session Note  Patient Details  Name: Jay Walters MRN: [id number] Date of Birth: 1931-06-26  Today's Date: 05/20/2011 Time 1300-1355:Time Calculation (min): 55 min  Precautions: Precautions Precautions: Fall Required Braces or Orthoses: No Restrictions Weight Bearing Restrictions: No  Short Term Goals: PT Short Term Goal 1: =LTGS  Skilled Therapeutic Interventions/Progress Updates: Focus of treatment: Therapeutic activities to improve standing posture/safety        Vital Signs Therapy Vitals Temp: 98.5 F (36.9 C) Temp src: Oral Pulse Rate: 45  Resp: 18  BP: 101/46 mmHg Patient Position, if appropriate: Sitting Oxygen Therapy SpO2: 97 % O2 Device: None (Room air) Pain  no complaint of pain    Locomotion Pt ambulates 150 feet with Rw close supervision with moderate cues to stay close to assistive device when turning/sitting; Pt ambulates 150 feet without assistive device min to close supervision with forward flexed trunk         Balance Pt. Performed the following to improve dynamic standing balance: 1. Marching in place without assistive device min assist with one loss of balance 2 .Alternate stepping to 4 inch step 3. Up/down 4 inch step without assistive device min assist 4 dynamic activity- putting with golf club 4, stepping forward/sideways/backward with cues to increase retropulsion step length for safety          Therapy/Group: Individual Therapy  Kaimen Peine,JIM 05/20/2011, 3:50 PM

## 2011-05-20 NOTE — Progress Notes (Signed)
Patient ID: Jay Walters, male   DOB: 10-May-1932, 77 y.o.   MRN: [id number] Patient ID: Jay Walters, male   DOB: Nov 24, 1931, 77 y.o.   MRN: [id number] Patient ID: Jay Walters, male   DOB: [birth year], 77 y.o.   MRN: [id number] Patient ID: Jay Walters, male   DOB: 10/31/1931, 77 y.o.   MRN: [id number] Subjective/Complaints: Review of Systems  Gastrointestinal: Positive for constipation.  Musculoskeletal: Positive for myalgias.  Neurological: Positive for tingling and sensory change.  All other systems reviewed and are negative.   Urinary frequency and dysuria persistent  Objective: Vital Signs: Blood pressure 111/61, pulse 68, temperature 98.7 F (37.1 C), temperature source Oral, resp. rate 18, height 5\' 9"  (1.753 m), weight 67.1 kg (147 lb 14.9 oz), SpO2 97.00%. No results found. No results found for this basename: WBC:2,HGB:2,HCT:2,PLT:2 in the last 72 hours No results found for this basename: NA:2,K:2,CL:2,CO2:2,GLUCOSE:2,BUN:2,CREATININE:2,CALCIUM:2 in the last 72 hours CBG (last 3)  No results found for this basename: GLUCAP:3 in the last 72 hours  Wt Readings from Last 3 Encounters:  05/16/11 67.1 kg (147 lb 14.9 oz)  05/14/11 67 kg (147 lb 11.3 oz)  04/18/11 61.689 kg (136 lb)    Physical Exam:  General appearance: alert and no distress Head: Normocephalic, without obvious abnormality, atraumatic Eyes: conjunctivae/corneas clear. PERRL, EOM's intact. Fundi benign. Ears: normal TM's and external ear canals both ears Nose: Nares normal. Septum midline. Mucosa normal. No drainage or sinus tenderness. Throat: lips, mucosa, and tongue normal; teeth and gums normal Neck: no adenopathy, no carotid bruit, no JVD, supple, symmetrical, trachea midline and thyroid not enlarged, symmetric, no tenderness/mass/nodules Back: symmetric, no curvature. ROM normal. No CVA tenderness. Resp: clear to auscultation bilaterally Cardio: regular rate and rhythm GI: soft, non-tender; bowel sounds  normal; no masses,  no organomegaly Extremities: extremities normal, atraumatic, no cyanosis or edema Pulses: 2+ and symmetric Skin: Skin color, texture, turgor normal. No rashes or lesions Neurologic: C4 and C5 weakness. Deltoids 3- to 3/5, Biceps 3+-4/5.  Tingling and mild sensory loss in hands and sole of left foot.  Showing improvement Incision/Wound: skin clear   Assessment/Plan: 1. Functional deficits secondary to C2 myelopathy/?transverse myelitis  which require 3+ hours per day of interdisciplinary therapy in a comprehensive inpatient rehab setting. Physiatrist is providing close team supervision and 24 hour management of active medical problems listed below. Physiatrist and rehab team continue to assess barriers to discharge/monitor patient progress toward functional and medical goals.  Patient is extremely motivated. Sometimes needs to back off a bit!   Mobility: Bed Mobility Bed Mobility: No Transfers Transfers: Yes Sit to Stand: 5: Supervision;With upper extremity assist Sit to Stand Details (indicate cue type and reason): vc's for hand placement Stand to Sit: With upper extremity assist;5: Supervision Stand to Sit Details: vc's for hand placement Ambulation/Gait Ambulation/Gait Assistance: 5: Supervision Ambulation/Gait Assistance Details (indicate cue type and reason): vc's for tall posture and gluteal activation Ambulation Distance (Feet): 300 Feet (>379ft) Assistive device: Rolling walker Gait Pattern: Decreased step length - right;Decreased step length - left;Trunk flexed Stairs: Yes Stairs Assistance: 4: Min assist Stair Management Technique: Alternating pattern;Two rails;One rail Left Number of Stairs: 5  Wheelchair Mobility Wheelchair Mobility: No ADL:   Cognition: Cognition Overall Cognitive Status: Appears within functional limits for tasks assessed Arousal/Alertness: Awake/alert Orientation Level: Oriented X4 Cognition Arousal/Alertness:  Awake/alert Orientation Level: Oriented X4   1.Cervical Myelopathy-IV solumedrol completed.Prednisone taper per neurology recommendations. Expect more clinical improvement over next week or so 2. DVT Prophylaxis/Anticoagulation:  Atrial flutter with history atrial septal defect with repair.IV heparin coumadin per pharm. Monitor for any bleeding episodes/ check platelets. Plan ablation in the near future as per cardiology   3.Neurogenic bowel and bladder-Remove foley and begin bladder training.Check PVR x 3. Continue proscar and cardura for now.Regulate bowel program with full education..  -E Coli UTI, sensitive to cipro.    4.. Pain Management: ultram. Monitor with increased activity especially in upper extremities.   -shoulder sx are likely due to deltoid, RTC weakness.  Advised support of arms when in bed (sleeps with HOB elevated).  Can use heat also  5..History seizure disorder-Dilantin 300mg  daily. No seizure activity.   6.Hyperlipidemia-crestor   7.GERD-protonix  8. Neurogenic bowel: Augment regimen today.  Still no bm last noc.  9. Anemia: Fe supplement  Recheck This week     LOS (Days) 4 A FACE TO FACE VISIT WAS PERFORMED WITH THIS PATIENT  Tirsa Gail T 05/20/2011, 7:46 AM

## 2011-05-20 NOTE — Progress Notes (Signed)
Patient alert and oriented x 3- patient uses call bell appropriately. Patient had incontinent episode of stool during therapy today. Patient reported that he thought he only had to urinate. Patient using urinal to void. Patient able to use rolling walker with minimum assist. Patient has decreased ROM to left arm. Patient feet dry and scaly. Patient BLE weak. Patient has some brusing to left arm. Patient denies any pain. Continue with plan of care. No new complaints noted. Appetite fair.

## 2011-05-20 NOTE — Progress Notes (Signed)
PHARMACY - ANTICOAGULATION CONSULT   Pharmacy Consult for: Coumadin  Indication: Atrial flutter    Patient Data:   Allergies: No Known Allergies  Patient Measurements: Height: 5\' 9"  (175.3 cm) Weight: 147 lb 14.9 oz (67.1 kg) IBW/kg (Calculated) : 70.7  Adjusted Body Weight:   Vital Signs: Temp:  [98.7 F (37.1 C)-98.8 F (37.1 C)] 98.7 F (37.1 C) (12/08 0600) Pulse Rate:  [51-68] 68  (12/08 0600) Resp:  [15-18] 18  (12/08 0600) BP: (97-111)/(54-61) 111/61 mmHg (12/08 0600) SpO2:  [97 %] 97 % (12/08 0600)  Intake/Output from previous day:  Intake/Output Summary (Last 24 hours) at 05/20/11 1354 Last data filed at 05/20/11 1300  Gross per 24 hour  Intake    240 ml  Output    651 ml  Net   -411 ml    Labs:  Basename 05/20/11 0600 05/19/11 0635 05/18/11 0647  HGB -- -- --  HCT -- -- --  PLT -- -- --  APTT -- -- --  LABPROT 22.3* 24.1* 21.6*  INR 1.92* 2.12* 1.84*  HEPARINUNFRC -- -- --  CREATININE -- -- --  CKTOTAL -- -- --  CKMB -- -- --  TROPONINI -- -- --   Estimated Creatinine Clearance: 70.7 ml/min (by C-G formula based on Cr of 0.83).  Medical History: Past Medical History  Diagnosis Date  . GERD (gastroesophageal reflux disease)   . Dysphagia     with solids  . Seizures   . Hypertension   . Hiatal hernia   . Esophageal stricture   . Atrial fibrillation   . Diverticulosis   . Osteopenia   . Hemorrhoids     Scheduled medications:     . cholecalciferol  2,000 Units Oral Daily  . ciprofloxacin  250 mg Oral BID  . doxazosin  4 mg Oral QPM  . ferrous sulfate  325 mg Oral BID WC  . finasteride  5 mg Oral QPM  . pantoprazole  40 mg Oral QPM  . phenytoin  300 mg Oral QHS  . polyethylene glycol  17 g Oral Daily  . prednisoLONE  10 mg Oral QPC breakfast  . prednisoLONE  20 mg Oral QPC breakfast  . predniSONE  30 mg Oral QPC breakfast  . predniSONE  40 mg Oral QPC breakfast  . predniSONE  50 mg Oral QPC breakfast  . rosuvastatin  20 mg  Oral q1800  . senna  1 tablet Oral BID  . warfarin  2.5 mg Oral ONCE-1800     Assessment:  77 y.o. male admitted on 05/16/2011, with h/o atrial flutter. Pharmacy consulted to manage Coumadin. INR decreased today to 1.92.  Dose not given 12/6 and lower dose given 12/7.  Goal of Therapy:  1. INR 2-3  Plan:  1. Coumadin 3 mg po today.   2. Daily PT / INR.   Layali Freund,PharmD 05/20/2011, 1:54 PM

## 2011-05-20 NOTE — Progress Notes (Signed)
Occupational  Therapy Note  Patient Details  Name: Jay Walters MRN: [id number] Date of Birth: 03/01/1932 Today's Date: 05/20/2011  Time: 1030 - 1115 36 Min  Pain:3/10 left shoulder. New onset. No request for meds.  Skilled Intervention: ADL retraining @ shower level using BSC with overall S RW level. Cues for safety. Cues to use grab bars and RW for support to compensate for balance. Pt expressed desire to work on left shoulder and arm. Will address this pm session.  Excellent performance.  Individual session.  2nd session: Time: 1230 - 1300 30 min  Pain - none Skilled intervention: LUE neuro rehab with focus on shoulder AAROM and strengthening in G eliminated positions. Taught pt shoulder flexion and shoulder scaption  Exercises. Pt completing "finger climbing" exercises on bed in standing. Also educated on PNF shoulder ex in supine.Will give handouts.  Individual session.   Caitlyne Ingham,HILLARY 05/20/2011, 10:58 AM

## 2011-05-20 NOTE — Progress Notes (Signed)
Physical Therapy Note  Patient Details  Name: Jay Walters MRN: [id number] Date of Birth: 1931/09/19 Today's Date: 05/20/2011  1400 -1455 (group) Pain - no c/o of pain Pt.. Participated in gait group to improve gait safety and endurance with/without assistive device Locomotion: pt ambulates 150 feet on unit X 2 with RW close supervision with moderate instructional cues to use AD safely when turning/changing direction Other- NuStep Level 4 X 10 minutes to improve tolerance to activity/ bilateral LE strengthening  [name],JIM 05/20/2011, 3:44 PM

## 2011-05-21 LAB — PROTIME-INR
INR: 1.78 — ABNORMAL HIGH (ref 0.00–1.49)
Prothrombin Time: 21 seconds — ABNORMAL HIGH (ref 11.6–15.2)

## 2011-05-21 MED ORDER — SENNOSIDES-DOCUSATE SODIUM 8.6-50 MG PO TABS
2.0000 | ORAL_TABLET | Freq: Every day | ORAL | Status: DC
  Administered 2011-05-21 – 2011-05-24 (×4): 2 via ORAL
  Filled 2011-05-21 (×6): qty 2

## 2011-05-21 MED ORDER — WARFARIN SODIUM 5 MG PO TABS
5.0000 mg | ORAL_TABLET | Freq: Once | ORAL | Status: AC
  Administered 2011-05-21: 5 mg via ORAL
  Filled 2011-05-21: qty 1

## 2011-05-21 NOTE — Progress Notes (Signed)
PHARMACY - ANTICOAGULATION CONSULT   Pharmacy Consult for: Coumadin  Indication: Atrial flutter    Patient Data:   Allergies: No Known Allergies  Patient Measurements: Height: 5\' 9"  (175.3 cm) Weight: 147 lb 14.9 oz (67.1 kg) IBW/kg (Calculated) : 70.7  Adjusted Body Weight:   Vital Signs: Temp:  [98.5 F (36.9 C)-98.6 F (37 C)] 98.6 F (37 C) (12/09 0603) Pulse Rate:  [44-45] 44  (12/09 0603) Resp:  [18] 18  (12/09 0603) BP: (101-117)/(46-56) 117/56 mmHg (12/09 0603) SpO2:  [96 %-97 %] 96 % (12/09 0603)  Intake/Output from previous day:  Intake/Output Summary (Last 24 hours) at 05/21/11 1123 Last data filed at 05/21/11 0848  Gross per 24 hour  Intake    600 ml  Output   1226 ml  Net   -626 ml    Labs:  Basename 05/21/11 0645 05/20/11 0600 05/19/11 0635  HGB -- -- --  HCT -- -- --  PLT -- -- --  APTT -- -- --  LABPROT 21.0* 22.3* 24.1*  INR 1.78* 1.92* 2.12*  HEPARINUNFRC -- -- --  CREATININE -- -- --  CKTOTAL -- -- --  CKMB -- -- --  TROPONINI -- -- --   Estimated Creatinine Clearance: 70.7 ml/min (by C-G formula based on Cr of 0.83).  Medical History: Past Medical History  Diagnosis Date  . GERD (gastroesophageal reflux disease)   . Dysphagia     with solids  . Seizures   . Hypertension   . Hiatal hernia   . Esophageal stricture   . Atrial fibrillation   . Diverticulosis   . Osteopenia   . Hemorrhoids     Scheduled medications:     . cholecalciferol  2,000 Units Oral Daily  . ciprofloxacin  250 mg Oral BID  . doxazosin  4 mg Oral QPM  . ferrous sulfate  325 mg Oral BID WC  . finasteride  5 mg Oral QPM  . pantoprazole  40 mg Oral QPM  . phenytoin  300 mg Oral QHS  . polyethylene glycol  17 g Oral Daily  . prednisoLONE  10 mg Oral QPC breakfast  . prednisoLONE  20 mg Oral QPC breakfast  . predniSONE  30 mg Oral QPC breakfast  . predniSONE  40 mg Oral QPC breakfast  . rosuvastatin  20 mg Oral q1800  . senna-docusate  2 tablet Oral  QHS  . warfarin  3 mg Oral ONCE-1800  . DISCONTD: senna  1 tablet Oral BID     Assessment:  77 y.o. male admitted on 05/16/2011, with h/o atrial flutter. Pharmacy consulted to manage Coumadin. INR decreased today to 1.78 ( Dose not given 12/6)  Goal of Therapy:  1. INR 2-3  Plan:  1. Coumadin 5 mg po today.   2. Daily PT / INR.  3.  Consider addition lovenox while INR <2 Mckenzi Buonomo,PharmD 05/21/2011, 11:23 AM

## 2011-05-21 NOTE — Progress Notes (Signed)
Patient ID: [name], male   DOB: 10-12-1931, 77 y.o.   MRN: [id number] Patient ID: [name], male   DOB: 01-06-1932, 77 y.o.   MRN: [id number] Patient ID: [name], male   DOB: [birth year], 77 y.o.   MRN: [id number] Patient ID: [name], male   DOB: [birth year], 77 y.o.   MRN: [id number] Patient ID: [name], male   DOB: 1931/11/03, 77 y.o.   MRN: [id number] Subjective/Complaints: Review of Systems  Gastrointestinal: Positive for constipation.  Musculoskeletal: Positive for myalgias.  Neurological: Positive for tingling and sensory change.  All other systems reviewed and are negative.   Urinary frequency and dysuria persistent  Objective: Vital Signs: Blood pressure 117/56, pulse 44, temperature 98.6 F (37 C), temperature source Oral, resp. rate 18, height 5\' 9"  (1.753 m), weight 67.1 kg (147 lb 14.9 oz), SpO2 96.00%. No results found. No results found for this basename: WBC:2,HGB:2,HCT:2,PLT:2 in the last 72 hours No results found for this basename: NA:2,K:2,CL:2,CO2:2,GLUCOSE:2,BUN:2,CREATININE:2,CALCIUM:2 in the last 72 hours CBG (last 3)  No results found for this basename: GLUCAP:3 in the last 72 hours  Wt Readings from Last 3 Encounters:  05/16/11 67.1 kg (147 lb 14.9 oz)  05/14/11 67 kg (147 lb 11.3 oz)  04/18/11 61.689 kg (136 lb)    Physical Exam:  General appearance: alert and no distress Head: Normocephalic, without obvious abnormality, atraumatic Eyes: conjunctivae/corneas clear. PERRL, EOM's intact. Fundi benign. Ears: normal TM's and external ear canals both ears Nose: Nares normal. Septum midline. Mucosa normal. No drainage or sinus tenderness. Throat: lips, mucosa, and tongue normal; teeth and gums normal Neck: no adenopathy, no carotid bruit, no JVD, supple, symmetrical, trachea midline and thyroid not enlarged, symmetric, no tenderness/mass/nodules Back: symmetric, no curvature. ROM normal. No CVA tenderness. Resp: clear to auscultation  bilaterally Cardio: regular rate and rhythm GI: soft, non-tender; bowel sounds normal; no masses,  no organomegaly Extremities: extremities normal, atraumatic, no cyanosis or edema Pulses: 2+ and symmetric Skin: Skin color, texture, turgor normal. No rashes or lesions Neurologic: C4 and C5 weakness. Deltoids 3- to 3/5, Biceps 3+-4/5.  Tingling and mild sensory loss in hands and sole of left foot.  Showing improvement Incision/Wound: skin clear   Assessment/Plan: 1. Functional deficits secondary to C2 myelopathy/?transverse myelitis  which require 3+ hours per day of interdisciplinary therapy in a comprehensive inpatient rehab setting. Physiatrist is providing close team supervision and 24 hour management of active medical problems listed below. Physiatrist and rehab team continue to assess barriers to discharge/monitor patient progress toward functional and medical goals.  Anxious to go home   Mobility: Bed Mobility Bed Mobility: No Transfers Transfers: Yes Sit to Stand: 5: Supervision;With upper extremity assist Sit to Stand Details (indicate cue type and reason): vc's for hand placement Stand to Sit: With upper extremity assist;5: Supervision Stand to Sit Details: vc's for hand placement Ambulation/Gait Ambulation/Gait Assistance: 5: Supervision Ambulation/Gait Assistance Details (indicate cue type and reason): vc's for tall posture and gluteal activation Ambulation Distance (Feet): 300 Feet (>339ft) Assistive device: Rolling walker Gait Pattern: Decreased step length - right;Decreased step length - left;Trunk flexed Stairs: Yes Stairs Assistance: 4: Min assist Stair Management Technique: Alternating pattern;Two rails;One rail Left Number of Stairs: 5  Wheelchair Mobility Wheelchair Mobility: No ADL:   Cognition: Cognition Overall Cognitive Status: Appears within functional limits for tasks assessed Arousal/Alertness: Awake/alert Orientation Level: Oriented  X4 Cognition Arousal/Alertness: Awake/alert Orientation Level: Oriented X4   1.Cervical Myelopathy-IV solumedrol completed.Prednisone taper per neurology recommendations. Expect more clinical improvement  over next week or so 2. DVT Prophylaxis/Anticoagulation: Atrial flutter with history atrial septal defect with repair.IV heparin coumadin per pharm. Monitor for any bleeding episodes/ check platelets. Plan ablation in the near future as per cardiology   3.Neurogenic bowel and bladder-Remove foley and begin bladder training.Check PVR x 3. Continue proscar and cardura for now.Regulate bowel program with full education..  -E Coli UTI, sensitive to cipro.    4.. Pain Management: ultram. Monitor with increased activity especially in upper extremities.   -shoulder sx are likely due to deltoid, RTC weakness.  Advised support of arms when in bed (sleeps with HOB elevated).  Can use heat also.  Better last noc  5..History seizure disorder-Dilantin 300mg  daily. No seizure activity.   6.Hyperlipidemia-crestor   7.GERD-protonix  8. Neurogenic bowel: Moved bowels yesterday  9. Anemia: Fe supplement  Recheck This week     LOS (Days) 5 A FACE TO FACE VISIT WAS PERFORMED WITH THIS PATIENT  Christ Fullenwider T 05/21/2011, 6:54 AM

## 2011-05-21 NOTE — Progress Notes (Signed)
Patient alert and oriented x 3- patient uses call bell appropriately. Patient had another incontinent episode today. Patient reported that he thought it was only gas. Patient using urinal to void. Patient able to use rolling walker with minimum assist. Patient has decreased ROM to left arm. Patient feet dry and scaly. Patient BLE weak. Patient has some brusing to left arm. Patient continues to deni any pain. Continue with plan of care. No new complaints noted. Patient reported son to pick up clothes today to take home.

## 2011-05-21 NOTE — Progress Notes (Signed)
Physical Therapy Note  Patient Details  Name: Jay Walters MRN: [id number] Date of Birth: 12/27/1931 Today's Date: 05/21/2011  1400-1500 (60 min) Group therapy. Denies pain. Pt participated in walking group with focus on dynamic gait with RW with obstacle course negotiating turns, stepping over objects, and walking on compliant mat surface with RW. Nustep x level 5 lower extremities only for general strengthening and activity tolerance x 10 min. Dynamic balance activity to throw and catch ball without UE support, with LOB x 2 with minA to recover otherwise supervision. Gait in controlled environment < 200' for general lower extremity strengthening and functional mobility, improved posture noted, minimal cueing needed.   [name] [facility] 05/21/2011, 4:19 PM

## 2011-05-22 LAB — PROTIME-INR: Prothrombin Time: 19.8 seconds — ABNORMAL HIGH (ref 11.6–15.2)

## 2011-05-22 MED ORDER — WARFARIN SODIUM 5 MG PO TABS
5.0000 mg | ORAL_TABLET | Freq: Once | ORAL | Status: AC
  Administered 2011-05-22: 5 mg via ORAL
  Filled 2011-05-22: qty 1

## 2011-05-22 NOTE — Progress Notes (Signed)
Occupational Therapy Session Note  Patient Details  Name: Jay Walters MRN: [id number] Date of Birth: 08-16-1931  Today's Date: 05/22/2011 Time: 0830-0930 Time Calculation (min): 60 min  Precautions: Precautions Precautions: Fall Required Braces or Orthoses: No Restrictions Weight Bearing Restrictions: No  Short Term Goals = Long Term Goals   Skilled Therapeutic Interventions/Progress Updates:    Treatment emphasis on ADL retraining at shower level in room bathroom. Focused skilled intervention on functional mobility around room using rolling walker (patient gathering own clothes and materials for shower), UB/LB bathing in sit to stand position using 3-in-1, UB/LB dressing edge of bed in sit to stand position, dynamic standing balance/endurance at sink for grooming tasks. Also engaged in therapeutic activity in therapy gym focusing on range of motion & functional use of bilateral UEs and dynamic standing balance/tolerance/endurance. Patient continues to flex at hips during activities & ambulation; minimal verbal cues to correct flexed posture. Overall, patient completing ADLs at supervision level.   Pain  No complaints of pain; patient did complain of some discomfort/soreness in left shoulder. RN aware  Therapy/Group: Individual Therapy  Leonette Tischer 05/22/2011, 9:55 AM

## 2011-05-22 NOTE — Progress Notes (Signed)
Physical Therapy Note  Patient Details  Name: Jay Walters MRN: [id number] Date of Birth: [birth year] Today's Date: 05/22/2011  Individual therapy 1430-1500 Denies pain. Pt performed toileting in standing position with overall close S, cues for safety. Gait training with RW supervision >150' and worked on gait without AD with overall minA for increase challenge to balance. Nustep on level 5 x and level 6 x 4 min for general lower extremity strengthening and activity tolerance. Stair training for household mobility with 1 rail with supervision overall, no LOB and reciprocal pattern.   [name] [facility] 05/22/2011, 3:23 PM

## 2011-05-22 NOTE — Progress Notes (Signed)
Physical Therapy Note  Patient Details  Name: Jay Walters MRN: [id number] Date of Birth: 1932-03-20 Today's Date: 05/22/2011  Individual therapy [id number] Denies pain -"a little sore in my shoulder from yesterday." Balance appears to be decreased today, pt also reporting he noticed this. LOB to R on multiple occasions when balance challenged in standing or with gait. Focused on strength training in hips for step ups onto step with minA, decreased motor control noted on RLE with descending step down x 1 LOB posterior with step down. Dynamic gait training with RW for quick starts and stops, head turns, and variations of speed close S/Steady A. Biodex for limits of stability and catch game to work on dynamic balance, weightshifting, and balance reactions. Gait back to pt room and through dayroom with S with RW.  Tedd Sias 05/22/2011, 10:58 AM

## 2011-05-22 NOTE — Progress Notes (Signed)
Patient ID: Jay Walters, male   DOB: 08-10-1931, 77 y.o.   MRN: [id number] Patient ID: Jay Walters, male   DOB: 07-Aug-1931, 76 y.o.   MRN: [id number] Patient ID: Jay Walters, male   DOB: 1931/08/06, 77 y.o.   MRN: [id number] Patient ID: Jay Walters, male   DOB: [birth year], 77 y.o.   MRN: [id number] Patient ID: Jay Walters, male   DOB: Jul 29, 1931, 77 y.o.   MRN: [id number] Patient ID: Jay Walters, male   DOB: [birth year], 77 y.o.   MRN: [id number] Subjective/Complaints: Review of Systems  Gastrointestinal: Positive for constipation.  Musculoskeletal: Positive for myalgias.  Neurological: Positive for tingling and sensory change.  All other systems reviewed and are negative.   Urinary frequency a little better  Objective: Vital Signs: Blood pressure 104/62, pulse 41, temperature 99 F (37.2 C), temperature source Oral, resp. rate 16, height 5\' 9"  (1.753 m), weight 67.1 kg (147 lb 14.9 oz), SpO2 96.00%. No results found. No results found for this basename: WBC:2,HGB:2,HCT:2,PLT:2 in the last 72 hours No results found for this basename: NA:2,K:2,CL:2,CO2:2,GLUCOSE:2,BUN:2,CREATININE:2,CALCIUM:2 in the last 72 hours CBG (last 3)  No results found for this basename: GLUCAP:3 in the last 72 hours  Wt Readings from Last 3 Encounters:  05/16/11 67.1 kg (147 lb 14.9 oz)  05/14/11 67 kg (147 lb 11.3 oz)  04/18/11 61.689 kg (136 lb)    Physical Exam:  General appearance: alert and no distress Head: Normocephalic, without obvious abnormality, atraumatic Eyes: conjunctivae/corneas clear. PERRL, EOM's intact. Fundi benign. Ears: normal TM's and external ear canals both ears Nose: Nares normal. Septum midline. Mucosa normal. No drainage or sinus tenderness. Throat: lips, mucosa, and tongue normal; teeth and gums normal Neck: no adenopathy, no carotid bruit, no JVD, supple, symmetrical, trachea midline and thyroid not enlarged, symmetric, no tenderness/mass/nodules Back: symmetric, no  curvature. ROM normal. No CVA tenderness. Resp: clear to auscultation bilaterally Cardio: regular rate and rhythm GI: soft, non-tender; bowel sounds normal; no masses,  no organomegaly Extremities: extremities normal, atraumatic, no cyanosis or edema Pulses: 2+ and symmetric Skin: Skin color, texture, turgor normal. No rashes or lesions Neurologic: C4 and C5 weakness. Deltoids 3+/5, Biceps 3+-4/5.  Tingling and mild sensory loss in hands and sole of left foot.  Showing improvement Incision/Wound: skin clear   Assessment/Plan: 1. Functional deficits secondary to C2 myelopathy/?transverse myelitis  which require 3+ hours per day of interdisciplinary therapy in a comprehensive inpatient rehab setting. Physiatrist is providing close team supervision and 24 hour management of active medical problems listed below. Physiatrist and rehab team continue to assess barriers to discharge/monitor patient progress toward functional and medical goals.  Anxious to go home   Mobility: Bed Mobility Bed Mobility: No Transfers Transfers: Yes Sit to Stand: 5: Supervision;With upper extremity assist Sit to Stand Details (indicate cue type and reason): vc's for hand placement Stand to Sit: With upper extremity assist;5: Supervision Stand to Sit Details: vc's for hand placement Ambulation/Gait Ambulation/Gait Assistance: 5: Supervision Ambulation/Gait Assistance Details (indicate cue type and reason): vc's for tall posture and gluteal activation Ambulation Distance (Feet): 300 Feet (>379ft) Assistive device: Rolling walker Gait Pattern: Decreased step length - right;Decreased step length - left;Trunk flexed Stairs: Yes Stairs Assistance: 4: Min assist Stair Management Technique: Alternating pattern;Two rails;One rail Left Number of Stairs: 5  Wheelchair Mobility Wheelchair Mobility: No ADL:   Cognition: Cognition Overall Cognitive Status: Appears within functional limits for tasks  assessed Arousal/Alertness: Awake/alert Orientation Level: Oriented X4 Cognition Arousal/Alertness: Awake/alert Orientation Level: Oriented X4  1.Cervical Myelopathy-IV solumedrol completed.Prednisone taper per neurology recommendations. Expect more clinical improvement over next week or so 2. DVT Prophylaxis/Anticoagulation: Atrial flutter with history atrial septal defect with repair.IV heparin coumadin per pharm. Monitor for any bleeding episodes/ check platelets. Plan ablation in the near future as per cardiology   3.Neurogenic bowel and bladder-. Continue proscar and cardura  -E Coli UTI, sensitive to cipro.   -Timed voids. oob to void.   4.. Pain Management: ultram. Monitor with increased activity especially in upper extremities.   -shoulder sx are likely due to deltoid, RTC weakness.  Advised support of arms when in bed (sleeps with HOB elevated).  Can use heat also.  Better last noc  5..History seizure disorder-Dilantin 300mg  daily. No seizure activity.   6.Hyperlipidemia-crestor   7.GERD-protonix  8. Neurogenic bowel: Moved bowels yesterday  9. Anemia: Fe supplement  Recheck This week     LOS (Days) 6 A FACE TO FACE VISIT WAS PERFORMED WITH THIS PATIENT  , T 05/22/2011, 7:20 AM

## 2011-05-22 NOTE — Progress Notes (Signed)
Occupational Therapy Session Note  Patient Details  Name: Kathan Kirker MRN: [id number] Date of Birth: [birth year]  Today's Date: 05/22/2011 Time: [id number] Time Calculation (min): 45 min  Precautions: Precautions Precautions: Fall Required Braces or Orthoses: No Restrictions Weight Bearing Restrictions: No  Short Term Goals = Long Term Goals   Skilled Therapeutic Interventions/Progress Updates:    Focused skilled intervention on simulated walk-in shower transfer onto shower seat with back. Recommend patient use shower seat with back, have grab bars for support prn and install hand held shower. Also focused on stretching, range of motion, and strengthening exercises to bilateral UEs. Gave patient handout of passive stretches for bilateral shoulders. Encouraged patient's son to be present for education regarding exercises for when patient discharges home. Educated and demonstrated stretches. Also educated patient on w/c pushups for triceps and bicep curls using theraband. Patient determined to work hard and be as independent as possible.   Pain Pain Assessment Pain Assessment: No/denies pain  Therapy/Group: Individual Therapy  Julyan Gales 05/22/2011, 1:57 PM

## 2011-05-22 NOTE — Progress Notes (Signed)
Patient  Tolerating therapy. Generalized weakness ambulates with min assist. Urgency with urinating continent at this time.   Patient continues to report tingling in lt leg . No complaint of pain . Continue with plan of care.                                                                                                                                            Jay Walters

## 2011-05-22 NOTE — Progress Notes (Signed)
ANTICOAGULATION CONSULT NOTE - Follow Up Consult  Pharmacy Consult:  Coumadin Indication:  Aflutter  No Known Allergies  Patient Measurements: Height: 5\' 9"  (175.3 cm) Weight: 147 lb 14.9 oz (67.1 kg) IBW/kg (Calculated) : 70.7   Vital Signs: Temp: 99 F (37.2 C) (12/10 0615) Temp src: Oral (12/10 0615) BP: 104/62 mmHg (12/10 0615) Pulse Rate: 41  (12/10 0615)  Labs:  Basename 05/22/11 0500 05/21/11 0645 05/20/11 0600  HGB -- -- --  HCT -- -- --  PLT -- -- --  APTT -- -- --  LABPROT 19.8* 21.0* 22.3*  INR 1.65* 1.78* 1.92*  HEPARINUNFRC -- -- --  CREATININE -- -- --  CKTOTAL -- -- --  CKMB -- -- --  TROPONINI -- -- --   Estimated Creatinine Clearance: 70.7 ml/min (by C-G formula based on Cr of 0.83).   Assessment: 79 YOM on Coumadin for Aflutter.  Noted plan for ablation in the future.  INR subtherapeutic today.  Goal of Therapy:  INR 2-3   Plan:  1.  Coumadin 5mg  PO today 2.  Daily PT/INR 3.  On day #5 of Cipro, f/u LOT.   Jay Walters 05/22/2011,1:12 PM

## 2011-05-23 LAB — CBC
HCT: 35.5 % — ABNORMAL LOW (ref 39.0–52.0)
MCH: 30.2 pg (ref 26.0–34.0)
MCHC: 33 g/dL (ref 30.0–36.0)
RDW: 14.8 % (ref 11.5–15.5)

## 2011-05-23 LAB — PROTIME-INR: INR: 1.8 — ABNORMAL HIGH (ref 0.00–1.49)

## 2011-05-23 MED ORDER — WARFARIN SODIUM 4 MG PO TABS
4.0000 mg | ORAL_TABLET | Freq: Once | ORAL | Status: AC
  Administered 2011-05-23: 4 mg via ORAL
  Filled 2011-05-23: qty 1

## 2011-05-23 MED ORDER — ENOXAPARIN SODIUM 30 MG/0.3ML ~~LOC~~ SOLN
30.0000 mg | Freq: Two times a day (BID) | SUBCUTANEOUS | Status: DC
  Administered 2011-05-23 – 2011-05-25 (×5): 30 mg via SUBCUTANEOUS
  Filled 2011-05-23 (×7): qty 0.3

## 2011-05-23 NOTE — Progress Notes (Signed)
Subjective/Complaints: Review of Systems  Gastrointestinal: Positive for constipation.  Musculoskeletal: Positive for myalgias.  Neurological: Positive for tingling and sensory change.  All other systems reviewed and are negative.   Urinary frequency a little better.  Emptied bowels yesterday.  Objective: Vital Signs: Blood pressure 93/53, pulse 45, temperature 99.3 F (37.4 C), temperature source Oral, resp. rate 20, height 5\' 9"  (1.753 m), weight 67.1 kg (147 lb 14.9 oz), SpO2 97.00%. No results found. No results found for this basename: WBC:2,HGB:2,HCT:2,PLT:2 in the last 72 hours No results found for this basename: NA:2,K:2,CL:2,CO2:2,GLUCOSE:2,BUN:2,CREATININE:2,CALCIUM:2 in the last 72 hours CBG (last 3)  No results found for this basename: GLUCAP:3 in the last 72 hours  Wt Readings from Last 3 Encounters:  05/16/11 67.1 kg (147 lb 14.9 oz)  05/14/11 67 kg (147 lb 11.3 oz)  04/18/11 61.689 kg (136 lb)    Physical Exam:  General appearance: alert and no distress Head: Normocephalic, without obvious abnormality, atraumatic Eyes: conjunctivae/corneas clear. PERRL, EOM's intact. Fundi benign. Ears: normal TM's and external ear canals both ears Nose: Nares normal. Septum midline. Mucosa normal. No drainage or sinus tenderness. Throat: lips, mucosa, and tongue normal; teeth and gums normal Neck: no adenopathy, no carotid bruit, no JVD, supple, symmetrical, trachea midline and thyroid not enlarged, symmetric, no tenderness/mass/nodules Back: symmetric, no curvature. ROM normal. No CVA tenderness. Resp: clear to auscultation bilaterally Cardio: regular rate and rhythm GI: soft, non-tender; bowel sounds normal; no masses,  no organomegaly Extremities: extremities normal, atraumatic, no cyanosis or edema Pulses: 2+ and symmetric Skin: Skin color, texture, turgor normal. No rashes or lesions Neurologic: C4 and C5 weakness. Deltoids 3+/5, Biceps 3+-4/5.  Tingling and mild sensory  loss in hands and sole of left foot.  Showing improvement Incision/Wound: skin clear   Assessment/Plan: 1. Functional deficits secondary to C2 myelopathy/?transverse myelitis  which require 3+ hours per day of interdisciplinary therapy in a comprehensive inpatient rehab setting. Physiatrist is providing close team supervision and 24 hour management of active medical problems listed below. Physiatrist and rehab team continue to assess barriers to discharge/monitor patient progress toward functional and medical goals.  Anxious to go home   Mobility: Bed Mobility Bed Mobility: No Transfers Transfers: Yes Sit to Stand: 5: Supervision;With upper extremity assist Sit to Stand Details (indicate cue type and reason): vc's for hand placement Stand to Sit: With upper extremity assist;5: Supervision Stand to Sit Details: vc's for hand placement Ambulation/Gait Ambulation/Gait Assistance: 5: Supervision;4: Min assist Ambulation/Gait Assistance Details (indicate cue type and reason): vc's for tall posture and gluteal activation Ambulation Distance (Feet): 300 Feet (>327ft) Assistive device: Rolling walker Gait Pattern: Decreased step length - right;Decreased step length - left;Trunk flexed Stairs: Yes Stairs Assistance: 4: Min assist Stair Management Technique: Alternating pattern;Two rails;One rail Left Number of Stairs: 5  Wheelchair Mobility Wheelchair Mobility: No ADL:   Cognition: Cognition Overall Cognitive Status: Appears within functional limits for tasks assessed Arousal/Alertness: Awake/alert Orientation Level: Oriented X4 Cognition Arousal/Alertness: Awake/alert Orientation Level: Oriented X4   1.Cervical Myelopathy-IV solumedrol completed.Prednisone taper per neurology recommendations. Expect more clinical improvement over next week or so 2. DVT Prophylaxis/Anticoagulation: Atrial flutter with history atrial septal defect with repair.IV heparin coumadin per pharm. Monitor  for any bleeding episodes/ check platelets. Plan ablation in the near future as per cardiology   3.Neurogenic bowel and bladder-. Continue proscar and cardura  -E Coli UTI, sensitive to cipro.   -Timed voids. oob to void. Showing improvement   4.. Pain Management: ultram. Monitor with  increased activity especially in upper extremities.   -shoulder sx are likely due to deltoid, RTC weakness.  Advised support of arms when in bed (sleeps with HOB elevated).  Can use heat also.  Improving. Should resolve completely with better motor control of shoulder.  5..History seizure disorder-Dilantin 300mg  daily. No seizure activity.   6.Hyperlipidemia-crestor   7.GERD-protonix  8. Neurogenic bowel: Moved bowels yesterday  9. Anemia: Fe supplement  Recheck This week  10. A-flutter:  Has been bradycardic.  Persistent today.  Check EKG.  Consider cards follow up     LOS (Days) 7 A FACE TO FACE VISIT WAS PERFORMED WITH THIS PATIENT  Jay Walters 05/23/2011, 6:58 AM

## 2011-05-23 NOTE — Progress Notes (Signed)
ANTICOAGULATION CONSULT NOTE - Follow Up Consult  Pharmacy Consult:  Coumadin Indication:  Aflutter  No Known Allergies  Patient Measurements: Height: 5\' 9"  (175.3 cm) Weight: 147 lb 14.9 oz (67.1 kg) IBW/kg (Calculated) : 70.7   Vital Signs: Temp: 99.3 F (37.4 C) (12/11 0500) Temp src: Oral (12/11 0500) BP: 93/53 mmHg (12/11 0500) Pulse Rate: 45  (12/11 0500)  Labs:  Basename 05/23/11 0645 05/22/11 0500 05/21/11 0645  HGB 11.7* -- --  HCT 35.5* -- --  PLT 163 -- --  APTT -- -- --  LABPROT 21.2* 19.8* 21.0*  INR 1.80* 1.65* 1.78*  HEPARINUNFRC -- -- --  CREATININE -- -- --  CKTOTAL -- -- --  CKMB -- -- --  TROPONINI -- -- --   Estimated Creatinine Clearance: 70.7 ml/min (by C-G formula based on Cr of 0.83).   Assessment: Jay Walters on Coumadin for Aflutter.  Noted plan for ablation in the future.  INR subtherapeutic but is trending up.  Noted Lovenox added as bridge.  Goal of Therapy:  INR 2-3   Plan:  1.  Coumadin 4mg  PO today 2.  Daily PT/INR 3.  Continue Lovenox 30mg  SQ Q12H 4.  On day #6 of Cipro, f/u LOT.   Phillips Climes Dien 05/23/2011,11:16 AM

## 2011-05-23 NOTE — Progress Notes (Signed)
Occupational Therapy Session Note  Patient Details  Name: Jay Walters MRN: [id number] Date of Birth: [birth year]  Today's Date: 05/23/2011 Time: 4098-1191 Time Calculation (min): 45 min  Precautions: Precautions Precautions: Fall Required Braces or Orthoses: No Restrictions Weight Bearing Restrictions: No   Skilled Therapeutic Interventions/Progress Updates:    ADL retraining including bathing at shower level in room and dressing with sit to stand from chair.  Pt amb with r/w in room to gather clothing and amb to BR for shower.  Pt completed bathing with sit to stand from 3 in 1.  Pt completed grooming while standing at sink.  Pt ambulated with r/w to gym to complete BUE exercises to increase BUE strength.  Focus on activity tolerance, standing balance, safety awareness, and UE exercises.     Pain Pain Assessment Pain Assessment: No/denies pain  Therapy/Group: Individual Therapy  Rich Brave 05/23/2011, 9:42 AM

## 2011-05-23 NOTE — Progress Notes (Signed)
Physical Therapy Session Note  Patient Details  Name: Jay Walters MRN: [id number] Date of Birth: Apr 22, 1932  Today's Date: 05/23/2011 Time:0920- 1013 Time Calculation (min): 53 min  Precautions: Precautions Precautions: Fall Required Braces or Orthoses: No Restrictions Weight Bearing Restrictions: No  Short Term Goals: PT Short Term Goal 1: =LTGS  Skilled Therapeutic Interventions: gait training without AD x 130', x 155', focusing on fluidity of movement, cadence, upright posture and forward gaze. Trunk strengthening in sitting for postural responses- facilitating trunk shortening/lengthening with manual cues, visual cues.  Trunk flexibility is limited by scoliosis.  High level balance activities including ball play in standing, retrieving and transporting objects from the floor, with supervision.   Alternate foot tapping onto 6" high stool, braiding,  standing and marching on dynamic surface, with min A needed for occasional mild LOB.  Ascended/descended 15 steps (gym steps x 3) with 1 rail, safely with S, alternating feet.   Pt left in room sitting in recliner with call bell and phone within reach.       General Chart Reviewed: Yes  Pain Assessment Pain Assessment: No/denies pain     Therapy/Group: Individual Therapy  Barbi Kumagai 05/23/2011, 4:41 PM

## 2011-05-23 NOTE — Progress Notes (Signed)
Occupational Therapy Session Note  Patient Details  Name: Jay Walters MRN: [id number] Date of Birth: 1931/10/15  Today's Date: 05/23/2011 Time: 1300-1330 Time Calculation (min): 30 min  Precautions: Precautions Precautions: Fall Required Braces or Orthoses: No Restrictions Weight Bearing Restrictions: No  Short Term Goals = Long Term Goals   Skilled Therapeutic Interventions/Progress Updates:    Therapeutic activity focusing on simple home management task in ADL apartment without use of assistive device. Patient completed task with supervision.   Pain  No complaints of pain  Therapy/Group: Individual Therapy  Americo Vallery 05/23/2011, 1:59 PM

## 2011-05-23 NOTE — Progress Notes (Signed)
Occupational Therapy Session Note  Patient Details  Name: Vinayak Bobier MRN: [id number] Date of Birth: 1932-04-06  Today's Date: 05/23/2011 Time: 1030-1130 Time Calculation (min): 60 min  Precautions: Precautions Precautions: Fall Required Braces or Orthoses: No Restrictions Weight Bearing Restrictions: No  Goals Short Term Goals = Long Term Goals Goals changed from an overall Modified Independent level to an overall Supervision level secondary to patients poor balance during functional tasks. There have been multiple occasions where patient has tripped and come close to falling while using rolling walker; not usually during long distance ambulation, but during functional tasks with rolling walker (walker backwards, walking sideways, in tight spaces, etc). SW notified.    Skilled Therapeutic Interventions/Progress Updates:    Therapeutic activity focusing on dynamic standing balance/tolerance/endurance, increasing overall activity tolerance/endurance, safety with rolling walker on various surfaces. Patient requires supervision/minimal verbal cues to stay within rolling walker and to keep walker close during all ambulation and all transfers. Also discussed home exercise program with patient; maximum verbal cues for reminders on exercises/stretches. Wrote on provided handout to increase independence with exercises. Again, encouraged patient to have son come in to go over passive exercises and stretches & for family to come in for education regarding supervision goals and safety for a safe discharge home.   Pain Pain Assessment Pain Assessment: No/denies pain Pain Score: 0-No pain  Therapy/Group: Individual Therapy  Aarion Kittrell 05/23/2011, 11:43 AM

## 2011-05-23 NOTE — Progress Notes (Signed)
Patient is alert and oriented x3. He has denied any pain or discomfort this shift. Patient is continent of both bowel and bladder. Continue current plan of care.

## 2011-05-23 NOTE — Progress Notes (Signed)
Social Work Patient ID: Jay Walters, male   DOB: Jul 04, 1931, 77 y.o.   MRN: [id number]   Met with pt and spoke to wife via telephone to discuss team conference-goals supervision level and discharge date 05/25/2011. Both pleased with how well he is doing.  Wife reports between she and son  Someone will be with him the next three weeks.  Agreeable to OP Rehab will make referral and also make referral for rolling walker, 3in1 and tub seat.  Pt is pleased with the goals and will be ready Thursday.

## 2011-05-24 ENCOUNTER — Inpatient Hospital Stay (HOSPITAL_COMMUNITY): Admission: RE | Admit: 2011-05-24 | Payer: 59 | Source: Ambulatory Visit | Admitting: Orthopedic Surgery

## 2011-05-24 ENCOUNTER — Encounter (HOSPITAL_COMMUNITY): Admission: RE | Payer: Self-pay | Source: Ambulatory Visit

## 2011-05-24 LAB — PROTIME-INR: INR: 1.45 (ref 0.00–1.49)

## 2011-05-24 SURGERY — ARTHROPLASTY, KNEE, TOTAL
Anesthesia: General | Laterality: Right

## 2011-05-24 MED ORDER — WARFARIN SODIUM 6 MG PO TABS
6.0000 mg | ORAL_TABLET | Freq: Once | ORAL | Status: AC
  Administered 2011-05-24: 6 mg via ORAL
  Filled 2011-05-24: qty 1

## 2011-05-24 NOTE — Progress Notes (Signed)
Therapeutic Recreation Discharge Summary Patient Details  Name: Ejay Lashley MRN: [id number] Date of Birth: [birth year]  Long term goals set: 2  Long term goals met: 2  Comments on progress toward goals: Pt has made excellent progress toward goals and met 2/2.  Pt is Mod I for TR tasks given extra time and use of RW.  Pt supervision level for community reintegration tasks.  Pt is extremely motivated to regain Independence.  Pt ready for d/c home tomorrow.  Reasons goals not met: n/a   Equipment acquired: n/a  Reasons for discharge: discharge from hospital  Patient/family agrees with progress made and goals achieved: Yes  Gladys Gutman 05/24/2011, 5:11 PM

## 2011-05-24 NOTE — Progress Notes (Signed)
Patient is alert and oriented x3. He has denied any pain or discomfort this shift. Patient has been continent of bowel and bladder this shift. Continue current plan of care.

## 2011-05-24 NOTE — Progress Notes (Signed)
ANTICOAGULATION CONSULT NOTE - Follow Up  Pharmacy Consult:  Coumadin Indication:  Aflutter  No Known Allergies  Patient Measurements: Height: 5\' 9"  (175.3 cm) Weight: 126 lb 3.2 oz (57.244 kg) IBW/kg (Calculated) : 70.7   Vital Signs: Temp: 98.7 F (37.1 C) (12/12 0530) Temp src: Oral (12/12 0530) BP: 99/60 mmHg (12/12 0530) Pulse Rate: 52  (12/12 0530)  Labs:  Basename 05/24/11 0618 05/23/11 0645 05/22/11 0500  HGB -- 11.7* --  HCT -- 35.5* --  PLT -- 163 --  APTT -- -- --  LABPROT 17.9* 21.2* 19.8*  INR 1.45 1.80* 1.65*  HEPARINUNFRC -- -- --  CREATININE -- -- --  CKTOTAL -- -- --  CKMB -- -- --  TROPONINI -- -- --   Estimated Creatinine Clearance: 60.3 ml/min (by C-G formula based on Cr of 0.83).   Assessment: 79 YOM on Coumadin and Lovenox bridge for Aflutter.  Noted plan for ablation in the future.  INR decreased today.  Noted d/c planning  Goal of Therapy:  INR 2-3   Plan:  1.  Coumadin 6mg  PO today 2.  Daily PT/INR 3.  Continue Lovenox 30mg  SQ Q12H 4.  On day #7 of Cipro, f/u LOT.   Phillips Climes Dien 05/24/2011,12:27 PM

## 2011-05-24 NOTE — Progress Notes (Signed)
Occupational Therapy Session Note  Patient Details  Name: Braylon Grenda MRN: [id number] Date of Birth: Mar 15, 1932  Today's Date: 05/24/2011 Time: 9562-1308 Time Calculation (min): 45 min  Precautions: Precautions Precautions: Fall Required Braces or Orthoses: No Restrictions Weight Bearing Restrictions: No  Skilled Intervention/Session Note   ADL retraining including bathing at shower level and dressing EOB with sit to stand.  Pt supervision for shower transfer and bathing, independent for UB dressing and mod I for LB dressing, supervision for toilet transfers and toileting.  Pt continues to require min verbal cues for safety awareness when ambulating with r/w for transfers.  Focus on activity tolerance and safety awareness.  Pain Pain Assessment Pain Assessment: No/denies pain  Therapy/Group: Individual Therapy  Rich Brave 05/24/2011, 11:41 AM

## 2011-05-24 NOTE — Progress Notes (Signed)
Physical Therapy Note  Patient Details  Name: Jay Walters MRN: [id number] Date of Birth: 03/02/1932 Today's Date: 05/24/2011  14:15- 16:00  Individual therapy,  Pt denies pain.  Pt. Participated on an outting to [employer] in the community to experience gait and balance with a SPC, learn techniques to improve energy conservation and how to manage barriers in the community.  Pt. Ambulated on uneven surfaces , curb ,van steps, all supervision.  Pt. States he prefers to use the RW at discharge verses the cane for now.  See outting sheet in paper chart for goals.   Julian Reil 05/24/2011, 4:19 PM

## 2011-05-24 NOTE — Progress Notes (Addendum)
Patient Details  Name: Jay Walters MRN: [id number] Date of Birth: 1932/03/18  Today's Date: 05/24/2011 Time: 14:30-1600 Time Calculation (min): 105 min  Skilled Therapeutic Interventions/Progress Updates:  Pt. Participated on an outing to [employer] in the community to experience gait and balance with a SPC, learn techniques to improve energy conservation and how to manage barriers in the community. Pt. Ambulated on uneven surfaces , curb ,van steps, all supervision level. Pt. States he prefers to use the RW at discharge verses the cane for now. See outing sheet in paper chart for goals.   No c/o pain.  Pt is Mod I with TR tasks given extra time to complete tasks.  Pt is able to state safety concerns and identify options for safe completion.   Therapy/Group: Community Reintegration  Activity Level: Moderate:  Level of assist: Supervision  Jay Walters 05/24/2011, 4:43 PM

## 2011-05-24 NOTE — Progress Notes (Signed)
Occupational Therapy Discharge Summary & Session Note  Patient Details  Name: Sirron Francesconi MRN: [id number] Date of Birth: 1932-01-10 Today's Date: 05/24/2011  Session Note 1300-1355 - 55 Minutes Individual Therapy No complaints of pain Completed the BERG balance test for overall static and dynamic balance results; see results in discharge navigator. Also engaged in therapeutic activity focusing on dynamic standing balance/tolerance/endurance without use of assistive device and functional use of bilateral upper extremities.   Patient has met 9 of 9 long term goals due to improved activity tolerance, improved balance, ability to compensate for deficits, functional use of  RIGHT upper and LEFT upper extremity and improved coordination.  No family has been present for family education. Patient is performing at an overall supervision level for aspects of daily living and for IADLs. Per social workers report, family has agreed to supervision goals and 24/7 supervision will be provided when patient discharges home.   See FIM and Discharge Navigator for objective and subjective measures.   Recommendation:  Patient will continue to received OT services in an outpatient setting to continue to advance functional skills in the area of iADL and functional use of bilateral upper extremities.  Equipment: Equipment provided: shower seat with back and [facility]  Patient/family agrees with progress made and goals achieved: Yes  Mikale Silversmith 05/24/2011, 2:49 PM

## 2011-05-24 NOTE — Progress Notes (Signed)
Patient ID: Jay Walters, male   DOB: 06/24/1931, 77 y.o.   MRN: [id number] Subjective/Complaints: Review of Systems  Gastrointestinal: Positive for constipation.  Musculoskeletal: Positive for myalgias.  Neurological: Positive for tingling and sensory change.  All other systems reviewed and are negative.   Urinary frequency a little better.  Emptied bowels yesterday.  Objective: Vital Signs: Blood pressure 99/60, pulse 52, temperature 98.7 F (37.1 C), temperature source Oral, resp. rate 20, height 5\' 9"  (1.753 m), weight 67.1 kg (147 lb 14.9 oz), SpO2 96.00%. No results found.  Basename 05/23/11 0645  WBC 5.7  HGB 11.7*  HCT 35.5*  PLT 163   No results found for this basename: NA:2,K:2,CL:2,CO2:2,GLUCOSE:2,BUN:2,CREATININE:2,CALCIUM:2 in the last 72 hours CBG (last 3)  No results found for this basename: GLUCAP:3 in the last 72 hours  Wt Readings from Last 3 Encounters:  05/16/11 67.1 kg (147 lb 14.9 oz)  05/14/11 67 kg (147 lb 11.3 oz)  04/18/11 61.689 kg (136 lb)    Physical Exam:  General appearance: alert and no distress Head: Normocephalic, without obvious abnormality, atraumatic Eyes: conjunctivae/corneas clear. PERRL, EOM's intact. Fundi benign. Ears: normal TM's and external ear canals both ears Nose: Nares normal. Septum midline. Mucosa normal. No drainage or sinus tenderness. Throat: lips, mucosa, and tongue normal; teeth and gums normal Neck: no adenopathy, no carotid bruit, no JVD, supple, symmetrical, trachea midline and thyroid not enlarged, symmetric, no tenderness/mass/nodules Back: symmetric, no curvature. ROM normal. No CVA tenderness. Resp: clear to auscultation bilaterally Cardio: regular rate and rhythm GI: soft, non-tender; bowel sounds normal; no masses,  no organomegaly Extremities: extremities normal, atraumatic, no cyanosis or edema Pulses: 2+ and symmetric Skin: Skin color, texture, turgor normal. No rashes or lesions Neurologic: C4 and C5  weakness. Deltoids 3+/5, Biceps 3+-4/5.  Tingling and mild sensory loss in hands and sole of left foot.  Showing improvement Incision/Wound: skin clear   Assessment/Plan: 1. Functional deficits secondary to C2 myelopathy/?transverse myelitis  which require 3+ hours per day of interdisciplinary therapy in a comprehensive inpatient rehab setting. Physiatrist is providing close team supervision and 24 hour management of active medical problems listed below. Physiatrist and rehab team continue to assess barriers to discharge/monitor patient progress toward functional and medical goals.  Anxious to go home   Mobility: Bed Mobility Bed Mobility: No Transfers Transfers: Yes Sit to Stand: 5: Supervision;With upper extremity assist Sit to Stand Details (indicate cue type and reason): vc's for hand placement Stand to Sit: With upper extremity assist;5: Supervision Stand to Sit Details: vc's for hand placement Ambulation/Gait Ambulation/Gait Assistance: 5: Supervision Ambulation/Gait Assistance Details (indicate cue type and reason): vc's for tall posture and gluteal activation Ambulation Distance (Feet): 300 Feet (>382ft) Assistive device: Rolling walker Gait Pattern: Decreased step length - right;Decreased step length - left;Trunk flexed Stairs: Yes Stairs Assistance: 4: Min assist Stair Management Technique: Alternating pattern;Two rails;One rail Left Number of Stairs: 5  Wheelchair Mobility Wheelchair Mobility: No ADL:   Cognition: Cognition Overall Cognitive Status: Appears within functional limits for tasks assessed Arousal/Alertness: Awake/alert Orientation Level: Oriented X4 Cognition Arousal/Alertness: Awake/alert Orientation Level: Oriented X4   1.Cervical Myelopathy-IV solumedrol completed.Prednisone taper per neurology recommendations. Expect more clinical improvement over next week or so 2. DVT Prophylaxis/Anticoagulation: Atrial flutter with history atrial septal  defect with repair. coumadin per pharm with lovenox cross coverage as INR dropped below 2.0. Monitor for any bleeding episodes/ check platelets. Plan ablation in the near future as per cardiology   3.Neurogenic bowel and bladder-. Continue  proscar and cardura  -E Coli UTI, sensitive to cipro.   -Timed voids. oob to void. Improved   4.. Pain Management: ultram. Monitor with increased activity especially in upper extremities.   -shoulder sx are likely due to deltoid, RTC weakness.  Advised support of arms when in bed (sleeps with HOB elevated).  Can use heat also.  Improving. Should resolve completely with better motor control of shoulder.  5..History seizure disorder-Dilantin 300mg  daily. No seizure activity.   6.Hyperlipidemia-crestor   7.GERD-protonix  8. Neurogenic bowel: Moved bowels yesterday  9. Anemia: Fe supplement  Recheck This week  10. A-flutter:  Has been bradycardic.  Persistent today.  Check EKG.  Consider cards follow up     LOS (Days) 8 A FACE TO FACE EVAL WAS PERFORMED WITH THIS PATIENT  SWARTZ,ZACHARY T 05/24/2011, 7:54 AM

## 2011-05-24 NOTE — Discharge Summary (Signed)
NAMETARREN, VELARDI               ACCOUNT NO.:  [id number]  MEDICAL RECORD NO.:  [id number]  LOCATION:  4004                         FACILITY:  [facility]  PHYSICIAN:  Ranelle Oyster, M.D.DATE OF BIRTH:  Nov 18, 1931  DATE OF ADMISSION:  05/16/2011 DATE OF DISCHARGE:  05/25/2011                              DISCHARGE SUMMARY   DISCHARGE DIAGNOSES: 1. Cervical myelopathy, questionable transverse myelitis. 2. Atrial flutter with Coumadin therapy. 3. Neurogenic bowel and bladder. 4. History of seizure disorder. 5. Pain management. 6. Hyperlipidemia. 7. Gastroesophageal reflux disease.  HISTORY:  This is a 77 year old right-handed male with history of atrial fibrillation as well as atrial septal defect with repair.  The patient also has a remote history of head trauma, subsequent seizure disorder. Admitted November 16 with right leg weakness and stumbling gait since October 2012.  Also, occasional incontinence of bladder.  Symptoms have since gradually worsened since November 22.  MRI of the cervical spine showed progression of right-sided cervical C2-C5 hyperintensity within the spinal cord.  Placed on intravenous Solu-Medrol for suspect transverse myelitis versus possible spinal cord infarction.  MRI of the brain was negative for any acute abnormalities.  Followup Cardiology Services for asymptomatic atrial flutter.  Placed on heparin and Coumadin therapy.  There was a plan for ablation in the near future.  CT angiogram of the neck showed thrombus and/or atherosclerotic plaque, carotid bifurcation bilaterally with less than 50% diameter stenosis of the proximal internal carotid artery bilaterally.  Physical therapy evaluation on November 30, he was total assist to ambulate 50 feet.  He was admitted for comprehensive rehab program.  PAST MEDICAL HISTORY:  See discharge diagnoses.  ALLERGIES:  None.  SOCIAL HISTORY:  He lives with his son, 2-level home.  He is able to stay on the  main floor.  FUNCTIONAL HISTORY PRIOR TO ADMISSION:  Independent with basic activities of daily living, independent with home making as well as ambulation.  He had been recently working part-time at the [employer].  FUNCTIONAL STATUS ON ADMISSION TO REHAB SERVICES:  Total assist to ambulate 50 feet, 2-person handheld assistance.  PHYSICAL EXAMINATION:  VITAL SIGNS:  Blood pressure 109/61, pulse 43 temperature 98.5, respirations 18. GENERAL:  This was an alert male, oriented x3, appeared well developed. LUNGS:  Clear to auscultation. CARDIAC:  Rate controlled. ABDOMEN:  Soft, nontender.  Good bowel sounds. SKIN:  Warm and dry.  Deep tendon reflexes were normal bilaterally at the upper and lower extremities.  REHABILITATION HOSPITAL COURSE:  The patient was admitted to inpatient rehab services with therapies initiated on a 3-hour daily basis consisting of physical therapy, occupational therapy, and rehabilitation nursing.  The following issues were addressed during the patient's rehabilitation stay.  Pertaining to Mr. Hammock's cervical myelopathy, he completed a course of intravenous Solu-Medrol, now tapering a p.o. course of prednisone per Neurology recommendations.  He was now on Coumadin therapy for atrial flutter with history of atrial septal defect and repair.  He remained on Lovenox until INR greater than 2.  There was a plan for ablation in the near future as per Cardiology Services.  He would follow up with [facility] for ongoing monitoring of his  Coumadin at [phone].  Pain management ongoing with the use of Ultram and good results.  He did have a history of remote head trauma with seizure disorder.  He remained on Dilantin 300 mg daily.  No seizure activity noted.  His blood pressures remained well controlled. No chest pain or shortness of breath.  He remained on Crestor for hyperlipidemia.  He was completing a course of Cipro for an E.  coli urinary tract infection.  He remained on Proscar as well as Cardura for benign prostatic hypertrophy.  His voiding had improved.  The patient received weekly collaborative interdisciplinary team conferences to discuss estimated length of stay, family teaching, and any barriers to discharge.  He was overall supervision to steady assist for his functional mobility.  He received full family teaching with his family prior to discharge home.  He was showing improvement in upper extremity strength, some residual weakness in the deltoid and a cervical 4- cervical 5.  He was essentially supervision for activities of daily living and needing some assistance for buttoning his shirt and putting his head through opening or pull over shirts.  He was to be discharged to home with ongoing therapies as dictated per rehab services.  LATEST LABS:  Showed an INR of 1.80, hemoglobin 11.7, hematocrit 35, platelet 163,000, WBC of 7.  Chemistries:  Sodium 139, potassium 4.0, BUN 17 creatinine 0.8.  DISCHARGE MEDICATIONS AT TIME OF DICTATION: 1. Vitamin D 2000 units daily. 2. Cipro 250 mg twice daily, completed December 13 for an E. coli     urinary tract infection. 3. Cardura 4 mg daily. 4. Ferrous sulfate 325 mg twice daily. 5. Proscar 5 mg daily. 6. Protonix 40 mg daily. 7. Dilantin 300 mg at bedtime. 8. MiraLAX daily with 8 ounces of water. 9. Complete prednisone taper as advised. 10.Crestor 20 mg daily. 11.Senokot tablets 2 at bedtime. 12.Coumadin, latest dose of 4 mg daily, adjusted accordingly for an     INR of 2.0-3.0. 13.Ultram 100 mg every 8 hours as needed pain, dispense of 60 tablets.  DIET:  Regular.  SPECIAL INSTRUCTIONS:  Home health nurse was arranged for Coumadin therapy with results of [facility], [phone]. Also, the patient would follow up Cardiology Services in the near future for ablation therapy.  The patient would follow up with Dr. Faith Rogue  at the outpatient rehab service office as directed.  Ongoing therapies were dictated as per rehab services.     Mariam Dollar, P.A.   ______________________________ Ranelle Oyster, M.D.    DA/MEDQ  D:  05/24/2011  T:  05/24/2011  Job:  [id number]

## 2011-05-25 MED ORDER — ENOXAPARIN SODIUM 30 MG/0.3ML ~~LOC~~ SOLN: 30.0000 mg | Freq: Two times a day (BID) | SUBCUTANEOUS | Status: AC

## 2011-05-25 MED ORDER — ENOXAPARIN (LOVENOX) PATIENT EDUCATION KIT
PACK | Freq: Once | Status: AC
  Administered 2011-05-25: 11:00:00
  Filled 2011-05-25: qty 1

## 2011-05-25 MED ORDER — WARFARIN SODIUM 5 MG PO TABS: 5.0000 mg | ORAL_TABLET | Freq: Every day | ORAL | Status: DC

## 2011-05-25 MED ORDER — FERROUS SULFATE 325 (65 FE) MG PO TABS: 325.0000 mg | ORAL_TABLET | Freq: Two times a day (BID) | ORAL | Status: DC

## 2011-05-25 MED ORDER — CIPROFLOXACIN HCL 250 MG PO TABS: 250.0000 mg | ORAL_TABLET | Freq: Two times a day (BID) | ORAL | Status: AC

## 2011-05-25 MED ORDER — ROSUVASTATIN CALCIUM 20 MG PO TABS: 20.0000 mg | ORAL_TABLET | Freq: Every day | ORAL | Status: DC

## 2011-05-25 NOTE — Progress Notes (Signed)
Physical Therapy Discharge Summary  Patient Details  Name: Jay Walters MRN: [id number] Date of Birth: 06-02-1932 Today's Date: 05/25/2011  Patient has met 3 of 8 long term goals due to improved activity tolerance and improved balance. Goals were initially set for modified independent but due to pt still requiring cueing for safety with assistive device due to decreased balance strategies, pt is functioning at overall supervision level. Therefore, pt did not meet the 5 modified independent goals.  Patient to discharge at an ambulatory level Supervision.   Patient's family is independent to provide the necessary supervision assistance at discharge.  Recommendation:  Patient will benefit from ongoing skilled PT services in outpatient setting to continue to advance safe functional mobility, address ongoing impairments in gait, balance, strength, and minimize fall risk.  Equipment: Equipment provided: RW  Patient/family agrees with progress made and goals achieved: Yes  Tedd Sias 05/25/2011, 12:44 PM

## 2011-05-25 NOTE — Progress Notes (Signed)
Social Work Discharge Note Discharge Note  The overall goal for the admission was met for:   Discharge location: Yes-HOME WITH SON AND WIFE, BE WITH 3 WEEKS AFTER DISCHARGE  Length of Stay: Yes-9 DAYS  Discharge activity level: Yes-SUPERVISION LEVEL  Home/community participation: Yes  Services provided included: MD, RD, PT, OT, RN, CM, TR, Pharmacy and SW  Financial Services: Medicare and Private Insurance: Investment banker, operational HEALTHCARE  Follow-up services arranged: Outpatient: [facility]-12/18 10:00-12:00  Comments (or additional information): [facility]-D[facility]-ROLLING WALKER, 3 IN1, TUB SEAT  Patient/Family verbalized understanding of follow-up arrangements: Yes  Individual responsible for coordination of the follow-up plan: CECELIA-WIFE  Confirmed correct DME delivered: Lucy Chris 05/25/2011    Lucy Chris

## 2011-05-25 NOTE — Progress Notes (Signed)
Patient ID: Jay Walters, male   DOB: 05/23/1932, 77 y.o.   MRN: [id number] Patient ID: Jay Walters, male   DOB: 1931/07/08, 77 y.o.   MRN: [id number] Subjective/Complaints: Review of Systems  Gastrointestinal: Positive for constipation.  Musculoskeletal: Positive for myalgias.  Neurological: Positive for tingling and sensory change.  All other systems reviewed and are negative.     Objective: Vital Signs: Blood pressure 92/56, pulse 74, temperature 99.4 F (37.4 C), temperature source Oral, resp. rate 20, height 5\' 9"  (1.753 m), weight 57.244 kg (126 lb 3.2 oz), SpO2 98.00%. No results found.  Basename 05/23/11 0645  WBC 5.7  HGB 11.7*  HCT 35.5*  PLT 163   No results found for this basename: NA:2,K:2,CL:2,CO2:2,GLUCOSE:2,BUN:2,CREATININE:2,CALCIUM:2 in the last 72 hours CBG (last 3)  No results found for this basename: GLUCAP:3 in the last 72 hours  Wt Readings from Last 3 Encounters:  05/24/11 57.244 kg (126 lb 3.2 oz)  05/14/11 67 kg (147 lb 11.3 oz)  04/18/11 61.689 kg (136 lb)    Physical Exam:  General appearance: alert and no distress Head: Normocephalic, without obvious abnormality, atraumatic Eyes: conjunctivae/corneas clear. PERRL, EOM's intact. Fundi benign. Ears: normal TM's and external ear canals both ears Nose: Nares normal. Septum midline. Mucosa normal. No drainage or sinus tenderness. Throat: lips, mucosa, and tongue normal; teeth and gums normal Neck: no adenopathy, no carotid bruit, no JVD, supple, symmetrical, trachea midline and thyroid not enlarged, symmetric, no tenderness/mass/nodules Back: symmetric, no curvature. ROM normal. No CVA tenderness. Resp: clear to auscultation bilaterally Cardio: irregularly irregular GI: soft, non-tender; bowel sounds normal; no masses,  no organomegaly Extremities: extremities normal, atraumatic, no cyanosis or edema Pulses: 2+ and symmetric Skin: Skin color, texture, turgor normal. No rashes or  lesions Neurologic: C4 and C5 weakness. Deltoids 4-4+/5, Biceps 4+/5.  Tingling and mild sensory loss in hands and sole of left foot has improved a lot.   Incision/Wound: skin clear   Assessment/Plan: 1. Functional deficits secondary to C2 myelopathy/?transverse myelitis  which require 3+ hours per day of interdisciplinary therapy in a comprehensive inpatient rehab setting. Physiatrist is providing close team supervision and 24 hour management of active medical problems listed below. Physiatrist and rehab team continue to assess barriers to discharge/monitor patient progress toward functional and medical goals.  Excited to go home!   Mobility: Bed Mobility Bed Mobility: No Transfers Transfers: Yes Sit to Stand: 5: Supervision;With upper extremity assist Sit to Stand Details (indicate cue type and reason): vc's for hand placement Stand to Sit: With upper extremity assist;5: Supervision Stand to Sit Details: vc's for hand placement Ambulation/Gait Ambulation/Gait Assistance: 5: Supervision Ambulation/Gait Assistance Details (indicate cue type and reason): vc's for tall posture and gluteal activation Ambulation Distance (Feet): 300 Feet (>327ft) Assistive device: Rolling walker Gait Pattern: Decreased step length - right;Decreased step length - left;Trunk flexed Stairs: Yes Stairs Assistance: 4: Min assist Stair Management Technique: Alternating pattern;Two rails;One rail Left Number of Stairs: 5  Wheelchair Mobility Wheelchair Mobility: No ADL:   Cognition: Cognition Overall Cognitive Status: Appears within functional limits for tasks assessed Arousal/Alertness: Awake/alert Orientation Level: Oriented X4 Cognition Arousal/Alertness: Awake/alert Orientation Level: Oriented X4   1.Cervical Myelopathy-IV solumedrol completed.Prednisone taper per neurology recommendations. Expect more clinical improvement over next week or so 2. DVT Prophylaxis/Anticoagulation: Atrial flutter  with history atrial septal defect with repair. coumadin per pharm with lovenox cross coverage.  I would send him home with lovenox coverage until INR greater than 2.0.  Will need outpt lab draw  tomorrow.  - Plan ablation in the near future as per cardiology   3.Neurogenic bowel and bladder-. Continue proscar and cardura  -E Coli UTI treated  -still a little frequent with urine but much better.  Should normalize more once home.   4.. Pain Management: ultram. Monitor with increased activity especially in upper extremities.   -shoulder sx are likely due to deltoid, RTC weakness.  Advised support of arms when in bed (sleeps with HOB elevated).  Can use heat also.  Improving. Should resolve completely with better motor control of shoulder.  5..History seizure disorder-Dilantin 300mg  daily. No seizure activity.   6.Hyperlipidemia-crestor   7.GERD-protonix  8. Neurogenic bowel: Moved bowels yesterday  9. Anemia: Fe supplement   10. A-flutter:  Has been bradycardic but this has shown some improvement.  Outpt cards f/u     LOS (Days) 9 A FACE TO FACE EVAL WAS PERFORMED WITH THIS PATIENT  SWARTZ,ZACHARY T 05/25/2011, 7:08 AM

## 2011-05-25 NOTE — Discharge Summary (Signed)
Jay Walters, SEGAL               ACCOUNT NO.:  [id number]  MEDICAL RECORD NO.:  [id number]  LOCATION:  4142                         FACILITY:  [facility]  PHYSICIAN:  Ranelle Oyster, M.D.DATE OF BIRTH:  15-Nov-1931  DATE OF ADMISSION:  05/16/2011 DATE OF DISCHARGE:                              DISCHARGE SUMMARY   ADDENDUM  The patient is scheduled for discharge to home on December 13.  INR on morning of discharge was 1.54.  Due to the fact that he was subtherapeutic on Coumadin therapy, he will be discharged on Coumadin 5 mg daily as well as subcutaneous Lovenox 30 mg every 12 hours for bridging until INR greater than 2.  All issues in relation to this were discussed with the patient.  [facility] would be notified if the patient's latest INR and Lovenox therapy as the patient was notified to have INR checked on December 14.     Mariam Dollar, P.A.   ______________________________ Ranelle Oyster, M.D.    DA/MEDQ  D:  05/25/2011  T:  05/25/2011  Job:  [id number]

## 2011-05-29 NOTE — Progress Notes (Signed)
05/25/11 1130 Discharge to home accompanied by son.  Discharge info given to patient and son and reviewed lovenox injections with both prior to discharge.  Both state an understanding of info given by PA and nurse.  No questions noted. Pamelia Hoit

## 2011-05-30 ENCOUNTER — Ambulatory Visit: Payer: 59 | Attending: Physical Medicine & Rehabilitation | Admitting: Physical Therapy

## 2011-05-30 ENCOUNTER — Ambulatory Visit: Payer: 59 | Admitting: Occupational Therapy

## 2011-05-30 DIAGNOSIS — R269 Unspecified abnormalities of gait and mobility: Secondary | ICD-10-CM | POA: Insufficient documentation

## 2011-05-30 DIAGNOSIS — IMO0001 Reserved for inherently not codable concepts without codable children: Secondary | ICD-10-CM | POA: Insufficient documentation

## 2011-05-30 DIAGNOSIS — M6281 Muscle weakness (generalized): Secondary | ICD-10-CM | POA: Insufficient documentation

## 2011-06-08 ENCOUNTER — Ambulatory Visit: Payer: 59 | Admitting: Physical Therapy

## 2011-06-08 ENCOUNTER — Ambulatory Visit: Payer: 59 | Admitting: *Deleted

## 2011-06-14 LAB — NEUROMYELITIS OPTICA AUTOAB, IGG: NMO-IgG: 1.6

## 2011-06-15 ENCOUNTER — Ambulatory Visit: Payer: 59 | Attending: Physical Medicine & Rehabilitation | Admitting: *Deleted

## 2011-06-15 ENCOUNTER — Ambulatory Visit: Payer: 59 | Admitting: Physical Therapy

## 2011-06-15 DIAGNOSIS — IMO0001 Reserved for inherently not codable concepts without codable children: Secondary | ICD-10-CM | POA: Insufficient documentation

## 2011-06-15 DIAGNOSIS — R269 Unspecified abnormalities of gait and mobility: Secondary | ICD-10-CM | POA: Insufficient documentation

## 2011-06-15 DIAGNOSIS — M6281 Muscle weakness (generalized): Secondary | ICD-10-CM | POA: Insufficient documentation

## 2011-06-20 ENCOUNTER — Ambulatory Visit: Payer: 59 | Admitting: Occupational Therapy

## 2011-06-20 ENCOUNTER — Ambulatory Visit: Payer: 59 | Admitting: Physical Therapy

## 2011-06-22 ENCOUNTER — Ambulatory Visit: Payer: 59 | Admitting: Occupational Therapy

## 2011-06-22 ENCOUNTER — Ambulatory Visit: Payer: 59 | Admitting: Physical Therapy

## 2011-06-26 HISTORY — PX: OTHER SURGICAL HISTORY: SHX169

## 2011-06-27 ENCOUNTER — Ambulatory Visit (INDEPENDENT_AMBULATORY_CARE_PROVIDER_SITE_OTHER): Payer: 59 | Admitting: Internal Medicine

## 2011-06-27 ENCOUNTER — Ambulatory Visit: Payer: 59 | Admitting: Occupational Therapy

## 2011-06-27 ENCOUNTER — Ambulatory Visit: Payer: 59 | Admitting: Physical Therapy

## 2011-06-27 VITALS — BP 130/68 | HR 52 | Temp 97.6°F | Wt 133.0 lb

## 2011-06-27 DIAGNOSIS — I2721 Secondary pulmonary arterial hypertension: Secondary | ICD-10-CM

## 2011-06-27 DIAGNOSIS — I4892 Unspecified atrial flutter: Secondary | ICD-10-CM

## 2011-06-27 DIAGNOSIS — Z87898 Personal history of other specified conditions: Secondary | ICD-10-CM

## 2011-06-27 DIAGNOSIS — G40909 Epilepsy, unspecified, not intractable, without status epilepticus: Secondary | ICD-10-CM | POA: Diagnosis not present

## 2011-06-27 DIAGNOSIS — G959 Disease of spinal cord, unspecified: Secondary | ICD-10-CM

## 2011-06-27 DIAGNOSIS — I2789 Other specified pulmonary heart diseases: Secondary | ICD-10-CM

## 2011-06-27 DIAGNOSIS — M4712 Other spondylosis with myelopathy, cervical region: Secondary | ICD-10-CM | POA: Diagnosis not present

## 2011-06-27 DIAGNOSIS — Z8639 Personal history of other endocrine, nutritional and metabolic disease: Secondary | ICD-10-CM

## 2011-06-29 ENCOUNTER — Ambulatory Visit: Payer: 59 | Admitting: Occupational Therapy

## 2011-06-29 ENCOUNTER — Ambulatory Visit: Payer: 59 | Admitting: Physical Therapy

## 2011-07-04 ENCOUNTER — Encounter: Payer: Medicare Other | Admitting: Occupational Therapy

## 2011-07-04 ENCOUNTER — Ambulatory Visit: Payer: Medicare Other | Admitting: Physical Therapy

## 2011-07-06 ENCOUNTER — Ambulatory Visit: Payer: Medicare Other | Admitting: Physical Therapy

## 2011-07-06 ENCOUNTER — Encounter: Payer: Medicare Other | Admitting: Occupational Therapy

## 2011-07-07 ENCOUNTER — Encounter: Payer: 59 | Attending: Physical Medicine & Rehabilitation | Admitting: Physical Medicine & Rehabilitation

## 2011-07-07 DIAGNOSIS — I4891 Unspecified atrial fibrillation: Secondary | ICD-10-CM | POA: Diagnosis not present

## 2011-07-07 DIAGNOSIS — M4712 Other spondylosis with myelopathy, cervical region: Secondary | ICD-10-CM

## 2011-07-07 DIAGNOSIS — I4892 Unspecified atrial flutter: Secondary | ICD-10-CM | POA: Insufficient documentation

## 2011-07-07 DIAGNOSIS — M1710 Unilateral primary osteoarthritis, unspecified knee: Secondary | ICD-10-CM | POA: Diagnosis not present

## 2011-07-07 NOTE — Assessment & Plan Note (Signed)
Jay Walters is back regarding his cervical myelopathy versus transverse myelitis.  He discharged home in mid December and has been doing well. 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  The patient denies pain.  He uses a cane for balance.  He is doing his exercises at home.  Sleeps well.  He still has some numbness over the fingers and distal hands.  Denies any mood related issues.  REVIEW OF SYSTEMS:  Full 12-point review is in the written health and history section of the chart.  He denies any heart related issues.  He continues on his Coumadin therapy for his Aflutter.  SOCIAL HISTORY:  The patient is married, living with his wife and son currently.  PHYSICAL EXAMINATION:  VITAL SIGNS:  Blood pressure is 119/42, pulse is 50, respiratory rate 14, he is satting 100% on room air. GENERAL:  The patient is pleasant and alert.  He stands for me with some extra time for balance.  He ambulated for me today and has no gross difficulties with gait, although he tends to swing the legs a bit laterally.  I asked him to walk heel-to-toe and he decompensated quickly. MUSCULOSKELETAL:  Strength in the lower extremities is grossly 4+ to 5 out of 5.  Upper extremity strength is 4+ out of 5 to 5 out of 5 except for left shoulder which is 4 to 4+ out of 5 with abduction.  He has some diminishment of fine touch in the bilateral fingers, palms, and wrist. Neck posture and position are good. PSYCHIATRIC:  Cognitively, he is alert and appropriate.  ASSESSMENT: 1. Cervical myelopathy. 2. Osteoarthritis of the right knee. 3. History of atrial flutter on Coumadin.  PLAN: 1. The patient is doing quite well at this point.  Recommended     continued exercise to build up lower extremity strength and     proximal upper extremity strength.  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  I told him that if he used a cane to     enter the facility and if he is seated for the majority of the time     of his work that he should be fine.  I asked him to use good     judgment and not lifting anything more than 5 or 10 pounds at a     time.  He agreed to do that. 2. He will follow up with Dr. Darrelyn Hillock, regarding his right knee.  At     this point, I do not see how he would be a candidate for knee     replacement, although it had been discussed before.  Recommended     exhausting conservative routes first before the leg. 3. I will see the patient back on an as-needed basis.  He will call me     with any issues.     Jay Walters, M.D. Electronically Signed    ZTS/MedQ D:  07/07/2011 10:23:31  T:  07/07/2011 19:49:00  Job #:  [id number]

## 2011-07-10 ENCOUNTER — Encounter: Payer: Self-pay | Admitting: Internal Medicine

## 2011-07-19 ENCOUNTER — Encounter: Payer: Self-pay | Admitting: Internal Medicine

## 2011-07-19 ENCOUNTER — Ambulatory Visit (INDEPENDENT_AMBULATORY_CARE_PROVIDER_SITE_OTHER): Payer: 59 | Admitting: Internal Medicine

## 2011-07-19 ENCOUNTER — Encounter: Payer: Self-pay | Admitting: *Deleted

## 2011-07-19 VITALS — BP 112/64 | HR 50 | Resp 18 | Ht 69.0 in | Wt 134.8 lb

## 2011-07-19 DIAGNOSIS — I4891 Unspecified atrial fibrillation: Secondary | ICD-10-CM

## 2011-07-19 DIAGNOSIS — R001 Bradycardia, unspecified: Secondary | ICD-10-CM

## 2011-07-19 DIAGNOSIS — I4892 Unspecified atrial flutter: Secondary | ICD-10-CM | POA: Diagnosis not present

## 2011-07-19 DIAGNOSIS — I498 Other specified cardiac arrhythmias: Secondary | ICD-10-CM

## 2011-07-19 NOTE — Patient Instructions (Signed)

## 2011-07-20 ENCOUNTER — Encounter: Payer: Self-pay | Admitting: Internal Medicine

## 2011-07-20 LAB — CBC WITH DIFFERENTIAL/PLATELET
Basophils Relative: 1.4 % (ref 0.0–3.0)
Eosinophils Relative: 1.2 % (ref 0.0–5.0)
Lymphs Abs: 1.2 10*3/uL (ref 0.7–4.0)
MCHC: 33.7 g/dL (ref 30.0–36.0)
Monocytes Absolute: 0.3 10*3/uL (ref 0.1–1.0)
Neutro Abs: 1.8 10*3/uL (ref 1.4–7.7)
RDW: 14.7 % — ABNORMAL HIGH (ref 11.5–14.6)

## 2011-07-20 LAB — BASIC METABOLIC PANEL
CO2: 33 mEq/L — ABNORMAL HIGH (ref 19–32)
Calcium: 9.3 mg/dL (ref 8.4–10.5)
Creatinine, Ser: 0.9 mg/dL (ref 0.4–1.5)
GFR: 107.77 mL/min (ref >60.00)
Glucose, Bld: 104 mg/dL — ABNORMAL HIGH (ref 70–99)

## 2011-07-20 LAB — PROTIME-INR

## 2011-07-20 NOTE — Progress Notes (Signed)
Primary Care Physician: [name], MD, MD Referring Physician:  Dr Clemon Chambers Lefeber is a 78 y.o. male with a h/o an ASD repair by Dr. Olga Millers in 1972 and remote atrial fibrillation. Per report, he had one episode of atrial fibrillation documented in the early 90s but has had none since. He was admitted to [facility] 12/12 with severe pain and numbness across his upper back which radiated to his bilateral shoulders with right arm and right leg numbness and weakness.  He received steroids and rehab with significant improvement. During evaluate, he was observed to be in atrial flutter. His ventricular rate was  40s and he noted fatigue.  He was felt to have possible stroke and was initiated on coumadin with plans to follow-up with me in the office.  He has done very well since discharge.  His backpain is resolved and his strength significantly improved.  He has spontaneously converted to sinus rhythm.  Today, he denies symptoms of palpitations, chest pain, shortness of breath, orthopnea, PND, lower extremity edema, dizziness, presyncope, syncope, or neurologic sequela. The patient is tolerating medications without difficulties and is otherwise without complaint today.   Past Medical History  Diagnosis Date  . GERD (gastroesophageal reflux disease)   . Dysphagia     with solids  . Seizures   . Hypertension   . Hiatal hernia   . Esophageal stricture   . Atrial flutter     typical appearing by ekg  . Diverticulosis   . Osteopenia   . Hemorrhoids    Past Surgical History  Procedure Date  . Asd repair [id number]  . Knee arthroscopy   . Inguinal hernia repair 2011    Right    Current Outpatient Prescriptions  Medication Sig Dispense Refill  . Cholecalciferol (VITAMIN D3) 1000 UNITS CAPS Take 2,000 Units by mouth daily.       Marland Kitchen doxazosin (CARDURA) 4 MG tablet Take 4 mg by mouth every evening.       . finasteride (PROSCAR) 5 MG tablet Take 5 mg by mouth every evening.       . Multiple  Vitamins-Minerals (MULTIVITAMINS THER. W/MINERALS) TABS Take 1 tablet by mouth daily.        . pantoprazole (PROTONIX) 40 MG tablet Take 40 mg by mouth every evening.       . phenytoin (DILANTIN) 100 MG ER capsule Take 300 mg by mouth at bedtime.       Marland Kitchen warfarin (COUMADIN) 5 MG tablet Take 1 tablet (5 mg total) by mouth daily.  30 tablet  1  . ferrous sulfate 325 (65 FE) MG tablet Take 1 tablet (325 mg total) by mouth 2 (two) times daily with a meal.  60 tablet  0  . rosuvastatin (CRESTOR) 20 MG tablet Take 1 tablet (20 mg total) by mouth daily at 6 PM.  30 tablet  1  . traMADol (ULTRAM) 50 MG tablet Take 100 mg by mouth every 8 (eight) hours as needed. For pain Maximum dose= 8 tablets per day         No Known Allergies  History   Social History  . Marital Status: Married    Spouse Name: N/A    Number of Children: N/A  . Years of Education: N/A   Occupational History  . Not on file.   Social History Main Topics  . Smoking status: Never Smoker   . Smokeless tobacco: Never Used  . Alcohol Use: 0.0 oz/week    0.25  drink(s) per week  . Drug Use: No  . Sexually Active: Not Currently   Other Topics Concern  . Not on file   Social History Narrative  . No narrative on file    Family History  Problem Relation Age of Onset  . Heart disease Father     ROS- All systems are reviewed and negative except as per the HPI above  Physical Exam: Filed Vitals:   07/19/11 1553  BP: 112/64  Pulse: 50  Resp: 18  Height: 5\' 9"  (1.753 m)  Weight: 134 lb 12.8 oz (61.145 kg)    GEN- The patient is well appearing, alert and oriented x 3 today.   Head- normocephalic, atraumatic Eyes-  Sclera clear, conjunctiva pink Ears- hearing intact Oropharynx- clear Neck- supple, no JVP Lymph- no cervical lymphadenopathy Lungs- Clear to ausculation bilaterally, normal work of breathing Heart- Regular rate and rhythm, no murmurs, rubs or gallops, PMI not laterally displaced GI- soft, NT, ND, +  BS Extremities- no clubbing, cyanosis, or edema MS- upper extremity strength is much improved Skin- no rash or lesion Psych- euthymic mood, full affect Neuro- strength and sensation are intact  EKG- sinus bradycardia 50 bpm, PR , QRS 86, Qtc 386, poor R wave progression  Assessment and Plan:

## 2011-07-20 NOTE — Assessment & Plan Note (Signed)
Asymptomatic. No changes 

## 2011-07-20 NOTE — Assessment & Plan Note (Signed)
Given remote history of atrial fibrillation and recent concerns for CVA, it may be reasonable to continue long term anticoagulation even post atrial flutter ablation.

## 2011-07-20 NOTE — Assessment & Plan Note (Signed)
The patient recently presented with symptomatic typical appearing atrial flutter. Therapeutic strategies for atrial flutter including medicine and ablation were discussed in detail with the patient today. Risk, benefits, and alternatives to EP study and radiofrequency ablation for afib were also discussed in detail today. These risks include but are not limited to stroke, bleeding, vascular damage, tamponade, perforation, damage to the heart and other structures, AV block requiring PPM, worsening renal function, and death. The patient understands these risk and wishes to proceed.  We will therefore proceed with catheter ablation at the next available time.

## 2011-07-27 ENCOUNTER — Other Ambulatory Visit: Payer: Self-pay | Admitting: *Deleted

## 2011-07-27 ENCOUNTER — Ambulatory Visit (INDEPENDENT_AMBULATORY_CARE_PROVIDER_SITE_OTHER): Payer: 59 | Admitting: *Deleted

## 2011-07-27 DIAGNOSIS — I4892 Unspecified atrial flutter: Secondary | ICD-10-CM

## 2011-07-27 LAB — PROTIME-INR: INR: 2.8 ratio — ABNORMAL HIGH (ref 0.8–1.0)

## 2011-07-28 ENCOUNTER — Ambulatory Visit (HOSPITAL_COMMUNITY): Payer: 59 | Admitting: Anesthesiology

## 2011-07-28 ENCOUNTER — Other Ambulatory Visit: Payer: Self-pay

## 2011-07-28 ENCOUNTER — Encounter (HOSPITAL_COMMUNITY)
Admission: RE | Disposition: A | Discharge status: Home / Self Care | Payer: Self-pay | Source: Ambulatory Visit | Attending: Internal Medicine

## 2011-07-28 ENCOUNTER — Encounter (HOSPITAL_COMMUNITY): Payer: Self-pay | Admitting: Anesthesiology

## 2011-07-28 ENCOUNTER — Ambulatory Visit (HOSPITAL_COMMUNITY)
Admission: RE | Admit: 2011-07-28 | Discharge: 2011-07-29 | Disposition: A | Discharge status: Home / Self Care | Payer: 59 | Source: Ambulatory Visit | Attending: Internal Medicine | Admitting: Internal Medicine

## 2011-07-28 DIAGNOSIS — Z8673 Personal history of transient ischemic attack (TIA), and cerebral infarction without residual deficits: Secondary | ICD-10-CM | POA: Insufficient documentation

## 2011-07-28 DIAGNOSIS — I10 Essential (primary) hypertension: Secondary | ICD-10-CM | POA: Insufficient documentation

## 2011-07-28 DIAGNOSIS — K222 Esophageal obstruction: Secondary | ICD-10-CM | POA: Insufficient documentation

## 2011-07-28 DIAGNOSIS — Z8774 Personal history of (corrected) congenital malformations of heart and circulatory system: Secondary | ICD-10-CM

## 2011-07-28 DIAGNOSIS — M899 Disorder of bone, unspecified: Secondary | ICD-10-CM | POA: Insufficient documentation

## 2011-07-28 DIAGNOSIS — R569 Unspecified convulsions: Secondary | ICD-10-CM | POA: Insufficient documentation

## 2011-07-28 DIAGNOSIS — R1310 Dysphagia, unspecified: Secondary | ICD-10-CM | POA: Insufficient documentation

## 2011-07-28 DIAGNOSIS — I4892 Unspecified atrial flutter: Secondary | ICD-10-CM | POA: Insufficient documentation

## 2011-07-28 DIAGNOSIS — K649 Unspecified hemorrhoids: Secondary | ICD-10-CM | POA: Insufficient documentation

## 2011-07-28 DIAGNOSIS — K449 Diaphragmatic hernia without obstruction or gangrene: Secondary | ICD-10-CM | POA: Insufficient documentation

## 2011-07-28 DIAGNOSIS — K219 Gastro-esophageal reflux disease without esophagitis: Secondary | ICD-10-CM | POA: Insufficient documentation

## 2011-07-28 DIAGNOSIS — K5730 Diverticulosis of large intestine without perforation or abscess without bleeding: Secondary | ICD-10-CM | POA: Insufficient documentation

## 2011-07-28 HISTORY — DX: Bradycardia, unspecified: R00.1

## 2011-07-28 HISTORY — DX: Cardiac murmur, unspecified: R01.1

## 2011-07-28 HISTORY — DX: Unspecified osteoarthritis, unspecified site: M19.90

## 2011-07-28 HISTORY — PX: CARDIAC ELECTROPHYSIOLOGY STUDY AND ABLATION: SHX1294

## 2011-07-28 HISTORY — PX: ATRIAL FLUTTER ABLATION: SHX5733

## 2011-07-28 HISTORY — DX: Cerebral infarction, unspecified: I63.9

## 2011-07-28 HISTORY — DX: Pure hypercholesterolemia, unspecified: E78.00

## 2011-07-28 HISTORY — DX: Personal history of other diseases of the digestive system: Z87.19

## 2011-07-28 LAB — PROTIME-INR
INR: 2.91 — ABNORMAL HIGH (ref 0.00–1.49)
Prothrombin Time: 30.9 seconds — ABNORMAL HIGH (ref 11.6–15.2)

## 2011-07-28 SURGERY — ATRIAL FLUTTER ABLATION
Anesthesia: Monitor Anesthesia Care

## 2011-07-28 MED ORDER — HYDROCODONE-ACETAMINOPHEN 5-325 MG PO TABS: 1.0000 | ORAL_TABLET | Freq: Four times a day (QID) | ORAL | Status: DC | PRN

## 2011-07-28 MED ORDER — MEPERIDINE HCL 25 MG/ML IJ SOLN: 6.2500 mg | INTRAMUSCULAR | Status: DC | PRN

## 2011-07-28 MED ORDER — ASPIRIN 81 MG PO TABS: 81.0000 mg | ORAL_TABLET | Freq: Every day | ORAL | Status: DC

## 2011-07-28 MED ORDER — FERROUS SULFATE 325 (65 FE) MG PO TABS
325.0000 mg | ORAL_TABLET | Freq: Every day | ORAL | Status: DC
  Administered 2011-07-29: 325 mg via ORAL
  Filled 2011-07-28 (×2): qty 1

## 2011-07-28 MED ORDER — SIMVASTATIN 20 MG PO TABS
20.0000 mg | ORAL_TABLET | Freq: Every day | ORAL | Status: DC
  Administered 2011-07-28: 20 mg via ORAL
  Filled 2011-07-28 (×2): qty 1

## 2011-07-28 MED ORDER — BUPIVACAINE HCL (PF) 0.25 % IJ SOLN
INTRAMUSCULAR | Status: AC
  Filled 2011-07-28: qty 60

## 2011-07-28 MED ORDER — PANTOPRAZOLE SODIUM 40 MG PO TBEC
40.0000 mg | DELAYED_RELEASE_TABLET | Freq: Every evening | ORAL | Status: DC
  Administered 2011-07-28: 40 mg via ORAL
  Filled 2011-07-28: qty 1

## 2011-07-28 MED ORDER — ONDANSETRON HCL 4 MG/2ML IJ SOLN: 4.0000 mg | Freq: Four times a day (QID) | INTRAMUSCULAR | Status: DC | PRN

## 2011-07-28 MED ORDER — PHENYTOIN SODIUM EXTENDED 100 MG PO CAPS
300.0000 mg | ORAL_CAPSULE | Freq: Every day | ORAL | Status: DC
  Administered 2011-07-28: 300 mg via ORAL
  Filled 2011-07-28 (×3): qty 3

## 2011-07-28 MED ORDER — ACETAMINOPHEN 325 MG PO TABS: 650.0000 mg | ORAL_TABLET | ORAL | Status: DC | PRN

## 2011-07-28 MED ORDER — HEPARIN (PORCINE) IN NACL 2-0.9 UNIT/ML-% IJ SOLN
INTRAMUSCULAR | Status: AC
  Filled 2011-07-28: qty 1000

## 2011-07-28 MED ORDER — PROPOFOL 10 MG/ML IV EMUL
INTRAVENOUS | Status: DC | PRN
  Administered 2011-07-28: 60 ug/kg/min via INTRAVENOUS

## 2011-07-28 MED ORDER — TRAMADOL HCL 50 MG PO TABS: 100.0000 mg | ORAL_TABLET | Freq: Three times a day (TID) | ORAL | Status: DC | PRN

## 2011-07-28 MED ORDER — ASPIRIN 81 MG PO CHEW
81.0000 mg | CHEWABLE_TABLET | Freq: Every day | ORAL | Status: DC
  Administered 2011-07-28 – 2011-07-29 (×2): 81 mg via ORAL
  Filled 2011-07-28 (×2): qty 1

## 2011-07-28 MED ORDER — MORPHINE SULFATE 4 MG/ML IJ SOLN: 0.0500 mg/kg | INTRAMUSCULAR | Status: DC | PRN

## 2011-07-28 MED ORDER — SODIUM CHLORIDE 0.9 % IV SOLN: 250.0000 mL | INTRAVENOUS | Status: DC | PRN

## 2011-07-28 MED ORDER — SODIUM CHLORIDE 0.9 % IJ SOLN
3.0000 mL | Freq: Two times a day (BID) | INTRAMUSCULAR | Status: DC
  Administered 2011-07-28 – 2011-07-29 (×3): 3 mL via INTRAVENOUS

## 2011-07-28 MED ORDER — FINASTERIDE 5 MG PO TABS
5.0000 mg | ORAL_TABLET | Freq: Every evening | ORAL | Status: DC
  Administered 2011-07-28: 5 mg via ORAL
  Filled 2011-07-28 (×2): qty 1

## 2011-07-28 MED ORDER — HYDROMORPHONE HCL PF 1 MG/ML IJ SOLN: 0.2500 mg | INTRAMUSCULAR | Status: DC | PRN

## 2011-07-28 MED ORDER — FENTANYL CITRATE 0.05 MG/ML IJ SOLN
INTRAMUSCULAR | Status: DC | PRN
  Administered 2011-07-28: 50 ug via INTRAVENOUS

## 2011-07-28 MED ORDER — ONDANSETRON HCL 4 MG/2ML IJ SOLN: 4.0000 mg | Freq: Once | INTRAMUSCULAR | Status: DC | PRN

## 2011-07-28 MED ORDER — SODIUM CHLORIDE 0.9 % IJ SOLN: 3.0000 mL | INTRAMUSCULAR | Status: DC | PRN

## 2011-07-28 MED ORDER — DOXAZOSIN MESYLATE 4 MG PO TABS
4.0000 mg | ORAL_TABLET | Freq: Every evening | ORAL | Status: DC
  Administered 2011-07-28: 4 mg via ORAL
  Filled 2011-07-28 (×2): qty 1

## 2011-07-28 MED ORDER — WARFARIN SODIUM 2.5 MG PO TABS
2.5000 mg | ORAL_TABLET | Freq: Once | ORAL | Status: AC
  Administered 2011-07-28: 2.5 mg via ORAL
  Filled 2011-07-28: qty 1

## 2011-07-28 MED ORDER — SODIUM CHLORIDE 0.9 % IV SOLN
INTRAVENOUS | Status: DC | PRN
  Administered 2011-07-28: 08:00:00 via INTRAVENOUS

## 2011-07-28 MED ORDER — MIDAZOLAM HCL 5 MG/5ML IJ SOLN
INTRAMUSCULAR | Status: DC | PRN
  Administered 2011-07-28: 2 mg via INTRAVENOUS

## 2011-07-28 MED ORDER — HEPARIN SODIUM (PORCINE) 1000 UNIT/ML IJ SOLN
INTRAMUSCULAR | Status: AC
  Filled 2011-07-28: qty 1

## 2011-07-28 MED ORDER — HYDROCODONE-ACETAMINOPHEN 5-500 MG PO TABS: 1.0000 | ORAL_TABLET | Freq: Four times a day (QID) | ORAL | Status: DC | PRN

## 2011-07-28 NOTE — Anesthesia Preprocedure Evaluation (Addendum)
Anesthesia Evaluation  Patient identified by MRN, date of birth, ID band Patient awake    Reviewed: Allergy & Precautions, H&P , NPO status , Patient's Chart, lab work & pertinent test results  Airway       Dental   Pulmonary          Cardiovascular hypertension, Pt. on medications + dysrhythmias Atrial Fibrillation     Neuro/Psych  Headaches, Seizures -,     GI/Hepatic hiatal hernia, GERD-  Medicated,  Endo/Other    Renal/GU      Musculoskeletal   Abdominal   Peds  Hematology   Anesthesia Other Findings   Reproductive/Obstetrics                           Anesthesia Physical Anesthesia Plan  ASA: III  Anesthesia Plan: MAC   Post-op Pain Management:    Induction: Intravenous  Airway Management Planned: Simple Face Mask  Additional Equipment:   Intra-op Plan:   Post-operative Plan:   Informed Consent: I have reviewed the patients History and Physical, chart, labs and discussed the procedure including the risks, benefits and alternatives for the proposed anesthesia with the patient or authorized representative who has indicated his/her understanding and acceptance.   Dental advisory given  Plan Discussed with: CRNA and Surgeon  Anesthesia Plan Comments:        Anesthesia Quick Evaluation

## 2011-07-28 NOTE — Brief Op Note (Signed)
07/28/2011  9:46 AM  PATIENT:  Jay Walters  78 y.o. male  PRE-OPERATIVE DIAGNOSIS:  Atrial flutter  POST-OPERATIVE DIAGNOSIS:  Counter Clockwise Isthmus dependant right atrial flutter  PROCEDURE:  Procedure(s) (LRB):  EP study and ablation of SVT  SURGEON:  Surgeon(s) and Role:    * Gardiner Rhyme, MD - Primary  PHYSICIAN ASSISTANT:  none  ASSISTANTS: none   ANESTHESIA:   IV sedation  EBL:  Total I/O In: 500 [I.V.:500] Out: -   BLOOD ADMINISTERED:none  DRAINS: none   LOCAL MEDICATIONS USED:  NONE  SPECIMEN:  No Specimen  DISPOSITION OF SPECIMEN:  N/A  COUNTS:  YES  TOURNIQUET:  * No tourniquets in log *  DICTATION: .Other Dictation: Dictation Number [id number]  PLAN OF CARE: Admit for overnight observation  PATIENT DISPOSITION:  PACU - hemodynamically stable.   Delay start of Pharmacological VTE agent (>24hrs) due to surgical blood loss or risk of bleeding: no

## 2011-07-28 NOTE — Interval H&P Note (Signed)
History and Physical Interval Note:  07/28/2011 7:16 AM  Jay Walters  has presented today for surgery, with the diagnosis of Atrial Flutter  The various methods of treatment have been discussed with the patient and family. After consideration of risks, benefits and other options for treatment, the patient has consented to  Procedure(s) (LRB):  EP study and radiofrequency ablation as a surgical intervention .  The patients' history has been reviewed, patient examined, no change in status, stable for surgery.  I have reviewed the patients' chart and labs.  Questions were answered to the patient's satisfaction.     Hillis Range

## 2011-07-28 NOTE — Transfer of Care (Signed)
Immediate Anesthesia Transfer of Care Note  Patient: Jay Walters  Procedure(s) Performed: Procedure(s) (LRB): ATRIAL FLUTTER ABLATION (N/A)  Patient Location: PACU  Anesthesia Type: MAC  Level of Consciousness: awake and alert   Airway & Oxygen Therapy: Patient Spontanous Breathing  Post-op Assessment: Report given to PACU RN and Post -op Vital signs reviewed and stable  Post vital signs: Reviewed and stable  Complications: No apparent anesthesia complications

## 2011-07-28 NOTE — Preoperative (Signed)
Beta Blockers   Reason not to administer Beta Blockers:Not Applicable 

## 2011-07-28 NOTE — Op Note (Signed)
Jay Walters, Jay Walters               ACCOUNT NO.:  [id number]  MEDICAL RECORD NO.:  [id number]  LOCATION:  [facility]                         FACILITY:  [facility]  PHYSICIAN:  Jay Range, MD       DATE OF BIRTH:  1932/01/02  DATE OF PROCEDURE: DATE OF DISCHARGE:                              OPERATIVE REPORT   SURGEON:  Jay Range, MD  PREPROCEDURE DIAGNOSIS:  Atrial flutter.  POSTPROCEDURE DIAGNOSIS:  Counterclockwise isthmus-dependent right atrial flutter.  PROCEDURES: 1. Comprehensive EP study. 2. Coronary sinus pacing and recording. 3. 3D mapping of SVT. 4. Ablation of SVT (atrial flutter).  INTRODUCTION:  Jay Walters is a pleasant 78 year old gentleman with a history of prior ASD repair and recent hospitalization with typical- appearing atrial flutter.  He was discharged on Coumadin and spontaneously converted to sinus rhythm.  He was felt during his hospital stay to have had a cerebral vascular accident.  At this time, it is felt reasonable to proceed with EP study and radiofrequency ablation of his atrial flutter in hopes to reduce his long-term risk for chronic recurrent stroke as well as treatment of symptomatic atrial flutter.  DESCRIPTION OF THE PROCEDURE:  Informed written consent was obtained, and the patient was brought to the electrophysiology lab in the fasting state.  He was adequately sedated with intravenous medications as outlined in the anesthesia report.  The patient's right groin was prepped and draped in the usual sterile fashion by the EP lab staff. Using a percutaneous Seldinger technique, two 7-French and one 8-French hemostasis sheaths were placed into the right common femoral vein.  A 7- [employer] decapolar coronary sinus catheter was introduced through the right common femoral vein and advanced into the coronary sinus for recording and pacing from this location.  A 6-French quadripolar Josephson catheter was introduced through the right  common femoral vein and advanced into the right ventricle for recording and pacing.  This catheter was then pulled back to the His bundle location. The patient presented to the electrophysiology lab in normal sinus rhythm.  His PR interval measured 255 msec with a QRS duration of 109 msec and a QT interval 428 msec.  His average RR interval was 1183 msec. His AH interval measured 239 msec with an HV interval of 45 msec. Ventricular pacing was performed, which revealed VA dissociation at baseline.  Rapid atrial pacing was performed, which revealed no evidence of PR greater than RR.  The AV Wenckebach cycle length was 510 msec. Rapid atrial pacing was continued down to a cycle length of 250 msec at which time, the patient converted to atrial flutter.  The atrial flutter cycle length was 320 msec.  The coronary sinus activation sequence was consistent with proximal to distal activation suggestive of right atrial flutter.  The surface EKG revealed typical atrial flutter and it was felt to represent the patient's clinical arrhythmias.  Entrainment was performed from the left atrium, which revealed a long post pacing interval length compared to the tachycardia cycle length.  Atrial entrainment mapping was then performed from the cavotricuspid isthmus, which revealed a post pacing interval equal to the tachycardia cycle length.  The patient was therefore felt to  have isthmus-dependent right atrial flutter.  Given his history of ASD repair, it was felt prudent to perform three-dimensional mapping.  A 7-French [employer] Webster 8 mm ablation catheter was introduced through the right common femoral vein and advanced into the right atrium.  Three-dimensional electroanatomical atrial mapping was performed using the [employer].  This clearly demonstrated a counterclockwise annular rotation around the tricuspid valve annulus.  More than 85% of the tachycardia cycle length was easily mapped  from the right atrium.  The patient was therefore felt to have counterclockwise isthmus-dependent right atrial flutter.  I therefore elected to perform ablation along the usual cavotricuspid isthmus.  The isthmus was mapped and noted to be quite long and broad. A series of radiofrequency applications were delivered between the tricuspid valve annulus and the inferior vena cava along the usual cavotricuspid isthmus.  The tachycardia was initially not terminated. For a more adequate contact, the 8-French groin sheath was exchanged for an 8.5-French ramp sheath.  With this, adequate anterior exposure was demonstrated and additional ablation was performed along the cavotricuspid isthmus.  The tachycardia then slowed and then terminated during ablation.  Following ablation, a 7-French dual decapolar Halo catheter was introduced through the right common femoral vein and advanced into the right atrium.  This catheter was positioned around the tricuspid valve annulus.  Differential atrial pacing was performed from the low lateral right atrium, which revealed complete bidirectional cavotricuspid isthmus block with a stimulus to earliest atrial activation recorded bidirectionally across the isthmus to measure 190 msec.  The patient was observed for 20 minutes without return of conduction through the isthmus.  Following ablation, the AH interval measured 236 msec with an HV interval of 51 msec.  Rapid atrial pacing was again performed, which revealed an AV Wenckebach cycle length of 500 msec.  With rapid atrial pacing down to 200 msec, atrial fibrillation was observed transiently which spontaneously terminated.  Additional rapid atrial pacing was performed down to a cycle length of 250 msec with no arrhythmias observed.  The procedure was therefore considered completed.  All catheters were removed and the sheaths were aspirated and flushed.  The sheaths were removed and hemostasis was assured. There  were no early apparent complications.  CONCLUSIONS: 1. Sinus rhythm upon presentation. 2. Counterclockwise isthmus-dependent right atrial flutter, induced     with rapid atrial pacing and successfully ablated along the usual     cavotricuspid isthmus. 3. No inducible-sustained arrhythmias following ablation. 4. No early apparent complications.     Jay Range, MD     JA/MEDQ  D:  07/28/2011  T:  07/28/2011  Job:  [id number]  cc:   Gerlene Burdock A. Alanda Amass, M.D.

## 2011-07-28 NOTE — Anesthesia Postprocedure Evaluation (Signed)
Anesthesia Post Note  Patient: Jay Walters  Procedure(s) Performed: Procedure(s) (LRB): ATRIAL FLUTTER ABLATION (N/A)  Anesthesia type: MAC  Patient location: PACU  Post pain: Pain level controlled  Post assessment: Patient's Cardiovascular Status Stable  Last Vitals:  Filed Vitals:   07/28/11 1055  BP: 123/70  Pulse: 54  Temp: 36.3 C  Resp: 16    Post vital signs: Reviewed and stable  Level of consciousness: sedated  Complications: No apparent anesthesia complications

## 2011-07-28 NOTE — H&P (View-Only) (Signed)
Primary Care Physician: BAXLEY,MARY J, MD, MD Referring Physician:  Dr Weintraub  Jay Walters is a 79 y.o. male with a h/o an ASD repair by Dr. Jim Lee in 1972 and remote atrial fibrillation. Per report, he had one episode of atrial fibrillation documented in the early 90s but has had none since. He was admitted to [facility] 12/12 with severe pain and numbness across his upper back which radiated to his bilateral shoulders with right arm and right leg numbness and weakness.  He received steroids and rehab with significant improvement. During evaluate, he was observed to be in atrial flutter. His ventricular rate was  40s and he noted fatigue.  He was felt to have possible stroke and was initiated on coumadin with plans to follow-up with me in the office.  He has done very well since discharge.  His backpain is resolved and his strength significantly improved.  He has spontaneously converted to sinus rhythm.  Today, he denies symptoms of palpitations, chest pain, shortness of breath, orthopnea, PND, lower extremity edema, dizziness, presyncope, syncope, or neurologic sequela. The patient is tolerating medications without difficulties and is otherwise without complaint today.   Past Medical History  Diagnosis Date  . GERD (gastroesophageal reflux disease)   . Dysphagia     with solids  . Seizures   . Hypertension   . Hiatal hernia   . Esophageal stricture   . Atrial flutter     typical appearing by ekg  . Diverticulosis   . Osteopenia   . Hemorrhoids    Past Surgical History  Procedure Date  . Asd repair 1972  . Knee arthroscopy   . Inguinal hernia repair 2011    Right    Current Outpatient Prescriptions  Medication Sig Dispense Refill  . Cholecalciferol (VITAMIN D3) 1000 UNITS CAPS Take 2,000 Units by mouth daily.       . doxazosin (CARDURA) 4 MG tablet Take 4 mg by mouth every evening.       . finasteride (PROSCAR) 5 MG tablet Take 5 mg by mouth every evening.       . Multiple  Vitamins-Minerals (MULTIVITAMINS THER. W/MINERALS) TABS Take 1 tablet by mouth daily.        . pantoprazole (PROTONIX) 40 MG tablet Take 40 mg by mouth every evening.       . phenytoin (DILANTIN) 100 MG ER capsule Take 300 mg by mouth at bedtime.       . warfarin (COUMADIN) 5 MG tablet Take 1 tablet (5 mg total) by mouth daily.  30 tablet  1  . ferrous sulfate 325 (65 FE) MG tablet Take 1 tablet (325 mg total) by mouth 2 (two) times daily with a meal.  60 tablet  0  . rosuvastatin (CRESTOR) 20 MG tablet Take 1 tablet (20 mg total) by mouth daily at 6 PM.  30 tablet  1  . traMADol (ULTRAM) 50 MG tablet Take 100 mg by mouth every 8 (eight) hours as needed. For pain Maximum dose= 8 tablets per day         No Known Allergies  History   Social History  . Marital Status: Married    Spouse Name: N/A    Number of Children: N/A  . Years of Education: N/A   Occupational History  . Not on file.   Social History Main Topics  . Smoking status: Never Smoker   . Smokeless tobacco: Never Used  . Alcohol Use: 0.0 oz/week    0.25   drink(s) per week  . Drug Use: No  . Sexually Active: Not Currently   Other Topics Concern  . Not on file   Social History Narrative  . No narrative on file    Family History  Problem Relation Age of Onset  . Heart disease Father     ROS- All systems are reviewed and negative except as per the HPI above  Physical Exam: Filed Vitals:   07/19/11 1553  BP: 112/64  Pulse: 50  Resp: 18  Height: 5' 9" (1.753 m)  Weight: 134 lb 12.8 oz (61.145 kg)    GEN- The patient is well appearing, alert and oriented x 3 today.   Head- normocephalic, atraumatic Eyes-  Sclera clear, conjunctiva pink Ears- hearing intact Oropharynx- clear Neck- supple, no JVP Lymph- no cervical lymphadenopathy Lungs- Clear to ausculation bilaterally, normal work of breathing Heart- Regular rate and rhythm, no murmurs, rubs or gallops, PMI not laterally displaced GI- soft, NT, ND, +  BS Extremities- no clubbing, cyanosis, or edema MS- upper extremity strength is much improved Skin- no rash or lesion Psych- euthymic mood, full affect Neuro- strength and sensation are intact  EKG- sinus bradycardia 50 bpm, PR 260ms, QRS 86, Qtc 386, poor R wave progression  Assessment and Plan:  

## 2011-07-28 NOTE — Progress Notes (Signed)
Doing well s/p atrial flutter ablation Resume home medications Goal INR 2-3 (followed by [facility]).  Plan to DC to home in early am Follow up with me in 4 weeks.

## 2011-07-29 ENCOUNTER — Encounter (HOSPITAL_COMMUNITY): Payer: Self-pay | Admitting: Physician Assistant

## 2011-07-29 ENCOUNTER — Other Ambulatory Visit: Payer: Self-pay

## 2011-07-29 DIAGNOSIS — I4892 Unspecified atrial flutter: Secondary | ICD-10-CM

## 2011-07-29 NOTE — Discharge Summary (Signed)
Discharge Summary   Patient ID: Jay Walters MRN: [id number], DOB/AGE: 02/19/1932 78 y.o. Admit date: 07/28/2011 D/C date:     07/29/2011   Primary Discharge Diagnoses:  1. Atrial flutter s/p RF ablation 07/28/11, on Coumadin  Secondary Discharge Diagnoses:  1. CVA 05/2011 2. Remote hx of atrial fibrillation 3. ASD repair by Dr. Olga Millers in 1972 4. GERD 5. Dysphagia with solids 6. Seizures 7. HTN 8. Hiatal hernia 9. Esophageal stricture 10. Diverticulosis 11. Hemorrhoids 12. Osteopenia  Hospital Course: 78 y/o M with hx of ASD repair and remote atrial fibrillation who was seen in the office 07/19/11 by Dr. Johney Frame. He was admitted to [facility] 12/12 with severe pain and numbness across his upper back which radiated to his bilateral shoulders with right arm and right leg numbness and weakness. He received steroids and rehab with significant improvement. During evaluation he was noted to be in atrial flutter with v-rate in the 40's with associated fatigue. He was felt to have possible stroke and was initiated on coumadin. He presented for follow-up by Dr. Johney Frame last week. He was noted to have done very well since discharge. He had since spontaneously converted to sinus rhythm. Dr. Johney Frame recommended atrial flutter ablation given his symptomatic nature with this as well as in hopes to reduce his long-term risk for chronic recurrent stroke. The patient was brought in 76/15/13 for ablation of SVT. He tolerated the procedure well without complication. He did not have a ride or someone to stay with him last night so he was observed overnight. Dr. Daleen Squibb has seen and examined him and feels he is stable for discharge. I spoke with the PA on call for [facility] to arrange INR check next week.  Discharge Vitals: Blood pressure 99/58, pulse 56, temperature 98.5 F (36.9 C), temperature source Oral, resp. rate 18, height 5\' 9"  (1.753 m), weight 129 lb 11.2 oz (58.832 kg), SpO2 98.00%.  Labs: Lab Results    Component Value Date   WBC 3.4* 07/19/2011   HGB 11.8* 07/19/2011   HCT 35.1* 07/19/2011   MCV 93.8 07/19/2011   PLT 164.0 07/19/2011   Diagnostic Studies/Procedures   1. RF ablation 07/28/11  Discharge Medications   Medication List  As of 07/29/2011 11:52 AM   TAKE these medications         aspirin 81 MG tablet   Take 81 mg by mouth daily.      atorvastatin 40 MG tablet   Commonly known as: LIPITOR   Take 40 mg by mouth daily.      doxazosin 4 MG tablet   Commonly known as: CARDURA   Take 4 mg by mouth every evening.      ferrous sulfate 325 (65 FE) MG tablet   Take 325 mg by mouth daily with breakfast.      finasteride 5 MG tablet   Commonly known as: PROSCAR   Take 5 mg by mouth every evening.      HYDROcodone-acetaminophen 5-500 MG per tablet   Commonly known as: VICODIN   Take 1 tablet by mouth Every 6 hours while awake. For pain      multivitamins ther. w/minerals Tabs   Take 1 tablet by mouth daily.      pantoprazole 40 MG tablet   Commonly known as: PROTONIX   Take 40 mg by mouth every evening.      phenytoin 100 MG ER capsule   Commonly known as: DILANTIN   Take 300 mg by mouth  at bedtime.      traMADol 50 MG tablet   Commonly known as: ULTRAM   Take 100 mg by mouth every 8 (eight) hours as needed. For pain Maximum dose= 8 tablets per day      Vitamin D3 1000 UNITS Caps   Take 1,000 Units by mouth daily.      warfarin 5 MG tablet   Commonly known as: COUMADIN   Take 5 mg by mouth daily. 1.5 tablets on mondays, wednesdays and fridays. Take 1 tablet the rest of the week.            Disposition   The patient will be discharged in stable condition to home. Discharge Orders    Future Orders Please Complete By Expires   Diet - low sodium heart healthy      Increase activity slowly      Comments:   No driving for 1 day. No lifting over 5 lbs for 4 days. No sexual activity for 4 days. Keep procedure site clean & dry. If you notice increased pain,  swelling, bleeding or pus, call/return!  You may shower, but no soaking baths/hot tubs/pools for 1 week.        Follow-up Information    Follow up with Hillis Range, MD. (Our office will call you for an appointment )    Contact information:   [address], [address] [place] [address] [phone]       Follow up with [facility]. (Their office will call you for an appointment to have your INR checked)            Duration of Discharge Encounter: Greater than 30 minutes including physician and PA time.  Signed, Ronie Spies PA-C 07/29/2011, 11:52 AM  Jay Sans. Daleen Squibb, MD, [facility] Pager:  [phone]

## 2011-07-29 NOTE — Progress Notes (Signed)
Patient ID: Jay Walters, male   DOB: March 19, 1932, 78 y.o.   MRN: [id number] SUBJECTIVE: Jay Walters is doing well after his ablation yesterday. He denies any chest pain shortness of breath he is ready for discharge.Ceasar Mons Vitals:   07/28/11 1459 07/28/11 1750 07/28/11 2100 07/29/11 0500  BP: 93/45 102/46 101/61 99/58  Pulse: 69  85 56  Temp: 97.9 F (36.6 C)  98 F (36.7 C) 98.5 F (36.9 C)  TempSrc: Oral  Oral Oral  Resp: 16  18 18   Height:      Weight:      SpO2: 100%  94% 98%    Intake/Output Summary (Last 24 hours) at 07/29/11 0930 Last data filed at 07/29/11 0500  Gross per 24 hour  Intake    240 ml  Output    776 ml  Net   -536 ml    LABS: Basic Metabolic Panel: No results found for this basename: NA:2,K:2,CL:2,CO2:2,GLUCOSE:2,BUN:2,CREATININE:2,CALCIUM:2,MG:2,PHOS:2 in the last 72 hours Liver Function Tests: No results found for this basename: AST:2,ALT:2,ALKPHOS:2,BILITOT:2,PROT:2,ALBUMIN:2 in the last 72 hours No results found for this basename: LIPASE:2,AMYLASE:2 in the last 72 hours CBC: No results found for this basename: WBC:2,NEUTROABS:2,HGB:2,HCT:2,MCV:2,PLT:2 in the last 72 hours Cardiac Enzymes: No results found for this basename: CKTOTAL:3,CKMB:3,CKMBINDEX:3,TROPONINI:3 in the last 72 hours BNP: No components found with this basename: POCBNP:3 D-Dimer: No results found for this basename: DDIMER:2 in the last 72 hours Hemoglobin A1C: No results found for this basename: HGBA1C in the last 72 hours Fasting Lipid Panel: No results found for this basename: CHOL,HDL,LDLCALC,TRIG,CHOLHDL,LDLDIRECT in the last 72 hours Thyroid Function Tests: No results found for this basename: TSH,T4TOTAL,FREET3,T3FREE,THYROIDAB in the last 72 hours Anemia Panel: No results found for this basename: VITAMINB12,FOLATE,FERRITIN,TIBC,IRON,RETICCTPCT in the last 72 hours  RADIOLOGY: No results found.  PHYSICAL EXAM He is in no acute distress. Alert and oriented x3. Neck  shows no JVD. Chest exam reveals a regular rate and rhythm with no rub. Lungs are clear to auscultation and percussion abdominal exam soft the bowel sounds right groin is stable. No edema with good pulses in the extremities.   TELEMETRY: Reviewed telemetry pt in normal sinus rhythm   ASSESSMENT AND PLAN:  Active Problems:  Atrial flutter  S/P atrial septal defect closure, surgical  He is status post successful ablation with atrial flutter. His INR is therapeutic. Will arrange discharge today with followup with Dr. Johney Frame in 4 weeks. All questions answered the patient is okay with the plan.   Valera Castle, MD 07/29/2011 9:30 AM

## 2011-08-08 ENCOUNTER — Other Ambulatory Visit: Payer: Self-pay | Admitting: Neurology

## 2011-08-08 DIAGNOSIS — I270 Primary pulmonary hypertension: Secondary | ICD-10-CM

## 2011-08-14 ENCOUNTER — Ambulatory Visit
Admission: RE | Admit: 2011-08-14 | Discharge: 2011-08-14 | Disposition: A | Discharge status: Home / Self Care | Payer: 59 | Source: Ambulatory Visit | Attending: Neurology | Admitting: Neurology

## 2011-08-14 ENCOUNTER — Encounter: Payer: Self-pay | Admitting: Internal Medicine

## 2011-08-14 DIAGNOSIS — I270 Primary pulmonary hypertension: Secondary | ICD-10-CM

## 2011-08-14 MED ORDER — IOHEXOL 300 MG/ML  SOLN
75.0000 mL | Freq: Once | INTRAMUSCULAR | Status: AC | PRN
  Administered 2011-08-14: 75 mL via INTRAVENOUS

## 2011-08-14 NOTE — Patient Instructions (Signed)
Continue same medications. Approval to go back to work part-time. Take it easy and do not push yourself. Followup with cardiologist and Dr. Sandria Manly

## 2011-08-14 NOTE — Progress Notes (Signed)
Subjective:    Patient ID: Jay Walters, male    DOB: 31-Oct-1931, 78 y.o.   MRN: [id number]  HPI 78 year old black male was hospitalized in November 2012 with cervical myelopathy possibly due to transverse myelitis. He also had an episode of atrial flutter while hospitalized with ventricular rate in the 40s. He was treated with IV steroids and improved. He was also admitted to the rehabilitation unit for physical therapy. He is now on Coumadin. Consideration is being given to RFA for atrial flutter. During this evaluation he had a CT of the chest that would suggest pulmonary artery hypertension. There were ground glass opacities in both upper lobes. There was central enlargement of the pulmonary arteries consistent with pulmonary artery hypertension. No mediastinal or hilar lymph nodes were noted. No pleural or pericardial effusion noted. He also had a CT of the abdomen and pelvis which was negative for malignancy. Patient's hand grip was good but he had upper extremity weakness bilaterally with muscle strength being 3/5 bilaterally in the proximal extremities.  He had an MRI of the C-spine showing abnormal signal within the spinal cord bilaterally beginning at the C2-C3 level extending to C5. The largest area of hyperintensity in the cord was on the right C3 level. No cord hemorrhage was noted. No tumor noted. Patient had mild spinal stenosis at C2-C3, C3-C4, C4-C5 on the left C5-C6 and C6-C7. This was unchanged from a prior study.  Patient had CT angiography of the neck showing abnormal appearance of the carotid bifurcation bilaterally. It was felt patient could of had thrombus or plaque bilaterally. Less than 50% diameter stenosis of the proximal internal carotid artery bilaterally. No high-grade stenosis of either vertebral artery. Findings raised the possibility of fibromuscular dysplasia involving the distal vertical segment of the right internal carotid artery and left vertebral artery at the C2  level.  He was started on heparin in addition to IV steroids and eventually changed to Coumadin. He suffered a fall while in the hospital. CT of the brain without contrast showed minimal atrophy and no mass affect. He had small vessel chronic ischemic changes of the deep cerebral white matter. No evidence of acute infarction.  He had a spinal tap in the hospital. Cytology of the spinal fluid was negative. Cryptococcal antigen was negative. VDRL was negative on the spinal fluid. Spinal fluid glucose was 66 and protein was 69.  CSF for oligoclonal banding  was negative. Spinal fluid ACE level was 2. Culture was negative the spinal fluid. Sedimentation rate was 4. TSH was 4.185. 2-D echocardiogram showed no evidence of intracardiac thrombi or masses and no PFO.  Patient has a history in the remote past of a seizure disorder and has been maintained on Dilantin 300 mg at bedtime. He is on Crestor 20 mg daily, Protonix 40 mg daily, Proscar 5 mg at bedtime, doxazosin 4 mg daily, vitamin D 2000 units daily for history of vitamin D deficiency.  Patient says that he has gotten quite a bit stronger and is almost back to baseline. Physical therapy has helped great deal. He would like to go back to work. 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    Review of Systems     Objective:   Physical Exam   alert and oriented x3. Chest clear to auscultation. Muscle strength is 5 over 5 in the upper extremities. Judgment and thought process normal. Cranial nerves II through XII are grossly intact. Deep tendon  reflexes 2+ and symmetrical in upper and lower extremities.        Assessment & Plan:  Cervical myelopathy-? Transverse myelitis with significant improvement with IV steroids and physical therapy  Atrial flutter-consideration being given to ablation  Chronic anticoagulation-patient now on Coumadin  Plan: I think it would be all right for patient to  return to work part-time as he has done in the past. He acknowledges that he will take it slowly and not push himself.

## 2011-08-28 ENCOUNTER — Encounter: Payer: Self-pay | Admitting: Internal Medicine

## 2011-08-28 ENCOUNTER — Ambulatory Visit (INDEPENDENT_AMBULATORY_CARE_PROVIDER_SITE_OTHER): Payer: 59 | Admitting: Internal Medicine

## 2011-08-28 DIAGNOSIS — I4892 Unspecified atrial flutter: Secondary | ICD-10-CM | POA: Insufficient documentation

## 2011-08-28 DIAGNOSIS — Z8679 Personal history of other diseases of the circulatory system: Secondary | ICD-10-CM | POA: Diagnosis not present

## 2011-08-28 DIAGNOSIS — J309 Allergic rhinitis, unspecified: Secondary | ICD-10-CM | POA: Insufficient documentation

## 2011-08-28 DIAGNOSIS — R9389 Abnormal findings on diagnostic imaging of other specified body structures: Secondary | ICD-10-CM

## 2011-08-28 NOTE — Patient Instructions (Signed)
Take Augmentin 500 mg 3 times daily for 10 days. We will make appointment for you to see pulmonologist regarding abnormal chest CT

## 2011-08-28 NOTE — Progress Notes (Signed)
  Subjective:    Patient ID: Jay Walters, male    DOB: 1931-08-10, 78 y.o.   MRN: [id number]  HPI 78 year old black male hospitalized November 2012 with cervical myelopathy possibly due to transverse myelitis. He also had an episode of atrial flutter while hospitalized with ventricular rate in the 40s. About a month ago, he had ablation done by Dr. Johney Frame. He's done well since that time. Also while hospitalized in November, he was found to have an abnormal CT scan of the chest with bilateral upper lobe ground glass opacities. Also some question of pulmonary hypertension. This was not thought to be consistent with sarcoidosis. He had negative CT of the abdomen and pelvis. He had significant weakness in his upper extremities bilaterally but that has improved considerably with rehabilitation and physical therapy.  He is here today with URI symptoms. His son has had a recent URI. Patient says he's been having runny nose and congestion for about 2 weeks. Denies fever or chills. No significant cough now but did have a cough a couple of weeks ago. Main complaint is runny nose for 2 weeks. No sore throat. No ear pain. No sputum production.    Review of Systems     Objective:   Physical Exam HEENT exam: Slightly boggy nasal mucosa; TMs are chronically scarred; pharynx is clear; neck is supple without adenopathy. Chest clear        Assessment & Plan:  URI  History of atrial flutter status post ablation  History of abnormal chest CT with bilateral groundglass opacities  History of recent episode cervical myopathy November 2012-improved  Plan: Augmentin 500 mg by mouth 3 times daily for 10 days. May take Claritin over-the-counter. Appointment with pulmonologist regarding abnormal  chest CT.

## 2011-08-29 ENCOUNTER — Telehealth: Payer: Self-pay

## 2011-08-29 NOTE — Telephone Encounter (Signed)
Patient scheduled for an appointment with Dr. Carolin Sicks on 09/19/2011 at 2:30pm. Notified of this date and time.

## 2011-08-31 ENCOUNTER — Encounter: Payer: Self-pay | Admitting: Internal Medicine

## 2011-08-31 ENCOUNTER — Ambulatory Visit (INDEPENDENT_AMBULATORY_CARE_PROVIDER_SITE_OTHER): Payer: 59 | Admitting: Internal Medicine

## 2011-08-31 VITALS — BP 116/54 | HR 49 | Wt 132.8 lb

## 2011-08-31 DIAGNOSIS — I4892 Unspecified atrial flutter: Secondary | ICD-10-CM | POA: Diagnosis not present

## 2011-08-31 NOTE — Progress Notes (Signed)
PCP:  [name], MD, MD Primary Cardiologist:  Dr Alanda Amass  The patient presents today for routine electrophysiology followup.  Since his recent ablation, the patient reports doing very well.  He denies procedure related complications and is pleased with his results.  Today, he denies symptoms of palpitations, chest pain, shortness of breath, orthopnea, PND, lower extremity edema, dizziness, presyncope, syncope, or neurologic sequela.  The patient feels that he is tolerating medications without difficulties and is otherwise without complaint today.   Past Medical History  Diagnosis Date  . GERD (gastroesophageal reflux disease)   . Dysphagia     with solids  . Hypertension   . Esophageal stricture   . Diverticulosis   . Osteopenia   . Hemorrhoids   . High cholesterol   . Heart murmur   . Stroke 2012    "left me weaker on left side; balance is not good"  . Atrial flutter     typical appearing by ekg. s/p ablation 07/28/11  . Atrial fibrillation     remotely   . Sinus bradycardia     asymptomatic  . H/O hiatal hernia   . Arthritis     "right hip; right knee; lower back ~ 1/2 way across"  . Seizures     "back w/migraine headaches; 1990's or before"   Past Surgical History  Procedure Date  . Asd repair 1972    "and repaired my aortic valve, I think"  . Knee arthroscopy     right; "maybe twice"  . Inguinal hernia repair 2011    right  . Cardiac electrophysiology study and ablation 07/28/11    Current Outpatient Prescriptions  Medication Sig Dispense Refill  . aspirin 81 MG tablet Take 81 mg by mouth daily.      Marland Kitchen atorvastatin (LIPITOR) 40 MG tablet Take 40 mg by mouth daily.      . Cholecalciferol (VITAMIN D3) 1000 UNITS CAPS Take 1,000 Units by mouth daily.       Marland Kitchen doxazosin (CARDURA) 4 MG tablet Take 4 mg by mouth every evening.       . ferrous sulfate 325 (65 FE) MG tablet Take 325 mg by mouth daily with breakfast.      . HYDROcodone-acetaminophen (VICODIN) 5-500 MG  per tablet Take 1 tablet by mouth Every 6 hours while awake. For pain      . Multiple Vitamins-Minerals (MULTIVITAMINS THER. W/MINERALS) TABS Take 1 tablet by mouth daily.        . pantoprazole (PROTONIX) 40 MG tablet Take 40 mg by mouth every evening.       . phenytoin (DILANTIN) 100 MG ER capsule Take 300 mg by mouth at bedtime.       . traMADol (ULTRAM) 50 MG tablet Take 100 mg by mouth every 8 (eight) hours as needed. For pain Maximum dose= 8 tablets per day       . warfarin (COUMADIN) 5 MG tablet Take 5 mg by mouth daily. 1.5 tablets on mondays, wednesdays and fridays. Take 1 tablet the rest of the week.        No Known Allergies  History   Social History  . Marital Status: Married    Spouse Name: N/A    Number of Children: N/A  . Years of Education: N/A   Occupational History  . Not on file.   Social History Main Topics  . Smoking status: Former Smoker    Types: Cigarettes    Quit date: 06/12/1965  . Smokeless tobacco: Never  Used  . Alcohol Use: 0.0 oz/week     07/28/11 "1/5 will last me a year; w/company"  . Drug Use: No  . Sexually Active: Not Currently   Other Topics Concern  . Not on file   Social History Narrative  . No narrative on file    Family History  Problem Relation Age of Onset  . Heart disease Father    Physical Exam: Filed Vitals:   08/31/11 0919  BP: 116/54  Pulse: 49  Weight: 132 lb 12.8 oz (60.238 kg)    GEN- The patient is well appearing, alert and oriented x 3 today.   Head- normocephalic, atraumatic Eyes-  Sclera clear, conjunctiva pink Ears- hearing intact Oropharynx- clear Neck- supple, no JVP Lymph- no cervical lymphadenopathy Lungs- Clear to ausculation bilaterally, normal work of breathing Heart- Regular rate and rhythm, no murmurs, rubs or gallops, PMI not laterally displaced GI- soft, NT, ND, + BS Extremities- no clubbing, cyanosis, or edema   ekg today reveals sinus bradycardia 50 bpm, PR 276, LVH, otherwise normal  ekg  Assessment and Plan:

## 2011-08-31 NOTE — Assessment & Plan Note (Signed)
Doing well s/p ablation without recurrence  Given prior concerns for an embolic event, I will not stop coumadin today.  I will defer long term anticoagulation to Dr Sandria Manly and Dr Alanda Amass. I will see as needed.

## 2011-08-31 NOTE — Patient Instructions (Signed)
Your physician recommends that you schedule a follow-up appointment as needed with Dr Johney Frame   Keep your follow up as scheduled with Dr Alanda Amass

## 2011-09-19 ENCOUNTER — Ambulatory Visit (INDEPENDENT_AMBULATORY_CARE_PROVIDER_SITE_OTHER): Payer: 59 | Admitting: Pulmonary Disease

## 2011-09-19 ENCOUNTER — Encounter: Payer: Self-pay | Admitting: Pulmonary Disease

## 2011-09-19 VITALS — BP 130/78 | HR 52 | Temp 98.7°F | Ht 69.0 in | Wt 132.0 lb

## 2011-09-19 DIAGNOSIS — R918 Other nonspecific abnormal finding of lung field: Secondary | ICD-10-CM | POA: Insufficient documentation

## 2011-09-19 NOTE — Progress Notes (Signed)
  Subjective:    Patient ID: Jay Walters, male    DOB: 12/01/1931, 78 y.o.   MRN: [id number]  HPI The patient is a 78 year old male who I've been asked to see for an abnormal CT chest.  The patient states he was diagnosed with a stroke around Thanksgiving, and was ultimately found on CT chest to have bilateral upper lobe groundglass opacities.  There was also a mention of "chronic lung disease", but the patient has never smoked.  There was also a mention of pulmonary hypertension, however a recent echo showed normal PA pressures.  The patient has had a followup scan last month which showed no change in his abnormalities.  The patient denies any breathing issues, cough, or congestion.  He has no history of tuberculosis exposure, however has not had a PPD to his knowledge.  He has never lived in [place] or [place] for any period of time.  He does have a history of esophageal issues, but denies any symptoms of aspiration.  He has an occasional episode of regurgitation of food while eating, but this only occurs 2-3 times a year at the most.  The patient denies any personal history of cancer.   Review of Systems  Constitutional: Positive for unexpected weight change. Negative for fever.  HENT: Negative for ear pain, nosebleeds, congestion, sore throat, rhinorrhea, sneezing, trouble swallowing, dental problem, postnasal drip and sinus pressure.   Eyes: Negative for redness and itching.  Respiratory: Negative for cough, chest tightness, shortness of breath and wheezing.   Cardiovascular: Positive for palpitations. Negative for leg swelling.  Gastrointestinal: Negative for nausea and vomiting.  Genitourinary: Negative for dysuria.  Musculoskeletal: Negative for joint swelling.  Skin: Negative for rash.  Neurological: Negative for headaches.  Hematological: Does not bruise/bleed easily.  Psychiatric/Behavioral: Negative for dysphoric mood. The patient is not nervous/anxious.        Objective:   Physical Exam Constitutional:  Well developed, no acute distress  HENT:  Nares patent without discharge  Oropharynx without exudate, palate and uvula are normal  Eyes:  Perrla, eomi, no scleral icterus  Neck:  No JVD, no TMG  Cardiovascular:  Normal rate, regular rhythm, no rubs or gallops.  No murmurs        Intact distal pulses  Pulmonary :  Normal breath sounds, no stridor or respiratory distress   No rales, rhonchi, or wheezing  Abdominal:  Soft, nondistended, bowel sounds present.  No tenderness noted.   Musculoskeletal:  No lower extremity edema noted.  Lymph Nodes:  No cervical lymphadenopathy noted  Skin:  No cyanosis noted  Neurologic:  Alert, appropriate, moves all 4 extremities with mild weakness/abnormal gait.         Assessment & Plan:

## 2011-09-19 NOTE — Assessment & Plan Note (Signed)
The patient has bilateral upper lobe groundglass opacities that are very faint.  He is in a very low risk group with no smoking history or personal history of cancer.  These are typically inflammatory, infectious, or malignant.  Bronchoalveolar carcinoma can present in this fashion, and can be slow growing over many years.  We have most recently come to recognize this and will typically follow these closely.  The only treatment options here are surveillance, biopsy, or surgical resection.  Given his very low risk, I think we should just follow these over a period of time.  He is agreeable to this approach.  His CT chest also mentions "chronic lung disease", but there is only minimal emphysema here which is probably related to his age.  He denies any breathing issues currently.  A question of pulmonary hypertension is also mentioned, however his echo in November of last year showed normal pulmonary artery pressures.

## 2011-09-19 NOTE — Patient Instructions (Signed)
Will schedule for a followup scan in Nov of this year.  Will call you with results. I will put in computer to schedule, but please mark on your calendar for Nov as well. Please call if any change in your health status.

## 2011-10-02 ENCOUNTER — Encounter: Payer: 59 | Admitting: Internal Medicine

## 2012-03-18 ENCOUNTER — Ambulatory Visit (INDEPENDENT_AMBULATORY_CARE_PROVIDER_SITE_OTHER): Payer: 59 | Admitting: Internal Medicine

## 2012-03-18 ENCOUNTER — Encounter: Payer: Self-pay | Admitting: Internal Medicine

## 2012-03-18 VITALS — BP 124/66 | HR 58 | Temp 98.0°F | Ht 69.0 in | Wt 133.0 lb

## 2012-03-18 DIAGNOSIS — Z23 Encounter for immunization: Secondary | ICD-10-CM

## 2012-03-18 DIAGNOSIS — M1711 Unilateral primary osteoarthritis, right knee: Secondary | ICD-10-CM

## 2012-03-18 DIAGNOSIS — M1710 Unilateral primary osteoarthritis, unspecified knee: Secondary | ICD-10-CM

## 2012-03-18 DIAGNOSIS — Z87898 Personal history of other specified conditions: Secondary | ICD-10-CM

## 2012-03-18 DIAGNOSIS — IMO0002 Reserved for concepts with insufficient information to code with codable children: Secondary | ICD-10-CM

## 2012-03-18 DIAGNOSIS — Z8709 Personal history of other diseases of the respiratory system: Secondary | ICD-10-CM

## 2012-03-18 DIAGNOSIS — M5000 Cervical disc disorder with myelopathy, unspecified cervical region: Secondary | ICD-10-CM

## 2012-03-18 DIAGNOSIS — Z8679 Personal history of other diseases of the circulatory system: Secondary | ICD-10-CM

## 2012-03-18 DIAGNOSIS — J309 Allergic rhinitis, unspecified: Secondary | ICD-10-CM

## 2012-03-18 DIAGNOSIS — Z8669 Personal history of other diseases of the nervous system and sense organs: Secondary | ICD-10-CM

## 2012-04-08 ENCOUNTER — Encounter: Payer: Self-pay | Admitting: Gastroenterology

## 2012-04-16 ENCOUNTER — Ambulatory Visit (INDEPENDENT_AMBULATORY_CARE_PROVIDER_SITE_OTHER)
Admission: RE | Admit: 2012-04-16 | Discharge: 2012-04-16 | Disposition: A | Discharge status: Home / Self Care | Payer: 59 | Source: Ambulatory Visit | Attending: Pulmonary Disease | Admitting: Pulmonary Disease

## 2012-04-16 DIAGNOSIS — R918 Other nonspecific abnormal finding of lung field: Secondary | ICD-10-CM

## 2012-04-22 ENCOUNTER — Encounter: Payer: Self-pay | Admitting: Internal Medicine

## 2012-04-22 ENCOUNTER — Ambulatory Visit (INDEPENDENT_AMBULATORY_CARE_PROVIDER_SITE_OTHER): Payer: 59 | Admitting: Internal Medicine

## 2012-04-22 VITALS — BP 106/58 | HR 56 | Temp 98.3°F | Wt 131.5 lb

## 2012-04-22 DIAGNOSIS — J40 Bronchitis, not specified as acute or chronic: Secondary | ICD-10-CM | POA: Diagnosis not present

## 2012-04-22 NOTE — Progress Notes (Signed)
  Subjective:    Patient ID: Jay Walters, male    DOB: 1932-05-08, 78 y.o.   MRN: [id number]  HPI Onset of URI symptoms a few days ago. No fever or chills. Began to get up discolored sputum yesterday. Has malaise and fatigue. Some nasal congestion. Took some over-the-counter cough medication with some relief.    Review of Systems     Objective:   Physical Exam HEENT exam: Pharynx slightly injected without exudate. TMs are dull and chronically scarred bilaterally. Neck is supple without adenopathy. Chest: Patient has rhonchi left chest that clears with coughing and occasional scattered wheeze in left chest- no rales appreciated. Skin is warm and dry.        Assessment & Plan:  Bronchitis  Plan: Levaquin 500 milligrams daily for 7 days with one refill if not better in 7 days have prescription refilled. Hycodan 8 ounces 1 teaspoon by mouth every 8 hours when necessary cough with no refill. Call if not better in 7 days or sooner if worse.

## 2012-04-22 NOTE — Patient Instructions (Addendum)
Take Levaquin 500 milligrams daily for 7 days. Have prescription refill if not better in 7 days. Take Hycodan sparingly for cough. Rest and drink plenty of fluids.

## 2012-04-26 ENCOUNTER — Ambulatory Visit: Payer: 59 | Admitting: Pulmonary Disease

## 2012-05-03 ENCOUNTER — Ambulatory Visit: Payer: 59 | Admitting: Pulmonary Disease

## 2012-05-31 ENCOUNTER — Telehealth: Payer: Self-pay | Admitting: Internal Medicine

## 2012-05-31 NOTE — Telephone Encounter (Signed)
Pt stated that he wanted to know what rheumatologist Dr. Lenord Fellers recommends him see for arthritis pain in right knee and right shoulder.

## 2012-06-06 NOTE — Telephone Encounter (Signed)
Patient advised, per Dr. Lenord Fellers that he should return to orthopedist rt knee, hip and shoulder pain. May want to try more conservative treatments before doing a knee replacement. He is agreeable to this.

## 2012-07-03 ENCOUNTER — Other Ambulatory Visit (HOSPITAL_COMMUNITY): Payer: Self-pay | Admitting: Orthopedic Surgery

## 2012-07-03 ENCOUNTER — Encounter (HOSPITAL_COMMUNITY): Payer: Self-pay | Admitting: Pharmacy Technician

## 2012-07-04 ENCOUNTER — Encounter (HOSPITAL_COMMUNITY): Payer: Self-pay

## 2012-07-04 ENCOUNTER — Ambulatory Visit (HOSPITAL_COMMUNITY)
Admission: RE | Admit: 2012-07-04 | Discharge: 2012-07-04 | Disposition: A | Discharge status: Home / Self Care | Payer: 59 | Source: Ambulatory Visit | Attending: Surgical | Admitting: Surgical

## 2012-07-04 ENCOUNTER — Encounter (HOSPITAL_COMMUNITY)
Admission: RE | Admit: 2012-07-04 | Discharge: 2012-07-04 | Disposition: A | Discharge status: Home / Self Care | Payer: 59 | Source: Ambulatory Visit | Attending: Orthopedic Surgery | Admitting: Orthopedic Surgery

## 2012-07-04 DIAGNOSIS — Z01818 Encounter for other preprocedural examination: Secondary | ICD-10-CM | POA: Diagnosis not present

## 2012-07-04 DIAGNOSIS — I77819 Aortic ectasia, unspecified site: Secondary | ICD-10-CM | POA: Diagnosis not present

## 2012-07-04 DIAGNOSIS — Z01812 Encounter for preprocedural laboratory examination: Secondary | ICD-10-CM | POA: Diagnosis not present

## 2012-07-04 DIAGNOSIS — I517 Cardiomegaly: Secondary | ICD-10-CM | POA: Insufficient documentation

## 2012-07-04 DIAGNOSIS — M4120 Other idiopathic scoliosis, site unspecified: Secondary | ICD-10-CM | POA: Diagnosis not present

## 2012-07-04 DIAGNOSIS — I10 Essential (primary) hypertension: Secondary | ICD-10-CM | POA: Insufficient documentation

## 2012-07-04 HISTORY — DX: Anemia, unspecified: D64.9

## 2012-07-04 LAB — URINALYSIS, ROUTINE W REFLEX MICROSCOPIC
Bilirubin Urine: NEGATIVE
Glucose, UA: NEGATIVE mg/dL
Hgb urine dipstick: NEGATIVE
Ketones, ur: NEGATIVE mg/dL
Leukocytes, UA: NEGATIVE
Nitrite: NEGATIVE
Protein, ur: NEGATIVE mg/dL
Specific Gravity, Urine: 1.022 (ref 1.005–1.030)
Urobilinogen, UA: 0.2 mg/dL (ref 0.0–1.0)
pH: 6 (ref 5.0–8.0)

## 2012-07-04 LAB — PROTIME-INR
INR: 3.1 — ABNORMAL HIGH (ref 0.00–1.49)
Prothrombin Time: 30.3 seconds — ABNORMAL HIGH (ref 11.6–15.2)

## 2012-07-04 LAB — CBC
HCT: 40.3 % (ref 39.0–52.0)
Platelets: 175 10*3/uL (ref 150–400)
RDW: 13.8 % (ref 11.5–15.5)
WBC: 2.8 10*3/uL — ABNORMAL LOW (ref 4.0–10.5)

## 2012-07-04 LAB — COMPREHENSIVE METABOLIC PANEL
ALT: 32 U/L (ref 0–53)
AST: 24 U/L (ref 0–37)
Albumin: 3.4 g/dL — ABNORMAL LOW (ref 3.5–5.2)
Alkaline Phosphatase: 56 U/L (ref 39–117)
BUN: 18 mg/dL (ref 6–23)
CO2: 32 mEq/L (ref 19–32)
Calcium: 9.2 mg/dL (ref 8.4–10.5)
Chloride: 101 mEq/L (ref 96–112)
Creatinine, Ser: 0.88 mg/dL (ref 0.50–1.35)
GFR calc Af Amer: >90 mL/min (ref >90)
GFR calc non Af Amer: 80 mL/min — ABNORMAL LOW (ref >90)
Glucose, Bld: 80 mg/dL (ref 70–99)
Potassium: 4.2 mEq/L (ref 3.5–5.1)
Sodium: 139 mEq/L (ref 135–145)
Total Bilirubin: 0.2 mg/dL — ABNORMAL LOW (ref 0.3–1.2)
Total Protein: 7.1 g/dL (ref 6.0–8.3)

## 2012-07-04 LAB — SURGICAL PCR SCREEN: Staphylococcus aureus: NEGATIVE

## 2012-07-04 LAB — APTT: aPTT: 39 seconds — ABNORMAL HIGH (ref 24–37)

## 2012-07-04 NOTE — Patient Instructions (Addendum)
[address]  07/04/2012   Your procedure is scheduled on: 07-10-2012  Report to [facility] at 0800 AM.  Call this number if you have problems the morning of surgery [phone]   Remember:   Do not eat food or drink liquids :After Midnight.     Take these medicines the morning of surgery with A SIP OF WATER: no meds to take                                SEE [facility] PREPARING FOR SURGERY SHEET   Do not wear jewelry, make-up or nail polish.  Do not wear lotions, powders, or perfumes. You may wear deodorant.   Men may shave face and neck.  Do not bring valuables to the hospital.  Contacts, dentures or bridgework may not be worn into surgery.  Leave suitcase in the car. After surgery it may be brought to your room.  For patients admitted to the hospital, checkout time is 11:00 AM the day of discharge.   Patients discharged the day of surgery will not be allowed to drive home.  Name and phone number of your driver:  Special Instructions: N/A   Please read over the following fact sheets that you were given: MRSA Information, blood fact sheet, incentive spirometer fact sheet, knee replacement booklet  Call Cain Sieve RN pre op nurse if needed [phone]    FAILURE TO FOLLOW THESE INSTRUCTIONS MAY RESULT IN THE CANCELLATION OF YOUR SURGERY. PATIENT SIGNATURE___________________________________________

## 2012-07-04 NOTE — Progress Notes (Signed)
Cbc results faxed by epic to dr Darrelyn Hillock ordered repeat cbc day of surgery per anesthesia guidelines

## 2012-07-04 NOTE — Progress Notes (Addendum)
Echo 05-09-11 epic ekg 08-25-11 epic Chest ct 04-18-12 abnormal epic, will repeat chest xray with pre op visit today. lov dr clance 09-19-11 epic

## 2012-07-09 MED ORDER — CEFAZOLIN SODIUM-DEXTROSE 2-3 GM-% IV SOLR
2.0000 g | INTRAVENOUS | Status: AC
  Administered 2012-07-10: 2 g via INTRAVENOUS

## 2012-07-09 NOTE — H&P (Signed)
TOTAL KNEE ADMISSION H&P  Patient is being admitted for right total knee arthroplasty.  Subjective:  Chief Complaint:right knee pain.  HPI: Jay Walters, 79 y.o. male, has a history of pain and functional disability in the right knee due to arthritis and has failed non-surgical conservative treatments for greater than 12 weeks to includeNSAID's and/or analgesics, corticosteriod injections, flexibility and strengthening excercises and activity modification.  Onset of symptoms was gradual, starting 5 years ago with gradually worsening course since that time. The patient noted no past surgery on the right knee(s).  Patient currently rates pain in the right knee(s) at 6 out of 10 with activity. Patient has night pain, worsening of pain with activity and weight bearing, pain that interferes with activities of daily living, pain with passive range of motion and crepitus.  Patient has evidence of periarticular osteophytes and joint space narrowing by imaging studies.  There is no active infection.  Patient Active Problem List   Diagnosis Date Noted  . Pulmonary nodules 09/19/2011  . Atrial flutter 08/28/2011  . Allergic rhinitis 08/28/2011  . Abnormal chest CT 08/28/2011  . S/P atrial septal defect closure, surgical 05/09/2011  . Seizure disorder 04/18/2011  . BPH (benign prostatic hyperplasia) 04/18/2011  . Migraine headache 04/18/2011  . Osteopenia 04/18/2011  . Vitamin d deficiency 04/18/2011  . Osteoarthritis of right knee 04/18/2011  . GERD 08/16/2010  . DYSPHAGIA 08/16/2010   Past Medical History  Diagnosis Date  . Dysphagia     with solids  . Esophageal stricture   . Diverticulosis   . Osteopenia   . Hemorrhoids   . High cholesterol   . Heart murmur   . H/O hiatal hernia   . Hypertension   . Atrial flutter     typical appearing by ekg. s/p ablation 07/28/11  . Atrial fibrillation     remotely   . Sinus bradycardia     asymptomatic  . Stroke 2012    "left me weaker on left  side; balance is not good"  . GERD (gastroesophageal reflux disease)   . Seizures     "back w/migraine headaches; 1990's or before"  . Arthritis     "right hip; right knee; lower back ~ 1/2 way across"  . Anemia     Past Surgical History  Procedure Date  . Asd repair 1972    "and repaired my aortic valve, I think"  . Knee arthroscopy     right; "maybe twice"  . Inguinal hernia repair 2011    right  . Cardiac electrophysiology study and ablation 07/28/11    No prescriptions prior to admission   No Known Allergies  History  Substance Use Topics  . Smoking status: Never Smoker   . Smokeless tobacco: Never Used  . Alcohol Use: 0.0 oz/week     Comment: 07/28/11 "1/5 will last me a year; w/company"    Family History  Problem Relation Age of Onset  . Heart disease Father   . Cancer Sister     unsure what type     Review of Systems  Constitutional: Negative.   HENT: Negative.  Negative for neck pain.   Eyes: Negative.   Respiratory: Negative.   Cardiovascular: Negative.   Gastrointestinal: Negative.   Genitourinary: Negative.   Musculoskeletal: Positive for back pain and joint pain. Negative for myalgias and falls.       Right knee pain  Skin: Negative.   Neurological: Negative.   Endo/Heme/Allergies: Negative.   Psychiatric/Behavioral: Negative.  Objective:  Physical Exam  Constitutional: He is oriented to person, place, and time. He appears well-developed and well-nourished. No distress.  HENT:  Head: Normocephalic and atraumatic.  Right Ear: External ear normal.  Left Ear: External ear normal.  Nose: Nose normal.  Mouth/Throat: Oropharynx is clear and moist.  Eyes: Conjunctivae normal and EOM are normal.  Neck: Normal range of motion. Neck supple. No tracheal deviation present. No thyromegaly present.  Cardiovascular: Normal rate, regular rhythm, normal heart sounds and intact distal pulses.   No murmur heard. Respiratory: Effort normal and breath sounds  normal. No respiratory distress. He has no wheezes. He exhibits no tenderness.  GI: Soft. Bowel sounds are normal. He exhibits no distension and no mass. There is no tenderness.  Musculoskeletal:       Right hip: Normal.       Left hip: Normal.       Right knee: He exhibits decreased range of motion and swelling. He exhibits no erythema. tenderness found. Medial joint line tenderness noted.       Left knee: Normal.       Right lower leg: He exhibits no tenderness and no swelling.       Left lower leg: He exhibits no tenderness and no swelling.  Lymphadenopathy:    He has no cervical adenopathy.  Neurological: He is alert and oriented to person, place, and time. He has normal strength and normal reflexes. No sensory deficit.  Skin: No rash noted. He is not diaphoretic. No erythema.  Psychiatric: He has a normal mood and affect. His behavior is normal.   Vitals Weight: 133 lb Height: 69 in Body Surface Area: 1.71 m Body Mass Index: 19.64 kg/m BP: 160/78 (Sitting, Left Arm, Standard)   Estimated Body mass index is 19.42 kg/(m^2) as calculated from the following:   Height as of 03/18/12: 5\' 9" (1.753 m).   Weight as of 04/22/12: 131 lb 8 oz(59.648 kg).   Imaging Review Plain radiographs demonstrate severe degenerative joint disease of the right knee(s). The overall alignment ismild varus. The bone quality appears to be good for age and reported activity level.  Assessment/Plan:  End stage arthritis, right knee   The patient history, physical examination, clinical judgment of the provider and imaging studies are consistent with end stage degenerative joint disease of the right knee(s) and total knee arthroplasty is deemed medically necessary. The treatment options including medical management, injection therapy arthroscopy and arthroplasty were discussed at length. The risks and benefits of total knee arthroplasty were presented and reviewed. The risks due to aseptic loosening,  infection, stiffness, patella tracking problems, thromboembolic complications and other imponderables were discussed. The patient acknowledged the explanation, agreed to proceed with the plan and consent was signed. Patient is being admitted for inpatient treatment for surgery, pain control, PT, OT, prophylactic antibiotics, VTE prophylaxis, progressive ambulation and ADL's and discharge planning. The patient is planning to be discharged home with home health services     [place], [place]

## 2012-07-10 ENCOUNTER — Inpatient Hospital Stay (HOSPITAL_COMMUNITY)
Admission: RE | Admit: 2012-07-10 | Discharge: 2012-07-15 | DRG: 470 | Disposition: A | Discharge status: Skilled Nursing Facility | Payer: 59 | Source: Ambulatory Visit | Attending: Orthopedic Surgery | Admitting: Orthopedic Surgery

## 2012-07-10 ENCOUNTER — Encounter (HOSPITAL_COMMUNITY)
Admission: RE | Disposition: A | Discharge status: Skilled Nursing Facility | Payer: Self-pay | Source: Ambulatory Visit | Attending: Orthopedic Surgery

## 2012-07-10 ENCOUNTER — Inpatient Hospital Stay (HOSPITAL_COMMUNITY): Payer: 59

## 2012-07-10 ENCOUNTER — Encounter (HOSPITAL_COMMUNITY): Payer: Self-pay | Admitting: Surgery

## 2012-07-10 ENCOUNTER — Inpatient Hospital Stay (HOSPITAL_COMMUNITY): Payer: 59 | Admitting: Certified Registered Nurse Anesthetist

## 2012-07-10 ENCOUNTER — Encounter (HOSPITAL_COMMUNITY): Payer: Self-pay | Admitting: Certified Registered Nurse Anesthetist

## 2012-07-10 ENCOUNTER — Encounter (HOSPITAL_COMMUNITY): Payer: Self-pay | Admitting: *Deleted

## 2012-07-10 DIAGNOSIS — I4891 Unspecified atrial fibrillation: Secondary | ICD-10-CM | POA: Diagnosis present

## 2012-07-10 DIAGNOSIS — M25569 Pain in unspecified knee: Secondary | ICD-10-CM | POA: Diagnosis not present

## 2012-07-10 DIAGNOSIS — M1710 Unilateral primary osteoarthritis, unspecified knee: Secondary | ICD-10-CM | POA: Diagnosis present

## 2012-07-10 DIAGNOSIS — I69998 Other sequelae following unspecified cerebrovascular disease: Secondary | ICD-10-CM

## 2012-07-10 DIAGNOSIS — K449 Diaphragmatic hernia without obstruction or gangrene: Secondary | ICD-10-CM | POA: Diagnosis present

## 2012-07-10 DIAGNOSIS — Z471 Aftercare following joint replacement surgery: Secondary | ICD-10-CM | POA: Diagnosis not present

## 2012-07-10 DIAGNOSIS — D62 Acute posthemorrhagic anemia: Secondary | ICD-10-CM | POA: Diagnosis not present

## 2012-07-10 DIAGNOSIS — I10 Essential (primary) hypertension: Secondary | ICD-10-CM | POA: Diagnosis present

## 2012-07-10 DIAGNOSIS — Z96659 Presence of unspecified artificial knee joint: Secondary | ICD-10-CM | POA: Diagnosis not present

## 2012-07-10 DIAGNOSIS — R5383 Other fatigue: Secondary | ICD-10-CM | POA: Diagnosis present

## 2012-07-10 DIAGNOSIS — M24569 Contracture, unspecified knee: Secondary | ICD-10-CM | POA: Diagnosis present

## 2012-07-10 DIAGNOSIS — R5381 Other malaise: Secondary | ICD-10-CM | POA: Diagnosis present

## 2012-07-10 HISTORY — PX: TOTAL KNEE ARTHROPLASTY: SHX125

## 2012-07-10 LAB — CBC
HCT: 34 % — ABNORMAL LOW (ref 39.0–52.0)
Hemoglobin: 12.3 g/dL — ABNORMAL LOW (ref 13.0–17.0)
MCH: 31.2 pg (ref 26.0–34.0)
MCHC: 33.8 g/dL (ref 30.0–36.0)
MCV: 91.6 fL (ref 78.0–100.0)
MCV: 92.4 fL (ref 78.0–100.0)
RBC: 3.94 MIL/uL — ABNORMAL LOW (ref 4.22–5.81)
RDW: 13.4 % (ref 11.5–15.5)
WBC: 2.9 10*3/uL — ABNORMAL LOW (ref 4.0–10.5)
WBC: 6 10*3/uL (ref 4.0–10.5)

## 2012-07-10 LAB — CREATININE, SERUM: GFR calc Af Amer: >90 mL/min (ref >90)

## 2012-07-10 LAB — ABO/RH: ABO/RH(D): A POS

## 2012-07-10 SURGERY — ARTHROPLASTY, KNEE, TOTAL
Anesthesia: General | Site: Knee | Laterality: Right | Wound class: Clean

## 2012-07-10 MED ORDER — BISACODYL 10 MG RE SUPP: 10.0000 mg | Freq: Every day | RECTAL | Status: DC | PRN

## 2012-07-10 MED ORDER — MEPERIDINE HCL 50 MG/ML IJ SOLN: 6.2500 mg | INTRAMUSCULAR | Status: DC | PRN

## 2012-07-10 MED ORDER — LACTATED RINGERS IV SOLN: INTRAVENOUS | Status: DC

## 2012-07-10 MED ORDER — METHOCARBAMOL 500 MG PO TABS
500.0000 mg | ORAL_TABLET | Freq: Four times a day (QID) | ORAL | Status: DC | PRN
  Administered 2012-07-10 – 2012-07-12 (×4): 500 mg via ORAL
  Filled 2012-07-10 (×4): qty 1

## 2012-07-10 MED ORDER — ACETAMINOPHEN 325 MG PO TABS
650.0000 mg | ORAL_TABLET | Freq: Four times a day (QID) | ORAL | Status: DC | PRN
  Administered 2012-07-10 – 2012-07-13 (×4): 650 mg via ORAL
  Filled 2012-07-10 (×5): qty 2

## 2012-07-10 MED ORDER — HYDROCODONE-ACETAMINOPHEN 5-325 MG PO TABS
1.0000 | ORAL_TABLET | ORAL | Status: DC | PRN
  Administered 2012-07-12 – 2012-07-13 (×2): 1 via ORAL
  Administered 2012-07-14 (×2): 2 via ORAL
  Administered 2012-07-15: 1 via ORAL
  Filled 2012-07-10: qty 1
  Filled 2012-07-10: qty 2
  Filled 2012-07-10 (×2): qty 1
  Filled 2012-07-10: qty 2

## 2012-07-10 MED ORDER — ACETAMINOPHEN 650 MG RE SUPP: 650.0000 mg | Freq: Four times a day (QID) | RECTAL | Status: DC | PRN

## 2012-07-10 MED ORDER — MENTHOL 3 MG MT LOZG: 1.0000 | LOZENGE | OROMUCOSAL | Status: DC | PRN

## 2012-07-10 MED ORDER — POLYETHYLENE GLYCOL 3350 17 G PO PACK
17.0000 g | PACK | Freq: Every day | ORAL | Status: DC | PRN
  Administered 2012-07-14: 17 g via ORAL

## 2012-07-10 MED ORDER — ALUM & MAG HYDROXIDE-SIMETH 200-200-20 MG/5ML PO SUSP: 30.0000 mL | ORAL | Status: DC | PRN

## 2012-07-10 MED ORDER — FENTANYL CITRATE 0.05 MG/ML IJ SOLN
INTRAMUSCULAR | Status: DC | PRN
  Administered 2012-07-10 (×2): 50 ug via INTRAVENOUS
  Administered 2012-07-10: 100 ug via INTRAVENOUS
  Administered 2012-07-10: 50 ug via INTRAVENOUS

## 2012-07-10 MED ORDER — OXYCODONE-ACETAMINOPHEN 5-325 MG PO TABS
1.0000 | ORAL_TABLET | ORAL | Status: DC | PRN
  Administered 2012-07-10 – 2012-07-14 (×6): 1 via ORAL
  Filled 2012-07-10 (×7): qty 1

## 2012-07-10 MED ORDER — ONDANSETRON HCL 4 MG PO TABS: 4.0000 mg | ORAL_TABLET | Freq: Four times a day (QID) | ORAL | Status: DC | PRN

## 2012-07-10 MED ORDER — SODIUM CHLORIDE 0.9 % IR SOLN
Status: DC | PRN
  Administered 2012-07-10: 11:00:00

## 2012-07-10 MED ORDER — CEFAZOLIN SODIUM 1-5 GM-% IV SOLN
1.0000 g | Freq: Four times a day (QID) | INTRAVENOUS | Status: AC
  Administered 2012-07-10 (×2): 1 g via INTRAVENOUS
  Filled 2012-07-10 (×2): qty 50

## 2012-07-10 MED ORDER — DOXAZOSIN MESYLATE 4 MG PO TABS
4.0000 mg | ORAL_TABLET | Freq: Every evening | ORAL | Status: DC
  Administered 2012-07-11 – 2012-07-14 (×4): 4 mg via ORAL
  Filled 2012-07-10 (×6): qty 1

## 2012-07-10 MED ORDER — THROMBIN 5000 UNITS EX SOLR
OROMUCOSAL | Status: DC | PRN
  Administered 2012-07-10: 12:00:00 via TOPICAL

## 2012-07-10 MED ORDER — HYDROMORPHONE HCL PF 1 MG/ML IJ SOLN
0.2500 mg | INTRAMUSCULAR | Status: DC | PRN
  Administered 2012-07-10 (×2): 0.5 mg via INTRAVENOUS

## 2012-07-10 MED ORDER — SUCCINYLCHOLINE CHLORIDE 20 MG/ML IJ SOLN
INTRAMUSCULAR | Status: DC | PRN
  Administered 2012-07-10: 100 mg via INTRAVENOUS

## 2012-07-10 MED ORDER — LACTATED RINGERS IV SOLN
INTRAVENOUS | Status: DC
  Administered 2012-07-10 (×3): via INTRAVENOUS

## 2012-07-10 MED ORDER — PROMETHAZINE HCL 25 MG/ML IJ SOLN: 6.2500 mg | INTRAMUSCULAR | Status: DC | PRN

## 2012-07-10 MED ORDER — SODIUM CHLORIDE 0.9 % IJ SOLN
INTRAMUSCULAR | Status: DC | PRN
  Administered 2012-07-10: 20 mL

## 2012-07-10 MED ORDER — FLEET ENEMA 7-19 GM/118ML RE ENEM: 1.0000 | ENEMA | Freq: Once | RECTAL | Status: AC | PRN

## 2012-07-10 MED ORDER — BUPIVACAINE LIPOSOME 1.3 % IJ SUSP
20.0000 mL | Freq: Once | INTRAMUSCULAR | Status: AC
  Administered 2012-07-10: 20 mL
  Filled 2012-07-10: qty 20

## 2012-07-10 MED ORDER — WARFARIN SODIUM 7.5 MG PO TABS
7.5000 mg | ORAL_TABLET | Freq: Once | ORAL | Status: AC
  Administered 2012-07-10: 7.5 mg via ORAL
  Filled 2012-07-10: qty 1

## 2012-07-10 MED ORDER — FINASTERIDE 5 MG PO TABS
5.0000 mg | ORAL_TABLET | Freq: Every evening | ORAL | Status: DC
  Administered 2012-07-10 – 2012-07-14 (×5): 5 mg via ORAL
  Filled 2012-07-10 (×6): qty 1

## 2012-07-10 MED ORDER — HYDROMORPHONE HCL PF 1 MG/ML IJ SOLN
1.0000 mg | INTRAMUSCULAR | Status: DC | PRN
  Administered 2012-07-11: 1 mg via INTRAVENOUS
  Filled 2012-07-10: qty 1

## 2012-07-10 MED ORDER — ONDANSETRON HCL 4 MG/2ML IJ SOLN: 4.0000 mg | Freq: Four times a day (QID) | INTRAMUSCULAR | Status: DC | PRN

## 2012-07-10 MED ORDER — SODIUM CHLORIDE 0.9 % IR SOLN
Status: DC | PRN
  Administered 2012-07-10: 1000 mL

## 2012-07-10 MED ORDER — ONDANSETRON HCL 4 MG/2ML IJ SOLN
INTRAMUSCULAR | Status: DC | PRN
  Administered 2012-07-10: 4 mg via INTRAVENOUS

## 2012-07-10 MED ORDER — LACTATED RINGERS IV SOLN
INTRAVENOUS | Status: DC
  Administered 2012-07-10 – 2012-07-14 (×6): via INTRAVENOUS

## 2012-07-10 MED ORDER — EPHEDRINE SULFATE 50 MG/ML IJ SOLN
INTRAMUSCULAR | Status: DC | PRN
  Administered 2012-07-10: 10 mg via INTRAVENOUS

## 2012-07-10 MED ORDER — DIGOXIN 0.0625 MG HALF TABLET
0.0625 mg | ORAL_TABLET | ORAL | Status: DC
  Administered 2012-07-11 – 2012-07-15 (×3): 0.0625 mg via ORAL
  Filled 2012-07-10 (×3): qty 1

## 2012-07-10 MED ORDER — HEPARIN SODIUM (PORCINE) 5000 UNIT/ML IJ SOLN
5000.0000 [IU] | Freq: Three times a day (TID) | INTRAMUSCULAR | Status: DC
  Administered 2012-07-10 – 2012-07-12 (×5): 5000 [IU] via SUBCUTANEOUS
  Filled 2012-07-10 (×8): qty 1

## 2012-07-10 MED ORDER — PROPOFOL 10 MG/ML IV BOLUS
INTRAVENOUS | Status: DC | PRN
  Administered 2012-07-10: 150 mg via INTRAVENOUS

## 2012-07-10 MED ORDER — PHENYTOIN SODIUM EXTENDED 100 MG PO CAPS
300.0000 mg | ORAL_CAPSULE | Freq: Every day | ORAL | Status: DC
  Administered 2012-07-10 – 2012-07-14 (×5): 300 mg via ORAL
  Filled 2012-07-10 (×6): qty 3

## 2012-07-10 MED ORDER — METHOCARBAMOL 100 MG/ML IJ SOLN
500.0000 mg | Freq: Four times a day (QID) | INTRAVENOUS | Status: DC | PRN
  Administered 2012-07-10: 500 mg via INTRAVENOUS
  Filled 2012-07-10: qty 5

## 2012-07-10 MED ORDER — FERROUS SULFATE 325 (65 FE) MG PO TABS
325.0000 mg | ORAL_TABLET | Freq: Three times a day (TID) | ORAL | Status: DC
  Administered 2012-07-10 – 2012-07-15 (×13): 325 mg via ORAL
  Filled 2012-07-10 (×17): qty 1

## 2012-07-10 MED ORDER — WARFARIN - PHARMACIST DOSING INPATIENT: Freq: Every day | Status: DC

## 2012-07-10 MED ORDER — PHENOL 1.4 % MT LIQD: 1.0000 | OROMUCOSAL | Status: DC | PRN

## 2012-07-10 SURGICAL SUPPLY — 67 items
BAG SPEC THK2 15X12 ZIP CLS (MISCELLANEOUS) ×1
BAG ZIPLOCK 12X15 (MISCELLANEOUS) ×2 IMPLANT
BANDAGE ELASTIC 4 VELCRO ST LF (GAUZE/BANDAGES/DRESSINGS) ×2 IMPLANT
BANDAGE ELASTIC 6 VELCRO ST LF (GAUZE/BANDAGES/DRESSINGS) ×2 IMPLANT
BANDAGE ESMARK 6X9 LF (GAUZE/BANDAGES/DRESSINGS) ×1 IMPLANT
BANDAGE GAUZE ELAST BULKY 4 IN (GAUZE/BANDAGES/DRESSINGS) ×2 IMPLANT
BLADE SAG 18X100X1.27 (BLADE) ×2 IMPLANT
BLADE SAW SGTL 11.0X1.19X90.0M (BLADE) ×2 IMPLANT
BNDG CMPR 9X6 STRL LF SNTH (GAUZE/BANDAGES/DRESSINGS) ×1
BNDG ESMARK 6X9 LF (GAUZE/BANDAGES/DRESSINGS) ×2
BONE CEMENT GENTAMICIN (Cement) ×4 IMPLANT
CEMENT BONE GENTAMICIN 40 (Cement) ×2 IMPLANT
CLOTH BEACON ORANGE TIMEOUT ST (SAFETY) ×2 IMPLANT
CLSR STERI-STRIP ANTIMIC 1/2X4 (GAUZE/BANDAGES/DRESSINGS) ×3 IMPLANT
CUFF TOURN SGL QUICK 34 (TOURNIQUET CUFF) ×2
CUFF TRNQT CYL 34X4X40X1 (TOURNIQUET CUFF) ×1 IMPLANT
DRAPE EXTREMITY T 121X128X90 (DRAPE) ×2 IMPLANT
DRAPE INCISE IOBAN 66X45 STRL (DRAPES) ×2 IMPLANT
DRAPE LG THREE QUARTER DISP (DRAPES) ×2 IMPLANT
DRAPE POUCH INSTRU U-SHP 10X18 (DRAPES) ×2 IMPLANT
DRAPE U-SHAPE 47X51 STRL (DRAPES) ×2 IMPLANT
DRSG ADAPTIC 3X8 NADH LF (GAUZE/BANDAGES/DRESSINGS) ×2 IMPLANT
DRSG PAD ABDOMINAL 8X10 ST (GAUZE/BANDAGES/DRESSINGS) ×5 IMPLANT
DURAPREP 26ML APPLICATOR (WOUND CARE) ×2 IMPLANT
ELECT REM PT RETURN 9FT ADLT (ELECTROSURGICAL) ×2
ELECTRODE REM PT RTRN 9FT ADLT (ELECTROSURGICAL) ×1 IMPLANT
EVACUATOR 1/8 PVC DRAIN (DRAIN) ×2 IMPLANT
FACESHIELD LNG OPTICON STERILE (SAFETY) ×12 IMPLANT
GLOVE BIOGEL PI IND STRL 8 (GLOVE) ×1 IMPLANT
GLOVE BIOGEL PI IND STRL 8.5 (GLOVE) IMPLANT
GLOVE BIOGEL PI INDICATOR 8 (GLOVE) ×1
GLOVE BIOGEL PI INDICATOR 8.5 (GLOVE)
GLOVE ECLIPSE 8.0 STRL XLNG CF (GLOVE) ×4 IMPLANT
GLOVE SURG SS PI 6.5 STRL IVOR (GLOVE) ×4 IMPLANT
GOWN PREVENTION PLUS LG XLONG (DISPOSABLE) ×4 IMPLANT
GOWN STRL REIN XL XLG (GOWN DISPOSABLE) ×2 IMPLANT
HANDPIECE INTERPULSE COAX TIP (DISPOSABLE) ×2
IMMOBILIZER KNEE 20 (SOFTGOODS) ×2
IMMOBILIZER KNEE 20 THIGH 36 (SOFTGOODS) IMPLANT
KIT BASIN OR (CUSTOM PROCEDURE TRAY) ×2 IMPLANT
MANIFOLD NEPTUNE II (INSTRUMENTS) ×2 IMPLANT
NEEDLE HYPO 22GX1.5 SAFETY (NEEDLE) ×2 IMPLANT
NS IRRIG 1000ML POUR BTL (IV SOLUTION) ×2 IMPLANT
PACK TOTAL JOINT (CUSTOM PROCEDURE TRAY) ×2 IMPLANT
POSITIONER SURGICAL ARM (MISCELLANEOUS) ×2 IMPLANT
SET HNDPC FAN SPRY TIP SCT (DISPOSABLE) ×1 IMPLANT
SET PAD KNEE POSITIONER (MISCELLANEOUS) ×2 IMPLANT
SPONGE GAUZE 4X4 12PLY (GAUZE/BANDAGES/DRESSINGS) ×1 IMPLANT
SPONGE LAP 18X18 X RAY DECT (DISPOSABLE) ×2 IMPLANT
SPONGE SURGIFOAM ABS GEL 100 (HEMOSTASIS) ×2 IMPLANT
STAPLER VISISTAT 35W (STAPLE) IMPLANT
SUCTION FRAZIER 12FR DISP (SUCTIONS) ×2 IMPLANT
SUT BONE WAX W31G (SUTURE) ×2 IMPLANT
SUT MNCRL AB 4-0 PS2 18 (SUTURE) ×2 IMPLANT
SUT VIC AB 0 CT1 27 (SUTURE) ×4
SUT VIC AB 0 CT1 27XBRD ANTBC (SUTURE) ×2 IMPLANT
SUT VIC AB 1 CT1 27 (SUTURE) ×4
SUT VIC AB 1 CT1 27XBRD ANTBC (SUTURE) ×2 IMPLANT
SUT VIC AB 2-0 CT1 27 (SUTURE) ×6
SUT VIC AB 2-0 CT1 TAPERPNT 27 (SUTURE) ×3 IMPLANT
SUT VLOC 180 0 24IN GS25 (SUTURE) ×2 IMPLANT
SYR 20CC LL (SYRINGE) ×2 IMPLANT
TOWEL OR 17X26 10 PK STRL BLUE (TOWEL DISPOSABLE) ×4 IMPLANT
TOWER CARTRIDGE SMART MIX (DISPOSABLE) ×2 IMPLANT
TRAY FOLEY CATH 14FRSI W/METER (CATHETERS) ×2 IMPLANT
WATER STERILE IRR 1500ML POUR (IV SOLUTION) ×2 IMPLANT
WRAP KNEE MAXI GEL POST OP (GAUZE/BANDAGES/DRESSINGS) ×4 IMPLANT

## 2012-07-10 NOTE — Anesthesia Preprocedure Evaluation (Addendum)
Anesthesia Evaluation  Patient identified by MRN, date of birth, ID band Patient awake    Reviewed: Allergy & Precautions, H&P , NPO status , Patient's Chart, lab work & pertinent test results  Airway Mallampati: II TM Distance: >3 FB Neck ROM: Full    Dental No notable dental hx.    Pulmonary neg pulmonary ROS,  breath sounds clear to auscultation  Pulmonary exam normal       Cardiovascular hypertension, Pt. on medications + dysrhythmias (s/p ablation) Atrial Fibrillation Rhythm:Regular Rate:Normal     Neuro/Psych Seizures - (20 years ago), Well Controlled,  CVA, Residual Symptoms negative psych ROS   GI/Hepatic negative GI ROS, Neg liver ROS, hiatal hernia,   Endo/Other  negative endocrine ROS  Renal/GU negative Renal ROS  negative genitourinary   Musculoskeletal negative musculoskeletal ROS (+)   Abdominal   Peds negative pediatric ROS (+)  Hematology negative hematology ROS (+)   Anesthesia Other Findings   Reproductive/Obstetrics negative OB ROS                          Anesthesia Physical Anesthesia Plan  ASA: III  Anesthesia Plan: General   Post-op Pain Management:    Induction: Intravenous  Airway Management Planned: Oral ETT  Additional Equipment:   Intra-op Plan:   Post-operative Plan: Extubation in OR  Informed Consent: I have reviewed the patients History and Physical, chart, labs and discussed the procedure including the risks, benefits and alternatives for the proposed anesthesia with the patient or authorized representative who has indicated his/her understanding and acceptance.   Dental advisory given  Plan Discussed with: CRNA  Anesthesia Plan Comments:         Anesthesia Quick Evaluation

## 2012-07-10 NOTE — Anesthesia Postprocedure Evaluation (Addendum)
  Anesthesia Post-op Note  Patient: Jay Walters  Procedure(s) Performed: Procedure(s) (LRB): TOTAL KNEE ARTHROPLASTY (Right)  Patient Location: PACU  Anesthesia Type: General  Level of Consciousness: awake and alert   Airway and Oxygen Therapy: Patient Spontanous Breathing  Post-op Pain: mild  Post-op Assessment: Post-op Vital signs reviewed, Patient's Cardiovascular Status Stable, Respiratory Function Stable, Patent Airway and No signs of Nausea or vomiting  Last Vitals:  Filed Vitals:   07/10/12 1430  BP: 167/70  Pulse: 49  Temp: 36.6 C  Resp: 15    Post-op Vital Signs: stable   Complications: No apparent anesthesia complications

## 2012-07-10 NOTE — Op Note (Signed)
NAMELAINE, GIOVANETTI               ACCOUNT NO.:  [id number]  MEDICAL RECORD NO.:  [id number]  LOCATION:  1611                         FACILITY:  [facility]  PHYSICIAN:  Georges Lynch. Jusitn Salsgiver, M.D.DATE OF BIRTH:  Mar 12, 1932  DATE OF PROCEDURE:  07/10/2012 DATE OF DISCHARGE:                              OPERATIVE REPORT   SURGEON:  Georges Lynch. Darrelyn Hillock, M.D.  ASSISTANT:  Dimitri Ped, [place]  PREOPERATIVE DIAGNOSIS: 1. Severe flexion contracture, right knee. 2. Severe degenerative arthritis with bone-on-bone right knee.  POSTOPERATIVE DIAGNOSIS: 1. Severe flexion contracture, right knee. 2. Severe degenerative arthritis with bone-on-bone right knee.  OPERATION: 1. Right total knee arthroplasty, right knee. 2. Release of multiple contractures, right knee.  DESCRIPTION OF PROCEDURE:  Under general anesthesia, routine orthopedic prep and draping of the right lower extremity carried out.  Appropriate time-out was first carried out.  I also marked the appropriate right leg in the holding area.  At this time after sterile prep and draping, the leg was exsanguinated with an Esmarch.  Tourniquet was elevated at 300 mmHg.  I then carried out an anterior approach to the knee.  Bleeders were identified and cauterized.  I then carried out a median parapatellar approach reflected the patella laterally and flexed the knee and did medial lateral meniscectomies and excised the anterior and posterior cruciate ligaments.  Note, I had to do a good release of the flexion contracture that he had as well.  He was severely contracted and was unable to completely extend and he lacked about 15 degrees of extension preop.  After the synovectomy, the initial drill hole was made in the intercondylar notch.  The canal finder was inserted.  We thoroughly irrigated out the femoral canal.  I then removed 12 mm thickness off the distal femur utilizing a next jig.  At this time, we measured the femur to be a size 5.  I  carried out anterior-posterior chamfer cuts for a size 5 right femur.  Next, attention was directed to the tibia.  We prepared our tibia by removing approximately 8 mm off the affected medial side.  Note, the knee was severely contracted.  We then removed some posterior spurs from the femoral condyles and we then were able to easily insert the spacer blocks and had good tension in flexion and extension.  We then thoroughly irrigated out the knee and continued to prepare the tibia.  We cut our Keel, cut out the proximal tibial plateau.  We then cut the notch cut out the distal femur.  Trial components were inserted.  We had a good fit with the 10 mm thickness insert.  We then prepared the patella.  The patella was severely high like this deformed.  We trimmed the patella down and did a usual resurfacing procedure.  Three drill holes were made in the patella.  The patella measured to be a size 38.  We thoroughly irrigated out the knee after removing all the trial components, dried the knee out cemented all 3 components simultaneously.  At this time, we then removed all loose pieces of cement.  We water picked out the knee several times to make sure we had no other loose  pieces of cement.  At this particular point, we then removed our trial tibial insert.  We injected after the preparation the 20 mL of Exparel and 20 mL mixture of normal saline.  I then inserted my permanent rotating platform prosthesis to size 5, 10 mm thickness.  The knee was reduced we had good flexion, good extension, good lateral and medial stability.  I then inserted my Hemovac drain, closed the knee in layers in usual fashion, remaining part of the Exparel preparation and was injected.  Sterile dressings were applied.  He had 2 g of IV Ancef preop.          ______________________________ Georges Lynch. Darrelyn Hillock, M.D.     RAG/MEDQ  D:  07/10/2012  T:  07/10/2012  Job:  [id number]

## 2012-07-10 NOTE — Transfer of Care (Signed)
Immediate Anesthesia Transfer of Care Note  Patient: Jay Walters  Procedure(s) Performed: Procedure(s) (LRB) with comments: TOTAL KNEE ARTHROPLASTY (Right)  Patient Location: PACU  Anesthesia Type:General  Level of Consciousness: awake and alert   Airway & Oxygen Therapy: Patient Spontanous Breathing and Patient connected to face mask oxygen  Post-op Assessment: Report given to PACU RN and Post -op Vital signs reviewed and stable  Post vital signs: Reviewed and stable  Complications: No apparent anesthesia complications

## 2012-07-10 NOTE — Brief Op Note (Signed)
07/10/2012  12:18 PM  PATIENT:  [name]  79 y.o. male  PRE-OPERATIVE DIAGNOSIS:  OA RIGHT KNEE with severe Flexion Contracture  POST-OPERATIVE DIAGNOSIS:  osteoarthritis right knee with severe Flexion contracture  PROCEDURE:  Procedure(s) (LRB) with comments: TOTAL KNEE ARTHROPLASTY (Right).and multiple releases of contractures.  SURGEON:  Surgeon(s) and Role:    * Jacki Cones, MD - Primary  PHYSICIAN ASSISTANT:Amber Wildomar PA  ASSISTANTS: Dimitri Ped PA}   ANESTHESIA:   general  EBL:  Total I/O In: 1000 [I.V.:1000] Out: -   BLOOD ADMINISTERED:none  DRAINS: (one) Hemovact drain(s) in the Right Knee with  Suction Open   LOCAL MEDICATIONS USED:  BUPIVICAINE 20cc mixed with 20cc of Normal Saline   SPECIMEN:  No Specimen  DISPOSITION OF SPECIMEN:  N/A  COUNTS:  YES  TOURNIQUET:  * Missing tourniquet times found for documented tourniquets in log:  [id number] *  DICTATION: .Other Dictation: Dictation Number [id number]  PLAN OF CARE: Admit to inpatient   PATIENT DISPOSITION:  Stable in OR   Delay start of Pharmacological VTE agent (>24hrs) due to surgical blood loss or risk of bleeding: yes

## 2012-07-10 NOTE — Progress Notes (Signed)
ANTICOAGULATION CONSULT NOTE - Initial Consult  Pharmacy Consult for Coumadin Indication: Coumadin PTA for Afluttaer  No Known Allergies  Patient Measurements:     Vital Signs: Temp: 97.8 F (36.6 C) (01/29 1525) Temp src: Oral (01/29 0754) BP: 169/76 mmHg (01/29 1525) Pulse Rate: 62  (01/29 1525)  Labs:  [facility] 07/10/12 0810  HGB 12.3*  HCT 36.1*  PLT 171  APTT --  LABPROT 14.8  INR 1.18  HEPARINUNFRC --  CREATININE --  CKTOTAL --  CKMB --  TROPONINI --    The CrCl is unknown because both a height and weight (above a minimum accepted value) are required for this calculation.   Medical History: Past Medical History  Diagnosis Date  . Dysphagia     with solids  . Esophageal stricture   . Diverticulosis   . Osteopenia   . Hemorrhoids   . High cholesterol   . Heart murmur   . H/O hiatal hernia   . Hypertension   . Atrial flutter     typical appearing by ekg. s/p ablation 07/28/11  . Atrial fibrillation     remotely   . Sinus bradycardia     asymptomatic  . Stroke 2012    "left me weaker on left side; balance is not good"  . GERD (gastroesophageal reflux disease)   . Seizures     "back w/migraine headaches; 1990's or before"  . Arthritis     "right hip; right knee; lower back ~ 1/2 way across"  . Anemia     Medications:  Prescriptions prior to admission  Medication Sig Dispense Refill  . aspirin 81 MG tablet Take 81 mg by mouth daily.      . Cholecalciferol (VITAMIN D3) 1000 UNITS CAPS Take 1,000 Units by mouth daily.       Marland Kitchen doxazosin (CARDURA) 4 MG tablet Take 4 mg by mouth every evening.       . finasteride (PROSCAR) 5 MG tablet Take 1 tablet by mouth every evening.       . Multiple Vitamins-Minerals (MULTIVITAMINS THER. W/MINERALS) TABS Take 1 tablet by mouth daily.        Marland Kitchen OVER THE COUNTER MEDICATION Take 1 tablet by mouth daily. Tumeric 500 mg      . phenytoin (DILANTIN) 100 MG ER capsule Take 300 mg by mouth at bedtime.       . digoxin  (LANOXIN) 0.125 MG tablet Take 0.0625 mg by mouth every other day. Takes 1/2 tablet every other day in the morning      . Polysaccharide Iron Complex (POLY-IRON 150 PO) Take 1 capsule by mouth 2 (two) times daily.      Marland Kitchen warfarin (COUMADIN) 5 MG tablet Take 5-7.5 mg by mouth daily. 1.5 tablets on sunday,  and fridays. Take 1 tablet the rest of the week.        Assessment:  32 YOM s/p R TKA, on coumadin PTA for hx Aflutter. To resume coumadin per pharmacy dosing post op  Dose PTA 7.5mg  Friday and Sunday and 5mg  other days, last taken 1/24  Pt also on sq heparin until INR tx  INR 1.18, no bleeding reported. H/H low @ baseline (12.3/26.1)  Goal of Therapy:  INR 2-3    Plan:  Coumadin 7.5mg  today Daily INR Cont sq heparin until INR >2  Gwen Her PharmD  [phone] 07/10/2012 3:39 PM

## 2012-07-10 NOTE — Interval H&P Note (Signed)
History and Physical Interval Note:  07/10/2012 9:41 AM  Jay Walters  has presented today for surgery, with the diagnosis of OA RIGHT KNEE   The various methods of treatment have been discussed with the patient and family. After consideration of risks, benefits and other options for treatment, the patient has consented to  Procedure(s) (LRB) with comments: TOTAL KNEE ARTHROPLASTY (Right) as a surgical intervention .  The patient's history has been reviewed, patient examined, no change in status, stable for surgery.  I have reviewed the patient's chart and labs.  Questions were answered to the patient's satisfaction.     [name] A

## 2012-07-11 ENCOUNTER — Encounter (HOSPITAL_COMMUNITY): Payer: Self-pay | Admitting: Orthopedic Surgery

## 2012-07-11 LAB — BASIC METABOLIC PANEL
BUN: 12 mg/dL (ref 6–23)
CO2: 27 mEq/L (ref 19–32)
GFR calc non Af Amer: 83 mL/min — ABNORMAL LOW (ref >90)
Glucose, Bld: 132 mg/dL — ABNORMAL HIGH (ref 70–99)
Potassium: 4 mEq/L (ref 3.5–5.1)

## 2012-07-11 LAB — CBC
HCT: 28.6 % — ABNORMAL LOW (ref 39.0–52.0)
Hemoglobin: 9.8 g/dL — ABNORMAL LOW (ref 13.0–17.0)
MCHC: 34.3 g/dL (ref 30.0–36.0)
RBC: 3.12 MIL/uL — ABNORMAL LOW (ref 4.22–5.81)

## 2012-07-11 MED ORDER — FERROUS SULFATE 325 (65 FE) MG PO TABS: 325.0000 mg | ORAL_TABLET | Freq: Three times a day (TID) | ORAL | Status: DC

## 2012-07-11 MED ORDER — OXYCODONE-ACETAMINOPHEN 5-325 MG PO TABS: 1.0000 | ORAL_TABLET | ORAL | Status: DC | PRN

## 2012-07-11 MED ORDER — WARFARIN SODIUM 7.5 MG PO TABS
7.5000 mg | ORAL_TABLET | Freq: Once | ORAL | Status: AC
  Administered 2012-07-11: 7.5 mg via ORAL
  Filled 2012-07-11: qty 1

## 2012-07-11 MED ORDER — METHOCARBAMOL 500 MG PO TABS: 500.0000 mg | ORAL_TABLET | Freq: Four times a day (QID) | ORAL | Status: DC | PRN

## 2012-07-11 MED ORDER — POLYETHYLENE GLYCOL 3350 17 G PO PACK: 17.0000 g | PACK | Freq: Every day | ORAL | Status: DC | PRN

## 2012-07-11 NOTE — Progress Notes (Addendum)
ANTICOAGULATION CONSULT NOTE - Initial Consult  Pharmacy Consult for Coumadin Indication: Coumadin PTA for Aflutter/VTE prophylaxis following R TKA  No Known Allergies  Patient Measurements: Height: 5\' 10"  (177.8 cm) Weight: 128 lb 12 oz (58.4 kg) IBW/kg (Calculated) : 73    Vital Signs: Temp: 99.4 F (37.4 C) (01/30 1102) Temp src: Oral (01/30 1102) BP: 118/54 mmHg (01/30 1102) Pulse Rate: 68  (01/30 1102)  Labs:  Basename 07/11/12 0449 07/10/12 1558 07/10/12 0810  HGB 9.8* 11.5* --  HCT 28.6* 34.0* 36.1*  PLT 128* 146* 171  APTT -- -- --  LABPROT 15.9* -- 14.8  INR 1.30 -- 1.18  HEPARINUNFRC -- -- --  CREATININE 0.82 0.79 --  CKTOTAL -- -- --  CKMB -- -- --  TROPONINI -- -- --    Estimated Creatinine Clearance: 61.3 ml/min (by C-G formula based on Cr of 0.82).   Medical History: Past Medical History  Diagnosis Date  . Dysphagia     with solids  . Esophageal stricture   . Diverticulosis   . Osteopenia   . Hemorrhoids   . High cholesterol   . Heart murmur   . H/O hiatal hernia   . Hypertension   . Atrial flutter     typical appearing by ekg. s/p ablation 07/28/11  . Atrial fibrillation     remotely   . Sinus bradycardia     asymptomatic  . Stroke 2012    "left me weaker on left side; balance is not good"  . GERD (gastroesophageal reflux disease)   . Seizures     "back w/migraine headaches; 1990's or before"  . Arthritis     "right hip; right knee; lower back ~ 1/2 way across"  . Anemia     Medications:  Prescriptions prior to admission  Medication Sig Dispense Refill  . aspirin 81 MG tablet Take 81 mg by mouth daily.      . Cholecalciferol (VITAMIN D3) 1000 UNITS CAPS Take 1,000 Units by mouth daily.       Marland Kitchen doxazosin (CARDURA) 4 MG tablet Take 4 mg by mouth every evening.       . finasteride (PROSCAR) 5 MG tablet Take 1 tablet by mouth every evening.       . Multiple Vitamins-Minerals (MULTIVITAMINS THER. W/MINERALS) TABS Take 1 tablet by  mouth daily.        Marland Kitchen OVER THE COUNTER MEDICATION Take 1 tablet by mouth daily. Tumeric 500 mg      . phenytoin (DILANTIN) 100 MG ER capsule Take 300 mg by mouth at bedtime.       . digoxin (LANOXIN) 0.125 MG tablet Take 0.0625 mg by mouth every other day. Takes 1/2 tablet every other day in the morning      . Polysaccharide Iron Complex (POLY-IRON 150 PO) Take 1 capsule by mouth 2 (two) times daily.      Marland Kitchen warfarin (COUMADIN) 5 MG tablet Take 5-7.5 mg by mouth daily. 1.5 tablets on sunday,  and fridays. Take 1 tablet the rest of the week.        Assessment:  19 YOM s/p R TKA, on coumadin PTA for hx Aflutter. To resume coumadin per pharmacy dosing post op  Dose PTA 7.5mg  Friday and Sunday and 5mg  other days, last taken 1/24  Pt also on sq heparin until INR tx  INR 1.3 following 1st dose warfarin last pm  CBC: Hgb = 9.8, plts = 128,  no bleeding reported.   Goal of  Therapy:  INR 2-3    Plan:   Repeat Coumadin 7.5mg  today  Daily INR  Cont sq heparin until INR >2  Juliette Alcide, PharmD, BCPS.   Pager: [phone] 07/11/2012 12:42 PM

## 2012-07-11 NOTE — Progress Notes (Addendum)
   Subjective: 1 Day Post-Op Procedure(s) (LRB): TOTAL KNEE ARTHROPLASTY (Right) Patient reports pain as moderate.   Patient seen in rounds with Dr. Darrelyn Hillock. Patient is well, and has had no acute complaints or problems. No issues overnight. He got some rest last night but not consistent sleep due to pain. No complaints of shortness of breath or chest pain.  Plan is to go Home after hospital stay.  Objective: Vital signs in last 24 hours: Temp:  [97.4 F (36.3 C)-101 F (38.3 C)] 99.6 F (37.6 C) (01/30 0408) Pulse Rate:  [48-74] 66  (01/30 0408) Resp:  [14-20] 18  (01/30 0408) BP: (121-169)/(41-82) 130/64 mmHg (01/30 0408) SpO2:  [99 %-100 %] 100 % (01/30 0408) Weight:  [58.4 kg (128 lb 12 oz)] 58.4 kg (128 lb 12 oz) (01/30 0100)  Intake/Output from previous day:  Intake/Output Summary (Last 24 hours) at 07/11/12 0748 Last data filed at 07/11/12 1610  Gross per 24 hour  Intake 3873.33 ml  Output   2140 ml  Net 1733.33 ml     Labs:  Basename 07/11/12 0449 07/10/12 1558 07/10/12 0810  HGB 9.8* 11.5* 12.3*    Basename 07/11/12 0449 07/10/12 1558  WBC 9.2 6.0  RBC 3.12* 3.68*  HCT 28.6* 34.0*  PLT 128* 146*    Basename 07/11/12 0449 07/10/12 1558  NA 134* --  K 4.0 --  CL 100 --  CO2 27 --  BUN 12 --  CREATININE 0.82 0.79  GLUCOSE 132* --  CALCIUM 8.2* --    Basename 07/11/12 0449 07/10/12 0810  LABPT -- --  INR 1.30 1.18    EXAM General - Patient is Alert and Oriented Extremity - Neurologically intact Neurovascular intact Dorsiflexion/Plantar flexion intact Incision: dressing C/D/I Motor Function - intact, moving foot and toes well on exam.  Hemovac pulled without difficulty.  Past Medical History  Diagnosis Date  . Dysphagia     with solids  . Esophageal stricture   . Diverticulosis   . Osteopenia   . Hemorrhoids   . High cholesterol   . Heart murmur   . H/O hiatal hernia   . Hypertension   . Atrial flutter     typical appearing by ekg.  s/p ablation 07/28/11  . Atrial fibrillation     remotely   . Sinus bradycardia     asymptomatic  . Stroke 2012    "left me weaker on left side; balance is not good"  . GERD (gastroesophageal reflux disease)   . Seizures     "back w/migraine headaches; 1990's or before"  . Arthritis     "right hip; right knee; lower back ~ 1/2 way across"  . Anemia     Assessment/Plan: 1 Day Post-Op Procedure(s) (LRB): TOTAL KNEE ARTHROPLASTY (Right) Active Problems:  Primary osteoarthritis of right knee  Flexion contracture of knee  Postoperative anemia due to acute blood loss  Estimated Body mass index is 18.47 kg/(m^2) as calculated from the following:   Height as of this encounter: 5\' 10" (1.778 m).   Weight as of this encounter: 128 lb 12 oz(58.4 kg). Advance diet Up with therapy Discharge home with home health tentative Saturday  DVT Prophylaxis - Coumadin Weight-Bearing as tolerated to right leg   He is doing well. We will start therapy today. Will continue to monitor Hgb, recheck scheduled for the morning. Hopeful for discharge home Saturday.   Cadee Agro LAUREN 07/11/2012, 7:48 AM

## 2012-07-11 NOTE — Evaluation (Signed)
Physical Therapy Evaluation Patient Details Name: Jay Walters MRN: [id number] DOB: [birth year] Today's Date: 07/11/2012 Time: 4098-1191 PT Time Calculation (min): 40 min  PT Assessment / Plan / Recommendation Clinical Impression  Pt s/p R TKR presents with decreased R LE strength/ROM and post op pain limiting functional mobility    PT Assessment  Patient needs continued PT services    Follow Up Recommendations  SNF;Home health PT    Does the patient have the potential to tolerate intense rehabilitation      Barriers to Discharge None      Equipment Recommendations  None recommended by PT    Recommendations for Other Services OT consult   Frequency 7X/week    Precautions / Restrictions Precautions Precautions: Knee;Fall Required Braces or Orthoses: Knee Immobilizer - Right Knee Immobilizer - Right: Discontinue once straight leg raise with < 10 degree lag Restrictions Weight Bearing Restrictions: No Other Position/Activity Restrictions: WBAT   Pertinent Vitals/Pain 5/10; premed, ice packs provided      Mobility  Transfers Transfers: Sit to Stand;Stand to Sit Sit to Stand: 1: +2 Total assist Sit to Stand: Patient Percentage: 50% Stand to Sit: 1: +2 Total assist Stand to Sit: Patient Percentage: 50% Details for Transfer Assistance: cues for LE management and use of UEs to self assist.; Physical assist to bring to upright position Ambulation/Gait Ambulation/Gait Assistance: 1: +2 Total assist Ambulation/Gait: Patient Percentage: 50% Ambulation Distance (Feet): 6 Feet Assistive device: Rolling walker Ambulation/Gait Assistance Details: cues for posture, posture, posture, position from RW, sequence and increased UE WB Gait Pattern: Step-to pattern;Decreased step length - left;Decreased stance time - left;Trunk flexed General Gait Details: Constant cues and physical assist to correct for flexed posture, flexed knees, and flexed UEs    Shoulder Instructions      Exercises Total Joint Exercises Ankle Circles/Pumps: AROM;10 reps;Supine;Both Quad Sets: AROM;10 reps;Both;Supine Heel Slides: AAROM;10 reps;Right;Supine Straight Leg Raises: AAROM;Right;10 reps;Supine   PT Diagnosis: Difficulty walking  PT Problem List: Decreased strength;Decreased range of motion;Decreased activity tolerance;Decreased balance;Decreased mobility;Decreased knowledge of use of DME;Pain PT Treatment Interventions: DME instruction;Gait training;Stair training;Functional mobility training;Therapeutic activities;Therapeutic exercise;Patient/family education   PT Goals Acute Rehab PT Goals PT Goal Formulation: With patient Time For Goal Achievement: 07/17/12 Potential to Achieve Goals: Fair Pt will go Supine/Side to Sit: with supervision PT Goal: Supine/Side to Sit - Progress: Goal set today Pt will go Sit to Supine/Side: with supervision PT Goal: Sit to Supine/Side - Progress: Goal set today Pt will go Sit to Stand: with supervision PT Goal: Sit to Stand - Progress: Goal set today Pt will go Stand to Sit: with supervision PT Goal: Stand to Sit - Progress: Goal set today Pt will Ambulate: 16 - 50 feet;with supervision;with rolling walker PT Goal: Ambulate - Progress: Goal set today Pt will Go Up / Down Stairs: 1-2 stairs;with least restrictive assistive device;with min assist PT Goal: Up/Down Stairs - Progress: Goal set today  Visit Information  Last PT Received On: 07/11/12 Assistance Needed: +2    Subjective Data  Subjective: I just want to get where I can manage the pain Patient Stated Goal: Resume previous lifestyle with decreased pain   Prior Functioning  Home Living Lives With: Spouse Available Help at Discharge: Family Type of Home: House Home Access: Stairs to enter Secretary/administrator of Steps: 2 Entrance Stairs-Rails: Right Home Layout: One level Home Adaptive Equipment: Bedside commode/3-in-1;Walker - rolling Additional Comments: Pt states has  all equipment from [facility] last year Prior Function Level of Independence: Independent  with assistive device(s) Able to Take Stairs?: Yes Vocation: Retired Musician: No difficulties Dominant Hand: Right    Cognition  Overall Cognitive Status: Appears within functional limits for tasks assessed/performed Arousal/Alertness: Awake/alert Orientation Level: Appears intact for tasks assessed Behavior During Session: [facility] for tasks performed    Extremity/Trunk Assessment Right Upper Extremity Assessment RUE ROM/Strength/Tone: [facility] for tasks assessed Left Upper Extremity Assessment LUE ROM/Strength/Tone: WFL for tasks assessed Right Lower Extremity Assessment RLE ROM/Strength/Tone: Deficits RLE ROM/Strength/Tone Deficits: 2/5 quads with AAROM at knee -10 - 35 Left Lower Extremity Assessment LLE ROM/Strength/Tone: Deficits LLE ROM/Strength/Tone Deficits: 4-/5 strength   Balance    End of Session PT - End of Session Equipment Utilized During Treatment: Gait belt;Right knee immobilizer Activity Tolerance: Patient limited by fatigue Patient left: in chair;with call bell/phone within reach Nurse Communication: Mobility status  GP     Kindsey Eblin 07/11/2012, 12:41 PM

## 2012-07-11 NOTE — Progress Notes (Signed)
Physical Therapy Treatment Patient Details Name: Jay Walters MRN: [id number] DOB: 07-24-1931 Today's Date: 07/11/2012 Time: 7829-5621 PT Time Calculation (min): 28 min  PT Assessment / Plan / Recommendation Comments on Treatment Session  Pt ltd by difficulty comprehending WB through UEs to support R LE     Follow Up Recommendations  SNF;Home health PT     Does the patient have the potential to tolerate intense rehabilitation     Barriers to Discharge        Equipment Recommendations  None recommended by PT    Recommendations for Other Services OT consult  Frequency 7X/week   Plan Discharge plan remains appropriate    Precautions / Restrictions Precautions Precautions: Knee;Fall Required Braces or Orthoses: Knee Immobilizer - Right Knee Immobilizer - Right: Discontinue once straight leg raise with < 10 degree lag Restrictions Weight Bearing Restrictions: No Other Position/Activity Restrictions: WBAT   Pertinent Vitals/Pain RN assisting with session - pt BP on return to sitting 102/41.  Pt denies dizziness while standing but admits to "not thinking clearly".    Mobility  Transfers Transfers: Sit to Stand;Stand to Sit Sit to Stand: 1: +2 Total assist Sit to Stand: Patient Percentage: 50% Stand to Sit: 1: +2 Total assist Stand to Sit: Patient Percentage: 50% Details for Transfer Assistance: cues for LE management and use of UEs to self assist.; Physical assist to bring to upright position Ambulation/Gait Ambulation/Gait Assistance: 1: +2 Total assist Ambulation/Gait: Patient Percentage: 50% Ambulation Distance (Feet): 6 Feet Assistive device: Rolling walker Ambulation/Gait Assistance Details: constant cues for posture, sequence, position from RW, stride length and increased UE WB Gait Pattern: Step-to pattern;Decreased step length - left;Decreased stance time - left;Trunk flexed General Gait Details: Constant cues and physical assist to correct for flexed posture,  flexed knees, and flexed UEs    Exercises     PT Diagnosis:    PT Problem List:   PT Treatment Interventions:     PT Goals Acute Rehab PT Goals PT Goal Formulation: With patient Time For Goal Achievement: 07/17/12 Potential to Achieve Goals: Fair Pt will go Supine/Side to Sit: with supervision PT Goal: Supine/Side to Sit - Progress: Goal set today Pt will go Sit to Supine/Side: with supervision PT Goal: Sit to Supine/Side - Progress: Goal set today Pt will go Sit to Stand: with supervision PT Goal: Sit to Stand - Progress: Goal set today Pt will go Stand to Sit: with supervision PT Goal: Stand to Sit - Progress: Goal set today Pt will Ambulate: 16 - 50 feet;with supervision;with rolling walker PT Goal: Ambulate - Progress: Goal set today Pt will Go Up / Down Stairs: 1-2 stairs;with least restrictive assistive device;with min assist PT Goal: Up/Down Stairs - Progress: Goal set today  Visit Information  Last PT Received On: 07/11/12 Assistance Needed: +2    Subjective Data  Patient Stated Goal: Resume previous lifestyle with decreased pain   Cognition  Overall Cognitive Status: Appears within functional limits for tasks assessed/performed Arousal/Alertness: Awake/alert Orientation Level: Appears intact for tasks assessed Behavior During Session: [facility] for tasks performed    Balance     End of Session PT - End of Session Equipment Utilized During Treatment: Gait belt;Right knee immobilizer Activity Tolerance: Patient limited by fatigue Patient left: in chair;with call bell/phone within reach Nurse Communication: Mobility status   GP     Jay Walters 07/11/2012, 4:01 PM

## 2012-07-12 LAB — CBC
HCT: 26.1 % — ABNORMAL LOW (ref 39.0–52.0)
Hemoglobin: 9 g/dL — ABNORMAL LOW (ref 13.0–17.0)
MCV: 91.3 fL (ref 78.0–100.0)
RBC: 2.86 MIL/uL — ABNORMAL LOW (ref 4.22–5.81)
RDW: 13.8 % (ref 11.5–15.5)
WBC: 9.1 10*3/uL (ref 4.0–10.5)

## 2012-07-12 LAB — HEMOGLOBIN AND HEMATOCRIT, BLOOD
HCT: 28.4 % — ABNORMAL LOW (ref 39.0–52.0)
Hemoglobin: 9.6 g/dL — ABNORMAL LOW (ref 13.0–17.0)

## 2012-07-12 LAB — BASIC METABOLIC PANEL
BUN: 14 mg/dL (ref 6–23)
CO2: 27 mEq/L (ref 19–32)
Chloride: 98 mEq/L (ref 96–112)
Creatinine, Ser: 0.91 mg/dL (ref 0.50–1.35)
Glucose, Bld: 123 mg/dL — ABNORMAL HIGH (ref 70–99)
Potassium: 3.8 mEq/L (ref 3.5–5.1)

## 2012-07-12 LAB — PROTIME-INR: Prothrombin Time: 23.1 seconds — ABNORMAL HIGH (ref 11.6–15.2)

## 2012-07-12 MED ORDER — BLISTEX EX OINT
TOPICAL_OINTMENT | CUTANEOUS | Status: AC
  Administered 2012-07-12: 14:00:00
  Filled 2012-07-12: qty 10

## 2012-07-12 MED ORDER — WARFARIN SODIUM 1 MG PO TABS
1.0000 mg | ORAL_TABLET | Freq: Once | ORAL | Status: AC
  Administered 2012-07-12: 1 mg via ORAL
  Filled 2012-07-12: qty 1

## 2012-07-12 MED ORDER — BLISTEX EX OINT
TOPICAL_OINTMENT | CUTANEOUS | Status: AC
  Filled 2012-07-12: qty 10

## 2012-07-12 NOTE — Progress Notes (Signed)
Clinical Social Work Department BRIEF PSYCHOSOCIAL ASSESSMENT 07/12/2012  Patient:  Jay Walters, Jay Walters     Account Number:  [id number]     Admit date:  07/10/2012  Clinical Social Worker:  Candie Chroman  Date/Time:  07/12/2012 08:58 AM  Referred by:  Physician  Date Referred:  07/12/2012 Referred for  SNF Placement   Other Referral:   Interview type:  Patient Other interview type:    PSYCHOSOCIAL DATA Living Status:  WIFE Admitted from facility:   Level of care:   Primary support name:  Devin Heathman Primary support relationship to patient:  SPOUSE Degree of support available:   supportive    CURRENT CONCERNS Current Concerns  Post-Acute Placement   Other Concerns:    SOCIAL WORK ASSESSMENT / PLAN Pt is a 79 yr old gentleman living at home prior to hospitalization. CSW met with pt to assist with d/c planning. PT has recommended ST SNF. Pt will consider this option if [facility] will cover stay at SNF. SNF search has been initiated and bed offers will be provided as received.   Assessment/plan status:  Psychosocial Support/Ongoing Assessment of Needs Other assessment/ plan:   Home with HH Services   Information/referral to community resources:   SNF list with bed offers to be provided.    PATIENT'S/FAMILY'S RESPONSE TO PLAN OF CARE: Pt willing to consider ST SNF placement if [facility] will assist with coverage.    Cori Razor LCSW [phone]

## 2012-07-12 NOTE — Progress Notes (Signed)
Physical Therapy Treatment Patient Details Name: Jay Walters MRN: [id number] DOB: 1931-09-13 Today's Date: 07/12/2012 Time: 8295-6213 PT Time Calculation (min): 31 min  PT Assessment / Plan / Recommendation Comments on Treatment Session  Pt Orthostatic and with decreased responsiveness while returning to bed.  RN aware    Follow Up Recommendations  SNF     Does the patient have the potential to tolerate intense rehabilitation     Barriers to Discharge        Equipment Recommendations  None recommended by PT    Recommendations for Other Services OT consult  Frequency 7X/week   Plan Discharge plan remains appropriate    Precautions / Restrictions Precautions Precautions: Knee;Fall Required Braces or Orthoses: Knee Immobilizer - Right Knee Immobilizer - Right: Discontinue once straight leg raise with < 10 degree lag Restrictions Weight Bearing Restrictions: No Other Position/Activity Restrictions: WBAT   Pertinent Vitals/Pain 3/10 pain; BP in recliner 123/60; sitting with feet down 107/55; standing 91/38; Pt with decreased responsiveness with return to bed.  RN alerted and assessing pt.    Mobility  Transfers Transfers: Sit to Stand;Stand to Sit Sit to Stand: 1: +2 Total assist Sit to Stand: Patient Percentage: 70% Stand to Sit: 1: +2 Total assist Stand to Sit: Patient Percentage: 0% Details for Transfer Assistance: cues for LE management and use of UEs to self assist.; Physical assist to bring to upright position Ambulation/Gait Ambulation/Gait Assistance: 1: +2 Total assist Ambulation/Gait: Patient Percentage: 50% Ambulation Distance (Feet): 2 Feet Assistive device: Rolling walker Ambulation/Gait Assistance Details: cues for posture and position from RW Gait Pattern: Step-to pattern General Gait Details: Pt becoming less and less responsive to following cues and lifted back to bed    Exercises     PT Diagnosis:    PT Problem List:   PT Treatment Interventions:      PT Goals Acute Rehab PT Goals PT Goal Formulation: With patient (lack of progress 2* orthostatic hypotension) Time For Goal Achievement: 07/17/12 Potential to Achieve Goals: Fair Pt will go Supine/Side to Sit: with supervision  Visit Information  Last PT Received On: 07/12/12 Assistance Needed: +2    Subjective Data  Subjective: I'm feeling ok Patient Stated Goal: Resume previous lifestyle with decreased pain   Cognition  Overall Cognitive Status: Appears within functional limits for tasks assessed/performed Arousal/Alertness: Awake/alert Orientation Level: Appears intact for tasks assessed Behavior During Session: [facility] for tasks performed    Balance     End of Session PT - End of Session Equipment Utilized During Treatment: Gait belt;Right knee immobilizer Activity Tolerance: Other (comment) Patient left: in bed;with call bell/phone within reach;with nursing in room Nurse Communication: Mobility status;Other (comment)   GP     Jay Walters 07/12/2012, 4:47 PM

## 2012-07-12 NOTE — Progress Notes (Signed)
Subjective: 2 Days Post-Op Procedure(s) (LRB): TOTAL KNEE ARTHROPLASTY (Right) Patient reports pain as moderate.   Patient seen in rounds without Dr. Darrelyn Hillock. Patient is well, and has had no acute complaints or problems. He says that therapy went ok yesterday but he did have a lot of pain when walking. Voiding well. No BM but positive flatus. Denies shortness of breath and chest pain. Sleeping in chair as opposed to bed due to history of hip and back pain. No issues overnight.  Plan is to go Home after hospital stay.  Objective: Vital signs in last 24 hours: Temp:  [99.3 F (37.4 C)-102 F (38.9 C)] 100 F (37.8 C) (01/31 0700) Pulse Rate:  [68-84] 84  (01/31 0549) Resp:  [16] 16  (01/31 0549) BP: (112-134)/(54-62) 128/62 mmHg (01/31 0549) SpO2:  [96 %-98 %] 97 % (01/31 0549)  Intake/Output from previous day:  Intake/Output Summary (Last 24 hours) at 07/12/12 0802 Last data filed at 07/12/12 0600  Gross per 24 hour  Intake   2230 ml  Output   1650 ml  Net    580 ml    Intake/Output this shift:    Labs:  Basename 07/12/12 0419 07/11/12 0449 07/10/12 1558 07/10/12 0810  HGB 9.0* 9.8* 11.5* 12.3*    Basename 07/12/12 0419 07/11/12 0449  WBC 9.1 9.2  RBC 2.86* 3.12*  HCT 26.1* 28.6*  PLT 117* 128*    Basename 07/12/12 0419 07/11/12 0449  NA 134* 134*  K 3.8 4.0  CL 98 100  CO2 27 27  BUN 14 12  CREATININE 0.91 0.82  GLUCOSE 123* 132*  CALCIUM 8.4 8.2*    Basename 07/12/12 0419 07/11/12 0449  LABPT -- --  INR 2.15* 1.30    EXAM General - Patient is Alert and Oriented Extremity - Neurologically intact Neurovascular intact Dorsiflexion/Plantar flexion intact Compartment soft Dressing/Incision - clean, dry, no drainage Motor Function - intact, moving foot and toes well on exam.   Past Medical History  Diagnosis Date  . Dysphagia     with solids  . Esophageal stricture   . Diverticulosis   . Osteopenia   . Hemorrhoids   . High cholesterol   .  Heart murmur   . H/O hiatal hernia   . Hypertension   . Atrial flutter     typical appearing by ekg. s/p ablation 07/28/11  . Atrial fibrillation     remotely   . Sinus bradycardia     asymptomatic  . Stroke 2012    "left me weaker on left side; balance is not good"  . GERD (gastroesophageal reflux disease)   . Seizures     "back w/migraine headaches; 1990's or before"  . Arthritis     "right hip; right knee; lower back ~ 1/2 way across"  . Anemia     Assessment/Plan: 2 Days Post-Op Procedure(s) (LRB): TOTAL KNEE ARTHROPLASTY (Right) Active Problems:  Primary osteoarthritis of right knee  Flexion contracture of knee  Postoperative anemia due to acute blood loss  Estimated Body mass index is 18.47 kg/(m^2) as calculated from the following:   Height as of this encounter: 5\' 10" (1.778 m).   Weight as of this encounter: 128 lb 12 oz(58.4 kg). Advance diet Up with therapy Plan for discharge tomorrow Discharge home with home health tomorrow  DVT Prophylaxis - Xarelto Weight-Bearing as tolerated to right leg   He is doing well but did progress slowly with therapy yesterday. Will continue therapy today and plan for discharge home  tomorrow. Recheck labs in the morning.   Kaytie Ratcliffe LAUREN 07/12/2012, 8:02 AM

## 2012-07-12 NOTE — Progress Notes (Signed)
Clinical Social Work Department CLINICAL SOCIAL WORK PLACEMENT NOTE 07/12/2012  Patient:  Jay Walters, Jay Walters  Account Number:  [id number] Admit date:  07/10/2012  Clinical Social Worker:  Cori Razor, LCSW  Date/time:  07/12/2012 09:05 AM  Clinical Social Work is seeking post-discharge placement for this patient at the following level of care:   SKILLED NURSING   (*CSW will update this form in Epic as items are completed)   07/12/2012  Patient/family provided with [facility] Department of Clinical Social Work's list of facilities offering this level of care within the geographic area requested by the patient (or if unable, by the patient's family).  07/12/2012  Patient/family informed of their freedom to choose among providers that offer the needed level of care, that participate in Medicare, Medicaid or managed care program needed by the patient, have an available bed and are willing to accept the patient.    Patient/family informed of [facility]' ownership interest in [facility], as well as of the fact that they are under no obligation to receive care at this facility.  PASARR submitted to EDS on 07/11/2012 PASARR number received from [employer] on 07/11/2012  FL2 transmitted to all facilities in geographic area requested by pt/family on  07/12/2012 FL2 transmitted to all facilities within larger geographic area on   Patient informed that his/her managed care company has contracts with or will negotiate with  certain facilities, including the following:     Patient/family informed of bed offers received:   Patient chooses bed at  Physician recommends and patient chooses bed at    Patient to be transferred to  on   Patient to be transferred to facility by   The following physician request were entered in Epic:   Additional Comments:  Cori Razor LCSW [phone]

## 2012-07-12 NOTE — Discharge Summary (Addendum)
Physician Discharge Summary   Patient ID: Jay Walters MRN: [id number] DOB/AGE: [birth year] 77 y.o.  Admit date: 07/10/2012 Discharge date: 07/15/2012  Primary Diagnosis: Osteoarthritis, right knee  Admission Diagnoses:  Past Medical History  Diagnosis Date  . Dysphagia     with solids  . Esophageal stricture   . Diverticulosis   . Osteopenia   . Hemorrhoids   . High cholesterol   . Heart murmur   . H/O hiatal hernia   . Hypertension   . Atrial flutter     typical appearing by ekg. s/p ablation 07/28/11  . Atrial fibrillation     remotely   . Sinus bradycardia     asymptomatic  . Stroke 2012    "left me weaker on left side; balance is not good"  . GERD (gastroesophageal reflux disease)   . Seizures     "back w/migraine headaches; 1990's or before"  . Arthritis     "right hip; right knee; lower back ~ 1/2 way across"  . Anemia    Discharge Diagnoses:   Active Problems:  Primary osteoarthritis of right knee  Flexion contracture of knee  Postoperative anemia due to acute blood loss S/P right total knee arthroplasty  Estimated Body mass index is 18.47 kg/(m^2) as calculated from the following:   Height as of this encounter: 5\' 10" (1.778 m).   Weight as of this encounter: 128 lb 12 oz(58.4 kg).  Classification of overweight in adults according to BMI (WHO, 1998)   Procedure:  Procedure(s) (LRB): TOTAL KNEE ARTHROPLASTY (Right)   Consults: None  HPI: Jay Walters, 79 y.o. male, has a history of pain and functional disability in the right knee due to arthritis and has failed non-surgical conservative treatments for greater than 12 weeks to includeNSAID's and/or analgesics, corticosteriod injections, flexibility and strengthening excercises and activity modification. Onset of symptoms was gradual, starting 5 years ago with gradually worsening course since that time. The patient noted no past surgery on the right knee(s). Patient currently rates pain in the right knee(s)  at 6 out of 10 with activity. Patient has night pain, worsening of pain with activity and weight bearing, pain that interferes with activities of daily living, pain with passive range of motion and crepitus. Patient has evidence of periarticular osteophytes and joint space narrowing by imaging studies. There is no active infection.   Laboratory Data: Admission on 07/10/2012  Component Date Value Range Status  . ABO/RH(D) 07/10/2012 A POS   Final  . Antibody Screen 07/10/2012 NEG   Final  . Sample Expiration 07/10/2012 07/13/2012   Final  . WBC 07/10/2012 2.9* 4.0 - 10.5 K/uL Final  . RBC 07/10/2012 3.94* 4.22 - 5.81 MIL/uL Final  . Hemoglobin 07/10/2012 12.3* 13.0 - 17.0 g/dL Final  . HCT 40/98/1191 36.1* 39.0 - 52.0 % Final  . MCV 07/10/2012 91.6  78.0 - 100.0 fL Final  . MCH 07/10/2012 31.2  26.0 - 34.0 pg Final  . MCHC 07/10/2012 34.1  30.0 - 36.0 g/dL Final  . RDW 47/82/9562 13.7  11.5 - 15.5 % Final  . Platelets 07/10/2012 171  150 - 400 K/uL Final  . Prothrombin Time 07/10/2012 14.8  11.6 - 15.2 seconds Final  . INR 07/10/2012 1.18  0.00 - 1.49 Final  . ABO/RH(D) 07/10/2012 A POS   Final  . WBC 07/10/2012 6.0  4.0 - 10.5 K/uL Final  . RBC 07/10/2012 3.68* 4.22 - 5.81 MIL/uL Final  . Hemoglobin 07/10/2012 11.5* 13.0 - 17.0 g/dL Final  .  HCT 07/10/2012 34.0* 39.0 - 52.0 % Final  . MCV 07/10/2012 92.4  78.0 - 100.0 fL Final  . MCH 07/10/2012 31.3  26.0 - 34.0 pg Final  . MCHC 07/10/2012 33.8  30.0 - 36.0 g/dL Final  . RDW 16/03/9603 13.4  11.5 - 15.5 % Final  . Platelets 07/10/2012 146* 150 - 400 K/uL Final  . Creatinine, Ser 07/10/2012 0.79  0.50 - 1.35 mg/dL Final  . GFR calc non Af Amer 07/10/2012 84* >90 mL/min Final  . GFR calc Af Amer 07/10/2012 >90  >90 mL/min Final   Comment:                                 The eGFR has been calculated                          using the CKD EPI equation.                          This calculation has not been                           validated in all clinical                          situations.                          eGFR's persistently                          <90 mL/min signify                          possible Chronic Kidney Disease.  . WBC 07/11/2012 9.2  4.0 - 10.5 K/uL Final  . RBC 07/11/2012 3.12* 4.22 - 5.81 MIL/uL Final  . Hemoglobin 07/11/2012 9.8* 13.0 - 17.0 g/dL Final  . HCT 54/02/8118 28.6* 39.0 - 52.0 % Final  . MCV 07/11/2012 91.7  78.0 - 100.0 fL Final  . MCH 07/11/2012 31.4  26.0 - 34.0 pg Final  . MCHC 07/11/2012 34.3  30.0 - 36.0 g/dL Final  . RDW 14/78/2956 13.8  11.5 - 15.5 % Final  . Platelets 07/11/2012 128* 150 - 400 K/uL Final  . Sodium 07/11/2012 134* 135 - 145 mEq/L Final  . Potassium 07/11/2012 4.0  3.5 - 5.1 mEq/L Final  . Chloride 07/11/2012 100  96 - 112 mEq/L Final  . CO2 07/11/2012 27  19 - 32 mEq/L Final  . Glucose, Bld 07/11/2012 132* 70 - 99 mg/dL Final  . BUN 21/30/8657 12  6 - 23 mg/dL Final  . Creatinine, Ser 07/11/2012 0.82  0.50 - 1.35 mg/dL Final  . Calcium 84/69/6295 8.2* 8.4 - 10.5 mg/dL Final  . GFR calc non Af Amer 07/11/2012 83* >90 mL/min Final  . GFR calc Af Amer 07/11/2012 >90  >90 mL/min Final   Comment:                                 The eGFR has been calculated  using the CKD EPI equation.                          This calculation has not been                          validated in all clinical                          situations.                          eGFR's persistently                          <90 mL/min signify                          possible Chronic Kidney Disease.  Marland Kitchen Prothrombin Time 07/11/2012 15.9* 11.6 - 15.2 seconds Final  . INR 07/11/2012 1.30  0.00 - 1.49 Final  . WBC 07/12/2012 9.1  4.0 - 10.5 K/uL Final  . RBC 07/12/2012 2.86* 4.22 - 5.81 MIL/uL Final  . Hemoglobin 07/12/2012 9.0* 13.0 - 17.0 g/dL Final  . HCT 16/03/9603 26.1* 39.0 - 52.0 % Final  . MCV 07/12/2012 91.3  78.0 - 100.0 fL Final  . MCH  07/12/2012 31.5  26.0 - 34.0 pg Final  . MCHC 07/12/2012 34.5  30.0 - 36.0 g/dL Final  . RDW 54/02/8118 13.8  11.5 - 15.5 % Final  . Platelets 07/12/2012 117* 150 - 400 K/uL Final   CONSISTENT WITH PREVIOUS RESULT  . Sodium 07/12/2012 134* 135 - 145 mEq/L Final  . Potassium 07/12/2012 3.8  3.5 - 5.1 mEq/L Final  . Chloride 07/12/2012 98  96 - 112 mEq/L Final  . CO2 07/12/2012 27  19 - 32 mEq/L Final  . Glucose, Bld 07/12/2012 123* 70 - 99 mg/dL Final  . BUN 14/78/2956 14  6 - 23 mg/dL Final  . Creatinine, Ser 07/12/2012 0.91  0.50 - 1.35 mg/dL Final  . Calcium 21/30/8657 8.4  8.4 - 10.5 mg/dL Final  . GFR calc non Af Amer 07/12/2012 79* >90 mL/min Final  . GFR calc Af Amer 07/12/2012 >90  >90 mL/min Final   Comment:                                 The eGFR has been calculated                          using the CKD EPI equation.                          This calculation has not been                          validated in all clinical                          situations.                          eGFR's persistently                          <  90 mL/min signify                          possible Chronic Kidney Disease.  Marland Kitchen Prothrombin Time 07/12/2012 23.1* 11.6 - 15.2 seconds Final  . INR 07/12/2012 2.15* 0.00 - 1.49 Final  . Hemoglobin 07/12/2012 9.6* 13.0 - 17.0 g/dL Final  . HCT 16/03/9603 28.4* 39.0 - 52.0 % Final  Hospital Outpatient Visit on 07/04/2012  Component Date Value Range Status  . aPTT 07/04/2012 39* 24 - 37 seconds Final   Comment:                                 IF BASELINE aPTT IS ELEVATED,                          SUGGEST PATIENT RISK ASSESSMENT                          BE USED TO DETERMINE APPROPRIATE                          ANTICOAGULANT THERAPY.  . Sodium 07/04/2012 139  135 - 145 mEq/L Final  . Potassium 07/04/2012 4.2  3.5 - 5.1 mEq/L Final  . Chloride 07/04/2012 101  96 - 112 mEq/L Final  . CO2 07/04/2012 32  19 - 32 mEq/L Final  . Glucose, Bld 07/04/2012 80   70 - 99 mg/dL Final  . BUN 54/02/8118 18  6 - 23 mg/dL Final  . Creatinine, Ser 07/04/2012 0.88  0.50 - 1.35 mg/dL Final  . Calcium 14/78/2956 9.2  8.4 - 10.5 mg/dL Final  . Total Protein 07/04/2012 7.1  6.0 - 8.3 g/dL Final  . Albumin 21/30/8657 3.4* 3.5 - 5.2 g/dL Final  . AST 84/69/6295 24  0 - 37 U/L Final  . ALT 07/04/2012 32  0 - 53 U/L Final  . Alkaline Phosphatase 07/04/2012 56  39 - 117 U/L Final  . Total Bilirubin 07/04/2012 0.2* 0.3 - 1.2 mg/dL Final  . GFR calc non Af Amer 07/04/2012 80* >90 mL/min Final  . GFR calc Af Amer 07/04/2012 >90  >90 mL/min Final   Comment:                                 The eGFR has been calculated                          using the CKD EPI equation.                          This calculation has not been                          validated in all clinical                          situations.                          eGFR's persistently                          <  90 mL/min signify                          possible Chronic Kidney Disease.  Marland Kitchen Prothrombin Time 07/04/2012 30.3* 11.6 - 15.2 seconds Final  . INR 07/04/2012 3.10* 0.00 - 1.49 Final  . Color, Urine 07/04/2012 YELLOW  YELLOW Final  . APPearance 07/04/2012 CLEAR  CLEAR Final  . Specific Gravity, Urine 07/04/2012 1.022  1.005 - 1.030 Final  . pH 07/04/2012 6.0  5.0 - 8.0 Final  . Glucose, UA 07/04/2012 NEGATIVE  NEGATIVE mg/dL Final  . Hgb urine dipstick 07/04/2012 NEGATIVE  NEGATIVE Final  . Bilirubin Urine 07/04/2012 NEGATIVE  NEGATIVE Final  . Ketones, ur 07/04/2012 NEGATIVE  NEGATIVE mg/dL Final  . Protein, ur 16/03/9603 NEGATIVE  NEGATIVE mg/dL Final  . Urobilinogen, UA 07/04/2012 0.2  0.0 - 1.0 mg/dL Final  . Nitrite 54/02/8118 NEGATIVE  NEGATIVE Final  . Leukocytes, UA 07/04/2012 NEGATIVE  NEGATIVE Final   MICROSCOPIC NOT DONE ON URINES WITH NEGATIVE PROTEIN, BLOOD, LEUKOCYTES, NITRITE, OR GLUCOSE <1000 mg/dL.  Marland Kitchen MRSA, PCR 07/04/2012 NEGATIVE  NEGATIVE Final  . Staphylococcus  aureus 07/04/2012 NEGATIVE  NEGATIVE Final   Comment:                                 The Xpert SA Assay (FDA                          approved for NASAL specimens                          in patients over 81 years of age),                          is one component of                          a comprehensive surveillance                          program.  Test performance has                          been validated by [employer] for patients greater                          than or equal to 32 year old.                          It is not intended                          to diagnose infection nor to                          guide or monitor treatment.  . WBC 07/04/2012 2.8* 4.0 - 10.5 K/uL Final  . RBC 07/04/2012 4.29  4.22 - 5.81 MIL/uL Final  . Hemoglobin 07/04/2012 13.1  13.0 -  17.0 g/dL Final  . HCT 16/03/9603 40.3  39.0 - 52.0 % Final  . MCV 07/04/2012 93.9  78.0 - 100.0 fL Final  . MCH 07/04/2012 30.5  26.0 - 34.0 pg Final  . MCHC 07/04/2012 32.5  30.0 - 36.0 g/dL Final  . RDW 54/02/8118 13.8  11.5 - 15.5 % Final  . Platelets 07/04/2012 175  150 - 400 K/uL Final     X-Rays:Dg Chest 2 View  07/04/2012  *RADIOLOGY REPORT*  Clinical Data: Preop respiratory exam for knee surgery. Hypertension.  CHEST - 2 VIEW  Comparison: 02/18/2010  Findings: Mild cardiomegaly and ectasia of the thoracic aorta are stable.  Pulmonary hyperinflation is noted.  Both lungs are clear. No evidence of pleural effusion.  No mass or lymphadenopathy identified.  Mild thoracic dextroscoliosis is stable.  IMPRESSION: Stable cardiomegaly and probable COPD.  No active disease.   Original Report Authenticated By: Myles Rosenthal, M.D.    Dg Knee Right Port  07/10/2012  *RADIOLOGY REPORT*  Clinical Data: Right total knee arthroplasty.  PORTABLE RIGHT KNEE - 1-2 VIEW  Comparison: None  Findings: The tibial and femoral components appear well seated.  No complicating features are demonstrated.   IMPRESSION: Well seated components of a total right knee arthroplasty.   Original Report Authenticated By: Rudie Meyer, M.D.     EKG: Orders placed during the hospital encounter of 07/04/12  . EKG 12-LEAD  . EKG 12-LEAD     Hospital Course: Jay Walters is a 79 y.o. who was admitted to [facility]. They were brought to the operating room on 07/10/2012 and underwent Procedure(s): TOTAL KNEE ARTHROPLASTY.  Patient tolerated the procedure well and was later transferred to the recovery room and then to the orthopaedic floor for postoperative care.  They were given PO and IV analgesics for pain control following their surgery.  They were given 24 hours of postoperative antibiotics of  Anti-infectives     Start     Dose/Rate Route Frequency Ordered Stop   07/10/12 1600   ceFAZolin (ANCEF) IVPB 1 g/50 mL premix        1 g 100 mL/hr over 30 Minutes Intravenous Every 6 hours 07/10/12 1531 07/10/12 2221   07/10/12 1052   polymyxin B 500,000 Units, bacitracin 50,000 Units in sodium chloride irrigation 0.9 % 500 mL irrigation  Status:  Discontinued          As needed 07/10/12 1053 07/10/12 1247   07/10/12 0600   ceFAZolin (ANCEF) IVPB 2 g/50 mL premix        2 g 100 mL/hr over 30 Minutes Intravenous On call to O.R. 07/09/12 1718 07/10/12 1022         and started on DVT prophylaxis in the form of Coumadin.   PT and OT were ordered for total joint protocol.  Discharge planning consulted to help with postop disposition and equipment needs.  Patient had a decent night on the evening of surgery but had difficulty sleeping. He started to get up OOB with therapy on day one but progressed slowly. Hemovac drain was pulled without difficulty.  Continued to work with therapy into day two, continuing to move slow, complaining of weakness.  Dressing was changed on day two and the incision was clean and dry.  The patient has some issues with hypotension. Fluids increased. Recheck of Hgb showed  improvement. Patient continued to show slow improvement with therapy. Patient was to be seen post op day three, most likely needing SNF placement due  to lack of progress with therapy. Incision healing well. Post op day three, the patient's Hgb had dropped significantly, requiring transfusion of two units. He responded well. He was doing well Sunday but SNF placement pushed discharge to Monday.    Discharge Medications: Prior to Admission medications   Medication Sig Start Date End Date Taking? Authorizing Provider  doxazosin (CARDURA) 4 MG tablet Take 4 mg by mouth every evening.    Yes Historical Provider, MD  finasteride (PROSCAR) 5 MG tablet Take 1 tablet by mouth every evening.  09/18/11  Yes Historical Provider, MD  OVER THE COUNTER MEDICATION Take 1 tablet by mouth daily. Tumeric 500 mg   Yes Historical Provider, MD  phenytoin (DILANTIN) 100 MG ER capsule Take 300 mg by mouth at bedtime.  04/15/12  Yes Historical Provider, MD  digoxin (LANOXIN) 0.125 MG tablet Take 0.0625 mg by mouth every other day. Takes 1/2 tablet every other day in the morning 09/15/11   Historical Provider, MD  ferrous sulfate 325 (65 FE) MG tablet Take 1 tablet (325 mg total) by mouth 3 (three) times daily after meals. 07/11/12   Dominik Lauricella Tamala Ser, PA  methocarbamol (ROBAXIN) 500 MG tablet Take 1 tablet (500 mg total) by mouth every 6 (six) hours as needed. 07/11/12   Jamon Hayhurst Tamala Ser, PA  oxyCODONE-acetaminophen (PERCOCET/ROXICET) 5-325 MG per tablet Take 1-2 tablets by mouth every 4 (four) hours as needed (Q4-6 hours PRN). 07/11/12   Oluwatoyin Banales Tamala Ser, PA  polyethylene glycol (MIRALAX / GLYCOLAX) packet Take 17 g by mouth daily as needed. 07/11/12   Shelonda Saxe Tamala Ser, PA  warfarin (COUMADIN) 5 MG tablet Take 5-7.5 mg by mouth daily. 1.5 tablets on sunday,  and fridays. Take 1 tablet the rest of the week.    Historical Provider, MD    Diet: Cardiac diet Activity:WBAT Follow-up:in 2 weeks Disposition -  Skilled nursing facility Discharged Condition: fair   Discharge Orders    Future Appointments: Provider: Department: Dept Phone: Center:   10/07/2012 9:00 AM [name], MD Sharlet [place], MD [phone] [facility]   10/08/2012 10:00 AM [name], MD Sharlet [place], MD [phone] [facility]     Future Orders Please Complete By Expires   Diet - low sodium heart healthy      Call MD / Call 911      Comments:   If you experience chest pain or shortness of breath, CALL 911 and be transported to the hospital emergency room.  If you develope a fever above 101 F, pus (white drainage) or increased drainage or redness at the wound, or calf pain, call your surgeon's office.   Constipation Prevention      Comments:   Drink plenty of fluids.  Prune juice may be helpful.  You may use a stool softener, such as Colace (over the counter) 100 mg twice a day.  Use MiraLax (over the counter) for constipation as needed.   Increase activity slowly as tolerated      Discharge instructions      Comments:   Walk with your walker. Weight bearing as instructed. Home Health Agency will follow you at home for your therapy and to manage your Coumadin. Change your dressing daily. Shower only, no tub bath. Call if any temperatures greater than 101 or any wound complications: [phone] during the day and ask for Dr. Jeannetta Ellis nurse, [facility]. Follow up in two weeks   Driving restrictions      Comments:   No driving  Change dressing      Comments:   Change dressing daily with sterile 4 x 4 inch gauze dressing   Do not put a pillow under the knee. Place it under the heel.          Medication List     As of 07/12/2012  2:33 PM    STOP taking these medications         aspirin 81 MG tablet      multivitamins ther. w/minerals Tabs      POLY-IRON 150 PO      Vitamin D3 1000 UNITS Caps      TAKE these medications         digoxin 0.125 MG tablet   Commonly known as: LANOXIN   Take 0.0625 mg by mouth  every other day. Takes 1/2 tablet every other day in the morning      doxazosin 4 MG tablet   Commonly known as: CARDURA   Take 4 mg by mouth every evening.      ferrous sulfate 325 (65 FE) MG tablet   Take 1 tablet (325 mg total) by mouth 3 (three) times daily after meals.      finasteride 5 MG tablet   Commonly known as: PROSCAR   Take 1 tablet by mouth every evening.      methocarbamol 500 MG tablet   Commonly known as: ROBAXIN   Take 1 tablet (500 mg total) by mouth every 6 (six) hours as needed.      OVER THE COUNTER MEDICATION   Take 1 tablet by mouth daily. Tumeric 500 mg      oxyCODONE-acetaminophen 5-325 MG per tablet   Commonly known as: PERCOCET/ROXICET   Take 1-2 tablets by mouth every 4 (four) hours as needed (Q4-6 hours PRN).      phenytoin 100 MG ER capsule   Commonly known as: DILANTIN   Take 300 mg by mouth at bedtime.      polyethylene glycol packet   Commonly known as: MIRALAX / GLYCOLAX   Take 17 g by mouth daily as needed.      warfarin 5 MG tablet   Commonly known as: COUMADIN   Take 5-7.5 mg by mouth daily. 1.5 tablets on sunday,  and fridays. Take 1 tablet the rest of the week.           Follow-up Information    Follow up with GIOFFRE,RONALD A, MD. In 2 weeks.   Contact information:   [address], [place] [address] [place] [address] [phone]          Signed: Kerby Nora 07/12/2012, 2:33 PM

## 2012-07-12 NOTE — Progress Notes (Signed)
D/C Summary has been faxed to [facility] for possible admission on SAT. Week end CSW will assist with d/c planning to SNF. Pt is in agreement with d/c plan.  Cori Razor LCSW [phone]

## 2012-07-12 NOTE — Progress Notes (Signed)
Physical Therapy Treatment Patient Details Name: Jay Walters MRN: [id number] DOB: 04/02/1932 Today's Date: 07/12/2012 Time: 6962-9528 PT Time Calculation (min): 44 min  PT Assessment / Plan / Recommendation Comments on Treatment Session       Follow Up Recommendations  SNF     Does the patient have the potential to tolerate intense rehabilitation     Barriers to Discharge        Equipment Recommendations  None recommended by PT    Recommendations for Other Services OT consult  Frequency 7X/week   Plan Discharge plan needs to be updated    Precautions / Restrictions Precautions Precautions: Knee;Fall Required Braces or Orthoses: Knee Immobilizer - Right Knee Immobilizer - Right: Discontinue once straight leg raise with < 10 degree lag Restrictions Weight Bearing Restrictions: No Other Position/Activity Restrictions: WBAT   Pertinent Vitals/Pain     Mobility  Transfers Transfers: Sit to Stand;Stand to Sit Sit to Stand: 1: +2 Total assist Sit to Stand: Patient Percentage: 70% Stand to Sit: 1: +2 Total assist Stand to Sit: Patient Percentage: 50% Details for Transfer Assistance: cues for LE management and use of UEs to self assist.; Physical assist to bring to upright position Ambulation/Gait Ambulation/Gait Assistance:  (stood only - no amb 2* dizzy and slowed cog response)    Exercises Total Joint Exercises Ankle Circles/Pumps: AROM;Supine;Both;20 reps Quad Sets: AROM;Both;Supine;20 reps Heel Slides: AAROM;Right;Supine;20 reps Straight Leg Raises: AAROM;Right;Supine;20 reps   PT Diagnosis:    PT Problem List:   PT Treatment Interventions:     PT Goals Acute Rehab PT Goals PT Goal Formulation: With patient Time For Goal Achievement: 07/17/12 Potential to Achieve Goals: Fair Pt will go Supine/Side to Sit: with supervision Pt will go Sit to Supine/Side: with supervision Pt will go Sit to Stand: with supervision PT Goal: Sit to Stand - Progress: Progressing  toward goal Pt will go Stand to Sit: with supervision PT Goal: Stand to Sit - Progress: Progressing toward goal Pt will Ambulate: 16 - 50 feet;with supervision;with rolling walker  Visit Information  Last PT Received On: 07/12/12 Assistance Needed: +2 (safety 2* bp)    Subjective Data  Subjective: I'm doing better today.  I just get a little lightheaded when I get up Patient Stated Goal: Resume previous lifestyle with decreased pain   Cognition  Overall Cognitive Status: Appears within functional limits for tasks assessed/performed Arousal/Alertness: Awake/alert Orientation Level: Appears intact for tasks assessed Behavior During Session: [facility] for tasks performed    Balance     End of Session PT - End of Session Equipment Utilized During Treatment: Gait belt;Right knee immobilizer Activity Tolerance: Other (comment) Patient left: in chair;with call bell/phone within reach Nurse Communication: Mobility status;Other (comment)   GP     Staci Carver 07/12/2012, 1:04 PM

## 2012-07-12 NOTE — Evaluation (Signed)
Occupational Therapy Evaluation Patient Details Name: Jay Walters MRN: [id number] DOB: [birth year] Today's Date: 07/12/2012 Time: 6213-0865 OT Time Calculation (min): 25 min  OT Assessment / Plan / Recommendation Clinical Impression  This 79 year old man was admitted for R TKA.  He has a h/o CVA and eval was limited by BP/dizziness today.  Pt will benefit from skilled OT to increase independence with adls.  Goals in acute are for min to mod A overall.      OT Assessment  Patient needs continued OT Services    Follow Up Recommendations  SNF    Barriers to Discharge      Equipment Recommendations  None recommended by OT    Recommendations for Other Services    Frequency  Min 2X/week    Precautions / Restrictions Precautions Precautions: Knee;Fall Required Braces or Orthoses: Knee Immobilizer - Right Knee Immobilizer - Right: Discontinue once straight leg raise with < 10 degree lag Restrictions Other Position/Activity Restrictions: WBAT   Pertinent Vitals/Pain A little pain--R knee; left with PT.  Dizzy when standing dynamap:  79/35    ADL  Grooming: Simulated;Set up Where Assessed - Grooming: Unsupported sitting Upper Body Bathing: Simulated;Set up Where Assessed - Upper Body Bathing: Unsupported sitting Lower Body Bathing: Simulated;+2 Total assistance Lower Body Bathing: Patient Percentage: 50% Where Assessed - Lower Body Bathing: Supported sit to stand Upper Body Dressing: Performed;Minimal assistance (iv) Where Assessed - Upper Body Dressing: Unsupported sitting Lower Body Dressing: Simulated;+2 Total assistance Lower Body Dressing: Patient Percentage: 30% Where Assessed - Lower Body Dressing: Supported sit to stand Toileting - Architect and Hygiene: Simulated;+2 Total assistance Toileting - Architect and Hygiene: Patient Percentage: 50% Where Assessed - Glass blower/designer Manipulation and Hygiene: Sit to stand from 3-in-1 or  toilet Equipment Used: Gait belt;Rolling walker Transfers/Ambulation Related to ADLs: only performed sit to stand because BP dropped and pt was less responsive verbally--BPs ran  in 80s to 79 systolic and 35-48 diastolic.  RN came in after OT left to perform manually.  ADL Comments: Pt used urinal at edge of chair.  He dropped tissue and needed min a for balance when reaching to floor    OT Diagnosis: Generalized weakness  OT Problem List: Decreased strength;Decreased activity tolerance;Impaired balance (sitting and/or standing);Decreased knowledge of use of DME or AE;Cardiopulmonary status limiting activity;Pain OT Treatment Interventions: Self-care/ADL training;DME and/or AE instruction;Patient/family education;Balance training;Therapeutic activities   OT Goals Acute Rehab OT Goals OT Goal Formulation: With patient Time For Goal Achievement: 07/19/12 Potential to Achieve Goals: Good ADL Goals Pt Will Perform Lower Body Bathing: with min assist;Sit to stand from chair (with ae) ADL Goal: Lower Body Bathing - Progress: Goal set today Pt Will Perform Lower Body Dressing: with mod assist;Sit to stand from chair;with adaptive equipment ADL Goal: Lower Body Dressing - Progress: Goal set today Pt Will Transfer to Toilet: with min assist;Stand pivot transfer;3-in-1 ADL Goal: Toilet Transfer - Progress: Goal set today Pt Will Perform Toileting - Hygiene: with min assist;Sit to stand from 3-in-1/toilet ADL Goal: Toileting - Hygiene - Progress: Goal set today  Visit Information  Last OT Received On: 07/12/12 Assistance Needed: +2 (safety 2* bp) PT/OT Co-Evaluation/Treatment: Yes    Subjective Data  Subjective: i think my bp runs low Patient Stated Goal: considering rehab because he doesn't feel he'll have what he needs at home   Prior Functioning     Home Living Lives With: Spouse Available Help at Discharge: Family Type of Home: House Home Access:  Stairs to enter [employer] of Steps: 2 Entrance Stairs-Rails: Right Home Layout: One level Bathroom Toilet: Standard Home Adaptive Equipment: Bedside commode/3-in-1;Walker - rolling Prior Function Level of Independence: Independent with assistive device(s) Vocation: Retired Musician: No difficulties Dominant Hand: Right         Vision/Perception     Cognition  Overall Cognitive Status: Appears within functional limits for tasks assessed/performed Arousal/Alertness: Awake/alert Orientation Level: Appears intact for tasks assessed Behavior During Session: [facility] for tasks performed    Extremity/Trunk Assessment Right Upper Extremity Assessment RUE ROM/Strength/Tone: [facility] for tasks assessed Left Upper Extremity Assessment LUE ROM/Strength/Tone: WFL for tasks assessed (sometimes has more difficulty moving due to arthritis)     Mobility Transfers Sit to Stand: 1: +2 Total assist Sit to Stand: Patient Percentage: 70% Details for Transfer Assistance: cues for hand placement     Shoulder Instructions     Exercise     Balance     End of Session OT - End of Session Activity Tolerance: Treatment limited secondary to medical complications (Comment) (decreased BP/dizziness; less responsiveness) Patient left: in chair;with call bell/phone within reach (with PT)  GO     [name] 07/12/2012, 12:33 PM Marica Otter, OTR/L [phone] 07/12/2012

## 2012-07-12 NOTE — Progress Notes (Signed)
ANTICOAGULATION CONSULT NOTE - Follow Up Consult  Pharmacy Consult for Warfarin Indication: Chronic Warfarin for Aflutter; VTE prophylaxis for R TKA  No Known Allergies  Patient Measurements: Height: 5\' 10"  (177.8 cm) Weight: 128 lb 12 oz (58.4 kg) IBW/kg (Calculated) : 73   Vital Signs: Temp: 100 F (37.8 C) (01/31 0700) Temp src: Oral (01/31 0700) BP: 128/62 mmHg (01/31 0549) Pulse Rate: 84  (01/31 0549)  Labs:  Basename 07/12/12 0419 07/11/12 0449 07/10/12 1558 07/10/12 0810  HGB 9.0* 9.8* -- --  HCT 26.1* 28.6* 34.0* --  PLT 117* 128* 146* --  APTT -- -- -- --  LABPROT 23.1* 15.9* -- 14.8  INR 2.15* 1.30 -- 1.18  HEPARINUNFRC -- -- -- --  CREATININE 0.91 0.82 0.79 --  CKTOTAL -- -- -- --  CKMB -- -- -- --  TROPONINI -- -- -- --   Estimated Creatinine Clearance: 55.3 ml/min (by C-G formula based on Cr of 0.91).  Medications:  Scheduled:    . digoxin  0.0625 mg Oral QODAY  . doxazosin  4 mg Oral QPM  . ferrous sulfate  325 mg Oral TID PC  . finasteride  5 mg Oral QPM  . phenytoin  300 mg Oral QHS  . [COMPLETED] warfarin  7.5 mg Oral ONCE-1800  . Warfarin - Pharmacist Dosing Inpatient   Does not apply q1800  . [DISCONTINUED] heparin subcutaneous  5,000 Units Subcutaneous Q8H    Assessment:  80 YOM s/p R TKA, on coumadin PTA for hx Aflutter, home dose PTA 7.5mg  Friday and Sunday and 5mg  other days, last taken 1/24.  Sq Heparin until INR tx (>/= 2.0)  INR 2.15 following 2 x 7.5mg  warfarin doses. Large increase from INR 1.30 yesterday. Eating well; regular diet from 1/29.  Also on Phenytoin PTA: can increase protime/ Warfarin can increase the effect of Phenytoin.   Goal of Therapy:  INR 2-3   Plan:  Discontinue SQ Heparin Will give 1 mg Coumadin today Plan home tomorrow Continue daily PT/INR  Otho Bellows PharmD Pager [phone] 07/12/2012,8:24 AM

## 2012-07-13 LAB — CBC
HCT: 21.9 % — ABNORMAL LOW (ref 39.0–52.0)
Hemoglobin: 7.4 g/dL — ABNORMAL LOW (ref 13.0–17.0)
MCHC: 33.8 g/dL (ref 30.0–36.0)
MCV: 91.6 fL (ref 78.0–100.0)
RDW: 13.7 % (ref 11.5–15.5)
WBC: 9.2 10*3/uL (ref 4.0–10.5)

## 2012-07-13 LAB — PROTIME-INR: INR: 2.44 — ABNORMAL HIGH (ref 0.00–1.49)

## 2012-07-13 LAB — HEMOGLOBIN AND HEMATOCRIT, BLOOD: Hemoglobin: 7 g/dL — ABNORMAL LOW (ref 13.0–17.0)

## 2012-07-13 LAB — PREPARE RBC (CROSSMATCH)

## 2012-07-13 MED ORDER — WARFARIN SODIUM 1 MG PO TABS
1.0000 mg | ORAL_TABLET | Freq: Once | ORAL | Status: AC
  Administered 2012-07-13: 1 mg via ORAL
  Filled 2012-07-13: qty 1

## 2012-07-13 MED ORDER — FUROSEMIDE 10 MG/ML IJ SOLN
20.0000 mg | Freq: Once | INTRAMUSCULAR | Status: AC
  Administered 2012-07-13: 20 mg via INTRAVENOUS
  Filled 2012-07-13: qty 2

## 2012-07-13 MED ORDER — DIPHENHYDRAMINE HCL 25 MG PO CAPS
25.0000 mg | ORAL_CAPSULE | Freq: Once | ORAL | Status: AC
  Administered 2012-07-13: 25 mg via ORAL
  Filled 2012-07-13: qty 1

## 2012-07-13 NOTE — Progress Notes (Signed)
Physical Therapy Treatment Patient Details Name: Jay Walters MRN: [id number] DOB: Nov 04, 1931 Today's Date: 07/13/2012 Time: 8657-8469 PT Time Calculation (min): 16 min  PT Assessment / Plan / Recommendation Comments on Treatment Session  Pt had just gotten done with unit of PRBC and RN stated to keep pt in bed.  Performed therex in bed.     Follow Up Recommendations  SNF     Does the patient have the potential to tolerate intense rehabilitation     Barriers to Discharge        Equipment Recommendations  None recommended by PT    Recommendations for Other Services    Frequency 7X/week   Plan Discharge plan remains appropriate    Precautions / Restrictions Precautions Precautions: Knee;Fall Required Braces or Orthoses: Knee Immobilizer - Right Knee Immobilizer - Right: Discontinue once straight leg raise with < 10 degree lag Restrictions Weight Bearing Restrictions: No Other Position/Activity Restrictions: WBAT   Pertinent Vitals/Pain 3/10    Mobility  Bed Mobility Bed Mobility: Not assessed Transfers Transfers: Not assessed Ambulation/Gait Ambulation/Gait Assistance: Not tested (comment)    Exercises Total Joint Exercises Ankle Circles/Pumps: AROM;Supine;Both;20 reps Quad Sets: AROM;Both;Supine;10 reps Heel Slides: AAROM;Right;Supine;10 reps Hip ABduction/ADduction: AAROM;Right;10 reps Straight Leg Raises: AAROM;Right;Supine;20 reps   PT Diagnosis:    PT Problem List:   PT Treatment Interventions:     PT Goals Acute Rehab PT Goals PT Goal Formulation: With patient Time For Goal Achievement: 07/17/12 Potential to Achieve Goals: Fair  Performed therex to increase strength and ROM to meet other mobility goals  Visit Information  Last PT Received On: 07/13/12 Assistance Needed: +2    Subjective Data  Subjective: I'm feeling ok Patient Stated Goal: Resume previous lifestyle with decreased pain   Cognition  Overall Cognitive Status: Appears within  functional limits for tasks assessed/performed Arousal/Alertness: Awake/alert Orientation Level: Appears intact for tasks assessed Behavior During Session: [facility] for tasks performed    Balance     End of Session PT - End of Session Equipment Utilized During Treatment: Gait belt;Right knee immobilizer Activity Tolerance: Patient tolerated treatment well Patient left: in bed;with call bell/phone within reach   GP     Vista Deck 07/13/2012, 4:54 PM

## 2012-07-13 NOTE — Progress Notes (Signed)
07/13/12 0700 Nursing Rexene Edison Pa called reg hgb 7.4 this am . Order received to draw hgb/hct now to verify count. Lab notified.

## 2012-07-13 NOTE — Progress Notes (Signed)
PT note:  Pt getting PRBC at this time with continued low BP.  Will check on pt in pm.    Thanks,  Harriet Butte, PT

## 2012-07-13 NOTE — Progress Notes (Signed)
ANTICOAGULATION CONSULT NOTE - Follow Up Consult  Pharmacy Consult for Warfarin Indication: Chronic Warfarin for Aflutter; VTE prophylaxis for R TKA  No Known Allergies  Patient Measurements: Height: 5\' 10"  (177.8 cm) Weight: 128 lb 12 oz (58.4 kg) IBW/kg (Calculated) : 73   Vital Signs: Temp: 100.1 F (37.8 C) (02/01 1504) Temp src: Oral (02/01 1504) BP: 104/54 mmHg (02/01 1504) Pulse Rate: 65  (02/01 1504)  Labs:  Basename 07/13/12 0711 07/13/12 0405 07/12/12 1357 07/12/12 0419 07/11/12 0449 07/10/12 1558  HGB 7.0* 7.4* -- -- -- --  HCT 20.0* 21.9* 28.4* -- -- --  PLT -- 119* -- 117* 128* --  APTT -- -- -- -- -- --  LABPROT -- 25.4* -- 23.1* 15.9* --  INR -- 2.44* -- 2.15* 1.30 --  HEPARINUNFRC -- -- -- -- -- --  CREATININE -- -- -- 0.91 0.82 0.79  CKTOTAL -- -- -- -- -- --  CKMB -- -- -- -- -- --  TROPONINI -- -- -- -- -- --   Estimated Creatinine Clearance: 55.3 ml/min (by C-G formula based on Cr of 0.91).  Medications:  Scheduled:     . digoxin  0.0625 mg Oral QODAY  . [COMPLETED] diphenhydrAMINE  25 mg Oral Once  . doxazosin  4 mg Oral QPM  . ferrous sulfate  325 mg Oral TID PC  . finasteride  5 mg Oral QPM  . furosemide  20 mg Intravenous Once  . phenytoin  300 mg Oral QHS  . [COMPLETED] warfarin  1 mg Oral ONCE-1800  . Warfarin - Pharmacist Dosing Inpatient   Does not apply q1800    Assessment:  70 YOM s/p R TKA, on coumadin PTA for hx Aflutter, home dose PTA 7.5mg  Friday and Sunday and 5mg  other days, last taken 1/24.  INR 2.44 following 2 x 7.5mg , 1mg  warfarin doses. INR still rising somewhat quickly  Also on Phenytoin PTA: can increase protime/ Warfarin can increase the effect of Phenytoin.   H/H down, pltc down; no bleeding reported. 2 units PRBC today  Goal of Therapy:  INR 2-3   Plan:  Will repeat 1 mg Coumadin today Plan home tomorrow Continue daily PT/INR  Gwen Her PharmD  [phone] 07/13/2012 3:32 PM

## 2012-07-13 NOTE — Progress Notes (Addendum)
Subjective: 3 Days Post-Op Procedure(s) (LRB): TOTAL KNEE ARTHROPLASTY (Right) Patient reports pain as mild.    Objective: Vital signs in last 24 hours: Temp:  [98.7 F (37.1 C)-100.9 F (38.3 C)] 100.1 F (37.8 C) (02/01 0659) Pulse Rate:  [63-76] 63  (02/01 0659) Resp:  [15-18] 16  (02/01 0659) BP: (101-129)/(48-58) 101/48 mmHg (02/01 0659) SpO2:  [90 %-99 %] 96 % (02/01 0659)  Intake/Output from previous day: 01/31 0701 - 02/01 0700 In: 2019 [P.O.:600; I.V.:1419] Out: 700 [Urine:700] Intake/Output this shift:     Basename 07/13/12 0405 07/12/12 1357 07/12/12 0419 07/11/12 0449 07/10/12 1558  HGB 7.4* 9.6* 9.0* 9.8* 11.5*    Basename 07/13/12 0405 07/12/12 1357 07/12/12 0419  WBC 9.2 -- 9.1  RBC 2.39* -- 2.86*  HCT 21.9* 28.4* --  PLT 119* -- 117*    Basename 07/12/12 0419 07/11/12 0449  NA 134* 134*  K 3.8 4.0  CL 98 100  CO2 27 27  BUN 14 12  CREATININE 0.91 0.82  GLUCOSE 123* 132*  CALCIUM 8.4 8.2*    Basename 07/13/12 0405 07/12/12 0419  LABPT -- --  INR 2.44* 2.15*    Incision: dressing C/D/I  Assessment/Plan: 3 Days Post-Op Procedure(s) (LRB): TOTAL KNEE ARTHROPLASTY (Right) Up with therapy D/C home vs SNF today  Garry Nicolini A 07/13/2012, 8:38 AM  Patient has decided to go to [facility] tomorrow

## 2012-07-14 LAB — TYPE AND SCREEN
ABO/RH(D): A POS
Antibody Screen: NEGATIVE
Unit division: 0

## 2012-07-14 LAB — PROTIME-INR
INR: 1.85 — ABNORMAL HIGH (ref 0.00–1.49)
Prothrombin Time: 20.7 seconds — ABNORMAL HIGH (ref 11.6–15.2)

## 2012-07-14 MED ORDER — WARFARIN SODIUM 2.5 MG PO TABS
2.5000 mg | ORAL_TABLET | Freq: Once | ORAL | Status: AC
  Administered 2012-07-14: 2.5 mg via ORAL
  Filled 2012-07-14: qty 1

## 2012-07-14 NOTE — Progress Notes (Signed)
Physical Therapy Treatment Patient Details Name: Jay Walters MRN: [id number] DOB: June 17, 1931 Today's Date: 07/14/2012 Time: 8469-6295 PT Time Calculation (min): 25 min  PT Assessment / Plan / Recommendation Comments on Treatment Session  POD # 4 R TKR with post Op anemia received blodd yesterday.  Assisted pt OOB to attempt amb however pt unable to progress past 2 feet. Very unstable balance with difficulty weight shifting and advancing either LE to functionally amb.  "All over balance instability". Pt plans to D/C to [facility] for ST Rehab.    Follow Up Recommendations  SNF     Does the patient have the potential to tolerate intense rehabilitation     Barriers to Discharge        Equipment Recommendations  None recommended by PT    Recommendations for Other Services    Frequency 7X/week   Plan Discharge plan remains appropriate    Precautions / Restrictions Precautions Precautions: Knee;Fall Precaution Comments: Instructed pt on KI use for amb Required Braces or Orthoses: Knee Immobilizer - Right Restrictions Weight Bearing Restrictions: No Other Position/Activity Restrictions: WBAT   Pertinent Vitals/Pain C/o 12/10 R knee pain RN notified Heat applied by RN "the Doctor has an order for heat"    Mobility  Bed Mobility Bed Mobility: Supine to Sit Supine to Sit: 2: Max assist Details for Bed Mobility Assistance: 75% VC's on proper tech and increased time with support also to R LE off bed Transfers Transfers: Sit to Stand;Stand to Sit Sit to Stand: 1: +2 Total assist;From bed Sit to Stand: Patient Percentage: 70% Stand to Sit: 1: +2 Total assist;To chair/3-in-1 Stand to Sit: Patient Percentage: 70% Details for Transfer Assistance: 75% VC's on proper tech, hand placement and demon poor "all over the place" balance with inability to self correct posture to upright midline. Ambulation/Gait Ambulation/Gait Assistance: 1: +2 Total assist Ambulation/Gait: Patient  Percentage: 50% Ambulation Distance (Feet): 2 Feet Assistive device: Rolling walker Ambulation/Gait Assistance Details: 75% VC's on proper sequencing and upright poature.  Great difficulty advancing either LE and great difficulty support self in upright posture. Gait Pattern: Step-to pattern;Decreased stance time - right;Left flexed knee in stance;Narrow base of support;Trunk flexed Gait velocity: decreased General Gait Details: Great difficulty adjusting self to midline and demon gross motor control difficulty.  "All over the place" balance instability.     PT Goals    Visit Information  Last PT Received On: 07/14/12 Assistance Needed: +2    Subjective Data  Subjective: I'm good   Cognition       Balance     End of Session PT - End of Session Equipment Utilized During Treatment: Gait belt Activity Tolerance: Patient limited by fatigue Patient left: in chair;with call bell/phone within reach   [name]  PTA [facility]  Acute  Rehab Pager      [phone]

## 2012-07-14 NOTE — Progress Notes (Signed)
CSW spoke with Jay Walters at [facility] to inform about Pt delay for d/c due to low lab values and that Pt would most like d/c to facility tomorrow.    Leron Croak, LCSWA [employer] [phone]

## 2012-07-14 NOTE — Progress Notes (Addendum)
Subjective: 4 Days Post-Op Procedure(s) (LRB): TOTAL KNEE ARTHROPLASTY (Right) Patient reports pain as moderate.   Denies chest pain SOB, dizziness. Objective: Vital signs in last 24 hours: Temp:  [99.3 F (37.4 C)-100.1 F (37.8 C)] 99.9 F (37.7 C) (02/02 0543) Pulse Rate:  [65-97] 97  (02/02 0543) Resp:  [15-18] 16  (02/02 0543) BP: (101-138)/(54-73) 138/73 mmHg (02/02 0543) SpO2:  [97 %-99 %] 99 % (02/02 0543)  Intake/Output from previous day: 02/01 0701 - 02/02 0700 In: 2197.5 [P.O.:1080; I.V.:855; Blood:262.5] Out: 1900 [Urine:1900] Intake/Output this shift: Total I/O In: 120 [P.O.:120] Out: 450 [Urine:450]   Basename 07/13/12 0711 07/13/12 0405 07/12/12 1357 07/12/12 0419  HGB 7.0* 7.4* 9.6* 9.0*    Basename 07/13/12 0711 07/13/12 0405 07/12/12 0419  WBC -- 9.2 9.1  RBC -- 2.39* 2.86*  HCT 20.0* 21.9* --  PLT -- 119* 117*    Basename 07/12/12 0419  NA 134*  K 3.8  CL 98  CO2 27  BUN 14  CREATININE 0.91  GLUCOSE 123*  CALCIUM 8.4    Basename 07/14/12 0440 07/13/12 0405  LABPT -- --  INR 1.85* 2.44*   Right lower leg Intact pulses distally Calf supple non tender Right knee wound benign blister at distal medial incision site. Slight effusion   Assessment/Plan: 4 Days Post-Op Procedure(s) (LRB): TOTAL KNEE ARTHROPLASTY (Right) Discharge to SNF most likely tomorrow  Tegaderm dressing applied to blister OOB with PT Check Hemglobin  Jay Walters 07/14/2012, 9:36 AM  Hbg/HCT improved 8.5/25.2 s/p 1 unit PRBC's

## 2012-07-14 NOTE — Progress Notes (Signed)
Physical Therapy Treatment Patient Details Name: Jay Walters MRN: [id number] DOB: 1932-01-23 Today's Date: 07/14/2012 Time: 2725-3664 PT Time Calculation (min): 32 min  PT Assessment / Plan / Recommendation Comments on Treatment Session  POD # 4 pm session.  Amb pt with EVA walker for increased support and was ablr to progress 22 feet.  Assisted back to bed then performed TKR TE's.  Pt plans to D/C to [facility] Monday for ST Rehab.    Follow Up Recommendations  SNF     Does the patient have the potential to tolerate intense rehabilitation     Barriers to Discharge        Equipment Recommendations  None recommended by PT    Recommendations for Other Services    Frequency 7X/week   Plan Discharge plan remains appropriate    Precautions / Restrictions Precautions Precautions: Knee Precaution Comments: Instructed pt on KI use for amb  Required Braces or Orthoses: Knee Immobilizer - Right Knee Immobilizer - Right: Discontinue once straight leg raise with < 10 degree lag Restrictions Weight Bearing Restrictions: No Other Position/Activity Restrictions: WBAT   Pertinent Vitals/Pain C/o 5/10 R knee pain during TE's Pre medicated Heat applied    Mobility  Bed Mobility Bed Mobility: Sit to Supine Supine to Sit: 2: Max assist Sit to Supine: 3: Mod assist Details for Bed Mobility Assistance: Mod assist to help pt back into bed Transfers Transfers: Sit to Stand;Stand to Sit Sit to Stand: 1: +2 Total assist;From chair/3-in-1 Sit to Stand: Patient Percentage: 70% Stand to Sit: 1: +2 Total assist;To bed Stand to Sit: Patient Percentage: 70% Details for Transfer Assistance: 75% VC's on proper tech and hand placement plus to extend R LE prior to sit/stand to decrease stress on knee Ambulation/Gait Ambulation/Gait Assistance: 1: +2 Total assist Ambulation/Gait: Patient Percentage: 70% Ambulation Distance (Feet): 22 Feet Assistive device: Eva walker Ambulation/Gait Assistance  Details: Used EVA walker for increased support as pt stated he has a bad R shoulder.  Amb from recliner in hallway back to bed with 50% VC's on proper sequencing and upright posture.  Did much better with EVA walker. Gait Pattern: Step-to pattern;Decreased stance time - right;Trunk flexed Gait velocity: decreased General Gait Details: Great difficulty adjusting self to midline and demon gross motor control difficulty.  "All over the place" balance instability.    Exercises Total Joint Exercises Ankle Circles/Pumps: AROM;Both;10 reps;Supine Quad Sets: AROM;Both;10 reps;Supine Gluteal Sets: AROM;Both;10 reps;Supine Towel Squeeze: AROM;Both;10 reps;Supine Short Arc Quad: AAROM;Right;5 reps;Supine Heel Slides: AAROM;Right;10 reps;Supine Hip ABduction/ADduction: AAROM;Right;10 reps;Supine Straight Leg Raises: AAROM;Right;10 reps;Supine    PT Goals    Visit Information  Last PT Received On: 07/14/12 Assistance Needed: +2    Subjective Data  Subjective: I did much better this afternoon Patient Stated Goal: Rehab at [facility]     End of Session PT - End of Session Equipment Utilized During Treatment: Gait belt;Right knee immobilizer Activity Tolerance: Patient tolerated treatment well Patient left: in bed;with call bell/phone within reach;Other (comment) (heat to R knee)   Jay Walters  PTA [facility]  Acute  Rehab Pager      [phone]

## 2012-07-14 NOTE — Progress Notes (Signed)
ANTICOAGULATION CONSULT NOTE - Follow Up Consult  Pharmacy Consult for Warfarin Indication: Chronic Warfarin for Aflutter; VTE prophylaxis for R TKA  No Known Allergies  Patient Measurements: Height: 5\' 10"  (177.8 cm) Weight: 128 lb 12 oz (58.4 kg) IBW/kg (Calculated) : 73   Vital Signs: Temp: 99.9 F (37.7 C) (02/02 0543) Temp src: Oral (02/02 0543) BP: 138/73 mmHg (02/02 0543) Pulse Rate: 97  (02/02 0543)  Labs:  Basename 07/14/12 0933 07/14/12 0440 07/13/12 0711 07/13/12 0405 07/12/12 0419  HGB 8.5* -- 7.0* -- --  HCT 25.2* -- 20.0* 21.9* --  PLT -- -- -- 119* 117*  APTT -- -- -- -- --  LABPROT -- 20.7* -- 25.4* 23.1*  INR -- 1.85* -- 2.44* 2.15*  HEPARINUNFRC -- -- -- -- --  CREATININE -- -- -- -- 0.91  CKTOTAL -- -- -- -- --  CKMB -- -- -- -- --  TROPONINI -- -- -- -- --   Estimated Creatinine Clearance: 55.3 ml/min (by C-G formula based on Cr of 0.91).  Medications:  Scheduled:     . digoxin  0.0625 mg Oral QODAY  . [COMPLETED] diphenhydrAMINE  25 mg Oral Once  . doxazosin  4 mg Oral QPM  . ferrous sulfate  325 mg Oral TID PC  . finasteride  5 mg Oral QPM  . [COMPLETED] furosemide  20 mg Intravenous Once  . phenytoin  300 mg Oral QHS  . [COMPLETED] warfarin  1 mg Oral ONCE-1800  . Warfarin - Pharmacist Dosing Inpatient   Does not apply q1800    Assessment:  12 YOM s/p R TKA, on coumadin PTA for hx Aflutter, home dose PTA 7.5mg  Friday and Sunday and 5mg  other days, last taken 1/24.  INR down, subtx after 2 x 7.5mg , 1mg  x 2. INR drop likely to recent decreased doses (INR was rising quickly)  Drug IX Phenytoin PTA: can increase protime/ Warfarin can increase the effect of Phenytoin.   H/H up after transfusion, no bleeding reported.   Goal of Therapy:  INR 2-3   Plan:  2.5 mg Coumadin today Plan home tomorrow Continue daily PT/INR  Gwen Her PharmD  [phone] 07/14/2012 12:06 PM

## 2012-07-15 LAB — PROTIME-INR: Prothrombin Time: 19.6 seconds — ABNORMAL HIGH (ref 11.6–15.2)

## 2012-07-15 NOTE — Care Management Note (Signed)
    Page 1 of 1   07/15/2012     10:45:13 AM   CARE MANAGEMENT NOTE 07/15/2012  Patient:  Jay Walters, Jay Walters   Account Number:  [id number]  Date Initiated:  07/11/2012  Documentation initiated by:  Colleen Can  Subjective/Objective Assessment:   dx osteoarthritits rt knee; total knee replacemnt; hx stroke with left sided weakness.     Action/Plan:   CM spoke with patient. Patient states he plans to discuss discharge plans with spouse. Physical therapy has recommended snf, hhpt   Anticipated DC Date:  07/22/2012   Anticipated DC Plan:  SKILLED NURSING FACILITY  In-house referral  Clinical Social Worker      DC Planning Services  CM consult      Choice offered to / List presented to:             Status of service:  Completed, signed off Medicare Important Message given?  NO (If response is "NO", the following Medicare IM given date fields will be blank) Date Medicare IM given:   Date Additional Medicare IM given:    Discharge Disposition:  SKILLED NURSING FACILITY  Per UR Regulation:    If discussed at Long Length of Stay Meetings, dates discussed:    Comments:

## 2012-07-15 NOTE — Progress Notes (Signed)
Clinical Social Work Department CLINICAL SOCIAL WORK PLACEMENT NOTE 07/15/2012  Patient:  Jay Walters, Jay Walters  Account Number:  [id number] Admit date:  07/10/2012  Clinical Social Worker:  Cori Razor, LCSW  Date/time:  07/12/2012 09:05 AM  Clinical Social Work is seeking post-discharge placement for this patient at the following level of care:   SKILLED NURSING   (*CSW will update this form in Epic as items are completed)   07/12/2012  Patient/family provided with [facility] Department of Clinical Social Work's list of facilities offering this level of care within the geographic area requested by the patient (or if unable, by the patient's family).  07/12/2012  Patient/family informed of their freedom to choose among providers that offer the needed level of care, that participate in Medicare, Medicaid or managed care program needed by the patient, have an available bed and are willing to accept the patient.    Patient/family informed of [facility]' ownership interest in [facility], as well as of the fact that they are under no obligation to receive care at this facility.  PASARR submitted to EDS on 07/11/2012 PASARR number received from EDS on 07/11/2012  FL2 transmitted to all facilities in geographic area requested by pt/family on  07/12/2012 FL2 transmitted to all facilities within larger geographic area on   Patient informed that his/her managed care company has contracts with or will negotiate with  certain facilities, including the following:     Patient/family informed of bed offers received:  07/12/2012 Patient chooses bed at [facility] Physician recommends and patient chooses bed at    Patient to be transferred to [facility] on  07/15/2012 Patient to be transferred to facility by [facility]  The following physician request were entered in Epic:   Additional Comments:  Cori Razor LCSW [phone]

## 2012-07-15 NOTE — Progress Notes (Signed)
Subjective: 5 Days Post-Op Procedure(s) (LRB): TOTAL KNEE ARTHROPLASTY (Right) Patient reports pain as 3 on 0-10 scale.  Doing better. Dressing Changed and wound looks fine. Small,inferior Pressure blister.  Objective: Vital signs in last 24 hours: Temp:  [98.5 F (36.9 C)-99 F (37.2 C)] 99 F (37.2 C) (02/03 0543) Pulse Rate:  [54-68] 67  (02/03 0543) Resp:  [16] 16  (02/03 0543) BP: (94-148)/(55-71) 148/71 mmHg (02/03 0543) SpO2:  [97 %-98 %] 97 % (02/03 0543)  Intake/Output from previous day: 02/02 0701 - 02/03 0700 In: 1656.7 [P.O.:420; I.V.:1236.7] Out: 1650 [Urine:1650] Intake/Output this shift:     Basename 07/14/12 0933 07/13/12 0711 07/13/12 0405 07/12/12 1357  HGB 8.5* 7.0* 7.4* 9.6*    Basename 07/14/12 0933 07/13/12 0711 07/13/12 0405  WBC -- -- 9.2  RBC -- -- 2.39*  HCT 25.2* 20.0* --  PLT -- -- 119*   No results found for this basename: NA:2,K:2,CL:2,CO2:2,BUN:2,CREATININE:2,GLUCOSE:2,CALCIUM:2 in the last 72 hours  Basename 07/15/12 0426 07/14/12 0440  LABPT -- --  INR 1.72* 1.85*    Neurologically intact No cellulitis present  Assessment/Plan: 5 Days Post-Op Procedure(s) (LRB): TOTAL KNEE ARTHROPLASTY (Right) Discharge to SNF  Jerone Cudmore A 07/15/2012, 7:19 AM

## 2012-07-15 NOTE — Progress Notes (Signed)
CSW will assist with d/c planning to [facility] if pt is ready for d/c today.  Cori Razor LCSW [phone]

## 2012-07-15 NOTE — Progress Notes (Signed)
Physical Therapy Treatment Patient Details Name: Jay Walters MRN: [id number] DOB: 1932/01/29 Today's Date: 07/15/2012 Time: 1034-1100 PT Time Calculation (min): 26 min  PT Assessment / Plan / Recommendation Comments on Treatment Session  POD# 5 R TKR.  Applied KI then assisted OOB to amb in hallway.  Pt plans to D/C to SNF today.    Follow Up Recommendations  SNF     Does the patient have the potential to tolerate intense rehabilitation     Barriers to Discharge        Equipment Recommendations  None recommended by PT    Recommendations for Other Services    Frequency 7X/week   Plan Discharge plan remains appropriate    Precautions / Restrictions Precautions Precautions: Knee Precaution Comments: Instructed pt on KI use for amb  Restrictions Weight Bearing Restrictions: No   Pertinent Vitals/Pain C/o 3/10 R knee pain during gait    Mobility  Bed Mobility Bed Mobility: Supine to Sit Supine to Sit: 3: Mod assist Sit to Supine: 3: Mod assist Details for Bed Mobility Assistance: Assisted pt OOB Transfers Transfers: Sit to Stand;Stand to Sit Sit to Stand: 1: +2 Total assist;From bed Sit to Stand: Patient Percentage: 70% Stand to Sit: 1: +2 Total assist;To chair/3-in-1 Details for Transfer Assistance: 50% VC's on proper tech  and hand placement Ambulation/Gait Ambulation/Gait Assistance: 1: +2 Total assist Ambulation/Gait: Patient Percentage: 70% Ambulation Distance (Feet): 48 Feet Assistive device: Eva walker Ambulation/Gait Assistance Details: Used EVA walker for increased support as pt stated he has a bad R shoulder. Gait Pattern: Step-to pattern Gait velocity: decreased     PT Goals    Visit Information  Last PT Received On: 07/15/12 Assistance Needed: +2    Subjective Data  Subjective: I'll try Patient Stated Goal: Rehab at [facility]     End of Session PT - End of Session Equipment Utilized During Treatment: Gait  belt Activity Tolerance: Patient limited by fatigue Patient left: in chair;with call bell/phone within reach   [name]  PTA [facility]  Acute  Rehab Pager      [phone]

## 2012-07-28 ENCOUNTER — Inpatient Hospital Stay (HOSPITAL_COMMUNITY)
Admission: EM | Admit: 2012-07-28 | Discharge: 2012-08-02 | DRG: 885 | Disposition: A | Discharge status: Skilled Nursing Facility | Payer: 59 | Attending: Family Medicine | Admitting: Family Medicine

## 2012-07-28 ENCOUNTER — Encounter (HOSPITAL_COMMUNITY): Payer: Self-pay

## 2012-07-28 ENCOUNTER — Emergency Department (HOSPITAL_COMMUNITY): Payer: 59

## 2012-07-28 DIAGNOSIS — I10 Essential (primary) hypertension: Secondary | ICD-10-CM | POA: Diagnosis present

## 2012-07-28 DIAGNOSIS — N390 Urinary tract infection, site not specified: Secondary | ICD-10-CM

## 2012-07-28 DIAGNOSIS — M129 Arthropathy, unspecified: Secondary | ICD-10-CM | POA: Diagnosis present

## 2012-07-28 DIAGNOSIS — B965 Pseudomonas (aeruginosa) (mallei) (pseudomallei) as the cause of diseases classified elsewhere: Secondary | ICD-10-CM | POA: Diagnosis present

## 2012-07-28 DIAGNOSIS — I69998 Other sequelae following unspecified cerebrovascular disease: Secondary | ICD-10-CM

## 2012-07-28 DIAGNOSIS — F0390 Unspecified dementia without behavioral disturbance: Secondary | ICD-10-CM | POA: Diagnosis present

## 2012-07-28 DIAGNOSIS — R9389 Abnormal findings on diagnostic imaging of other specified body structures: Secondary | ICD-10-CM

## 2012-07-28 DIAGNOSIS — R1319 Other dysphagia: Secondary | ICD-10-CM | POA: Diagnosis present

## 2012-07-28 DIAGNOSIS — F29 Unspecified psychosis not due to a substance or known physiological condition: Principal | ICD-10-CM | POA: Diagnosis present

## 2012-07-28 DIAGNOSIS — F19921 Other psychoactive substance use, unspecified with intoxication with delirium: Secondary | ICD-10-CM | POA: Diagnosis present

## 2012-07-28 DIAGNOSIS — Y9210 Unspecified residential institution as the place of occurrence of the external cause: Secondary | ICD-10-CM | POA: Diagnosis present

## 2012-07-28 DIAGNOSIS — N400 Enlarged prostate without lower urinary tract symptoms: Secondary | ICD-10-CM | POA: Diagnosis not present

## 2012-07-28 DIAGNOSIS — I4892 Unspecified atrial flutter: Secondary | ICD-10-CM

## 2012-07-28 DIAGNOSIS — Y831 Surgical operation with implant of artificial internal device as the cause of abnormal reaction of the patient, or of later complication, without mention of misadventure at the time of the procedure: Secondary | ICD-10-CM | POA: Diagnosis present

## 2012-07-28 DIAGNOSIS — K219 Gastro-esophageal reflux disease without esophagitis: Secondary | ICD-10-CM | POA: Diagnosis present

## 2012-07-28 DIAGNOSIS — Z79899 Other long term (current) drug therapy: Secondary | ICD-10-CM

## 2012-07-28 DIAGNOSIS — G40909 Epilepsy, unspecified, not intractable, without status epilepticus: Secondary | ICD-10-CM | POA: Diagnosis present

## 2012-07-28 DIAGNOSIS — Z96659 Presence of unspecified artificial knee joint: Secondary | ICD-10-CM

## 2012-07-28 DIAGNOSIS — Z7901 Long term (current) use of anticoagulants: Secondary | ICD-10-CM

## 2012-07-28 DIAGNOSIS — R4789 Other speech disturbances: Secondary | ICD-10-CM | POA: Diagnosis present

## 2012-07-28 DIAGNOSIS — T420X1A Poisoning by hydantoin derivatives, accidental (unintentional), initial encounter: Secondary | ICD-10-CM | POA: Diagnosis not present

## 2012-07-28 DIAGNOSIS — R29898 Other symptoms and signs involving the musculoskeletal system: Secondary | ICD-10-CM | POA: Diagnosis present

## 2012-07-28 DIAGNOSIS — D62 Acute posthemorrhagic anemia: Secondary | ICD-10-CM

## 2012-07-28 DIAGNOSIS — T420X5A Adverse effect of hydantoin derivatives, initial encounter: Secondary | ICD-10-CM | POA: Diagnosis present

## 2012-07-28 DIAGNOSIS — T8489XA Other specified complication of internal orthopedic prosthetic devices, implants and grafts, initial encounter: Secondary | ICD-10-CM | POA: Diagnosis present

## 2012-07-28 LAB — CBC WITH DIFFERENTIAL/PLATELET
Basophils Relative: 1 % (ref 0–1)
Eosinophils Absolute: 0 10*3/uL (ref 0.0–0.7)
Eosinophils Relative: 0 % (ref 0–5)
Hemoglobin: 11.2 g/dL — ABNORMAL LOW (ref 13.0–17.0)
Lymphs Abs: 0.8 10*3/uL (ref 0.7–4.0)
MCH: 30.7 pg (ref 26.0–34.0)
MCHC: 32.2 g/dL (ref 30.0–36.0)
MCV: 95.3 fL (ref 78.0–100.0)
Monocytes Absolute: 0.5 10*3/uL (ref 0.1–1.0)
Monocytes Relative: 8 % (ref 3–12)
Neutrophils Relative %: 79 % — ABNORMAL HIGH (ref 43–77)
RBC: 3.65 MIL/uL — ABNORMAL LOW (ref 4.22–5.81)

## 2012-07-28 LAB — BASIC METABOLIC PANEL
BUN: 19 mg/dL (ref 6–23)
Calcium: 9.4 mg/dL (ref 8.4–10.5)
Creatinine, Ser: 0.73 mg/dL (ref 0.50–1.35)
GFR calc Af Amer: >90 mL/min (ref >90)
GFR calc non Af Amer: 87 mL/min — ABNORMAL LOW (ref >90)
Glucose, Bld: 119 mg/dL — ABNORMAL HIGH (ref 70–99)

## 2012-07-28 LAB — URINALYSIS, ROUTINE W REFLEX MICROSCOPIC
Glucose, UA: NEGATIVE mg/dL
Ketones, ur: 15 mg/dL — AB
Nitrite: POSITIVE — AB
Protein, ur: NEGATIVE mg/dL
Urobilinogen, UA: 2 mg/dL — ABNORMAL HIGH (ref 0.0–1.0)

## 2012-07-28 LAB — PROTIME-INR: Prothrombin Time: 30.7 seconds — ABNORMAL HIGH (ref 11.6–15.2)

## 2012-07-28 LAB — URINE MICROSCOPIC-ADD ON

## 2012-07-28 LAB — PHENYTOIN LEVEL, TOTAL: Phenytoin Lvl: 41.7 ug/mL (ref 10.0–20.0)

## 2012-07-28 MED ORDER — DEXTROSE 5 % IV SOLN
1.0000 g | INTRAVENOUS | Status: DC
  Administered 2012-07-28 – 2012-07-29 (×2): 1 g via INTRAVENOUS
  Filled 2012-07-28 (×3): qty 10

## 2012-07-28 MED ORDER — FERROUS SULFATE 325 (65 FE) MG PO TABS
325.0000 mg | ORAL_TABLET | Freq: Two times a day (BID) | ORAL | Status: DC
  Administered 2012-07-28 – 2012-08-02 (×11): 325 mg via ORAL
  Filled 2012-07-28 (×12): qty 1

## 2012-07-28 MED ORDER — ACETAMINOPHEN 325 MG PO TABS: 650.0000 mg | ORAL_TABLET | Freq: Four times a day (QID) | ORAL | Status: DC | PRN

## 2012-07-28 MED ORDER — FINASTERIDE 5 MG PO TABS
5.0000 mg | ORAL_TABLET | Freq: Every evening | ORAL | Status: DC
  Administered 2012-07-28 – 2012-08-02 (×6): 5 mg via ORAL
  Filled 2012-07-28 (×6): qty 1

## 2012-07-28 MED ORDER — ONDANSETRON HCL 4 MG/2ML IJ SOLN: 4.0000 mg | Freq: Four times a day (QID) | INTRAMUSCULAR | Status: DC | PRN

## 2012-07-28 MED ORDER — DOXAZOSIN MESYLATE 4 MG PO TABS
4.0000 mg | ORAL_TABLET | Freq: Every evening | ORAL | Status: DC
  Administered 2012-07-28 – 2012-08-02 (×6): 4 mg via ORAL
  Filled 2012-07-28 (×6): qty 1

## 2012-07-28 MED ORDER — SODIUM CHLORIDE 0.9 % IV SOLN
INTRAVENOUS | Status: DC
  Administered 2012-07-28: 19:00:00 via INTRAVENOUS

## 2012-07-28 MED ORDER — SODIUM CHLORIDE 0.9 % IJ SOLN
3.0000 mL | Freq: Two times a day (BID) | INTRAMUSCULAR | Status: DC
  Administered 2012-07-29 – 2012-08-02 (×5): 3 mL via INTRAVENOUS

## 2012-07-28 MED ORDER — ALBUTEROL SULFATE (5 MG/ML) 0.5% IN NEBU: 2.5000 mg | INHALATION_SOLUTION | RESPIRATORY_TRACT | Status: DC | PRN

## 2012-07-28 MED ORDER — GUAIFENESIN-DM 100-10 MG/5ML PO SYRP: 5.0000 mL | ORAL_SOLUTION | ORAL | Status: DC | PRN

## 2012-07-28 MED ORDER — HYDROCODONE-ACETAMINOPHEN 5-325 MG PO TABS
1.0000 | ORAL_TABLET | ORAL | Status: DC | PRN
  Administered 2012-07-29: 1 via ORAL
  Filled 2012-07-28: qty 1

## 2012-07-28 MED ORDER — METHOCARBAMOL 500 MG PO TABS
500.0000 mg | ORAL_TABLET | Freq: Four times a day (QID) | ORAL | Status: DC | PRN
  Administered 2012-07-29: 500 mg via ORAL
  Filled 2012-07-28: qty 1

## 2012-07-28 MED ORDER — ONDANSETRON HCL 4 MG PO TABS: 4.0000 mg | ORAL_TABLET | Freq: Four times a day (QID) | ORAL | Status: DC | PRN

## 2012-07-28 MED ORDER — DEXTROSE 5 % IV SOLN
1.0000 g | Freq: Once | INTRAVENOUS | Status: AC
  Administered 2012-07-28: 1 g via INTRAVENOUS
  Filled 2012-07-28: qty 10

## 2012-07-28 MED ORDER — DIGOXIN 0.0625 MG HALF TABLET
0.0625 mg | ORAL_TABLET | ORAL | Status: DC
  Administered 2012-07-30 – 2012-08-01 (×2): 0.0625 mg via ORAL
  Filled 2012-07-28 (×3): qty 1

## 2012-07-28 MED ORDER — POLYETHYLENE GLYCOL 3350 17 G PO PACK
17.0000 g | PACK | Freq: Every day | ORAL | Status: DC
  Administered 2012-07-28 – 2012-08-02 (×6): 17 g via ORAL
  Filled 2012-07-28 (×6): qty 1

## 2012-07-28 MED ORDER — ASPIRIN EC 81 MG PO TBEC
81.0000 mg | DELAYED_RELEASE_TABLET | Freq: Every day | ORAL | Status: DC
  Administered 2012-07-28 – 2012-08-02 (×6): 81 mg via ORAL
  Filled 2012-07-28 (×6): qty 1

## 2012-07-28 MED ORDER — ACETAMINOPHEN 650 MG RE SUPP: 650.0000 mg | Freq: Four times a day (QID) | RECTAL | Status: DC | PRN

## 2012-07-28 NOTE — Clinical Social Work Psychosocial (Signed)
CSW contacted by ED RN. Psychosocial on patient's shadow chart. Patient has been at [facility] for short term rehabilitation with goal to return home. Patient, patient's wife and patient's adult son stating will contact nursing supervisor at [facility], but family would like to pursue alternative SNF placement. Provided son with SNF list. Left message at home number, per son's request, with weekday CSW name and phone number. Weekday CSW will follow up with patient and family regarding change of SNF placement.  Ricke Hey, [place] [phone] (weekend)

## 2012-07-28 NOTE — ED Notes (Signed)
Attempted to collect urine, pt unable to void.  

## 2012-07-28 NOTE — ED Provider Notes (Addendum)
History     CSN: [id number]  Arrival date & time 07/28/12  1003   First MD Initiated Contact with Patient 07/28/12 1007      Chief Complaint  Patient presents with  . resolved garbled speech     (Consider location/radiation/quality/duration/timing/severity/associated sxs/prior treatment) HPI Comments: Patient was sent to the ER from nursing home rehabilitation for evaluation of speech disturbance. Patient was noted to have slurred and garbled speech earlier today. Patient does remember this occurring. He does not think he had any facial droop, numbness or tingling. Symptoms are now resolved. Patient is in rehabilitation after knee replacement surgery.   Past Medical History  Diagnosis Date  . Dysphagia     with solids  . Esophageal stricture   . Diverticulosis   . Osteopenia   . Hemorrhoids   . High cholesterol   . Heart murmur   . H/O hiatal hernia   . Hypertension   . Atrial flutter     typical appearing by ekg. s/p ablation 07/28/11  . Atrial fibrillation     remotely   . Sinus bradycardia     asymptomatic  . Stroke 2012    "left me weaker on left side; balance is not good"  . GERD (gastroesophageal reflux disease)   . Seizures     "back w/migraine headaches; 1990's or before"  . Arthritis     "right hip; right knee; lower back ~ 1/2 way across"  . Anemia     Past Surgical History  Procedure Laterality Date  . Asd repair  1972    "and repaired my aortic valve, I think"  . Knee arthroscopy      right; "maybe twice"  . Inguinal hernia repair  2011    right  . Cardiac electrophysiology study and ablation  07/28/11  . Total knee arthroplasty  07/10/2012    Procedure: TOTAL KNEE ARTHROPLASTY;  Surgeon: Jacki Cones, MD;  Location: [facility];  Service: Orthopedics;  Laterality: Right;  . Joint replacement      right knee replacement    Family History  Problem Relation Age of Onset  . Heart disease Father   . Cancer Sister     unsure what type    History    Substance Use Topics  . Smoking status: Never Smoker   . Smokeless tobacco: Never Used  . Alcohol Use: 0.0 oz/week     Comment: 07/28/11 "1/5 will last me a year; w/company"      Review of Systems  Musculoskeletal: Positive for joint swelling.  Neurological: Positive for speech difficulty. Negative for headaches.  All other systems reviewed and are negative.    Allergies  Review of patient's allergies indicates no known allergies.  Home Medications   Current Outpatient Rx  Name  Route  Sig  Dispense  Refill  . digoxin (LANOXIN) 0.125 MG tablet   Oral   Take 0.0625 mg by mouth every other day. Takes 1/2 tablet every other day in the morning         . doxazosin (CARDURA) 4 MG tablet   Oral   Take 4 mg by mouth every evening.          . ferrous sulfate 325 (65 FE) MG tablet   Oral   Take 1 tablet (325 mg total) by mouth 3 (three) times daily after meals.   45 tablet   0   . finasteride (PROSCAR) 5 MG tablet   Oral   Take 1 tablet by mouth  every evening.          . methocarbamol (ROBAXIN) 500 MG tablet   Oral   Take 1 tablet (500 mg total) by mouth every 6 (six) hours as needed.   40 tablet   1   . OVER THE COUNTER MEDICATION   Oral   Take 1 tablet by mouth daily. Tumeric 500 mg         . oxyCODONE-acetaminophen (PERCOCET/ROXICET) 5-325 MG per tablet   Oral   Take 1-2 tablets by mouth every 4 (four) hours as needed (Q4-6 hours PRN).   60 tablet   0   . phenytoin (DILANTIN) 100 MG ER capsule   Oral   Take 300 mg by mouth at bedtime.          . polyethylene glycol (MIRALAX / GLYCOLAX) packet   Oral   Take 17 g by mouth daily as needed.   14 each   0   . warfarin (COUMADIN) 5 MG tablet   Oral   Take 5-7.5 mg by mouth daily. 1.5 tablets on sunday,  and fridays. Take 1 tablet the rest of the week.           BP 143/68  Pulse 66  Temp(Src) 98.4 F (36.9 C) (Oral)  Resp 20  SpO2 100%  Physical Exam  Constitutional: He is oriented to  person, place, and time. He appears well-developed and well-nourished. No distress.  HENT:  Head: Normocephalic and atraumatic.  Right Ear: Hearing normal.  Nose: Nose normal.  Mouth/Throat: Oropharynx is clear and moist and mucous membranes are normal.  Eyes: Conjunctivae and EOM are normal. Pupils are equal, round, and reactive to light.  Neck: Normal range of motion. Neck supple.  Cardiovascular: Normal rate, regular rhythm, S1 normal and S2 normal.  Exam reveals no gallop and no friction rub.   No murmur heard. Pulmonary/Chest: Effort normal and breath sounds normal. No respiratory distress. He exhibits no tenderness.  Abdominal: Soft. Normal appearance and bowel sounds are normal. There is no hepatosplenomegaly. There is no tenderness. There is no rebound, no guarding, no tenderness at McBurney's point and negative Denver's sign. No hernia.  Musculoskeletal: Normal range of motion.       Right knee: He exhibits swelling.       Legs: Neurological: He is alert and oriented to person, place, and time. He has normal strength. No cranial nerve deficit or sensory deficit. Coordination normal. GCS eye subscore is 4. GCS verbal subscore is 5. GCS motor subscore is 6.  Skin: Skin is warm, dry and intact. No rash noted. No cyanosis.  Psychiatric: He has a normal mood and affect. His speech is normal and behavior is normal. Thought content normal.    ED Course  Procedures (including critical care time)   Date: 07/28/2012  Rate: 60  Rhythm: normal sinus rhythm  QRS Axis: normal  Intervals: PR prolonged  ST/T Wave abnormalities: LVH  Conduction Disutrbances:first-degree A-V block   Narrative Interpretation:   Old EKG Reviewed: unchanged    Labs Reviewed  CBC WITH DIFFERENTIAL - Abnormal; Notable for the following:    RBC 3.65 (*)    Hemoglobin 11.2 (*)    HCT 34.8 (*)    Platelets 478 (*)    Neutrophils Relative 79 (*)    All other components within normal limits  BASIC METABOLIC  PANEL - Abnormal; Notable for the following:    Glucose, Bld 119 (*)    GFR calc non Af Amer 87 (*)  All other components within normal limits  PROTIME-INR - Abnormal; Notable for the following:    Prothrombin Time 30.7 (*)    INR 3.16 (*)    All other components within normal limits  DIGOXIN LEVEL - Abnormal; Notable for the following:    Digoxin Level <0.3 (*)    All other components within normal limits  PHENYTOIN LEVEL, TOTAL - Abnormal; Notable for the following:    Phenytoin Lvl 41.7 (*)    All other components within normal limits  URINALYSIS, ROUTINE W REFLEX MICROSCOPIC   Ct Head Wo Contrast  07/28/2012  *RADIOLOGY REPORT*  Clinical Data: 79 year old male with slurred speech.  CT HEAD WITHOUT CONTRAST  Technique:  Contiguous axial images were obtained from the base of the skull through the vertex without contrast.  Comparison: 05/11/2011 and prior studies  Findings: Moderate chronic small vessel white matter ischemic changes are noted.  No acute intracranial abnormalities are identified, including mass lesion or mass effect, hydrocephalus, extra-axial fluid collection, midline shift, hemorrhage, or acute infarction.  The visualized bony calvarium is unremarkable.  IMPRESSION: No evidence of acute intracranial abnormality.  Chronic small vessel white matter ischemic changes.   Original Report Authenticated By: Harmon Pier, M.D.      Diagnosis: Dilantin toxicity    MDM  Patient presents to the ER with slurred speech which now appears to be resolved. Patient underwent right total knee replacement 2 weeks ago and is in rehabilitation currently. The wound looks to be healing appropriately, no concern for infection. Patient had a nonfocal neurologic examination and CT scan was normal. Patient does have a previous history of seizure disorder. There was not any witnessed seizure activity today. Dilantin level, however, is 41.7. I believe this explains the patient's symptoms. He will  require hospitalization for further monitoring.    Addendum: Urinalysis has not returned and patient has urinary tract infection as well. Patient administered Rocephin, urine culture sent.    Gilda Crease, MD 07/28/12 1318  Gilda Crease, MD 07/28/12 1415

## 2012-07-28 NOTE — H&P (Signed)
PATIENT DETAILS Name: Jay Walters Age: 79 y.o. Sex: male Date of Birth: [birth year] Admit Date: 07/28/2012 ZOX:WRUEAV,WUJW J, MD   CHIEF COMPLAINT:  Slurred speech and disorientation  HPI: Patient is a 79 year old black male with a past medical history of recent right knee replacement, history of CVA, history of a flutter/A. fib status post ablation and on chronic Coumadin therapy, hypertension who presented from his skilled nursing facility for the above noted complaint. The patient in the ED staff, apparently he was noted to have disorientation and slurred speech this a.m. He was then brought to the emergency room, where he had a CT of the head that was negative for acute intracranial abnormalities. Further lab work revealed a Dilantin level of 41.7. I was subsequently called to admit this patient for further evaluation and treatment. During my evaluation, patient still had some speech difficulty and some slight disorientation, but was able to provide much of a history himself. There is no history of fever or headache. There is no history of chest pain or shortness of breath. There is no history of abdominal pain or nausea or vomiting.   ALLERGIES:  No Known Allergies  PAST MEDICAL HISTORY: Past Medical History  Diagnosis Date  . Dysphagia     with solids  . Esophageal stricture   . Diverticulosis   . Osteopenia   . Hemorrhoids   . High cholesterol   . Heart murmur   . H/O hiatal hernia   . Hypertension   . Atrial flutter     typical appearing by ekg. s/p ablation 07/28/11  . Atrial fibrillation     remotely   . Sinus bradycardia     asymptomatic  . Stroke 2012    "left me weaker on left side; balance is not good"  . GERD (gastroesophageal reflux disease)   . Seizures     "back w/migraine headaches; 1990's or before"  . Arthritis     "right hip; right knee; lower back ~ 1/2 way across"  . Anemia     PAST SURGICAL HISTORY: Past Surgical History  Procedure Laterality  Date  . Asd repair  1972    "and repaired my aortic valve, I think"  . Knee arthroscopy      right; "maybe twice"  . Inguinal hernia repair  2011    right  . Cardiac electrophysiology study and ablation  07/28/11  . Total knee arthroplasty  07/10/2012    Procedure: TOTAL KNEE ARTHROPLASTY;  Surgeon: Jacki Cones, MD;  Location: [facility];  Service: Orthopedics;  Laterality: Right;  . Joint replacement      right knee replacement    MEDICATIONS AT HOME: Prior to Admission medications   Medication Sig Start Date End Date Taking? Authorizing Provider  digoxin (LANOXIN) 0.125 MG tablet Take 0.0625 mg by mouth every other day. Takes 1/2 tablet every other day in the morning 09/15/11  Yes Historical Provider, MD  doxazosin (CARDURA) 4 MG tablet Take 4 mg by mouth every evening.    Yes Historical Provider, MD  ferrous sulfate 325 (65 FE) MG tablet Take 325 mg by mouth 2 (two) times daily after a meal.    Yes Historical Provider, MD  finasteride (PROSCAR) 5 MG tablet Take 1 tablet by mouth every evening.  09/18/11  Yes Historical Provider, MD  methocarbamol (ROBAXIN) 500 MG tablet Take 1 tablet (500 mg total) by mouth every 6 (six) hours as needed. 07/11/12  Yes Amber Tamala Ser, PA  oxyCODONE-acetaminophen (PERCOCET/ROXICET) 5-325 MG  per tablet Take 1-2 tablets by mouth every 4 (four) hours as needed (Q4-6 hours PRN). 07/11/12  Yes Amber Tamala Ser, PA  phenytoin (DILANTIN) 100 MG ER capsule Take 300 mg by mouth at bedtime.  04/15/12  Yes Historical Provider, MD  polyethylene glycol (MIRALAX / GLYCOLAX) packet Take 17 g by mouth daily as needed. 07/11/12  Yes Amber Tamala Ser, PA  warfarin (COUMADIN) 5 MG tablet Take 5-7.5 mg by mouth daily. 1.5 tablets on sunday,  and fridays. Take 1 tablet the rest of the week.   Yes Historical Provider, MD    FAMILY HISTORY: Family History  Problem Relation Age of Onset  . Heart disease Father   . Cancer Sister     unsure what type    SOCIAL  HISTORY:  reports that he has never smoked. He has never used smokeless tobacco. He reports that  drinks alcohol. He reports that he does not use illicit drugs.  REVIEW OF SYSTEMS:  Constitutional:   No  weight loss, night sweats,  Fevers, chills, fatigue.  HEENT:    No headaches, Difficulty swallowing,Tooth/dental problems,Sore throat,  No sneezing, itching, ear ache, nasal congestion, post nasal drip,   Cardio-vascular: No chest pain,  Orthopnea, PND, swelling in lower extremities, anasarca,  dizziness, palpitations  GI:  No heartburn, indigestion, abdominal pain, nausea, vomiting, diarrhea, change in  bowel habits, loss of appetite  Resp: No shortness of breath with exertion or at rest.  No excess mucus, no productive cough, No non-productive cough,  No coughing up of blood.No change in color of mucus.No wheezing.No chest wall deformity  Skin:  no rash or lesions.  GU:  no dysuria, change in color of urine, no urgency or frequency.  No flank pain.  Musculoskeletal: No joint pain or swelling.  No decreased range of motion.  No back pain.  Psych: No change in mood or affect. No depression or anxiety.  No memory loss.   PHYSICAL EXAM: Blood pressure 122/79, pulse 52, temperature 97.9 F (36.6 C), temperature source Oral, resp. rate 16, SpO2 100.00%.  General appearance :Awake, mostly alert , not in any distress. Speech is slightly dysarthric. Not toxic Looking HEENT: Atraumatic and Normocephalic, pupils equally reactive to light and accomodation Neck: supple, no JVD. No cervical lymphadenopathy.  Chest:Good air entry bilaterally, no added sounds  CVS: S1 S2 regular, no murmurs.  Abdomen: Bowel sounds present, Non tender and not distended with no gaurding, rigidity or rebound. Extremities: B/L Lower Ext shows no edema, both legs are warm to touch. The right knee-incision site intact. Mildly swollen. Nontender. Neurology: Awake alert, and oriented X 3, CN II-XII intact, Non  focal- does have mild difficulty in raising his right lower extremity-presumably secondary to recent surgery on his right knee. Skin:No Rash Wounds:N/A  LABS ON ADMISSION:   Recent Labs  07/28/12 1042  NA 139  K 4.0  CL 102  CO2 29  GLUCOSE 119*  BUN 19  CREATININE 0.73  CALCIUM 9.4   No results found for this basename: AST, ALT, ALKPHOS, BILITOT, PROT, ALBUMIN,  in the last 72 hours No results found for this basename: LIPASE, AMYLASE,  in the last 72 hours  Recent Labs  07/28/12 1042  WBC 6.3  NEUTROABS 5.0  HGB 11.2*  HCT 34.8*  MCV 95.3  PLT 478*   No results found for this basename: CKTOTAL, CKMB, CKMBINDEX, TROPONINI,  in the last 72 hours No results found for this basename: DDIMER,  in the last 72  hours No components found with this basename: POCBNP,    RADIOLOGIC STUDIES ON ADMISSION: Ct Head Wo Contrast  07/28/2012  *RADIOLOGY REPORT*  Clinical Data: 79 year old male with slurred speech.  CT HEAD WITHOUT CONTRAST  Technique:  Contiguous axial images were obtained from the base of the skull through the vertex without contrast.  Comparison: 05/11/2011 and prior studies  Findings: Moderate chronic small vessel white matter ischemic changes are noted.  No acute intracranial abnormalities are identified, including mass lesion or mass effect, hydrocephalus, extra-axial fluid collection, midline shift, hemorrhage, or acute infarction.  The visualized bony calvarium is unremarkable.  IMPRESSION: No evidence of acute intracranial abnormality.  Chronic small vessel white matter ischemic changes.   Original Report Authenticated By: Harmon Pier, M.D.     ASSESSMENT AND PLAN: Present on Admission:  . Dilantin toxicity  - Will need to monitor in telemetry, obviously Dilantin will be discontinued  - Check levels in the morning   . Altered mental status - Likely toxic encephalopathy-- In the form of mostly disorientation-likely secondary to Dilantin toxicity - CT head negative  for acute intracranial abnormalities, given slurred speech, atrial fibrillation and is always a concern, however clinical constellation is more consistent with Dilantin toxicity, however for sake of complete workup will order a MRI of the brain, if no acute CVA is seen no further workup will be pursued.  . Seizure disorder - And to have had a long-standing history of seizure disorder-and seems to be maintained on Dilantin-given significant supratherapeutic levels, may need to think of alternative therapy. For now just hold Dilantin till it normalizes.   . Primary osteoarthritis of right knee- status post recent right knee replacement  - Operative site looks clean - Is already on Coumadin for prophylaxis with slightly supratherapeutic INR  . Atrial flutter - Status post ablation - currently in sinus rhythm  - Maintained on digoxin and Coumadin (given history of CVA)  - Monitor on telemetry   . BPH (benign prostatic hyperplasia) - Stable  - Continue Proscar  . HTN (hypertension) - Continue Cardura  Further plan will depend as patient's clinical course evolves and further radiologic and laboratory data become available. Patient will be monitored closely.  DVT Prophylaxis: - Not needed as on Coumadin with slightly supratherapeutic INR  Code Status: - Full code  Total time spent for admission equals 45 minutes.  [facility] Pager [phone]  If 7PM-7AM, please contact night-coverage [email or web address] Password [facility] 07/28/2012, 3:03 PM

## 2012-07-28 NOTE — ED Notes (Signed)
Pat[facility]th[facility] Cell [phone] Home [phone]

## 2012-07-28 NOTE — ED Notes (Addendum)
Per [facility], pt from [facility] for resolved garbled speech. Pt is alert and oriented and states he has gotten worse since he went in there for rehab on his knee. Denies any pain. Hx of stroke in 2012 with deficits to the left side and unsteady balance.

## 2012-07-28 NOTE — Consult Note (Signed)
ANTICOAGULATION CONSULT NOTE - Initial Consult  Pharmacy Consult for Coumadin Indication: hx afib/flutter, hx CVA, s/p R TKA (07/10/12)  No Known Allergies  Vital Signs: Temp: 97.9 F (36.6 C) (02/16 1450) Temp src: Oral (02/16 1450) BP: 121/59 mmHg (02/16 1500) Pulse Rate: 53 (02/16 1500)  Labs:  Recent Labs  07/28/12 1042  HGB 11.2*  HCT 34.8*  PLT 478*  LABPROT 30.7*  INR 3.16*  CREATININE 0.73    The CrCl is unknown because both a height and weight (above a minimum accepted value) are required for this calculation.   Medical History: Past Medical History  Diagnosis Date  . Dysphagia     with solids  . Esophageal stricture   . Diverticulosis   . Osteopenia   . Hemorrhoids   . High cholesterol   . Heart murmur   . H/O hiatal hernia   . Hypertension   . Atrial flutter     typical appearing by ekg. s/p ablation 07/28/11  . Atrial fibrillation     remotely   . Sinus bradycardia     asymptomatic  . Stroke 2012    "left me weaker on left side; balance is not good"  . GERD (gastroesophageal reflux disease)   . Seizures     "back w/migraine headaches; 1990's or before"  . Arthritis     "right hip; right knee; lower back ~ 1/2 way across"  . Anemia    Assessment: 80yom on coumadin pta, admitted with digoxin toxicity. INR on admission is slightly above goal. He has a hx of anemia but CBC appears stable.  Goal of Therapy:  INR 2-3 Monitor platelets by anticoagulation protocol: Yes   Plan:  1) No coumadin tonight 2) Daily INR  Fredrik Rigger 07/28/2012,4:34 PM

## 2012-07-28 NOTE — ED Notes (Signed)
Dr. Pollina at the bedside.  

## 2012-07-28 NOTE — ED Notes (Signed)
Sister at the bedside states he had a UTI that he was being treated for.

## 2012-07-28 NOTE — Progress Notes (Signed)
Pt's HR sustaining in 40's upon arrival on unit from ED; asymptomatic. Alert and conversant. Dr Windell Norfolk paged and notified of pt's rhythm and rate. Also made aware of Pt's cardiology being Dr Alanda Amass, per family and patient, not Dr. Johney Frame as consulting cardiology in [facility]. Dr Jerral Ralph returned called; RN to inform pt and family that [facility] on call contacted and notified of the condition. No new order received. Pt made awarel of call.

## 2012-07-28 NOTE — ED Notes (Signed)
Patient transported to CT 

## 2012-07-28 NOTE — ED Notes (Signed)
Son requesting to speak to Child psychotherapist. Contacted SW on for this weekend and informed her the patient and family did not want him to go back to [facility].

## 2012-07-29 ENCOUNTER — Inpatient Hospital Stay (HOSPITAL_COMMUNITY): Payer: 59

## 2012-07-29 DIAGNOSIS — J309 Allergic rhinitis, unspecified: Secondary | ICD-10-CM | POA: Diagnosis not present

## 2012-07-29 DIAGNOSIS — T420X1A Poisoning by hydantoin derivatives, accidental (unintentional), initial encounter: Secondary | ICD-10-CM | POA: Diagnosis not present

## 2012-07-29 DIAGNOSIS — D62 Acute posthemorrhagic anemia: Secondary | ICD-10-CM | POA: Diagnosis not present

## 2012-07-29 DIAGNOSIS — I4892 Unspecified atrial flutter: Secondary | ICD-10-CM

## 2012-07-29 DIAGNOSIS — R9389 Abnormal findings on diagnostic imaging of other specified body structures: Secondary | ICD-10-CM | POA: Diagnosis not present

## 2012-07-29 LAB — COMPREHENSIVE METABOLIC PANEL
AST: 20 U/L (ref 0–37)
Albumin: 2.6 g/dL — ABNORMAL LOW (ref 3.5–5.2)
Alkaline Phosphatase: 138 U/L — ABNORMAL HIGH (ref 39–117)
BUN: 18 mg/dL (ref 6–23)
CO2: 29 mEq/L (ref 19–32)
Chloride: 103 mEq/L (ref 96–112)
Potassium: 4 mEq/L (ref 3.5–5.1)
Total Bilirubin: 0.3 mg/dL (ref 0.3–1.2)

## 2012-07-29 LAB — CBC
HCT: 30.1 % — ABNORMAL LOW (ref 39.0–52.0)
RBC: 3.21 MIL/uL — ABNORMAL LOW (ref 4.22–5.81)
RDW: 14.8 % (ref 11.5–15.5)
WBC: 6.1 10*3/uL (ref 4.0–10.5)

## 2012-07-29 LAB — PROTIME-INR: Prothrombin Time: 29.7 seconds — ABNORMAL HIGH (ref 11.6–15.2)

## 2012-07-29 LAB — GLUCOSE, CAPILLARY

## 2012-07-29 MED ORDER — SODIUM CHLORIDE 0.9 % IV SOLN
INTRAVENOUS | Status: DC
  Administered 2012-07-29: 11:00:00 via INTRAVENOUS

## 2012-07-29 MED ORDER — GADOBENATE DIMEGLUMINE 529 MG/ML IV SOLN
10.0000 mL | Freq: Once | INTRAVENOUS | Status: AC | PRN
  Administered 2012-07-29: 10 mL via INTRAVENOUS

## 2012-07-29 MED ORDER — WARFARIN - PHARMACIST DOSING INPATIENT: Freq: Every day | Status: DC

## 2012-07-29 NOTE — Evaluation (Signed)
Clinical/Bedside Swallow Evaluation Patient Details  Name: Jay Walters MRN: [id number] Date of Birth: Oct 31, 1931  Today's Date: 07/29/2012 Time: 1151-1203 SLP Time Calculation (min): 12 min  Past Medical History:  Past Medical History  Diagnosis Date  . Dysphagia     with solids  . Esophageal stricture   . Diverticulosis   . Osteopenia   . Hemorrhoids   . High cholesterol   . Heart murmur   . H/O hiatal hernia   . Hypertension   . Atrial flutter     typical appearing by ekg. s/p ablation 07/28/11  . Atrial fibrillation     remotely   . Sinus bradycardia     asymptomatic  . Stroke 2012    "left me weaker on left side; balance is not good"  . GERD (gastroesophageal reflux disease)   . Seizures     "back w/migraine headaches; 1990's or before"  . Arthritis     "right hip; right knee; lower back ~ 1/2 way across"  . Anemia    Past Surgical History:  Past Surgical History  Procedure Laterality Date  . Asd repair  1972    "and repaired my aortic valve, I think"  . Knee arthroscopy      right; "maybe twice"  . Inguinal hernia repair  2011    right  . Cardiac electrophysiology study and ablation  07/28/11  . Total knee arthroplasty  07/10/2012    Procedure: TOTAL KNEE ARTHROPLASTY;  Surgeon: Jacki Cones, MD;  Location: [facility];  Service: Orthopedics;  Laterality: Right;  . Joint replacement      right knee replacement   HPI:  Patient is a 79 year old black male with a past medical history of recent right knee replacement, history of CVA, history of a flutter/A. fib status post ablation and on chronic Coumadin therapy, hypertension, GERD, solid food dysphagia, and esophageal stricture s/p dilation,  who presented from his skilled nursing facility with slurred speech and disorientation. Diagnosed with toxic encephalopathy due to dilantin toxicity.    Assessment / Plan / Recommendation Clinical Impression  Patient presents with a functional appearing oropharyngeal  swallow without overt evidence of aspiration. Patient does confirm a h/o GERD, esophageal stricture, and dilation however patient nor wife report difficulty recently. No further SLP needs indicated at this time. Please reconsult as needed.     Aspiration Risk  Mild    Diet Recommendation Regular;Thin liquid   Liquid Administration via: Cup;Straw Medication Administration: Whole meds with liquid Supervision: Patient able to self feed;Intermittent supervision to cue for compensatory strategies Compensations: Slow rate;Small sips/bites Postural Changes and/or Swallow Maneuvers: Seated upright 90 degrees;Upright 30-60 min after meal    Other  Recommendations Oral Care Recommendations: Oral care BID   Follow Up Recommendations  None       Pertinent Vitals/Pain None reported        Swallow Study Prior Functional Status     General HPI: Patient is a 79 year old black male with a past medical history of recent right knee replacement, history of CVA, history of a flutter/A. fib status post ablation and on chronic Coumadin therapy, hypertension, GERD, solid food dysphagia, and esophageal stricture s/p dilation,  who presented from his skilled nursing facility with slurred speech and disorientation. Diagnosed with toxic encephalopathy due to dilantin toxicity.  Type of Study: Bedside swallow evaluation Previous Swallow Assessment: none Diet Prior to this Study: Regular;Thin liquids Temperature Spikes Noted: No Respiratory Status: Room air History of Recent Intubation: No Behavior/Cognition: Alert;Cooperative;Pleasant mood  Oral Cavity - Dentition: Adequate natural dentition Self-Feeding Abilities: Able to feed self Patient Positioning: Upright in bed Baseline Vocal Quality: Clear Volitional Cough: Strong Volitional Swallow: Able to elicit    Oral/Motor/Sensory Function Overall Oral Motor/Sensory Function: Appears within functional limits for tasks assessed   Ice Chips Ice chips: Not  tested   Thin Liquid Thin Liquid: Within functional limits    Nectar Thick Nectar Thick Liquid: Not tested   Honey Thick Honey Thick Liquid: Not tested   Puree Puree: Within functional limits   Solid   GO   Jay Cotten MA, CCC-SLP [phone]  Solid: Within functional limits       Jay Walters Meryl 07/29/2012,1:31 PM

## 2012-07-29 NOTE — Evaluation (Signed)
Physical Therapy Evaluation Patient Details Name: Jay Walters MRN: [id number] DOB: 11-30-1931 Today's Date: 07/29/2012 Time: 1308-6578 PT Time Calculation (min): 36 min  PT Assessment / Plan / Recommendation Clinical Impression  Pt admitted for dilantin toxicity but had a R tKA 2 weeks ago and was at [facility]. Pt now unable to maintain sitting balance, has delayed response, and impaired co-ordination in addition to decreased strength and ROM in R LE from TKA. Pt to remain to benefit from SNF placement however wife desires alternative placement.    PT Assessment  Patient needs continued PT services    Follow Up Recommendations  SNF    Does the patient have the potential to tolerate intense rehabilitation      Barriers to Discharge None      Equipment Recommendations  None recommended by PT    Recommendations for Other Services OT consult   Frequency Min 5X/week    Precautions / Restrictions Precautions Precautions: Knee;Fall Restrictions Weight Bearing Restrictions: No   Pertinent Vitals/Pain Reports R knee pain but unable to rate      Mobility  Bed Mobility Bed Mobility: Supine to Sit;Sit to Supine Supine to Sit: 2: Max assist;With rails;HOB elevated Sit to Supine: 2: Max assist;With rail;HOB flat Details for Bed Mobility Assistance: maxA for LE managment both in/out of bed Transfers Transfers: Not assessed (pt unable to maintain sitting balance)    Exercises Total Joint Exercises Gluteal Sets: AROM;Right;10 reps;Seated Heel Slides: AAROM;Right;10 reps;Supine   PT Diagnosis:    PT Problem List: Decreased strength;Decreased activity tolerance;Decreased balance;Decreased mobility;Decreased coordination;Decreased cognition PT Treatment Interventions: DME instruction;Gait training;Stair training;Functional mobility training;Therapeutic activities;Therapeutic exercise;Patient/family education   PT Goals Acute Rehab PT Goals PT Goal Formulation: With  patient/family Time For Goal Achievement: 08/12/12 Potential to Achieve Goals: Fair Pt will go Supine/Side to Sit: with supervision;with HOB 0 degrees PT Goal: Supine/Side to Sit - Progress: Goal set today Pt will go Sit to Supine/Side: with min assist;with HOB 0 degrees PT Goal: Sit to Supine/Side - Progress: Goal set today Pt will go Sit to Stand: with mod assist;with upper extremity assist (up to RW) PT Goal: Sit to Stand - Progress: Goal set today Pt will Ambulate: 16 - 50 feet;with mod assist;with rolling walker  Visit Information  Last PT Received On: 07/29/12 Assistance Needed: +2    Subjective Data  Subjective: Pt received supine in bed with lethargy but agreeable to PT.   Prior Functioning  Home Living Lives With:  (was at [facility] for R TKA rehab, previously home with spouse and was independent Additional Comments: pt's spouse desires different SNF placement Prior Function Level of Independence: Needs assistance Comments: pt reports "i just started to be able to stand and walk on it." Communication Communication:  (increased response time) Dominant Hand: Right    Cognition  Cognition Overall Cognitive Status: Impaired Area of Impairment: Following commands;Awareness of deficits;Problem solving;Safety/judgement Arousal/Alertness: Lethargic Orientation Level: Disoriented to;Situation Behavior During Session: Lethargic Following Commands: Follows one step commands inconsistently Safety/Judgement: Decreased awareness of safety precautions;Decreased awareness of need for assistance Awareness of Deficits: pt reports he is aware he is falling to Right and forward but doesn't attempt to regain balance Problem Solving: pt unable to fix hand placement to stabilize self in sitting    Extremity/Trunk Assessment Right Upper Extremity Assessment RUE ROM/Strength/Tone: WFL for tasks assessed RUE Sensation: WFL - Light Touch RUE Coordination:  (delayed response time when  using UE) Left Upper Extremity Assessment LUE ROM/Strength/Tone: [facility] for tasks  assessed LUE Sensation: WFL - Light Touch LUE Coordination:  (delayed response time when using UE functionally) Right Lower Extremity Assessment RLE ROM/Strength/Tone: Deficits RLE ROM/Strength/Tone Deficits: 2/5 quads with AAROM with quad set, AROM -5-25 deg, 70 Passive R knee flex RLE Sensation: WFL - Light Touch Left Lower Extremity Assessment LLE ROM/Strength/Tone: WFL for tasks assessed LLE Sensation: WFL - Light Touch Trunk Assessment Trunk Assessment: Normal   Balance Balance Balance Assessed: Yes Static Sitting Balance Static Sitting - Balance Support: Bilateral upper extremity supported Static Sitting - Level of Assistance: 4: Min assist;1: +1 Total assist Static Sitting - Comment/# of Minutes: pt with postive R lateral and forward lean. pt swaying back an forth, with max directional cues and concentration pt able to maintain balance for 5 seconds otherwise pt unable to maintain hand placement to assist pt in maintaining balance. Worked on balance x 8 min  End of Session PT - End of Session Activity Tolerance: Patient limited by fatigue Patient left: in bed;with call bell/phone within reach;with family/visitor present Nurse Communication: Mobility status  GP     Marcene Brawn 07/29/2012, 12:10 PM  Lewis Shock, PT, DPT Pager #: [phone] Office #: [phone]

## 2012-07-29 NOTE — Progress Notes (Signed)
Pharmacy monitoring CONSULT NOTE  Pharmacy Consult for warfarin Indication: hx afib/flutter, hx CVA, s/p R TKA (07/10/12)  Pharmacy Consult:phenytoin Indication:seizures  No Known Allergies  Patient Measurements: Weight: 119 lb 4.3 oz (54.1 kg)   Vital Signs: Temp: 98.6 F (37 C) (02/17 0606) Temp src: Oral (02/17 0606) BP: 130/57 mmHg (02/17 0606) Pulse Rate: 59 (02/17 0606)  Labs:  Recent Labs  07/28/12 1042 07/29/12 0507  HGB 11.2* 9.8*  HCT 34.8* 30.1*  PLT 478* 448*  LABPROT 30.7* 29.7*  INR 3.16* 3.02*  CREATININE 0.73 0.64    The CrCl is unknown because both a height and weight (above a minimum accepted value) are required for this calculation.   Medical History: Past Medical History  Diagnosis Date  . Dysphagia     with solids  . Esophageal stricture   . Diverticulosis   . Osteopenia   . Hemorrhoids   . High cholesterol   . Heart murmur   . H/O hiatal hernia   . Hypertension   . Atrial flutter     typical appearing by ekg. s/p ablation 07/28/11  . Atrial fibrillation     remotely   . Sinus bradycardia     asymptomatic  . Stroke 2012    "left me weaker on left side; balance is not good"  . GERD (gastroesophageal reflux disease)   . Seizures     "back w/migraine headaches; 1990's or before"  . Arthritis     "right hip; right knee; lower back ~ 1/2 way across"  . Anemia     Medications:  Warfarin 5mg  daily except 7.5 mg on Sunday and Fridays  Phenytoin 300mg  qhs  Assessment: 79 year old male with past medical history of recent right knee replacement, history of CVA, history of a flutter/A. fib status post ablation and on chronic Coumadin therapy. INR was slightly above goal on admit at 3.1 and has drifted down slightly today to 3.0. Patient is also on phenytoin for seizure disorder. His phenytoin level was greatly above goal at 41.7 yesterday and has drifted down to 31 this morning.  Renal function appears normal, however albumin is low at  2.6 which likely somewhat exacerbated patient's phenytoin toxicity. Phenytoin level corrected this am is still ~51. Considering how protein bound phenytoin is I would have expected patient's digoxin and INR level to be higher.   Will continue to hold phenytoin and warfarin, INR should drop to within therapeutic range in am and warfarin could be restarted. Plan to continue to monitor phenytoin with level in am. Would consider nutrition consult although no noted nausea and vomiting which is usually present in phenytoin toxicity.  Goal of Therapy:  INR 2-3 Monitor platelets by anticoagulation protocol: Yes Phenytoin level 10-20   Plan:  Hold warfarin 1 more day-INR daily Continue to hold phenytoin - recheck level in am Consider nutrition consult for possible malnutrion based on low albumin.  Severiano Gilbert 07/29/2012,8:36 AM

## 2012-07-29 NOTE — Clinical Social Work Note (Addendum)
Clinical Social Work   CSW completed [employer] and sent referral for SNF to [facility]. CSW will follow up with bed offers. CSW will continue to follow.   Dede Query, MSW, Theresia Majors [phone]  Addendum: CSW met with pt and son to update them on discharge planning process. Pt's son requested that this CSW contact pt's wife at work. CSW spoke with Mrs. States. CSW will provide bed offers when they are available. CSW will continue to follow.   Dede Query, MSW, Theresia Majors [phone]

## 2012-07-29 NOTE — Consult Note (Signed)
Reason for Consult:right knee swelling s/p total knee arthroplasty  Referring Physician: Dr. Myer Haff Lindholm is an 79 y.o. male.  HPI: The patient is a 79 year old male who presented from [facility] last night with confusion and slurred speech. He was admitted by medicine to be evaluated for dilantin toxicity. Patient is two weeks out from a right total knee. Right total knee upon admission was swollen and painful. Medicine was concerned about infection and/or DVT. Patient denies shortness of breath or chest pain. He has not experienced calf pain. He reports pain in the right knee with activity. He has been an active participate in PT. He has not has any drainage from the incision or fracture blisters that were treated in Dr. Jeannetta Ellis office last week. His sister reports that he was treated for a UTI while in SNF this past week.   Past Medical History  Diagnosis Date  . Dysphagia     with solids  . Esophageal stricture   . Diverticulosis   . Osteopenia   . Hemorrhoids   . High cholesterol   . Heart murmur   . H/O hiatal hernia   . Hypertension   . Atrial flutter     typical appearing by ekg. s/p ablation 07/28/11  . Atrial fibrillation     remotely   . Sinus bradycardia     asymptomatic  . Stroke 2012    "left me weaker on left side; balance is not good"  . GERD (gastroesophageal reflux disease)   . Seizures     "back w/migraine headaches; 1990's or before"  . Arthritis     "right hip; right knee; lower back ~ 1/2 way across"  . Anemia     Past Surgical History  Procedure Laterality Date  . Asd repair  1972    "and repaired my aortic valve, I think"  . Knee arthroscopy      right; "maybe twice"  . Inguinal hernia repair  2011    right  . Cardiac electrophysiology study and ablation  07/28/11  . Total knee arthroplasty  07/10/2012    Procedure: TOTAL KNEE ARTHROPLASTY;  Surgeon: Jacki Cones, MD;  Location: [facility]S;  Service: Orthopedics;  Laterality: Right;  .  Joint replacement      right knee replacement    Family History  Problem Relation Age of Onset  . Heart disease Father   . Cancer Sister     unsure what type    Social History:  reports that he has never smoked. He has never used smokeless tobacco. He reports that  drinks alcohol. He reports that he does not use illicit drugs.  Allergies: No Known Allergies  Medications: I have reviewed the patient's current medications.  Results for orders placed during the hospital encounter of 07/28/12 (from the past 48 hour(s))  CBC WITH DIFFERENTIAL     Status: Abnormal   Collection Time    07/28/12 10:42 AM      Result Value Range   WBC 6.3  4.0 - 10.5 K/uL   RBC 3.65 (*) 4.22 - 5.81 MIL/uL   Hemoglobin 11.2 (*) 13.0 - 17.0 g/dL   HCT 16.1 (*) 09.6 - 04.5 %   MCV 95.3  78.0 - 100.0 fL   MCH 30.7  26.0 - 34.0 pg   MCHC 32.2  30.0 - 36.0 g/dL   RDW 40.9  81.1 - 91.4 %   Platelets 478 (*) 150 - 400 K/uL   Neutrophils Relative  79 (*) 43 - 77 %   Neutro Abs 5.0  1.7 - 7.7 K/uL   Lymphocytes Relative 12  12 - 46 %   Lymphs Abs 0.8  0.7 - 4.0 K/uL   Monocytes Relative 8  3 - 12 %   Monocytes Absolute 0.5  0.1 - 1.0 K/uL   Eosinophils Relative 0  0 - 5 %   Eosinophils Absolute 0.0  0.0 - 0.7 K/uL   Basophils Relative 1  0 - 1 %   Basophils Absolute 0.1  0.0 - 0.1 K/uL  BASIC METABOLIC PANEL     Status: Abnormal   Collection Time    07/28/12 10:42 AM      Result Value Range   Sodium 139  135 - 145 mEq/L   Potassium 4.0  3.5 - 5.1 mEq/L   Chloride 102  96 - 112 mEq/L   CO2 29  19 - 32 mEq/L   Glucose, Bld 119 (*) 70 - 99 mg/dL   BUN 19  6 - 23 mg/dL   Creatinine, Ser 9.60  0.50 - 1.35 mg/dL   Calcium 9.4  8.4 - 45.4 mg/dL   GFR calc non Af Amer 87 (*) >90 mL/min   GFR calc Af Amer >90  >90 mL/min   Comment:            The eGFR has been calculated     using the CKD EPI equation.     This calculation has not been     validated in all clinical     situations.     eGFR's  persistently     <90 mL/min signify     possible Chronic Kidney Disease.  PROTIME-INR     Status: Abnormal   Collection Time    07/28/12 10:42 AM      Result Value Range   Prothrombin Time 30.7 (*) 11.6 - 15.2 seconds   INR 3.16 (*) 0.00 - 1.49  DIGOXIN LEVEL     Status: Abnormal   Collection Time    07/28/12 10:52 AM      Result Value Range   Digoxin Level <0.3 (*) 0.8 - 2.0 ng/mL  PHENYTOIN LEVEL, TOTAL     Status: Abnormal   Collection Time    07/28/12 10:52 AM      Result Value Range   Phenytoin Lvl 41.7 (*) 10.0 - 20.0 ug/mL   Comment: RESULTS CONFIRMED BY MANUAL DILUTION     CRITICAL RESULT CALLED TO, READ BACK BY AND VERIFIED WITH:     S.NEWSOME,RN 1505 07/28/12 EHOWARD  URINALYSIS, ROUTINE W REFLEX MICROSCOPIC     Status: Abnormal   Collection Time    07/28/12 12:39 PM      Result Value Range   Color, Urine ORANGE (*) YELLOW   Comment: BIOCHEMICALS MAY BE AFFECTED BY COLOR   APPearance CLOUDY (*) CLEAR   Specific Gravity, Urine 1.022  1.005 - 1.030   pH 6.5  5.0 - 8.0   Glucose, UA NEGATIVE  NEGATIVE mg/dL   Hgb urine dipstick NEGATIVE  NEGATIVE   Bilirubin Urine SMALL (*) NEGATIVE   Ketones, ur 15 (*) NEGATIVE mg/dL   Protein, ur NEGATIVE  NEGATIVE mg/dL   Urobilinogen, UA 2.0 (*) 0.0 - 1.0 mg/dL   Nitrite POSITIVE (*) NEGATIVE   Leukocytes, UA SMALL (*) NEGATIVE  URINE MICROSCOPIC-ADD ON     Status: Abnormal   Collection Time    07/28/12 12:39 PM      Result Value Range  Squamous Epithelial / LPF RARE  RARE   WBC, UA 11-20  <3 WBC/hpf   RBC / HPF 0-2  <3 RBC/hpf   Bacteria, UA FEW (*) RARE  CBC     Status: Abnormal   Collection Time    07/29/12  5:07 AM      Result Value Range   WBC 6.1  4.0 - 10.5 K/uL   RBC 3.21 (*) 4.22 - 5.81 MIL/uL   Hemoglobin 9.8 (*) 13.0 - 17.0 g/dL   HCT 16.1 (*) 09.6 - 04.5 %   MCV 93.8  78.0 - 100.0 fL   MCH 30.5  26.0 - 34.0 pg   MCHC 32.6  30.0 - 36.0 g/dL   RDW 40.9  81.1 - 91.4 %   Platelets 448 (*) 150 - 400 K/uL   COMPREHENSIVE METABOLIC PANEL     Status: Abnormal   Collection Time    07/29/12  5:07 AM      Result Value Range   Sodium 139  135 - 145 mEq/L   Potassium 4.0  3.5 - 5.1 mEq/L   Chloride 103  96 - 112 mEq/L   CO2 29  19 - 32 mEq/L   Glucose, Bld 101 (*) 70 - 99 mg/dL   BUN 18  6 - 23 mg/dL   Creatinine, Ser 7.82  0.50 - 1.35 mg/dL   Calcium 8.8  8.4 - 95.6 mg/dL   Total Protein 6.8  6.0 - 8.3 g/dL   Albumin 2.6 (*) 3.5 - 5.2 g/dL   AST 20  0 - 37 U/L   ALT 22  0 - 53 U/L   Alkaline Phosphatase 138 (*) 39 - 117 U/L   Total Bilirubin 0.3  0.3 - 1.2 mg/dL   GFR calc non Af Amer >90  >90 mL/min   GFR calc Af Amer >90  >90 mL/min   Comment:            The eGFR has been calculated     using the CKD EPI equation.     This calculation has not been     validated in all clinical     situations.     eGFR's persistently     <90 mL/min signify     possible Chronic Kidney Disease.  PROTIME-INR     Status: Abnormal   Collection Time    07/29/12  5:07 AM      Result Value Range   Prothrombin Time 29.7 (*) 11.6 - 15.2 seconds   INR 3.02 (*) 0.00 - 1.49  PHENYTOIN LEVEL, TOTAL     Status: Abnormal   Collection Time    07/29/12  5:07 AM      Result Value Range   Phenytoin Lvl 31.9 (*) 10.0 - 20.0 ug/mL   Comment: CRITICAL RESULT CALLED TO, READ BACK BY AND VERIFIED WITH:     MATHERS,C RN 07/29/2012 0614 JORDANS  GLUCOSE, CAPILLARY     Status: Abnormal   Collection Time    07/29/12  6:42 AM      Result Value Range   Glucose-Capillary 103 (*) 70 - 99 mg/dL   Comment 1 Documented in Chart     Comment 2 Notify RN      Ct Head Wo Contrast  07/28/2012  *RADIOLOGY REPORT*  Clinical Data: 79 year old male with slurred speech.  CT HEAD WITHOUT CONTRAST  Technique:  Contiguous axial images were obtained from the base of the skull through the vertex without contrast.  Comparison: 05/11/2011 and  prior studies  Findings: Moderate chronic small vessel white matter ischemic changes are noted.   No acute intracranial abnormalities are identified, including mass lesion or mass effect, hydrocephalus, extra-axial fluid collection, midline shift, hemorrhage, or acute infarction.  The visualized bony calvarium is unremarkable.  IMPRESSION: No evidence of acute intracranial abnormality.  Chronic small vessel white matter ischemic changes.   Original Report Authenticated By: Harmon Pier, M.D.    Mr Laqueta Jean Wo Contrast  07/29/2012  *RADIOLOGY REPORT*  Clinical Data: Altered mental status following orthopedic surgery. History of stroke.  MRI HEAD WITHOUT AND WITH CONTRAST  Technique:  Multiplanar, multiecho pulse sequences of the brain and surrounding structures were obtained according to standard protocol without and with intravenous contrast  Contrast: 10mL MULTIHANCE GADOBENATE DIMEGLUMINE 529 MG/ML IV SOLN  Comparison: Head CT 07/28/2012.  MRI 04/26/2011.  Findings: Diffusion imaging does not show any acute or subacute infarction.  The brainstem and cerebellum are unremarkable.  The cerebral hemispheres show moderate chronic appearing small vessel changes throughout the hemispheric white matter, slightly advanced since 2012.  No cortical or large vessel territory infarction.  No evidence of obstructive hydrocephalus, hemorrhage, or extra-axial collection.  After contrast administration, no abnormal enhancement occurs.  No pituitary mass.  No inflammatory sinus disease.  No skull or skull base lesion.  IMPRESSION: Chronic small vessel disease throughout the cerebral hemispheric white matter, somewhat progressive since 2012.  No evidence of acute or subacute infarction or other reversible process.   Original Report Authenticated By: Paulina Fusi, M.D.    Dg Knee Right Port  07/29/2012  *RADIOLOGY REPORT*  Clinical Data: 79 year old male status post knee arthroplasty. Swelling and bruising.  Question hematoma.  PORTABLE RIGHT KNEE - 1-2 VIEW  Comparison: 07/10/2012 and postoperative films.  Findings: AP  portable cross-table lateral views.  Sequelae of total knee arthroplasty.  Hardware components appear stable and intact. Postoperative drain has been removed.  Postoperative gas has resolved.  There is increase soft tissue density diffusely about the knee, more in the suprapatellar region.  No fracture identified.  IMPRESSION: 1.  Increased soft tissue density about the knee especially the suprapatellar region.  Appearance favors soft tissue hematoma plus/minus moderate joint effusion. 2.  Total knee arthroplasty components appear intact with stable alignment.   Original Report Authenticated By: Erskine Speed, M.D.     Review of Systems  Constitutional: Positive for malaise/fatigue. Negative for fever, chills, weight loss and diaphoresis.  HENT: Negative.  Negative for neck pain.   Eyes: Negative.   Respiratory: Negative.   Cardiovascular: Negative.   Gastrointestinal: Negative.   Genitourinary: Negative.   Musculoskeletal: Positive for joint pain. Negative for myalgias, back pain and falls.       Right knee pain (2 weeks PO right TKA)  Skin: Negative.   Neurological: Positive for speech change and weakness. Negative for dizziness, tingling, tremors, sensory change, focal weakness, seizures and loss of consciousness.  Endo/Heme/Allergies: Negative.   Psychiatric/Behavioral: Positive for memory loss. Negative for depression, suicidal ideas and substance abuse. The patient is not nervous/anxious and does not have insomnia.    Blood pressure 117/58, pulse 58, temperature 98 F (36.7 C), temperature source Oral, resp. rate 20, weight 54.1 kg (119 lb 4.3 oz), SpO2 100.00%. Physical Exam  Constitutional: He is oriented to person, place, and time. He appears well-developed and well-nourished. No distress.  HENT:  Head: Normocephalic and atraumatic.  Right Ear: External ear normal.  Left Ear: External ear normal.  Nose: Nose normal.  Mouth/Throat: Oropharynx is clear and moist.  Eyes: Conjunctivae  and EOM are normal.  Neck: Normal range of motion. Neck supple. No tracheal deviation present. No thyromegaly present.  Cardiovascular: Regular rhythm, normal heart sounds and intact distal pulses.  Bradycardia present.   No murmur heard. Respiratory: Effort normal and breath sounds normal. No respiratory distress. He has no wheezes. He exhibits no tenderness.  GI: Soft. Bowel sounds are normal. He exhibits no distension and no mass. There is no tenderness.  Musculoskeletal:       Right hip: Normal.       Left hip: Normal.       Right knee: He exhibits decreased range of motion, swelling and ecchymosis. He exhibits no erythema. Tenderness found. Medial joint line and lateral joint line tenderness noted.       Left knee: Normal.       Right ankle: Normal.       Left ankle: Normal.       Right lower leg: He exhibits no tenderness and no swelling.       Left lower leg: He exhibits no tenderness and no swelling.  Right knee exhibits ROM 5-85 passively with minimal discomfort until 80 degrees. Incision healing well. No drainage. Moderate sized hematoma present in joint. No erythema. Minimal warmth present and due to swelling. No current signs of infection. Fracture blister at the medial distal portion of the incision healing nicely with no drainage.   Lymphadenopathy:    He has no cervical adenopathy.  Neurological: He is alert and oriented to person, place, and time. He has normal reflexes. No sensory deficit.  Skin: No rash noted. He is not diaphoretic. No erythema.  Psychiatric: He has a normal mood and affect.    Assessment/Plan: S/P right total knee arthroplasty Hematoma, right knee [name] is 2 weeks post op from a right total knee arthroplasty. He has been at [facility]. He is currently progressing slowly due to decreased strength. Confusion seems to have resolved today. There are no signs of infection in the knee. He has a hematoma present which will dissipate with time. His passive  ROM is actually quite good at this point considering. Patient needs to be placed back in SNF upon discharge as he is unable to care for himself. We will plan to see him in the office in 2 weeks. If the patient develops erythema, increased swelling, or fever we will need to reevaluate the knee for infection. Patient can participate in PT if ok with medical service.   Ji Fairburn LAUREN 07/29/2012, 1:53 PM

## 2012-07-29 NOTE — Progress Notes (Signed)
Dilantin level 31.9. Midlevel practitioner TC is aware. Patient remains confused with poor safety awareness. Last dilantin level 41.7 on 07/28/12

## 2012-07-29 NOTE — Progress Notes (Signed)
[facility]                                                                                Patient Demographics  Jay Walters, is a 79 y.o. male, DOB - October 02, 1931, ZOX:[id number], WJX:[id number]  Admit date - 07/28/2012  Admitting Physician Ripudeep Jenna Luo, MD  Outpatient Primary MD for the patient is [name], MD  LOS - 1   Chief Complaint  Patient presents with  . resolved garbled speech         Assessment & Plan    1. Mild confusion, slurred speech due to comminution of delirium, Dilantin toxicity and UTI -   CT of the brain unremarkable, Dilantin held and being monitored by pharmacy, levels are coming down, will be monitored closely on a daily basis, Will check MRI of the brain to rule out CVA, stable on telemetry,  Rocephin for UTI.   I suspect there is baseline early memory loss with mild dementia also contradicting to his problems.    Will have PT and speech monitor him closely.       2. History of Seizure disorder  - And to have had a long-standing history of seizure disorder-and seems to be maintained on Dilantin-given significant supratherapeutic levels, may need to think of alternative therapy. For now just hold Dilantin till it normalizes, pharmacy monitoring.     3. . Primary osteoarthritis of right knee- status post recent right knee replacement  - Operative site  has large hematoma confirmed by x-ray, have requested Dr. Juliene Pina to evaluate the patient in the hospital.     4.. Atrial flutter  - Status post ablation - currently in sinus rhythm  - Maintained on digoxin and Coumadin (given history of CVA)  Monitor on telemetry, INR being monitored by pharmacy .      5. BPH (benign prostatic hyperplasia)  - Stable  - Continue Proscar     6. HTN (hypertension)  - Continue Cardura  Further plan will depend as patient's clinical course evolves and further radiologic and laboratory data become available. Patient will be  monitored closely.      DVT Prophylaxis:  Not needed as on Coumadin.  Lab Results  Component Value Date   INR 3.02* 07/29/2012   INR 3.16* 07/28/2012   INR 1.72* 07/15/2012     Code Status: Full  Family Communication: Discussed with patient and his son over the phone in detail  Disposition Plan: SNF   Procedures CT brain, MRI brain   Consults  Orthopedics   DVT Prophylaxis  Coumadin per pharmacy  Lab Results  Component Value Date   INR 3.02* 07/29/2012   INR 3.16* 07/28/2012   INR 1.72* 07/15/2012     Lab Results  Component Value Date   PLT 448* 07/29/2012    Medications  Scheduled Meds: . aspirin EC  81 mg Oral Daily  . cefTRIAXone (ROCEPHIN)  IV  1 g Intravenous Q24H  . digoxin  0.0625 mg Oral QODAY  . doxazosin  4 mg Oral QPM  . ferrous sulfate  325 mg Oral BID PC  . finasteride  5 mg Oral QPM  . polyethylene glycol  17 g  Oral Daily  . sodium chloride  3 mL Intravenous Q12H  . Warfarin - Pharmacist Dosing Inpatient   Does not apply q1800   Continuous Infusions: . sodium chloride     PRN Meds:.acetaminophen, albuterol, guaiFENesin-dextromethorphan, HYDROcodone-acetaminophen, methocarbamol, ondansetron (ZOFRAN) IV, ondansetron  Antibiotics    Anti-infectives   Start     Dose/Rate Route Frequency Ordered Stop   07/28/12 1700  cefTRIAXone (ROCEPHIN) 1 g in dextrose 5 % 50 mL IVPB     1 g 100 mL/hr over 30 Minutes Intravenous Every 24 hours 07/28/12 1631     07/28/12 1415  cefTRIAXone (ROCEPHIN) 1 g in dextrose 5 % 50 mL IVPB     1 g 100 mL/hr over 30 Minutes Intravenous  Once 07/28/12 1414 07/28/12 1510       Time Spent in minutes   35   SINGH,PRASHANT K M.D on 07/29/2012 at 10:36 AM  Between 7am to 7pm - Pager - [phone]  After 7pm go to [email or web address] - password [email or web address]  And look for the night coverage person covering for me after hours  [facility] Office  [phone]    Subjective:   [address] today has, No  headache, No chest pain, No abdominal pain - No Nausea, No new weakness tingling or numbness, No Cough - SOB.    Objective:   Filed Vitals:   07/28/12 1605 07/28/12 2212 07/29/12 0606 07/29/12 1008  BP: 131/60 118/51 130/57 117/58  Pulse: 50 52 59 58  Temp: 97.9 F (36.6 C) 98 F (36.7 C) 98.6 F (37 C) 98 F (36.7 C)  TempSrc: Oral Oral Oral Oral  Resp: 16 20 20 20   Weight:   54.1 kg (119 lb 4.3 oz)   SpO2: 100% 100% 100% 100%    Wt Readings from Last 3 Encounters:  07/29/12 54.1 kg (119 lb 4.3 oz)  07/11/12 58.4 kg (128 lb 12 oz)  07/11/12 58.4 kg (128 lb 12 oz)     Intake/Output Summary (Last 24 hours) at 07/29/12 1036 Last data filed at 07/29/12 0700  Gross per 24 hour  Intake    350 ml  Output    400 ml  Net    -50 ml    Exam Awake , mildly confused, Oriented X 2, No new F.N deficits, Normal affect Marine.AT,PERRAL Supple Neck,No JVD, No cervical lymphadenopathy appriciated.  Symmetrical Chest wall movement, Good air movement bilaterally, CTAB RRR,No Gallops,Rubs or new Murmurs, No Parasternal Heave +ve B.Sounds, Abd Soft, Non tender, No organomegaly appriciated, No rebound - guarding or rigidity. No Cyanosis, Clubbing or edema, No new Rash or bruise , right knee swollen with old moderate sized hematoma   Data Review   Micro Results No results found for this or any previous visit (from the past 240 hour(s)).  Radiology Reports Dg Chest 2 View  07/04/2012  *RADIOLOGY REPORT*  Clinical Data: Preop respiratory exam for knee surgery. Hypertension.  CHEST - 2 VIEW  Comparison: 02/18/2010  Findings: Mild cardiomegaly and ectasia of the thoracic aorta are stable.  Pulmonary hyperinflation is noted.  Both lungs are clear. No evidence of pleural effusion.  No mass or lymphadenopathy identified.  Mild thoracic dextroscoliosis is stable.  IMPRESSION: Stable cardiomegaly and probable COPD.  No active disease.   Original Report Authenticated By: Myles Rosenthal, M.D.    Ct Head Wo  Contrast  07/28/2012  *RADIOLOGY REPORT*  Clinical Data: 79 year old male with slurred speech.  CT HEAD WITHOUT CONTRAST  Technique:  Contiguous axial images were  obtained from the base of the skull through the vertex without contrast.  Comparison: 05/11/2011 and prior studies  Findings: Moderate chronic small vessel white matter ischemic changes are noted.  No acute intracranial abnormalities are identified, including mass lesion or mass effect, hydrocephalus, extra-axial fluid collection, midline shift, hemorrhage, or acute infarction.  The visualized bony calvarium is unremarkable.  IMPRESSION: No evidence of acute intracranial abnormality.  Chronic small vessel white matter ischemic changes.   Original Report Authenticated By: Harmon Pier, M.D.    Dg Knee Right Port  07/29/2012  *RADIOLOGY REPORT*  Clinical Data: 79 year old male status post knee arthroplasty. Swelling and bruising.  Question hematoma.  PORTABLE RIGHT KNEE - 1-2 VIEW  Comparison: 07/10/2012 and postoperative films.  Findings: AP portable cross-table lateral views.  Sequelae of total knee arthroplasty.  Hardware components appear stable and intact. Postoperative drain has been removed.  Postoperative gas has resolved.  There is increase soft tissue density diffusely about the knee, more in the suprapatellar region.  No fracture identified.  IMPRESSION: 1.  Increased soft tissue density about the knee especially the suprapatellar region.  Appearance favors soft tissue hematoma plus/minus moderate joint effusion. 2.  Total knee arthroplasty components appear intact with stable alignment.   Original Report Authenticated By: Erskine Speed, M.D.    Dg Knee Right Port  07/10/2012  *RADIOLOGY REPORT*  Clinical Data: Right total knee arthroplasty.  PORTABLE RIGHT KNEE - 1-2 VIEW  Comparison: None  Findings: The tibial and femoral components appear well seated.  No complicating features are demonstrated.  IMPRESSION: Well seated components of a total  right knee arthroplasty.   Original Report Authenticated By: Rudie Meyer, M.D.     CBC  Recent Labs Lab 07/28/12 1042 07/29/12 0507  WBC 6.3 6.1  HGB 11.2* 9.8*  HCT 34.8* 30.1*  PLT 478* 448*  MCV 95.3 93.8  MCH 30.7 30.5  MCHC 32.2 32.6  RDW 15.1 14.8  LYMPHSABS 0.8  --   MONOABS 0.5  --   EOSABS 0.0  --   BASOSABS 0.1  --     Chemistries   Recent Labs Lab 07/28/12 1042 07/29/12 0507  NA 139 139  K 4.0 4.0  CL 102 103  CO2 29 29  GLUCOSE 119* 101*  BUN 19 18  CREATININE 0.73 0.64  CALCIUM 9.4 8.8  AST  --  20  ALT  --  22  ALKPHOS  --  138*  BILITOT  --  0.3   ------------------------------------------------------------------------------------------------------------------ CrCl is unknown because both a height and weight (above a minimum accepted value) are required for this calculation. ------------------------------------------------------------------------------------------------------------------ No results found for this basename: HGBA1C,  in the last 72 hours ------------------------------------------------------------------------------------------------------------------ No results found for this basename: CHOL, HDL, LDLCALC, TRIG, CHOLHDL, LDLDIRECT,  in the last 72 hours ------------------------------------------------------------------------------------------------------------------ No results found for this basename: TSH, T4TOTAL, FREET3, T3FREE, THYROIDAB,  in the last 72 hours ------------------------------------------------------------------------------------------------------------------ No results found for this basename: VITAMINB12, FOLATE, FERRITIN, TIBC, IRON, RETICCTPCT,  in the last 72 hours  Coagulation profile  Recent Labs Lab 07/28/12 1042 07/29/12 0507  INR 3.16* 3.02*    No results found for this basename: DDIMER,  in the last 72 hours  Cardiac Enzymes No results found for this basename: CK, CKMB, TROPONINI, MYOGLOBIN,  in  the last 168 hours ------------------------------------------------------------------------------------------------------------------ No components found with this basename: POCBNP,

## 2012-07-30 DIAGNOSIS — J3089 Other allergic rhinitis: Secondary | ICD-10-CM

## 2012-07-30 DIAGNOSIS — D62 Acute posthemorrhagic anemia: Secondary | ICD-10-CM | POA: Diagnosis not present

## 2012-07-30 DIAGNOSIS — R9389 Abnormal findings on diagnostic imaging of other specified body structures: Secondary | ICD-10-CM | POA: Diagnosis not present

## 2012-07-30 DIAGNOSIS — T420X1A Poisoning by hydantoin derivatives, accidental (unintentional), initial encounter: Secondary | ICD-10-CM | POA: Diagnosis not present

## 2012-07-30 LAB — URINE CULTURE: Colony Count: >=100000

## 2012-07-30 LAB — COMPREHENSIVE METABOLIC PANEL
BUN: 15 mg/dL (ref 6–23)
CO2: 27 mEq/L (ref 19–32)
Calcium: 8.7 mg/dL (ref 8.4–10.5)
Creatinine, Ser: 0.65 mg/dL (ref 0.50–1.35)
GFR calc Af Amer: >90 mL/min (ref >90)
GFR calc non Af Amer: >90 mL/min (ref >90)
Glucose, Bld: 95 mg/dL (ref 70–99)

## 2012-07-30 LAB — GLUCOSE, CAPILLARY: Glucose-Capillary: 90 mg/dL (ref 70–99)

## 2012-07-30 LAB — PROTIME-INR: INR: 2.42 — ABNORMAL HIGH (ref 0.00–1.49)

## 2012-07-30 MED ORDER — LORAZEPAM 2 MG/ML IJ SOLN
INTRAMUSCULAR | Status: AC
  Filled 2012-07-30: qty 1

## 2012-07-30 MED ORDER — LORAZEPAM 2 MG/ML IJ SOLN
1.0000 mg | Freq: Once | INTRAMUSCULAR | Status: AC
  Administered 2012-07-30: 1 mg via INTRAVENOUS

## 2012-07-30 MED ORDER — WARFARIN SODIUM 5 MG PO TABS
5.0000 mg | ORAL_TABLET | Freq: Once | ORAL | Status: AC
  Administered 2012-07-30: 5 mg via ORAL
  Filled 2012-07-30: qty 1

## 2012-07-30 MED ORDER — CIPROFLOXACIN IN D5W 400 MG/200ML IV SOLN
400.0000 mg | Freq: Two times a day (BID) | INTRAVENOUS | Status: DC
  Administered 2012-07-30 – 2012-07-31 (×2): 400 mg via INTRAVENOUS
  Filled 2012-07-30 (×4): qty 200

## 2012-07-30 MED ORDER — SODIUM CHLORIDE 0.9 % IV SOLN
INTRAVENOUS | Status: AC
  Administered 2012-07-30 – 2012-07-31 (×2): via INTRAVENOUS

## 2012-07-30 MED ORDER — HALOPERIDOL LACTATE 5 MG/ML IJ SOLN: 0.5000 mg | Freq: Four times a day (QID) | INTRAMUSCULAR | Status: DC | PRN

## 2012-07-30 NOTE — Progress Notes (Signed)
[facility]                                                                                Patient Demographics  Jay Walters, is a 79 y.o. male, DOB - 1931/10/13, RUE:[id number], JYN:[id number]  Admit date - 07/28/2012  Admitting Physician Jay Jenna Luo, MD  Outpatient Primary MD for the patient is Jay Mackintosh, MD  LOS - 2   Chief Complaint  Patient presents with  . resolved garbled speech         Assessment & Plan    1. Mild confusion, slurred speech due to comminution of delirium, Dilantin toxicity and UTI -   CT of the brain unremarkable, Dilantin held and being monitored by pharmacy, levels are coming down, will be monitored closely on a daily basis, non acute MRI of the brain withoutt CVA, stable on telemetry,  Rocephin for UTI. I suspect there is baseline early memory loss with mild dementia also contradicting to his problems. Monitor Dilantin levels may take a few days to trend down.   Will have PT and speech monitor him closely.       2. History of Seizure disorder  - And to have had a long-standing history of seizure disorder-and seems to be maintained on Dilantin-given significant supratherapeutic levels, may need to think of alternative therapy. For now just hold Dilantin till it normalizes, pharmacy monitoring.     3. . Primary osteoarthritis of right knee- status post recent right knee replacement  - Operative site  has large hematoma confirmed by x-ray, appreciate Ortho input ( Dr. Juliene Walters ).    4.. Atrial flutter  - Status post ablation - currently in sinus rhythm  - Maintained on digoxin and Coumadin (given history of CVA)  Monitor on telemetry, INR being monitored by pharmacy .      5. BPH (benign prostatic hyperplasia)  - Stable  - Continue Proscar     6. HTN (hypertension)  - Continue Cardura  Further plan will depend as patient's clinical course evolves and further radiologic and laboratory data become available.  Patient will be monitored closely.      DVT Prophylaxis:  Not needed as on Coumadin.  Lab Results  Component Value Date   INR 2.42* 07/30/2012   INR 3.02* 07/29/2012   INR 3.16* 07/28/2012     Code Status: Full  Family Communication: Discussed with patient and his son over the phone in detail, wife bedside  Disposition Plan: SNF   Procedures CT brain, MRI brain   Consults  Orthopedics   DVT Prophylaxis  Coumadin per pharmacy  Lab Results  Component Value Date   INR 2.42* 07/30/2012   INR 3.02* 07/29/2012   INR 3.16* 07/28/2012     Lab Results  Component Value Date   PLT 448* 07/29/2012    Medications  Scheduled Meds: . aspirin EC  81 mg Oral Daily  . cefTRIAXone (ROCEPHIN)  IV  1 g Intravenous Q24H  . digoxin  0.0625 mg Oral QODAY  . doxazosin  4 mg Oral QPM  . ferrous sulfate  325 mg Oral BID PC  . finasteride  5 mg Oral QPM  . polyethylene glycol  17 g Oral Daily  . sodium chloride  3 mL Intravenous Q12H  . Warfarin - Pharmacist Dosing Inpatient   Does not apply q1800   Continuous Infusions: . sodium chloride 50 mL/hr at 07/29/12 1100   PRN Meds:.acetaminophen, albuterol, guaiFENesin-dextromethorphan, haloperidol lactate, HYDROcodone-acetaminophen, methocarbamol, ondansetron (ZOFRAN) IV, ondansetron  Antibiotics    Anti-infectives   Start     Dose/Rate Route Frequency Ordered Stop   07/28/12 1700  cefTRIAXone (ROCEPHIN) 1 g in dextrose 5 % 50 mL IVPB     1 g 100 mL/hr over 30 Minutes Intravenous Every 24 hours 07/28/12 1631     07/28/12 1415  cefTRIAXone (ROCEPHIN) 1 g in dextrose 5 % 50 mL IVPB     1 g 100 mL/hr over 30 Minutes Intravenous  Once 07/28/12 1414 07/28/12 1510       Time Spent in minutes   35   Jay Walters K M.D on 07/30/2012 at 10:31 AM  Between 7am to 7pm - Pager - [phone]  After 7pm go to [email or web address] - password [email or web address]  And look for the night coverage person covering for me after hours  [facility] Office  [phone]    Subjective:   [address] today has, No headache, No chest pain, No abdominal pain - No Nausea, No new weakness tingling or numbness, No Cough - SOB.    Objective:   Filed Vitals:   07/29/12 2140 07/30/12 0143 07/30/12 0531 07/30/12 0700  BP: 114/54 121/53 106/46   Pulse: 51 50 51   Temp: 98.4 F (36.9 C) 98.1 F (36.7 C) 97.5 F (36.4 C)   TempSrc: Oral Oral Oral   Resp: 16 20 18    Height:    5\' 10"  (1.778 m)  Weight:      SpO2: 100% 99% 99%     Wt Readings from Last 3 Encounters:  07/29/12 54.1 kg (119 lb 4.3 oz)  07/11/12 58.4 kg (128 lb 12 oz)  07/11/12 58.4 kg (128 lb 12 oz)     Intake/Output Summary (Last 24 hours) at 07/30/12 1031 Last data filed at 07/30/12 0500  Gross per 24 hour  Intake    480 ml  Output    700 ml  Net   -220 ml    Exam Mildly sleepy but easily arousable and following commands, Oriented X 2, No new F.N deficits, Normal affect Jay Walters.AT,PERRAL Supple Neck,No JVD, No cervical lymphadenopathy appriciated.  Symmetrical Chest wall movement, Good air movement bilaterally, CTAB RRR,No Gallops,Rubs or new Murmurs, No Parasternal Heave +ve B.Sounds, Abd Soft, Non tender, No organomegaly appriciated, No rebound - guarding or rigidity. No Cyanosis, Clubbing or edema, No new Rash or bruise , right knee swollen with old moderate sized hematoma   Data Review   Micro Results Recent Results (from the past 240 hour(s))  URINE CULTURE     Status: None   Collection Time    07/28/12 12:39 PM      Result Value Range Status   Specimen Description URINE, RANDOM   Final   Special Requests NONE   Final   Culture  Setup Time 07/28/2012 20:15   Final   Colony Count >=100,000 COLONIES/ML   Final   Culture PSEUDOMONAS AERUGINOSA   Final   Report Status PENDING   Incomplete    Radiology Reports Dg Chest 2 View  07/04/2012  *RADIOLOGY REPORT*  Clinical Data: Preop respiratory exam for knee surgery. Hypertension.  CHEST - 2  VIEW  Comparison: 02/18/2010  Findings: Mild cardiomegaly  and ectasia of the thoracic aorta are stable.  Pulmonary hyperinflation is noted.  Both lungs are clear. No evidence of pleural effusion.  No mass or lymphadenopathy identified.  Mild thoracic dextroscoliosis is stable.  IMPRESSION: Stable cardiomegaly and probable COPD.  No active disease.   Original Report Authenticated By: Myles Rosenthal, M.D.    Ct Head Wo Contrast  07/28/2012  *RADIOLOGY REPORT*  Clinical Data: 79 year old male with slurred speech.  CT HEAD WITHOUT CONTRAST  Technique:  Contiguous axial images were obtained from the base of the skull through the vertex without contrast.  Comparison: 05/11/2011 and prior studies  Findings: Moderate chronic small vessel white matter ischemic changes are noted.  No acute intracranial abnormalities are identified, including mass lesion or mass effect, hydrocephalus, extra-axial fluid collection, midline shift, hemorrhage, or acute infarction.  The visualized bony calvarium is unremarkable.  IMPRESSION: No evidence of acute intracranial abnormality.  Chronic small vessel white matter ischemic changes.   Original Report Authenticated By: Harmon Pier, M.D.    Dg Knee Right Port  07/29/2012  *RADIOLOGY REPORT*  Clinical Data: 79 year old male status post knee arthroplasty. Swelling and bruising.  Question hematoma.  PORTABLE RIGHT KNEE - 1-2 VIEW  Comparison: 07/10/2012 and postoperative films.  Findings: AP portable cross-table lateral views.  Sequelae of total knee arthroplasty.  Hardware components appear stable and intact. Postoperative drain has been removed.  Postoperative gas has resolved.  There is increase soft tissue density diffusely about the knee, more in the suprapatellar region.  No fracture identified.  IMPRESSION: 1.  Increased soft tissue density about the knee especially the suprapatellar region.  Appearance favors soft tissue hematoma plus/minus moderate joint effusion. 2.  Total knee  arthroplasty components appear intact with stable alignment.   Original Report Authenticated By: Erskine Speed, M.D.    Dg Knee Right Port  07/10/2012  *RADIOLOGY REPORT*  Clinical Data: Right total knee arthroplasty.  PORTABLE RIGHT KNEE - 1-2 VIEW  Comparison: None  Findings: The tibial and femoral components appear well seated.  No complicating features are demonstrated.  IMPRESSION: Well seated components of a total right knee arthroplasty.   Original Report Authenticated By: Rudie Meyer, M.D.     CBC  Recent Labs Lab 07/28/12 1042 07/29/12 0507  WBC 6.3 6.1  HGB 11.2* 9.8*  HCT 34.8* 30.1*  PLT 478* 448*  MCV 95.3 93.8  MCH 30.7 30.5  MCHC 32.2 32.6  RDW 15.1 14.8  LYMPHSABS 0.8  --   MONOABS 0.5  --   EOSABS 0.0  --   BASOSABS 0.1  --     Chemistries   Recent Labs Lab 07/28/12 1042 07/29/12 0507 07/30/12 0613  NA 139 139 139  K 4.0 4.0 3.8  CL 102 103 103  CO2 29 29 27   GLUCOSE 119* 101* 95  BUN 19 18 15   CREATININE 0.73 0.64 0.65  CALCIUM 9.4 8.8 8.7  AST  --  20 17  ALT  --  22 19  ALKPHOS  --  138* 156*  BILITOT  --  0.3 0.4   ------------------------------------------------------------------------------------------------------------------ estimated creatinine clearance is 58.2 ml/min (by C-G formula based on Cr of 0.65). ------------------------------------------------------------------------------------------------------------------ No results found for this basename: HGBA1C,  in the last 72 hours ------------------------------------------------------------------------------------------------------------------ No results found for this basename: CHOL, HDL, LDLCALC, TRIG, CHOLHDL, LDLDIRECT,  in the last 72 hours ------------------------------------------------------------------------------------------------------------------ No results found for this basename: TSH, T4TOTAL, FREET3, T3FREE, THYROIDAB,  in the last 72  hours ------------------------------------------------------------------------------------------------------------------ No results found for  this basename: VITAMINB12, FOLATE, FERRITIN, TIBC, IRON, RETICCTPCT,  in the last 72 hours  Coagulation profile  Recent Labs Lab 07/28/12 1042 07/29/12 0507 07/30/12 0613  INR 3.16* 3.02* 2.42*    No results found for this basename: DDIMER,  in the last 72 hours  Cardiac Enzymes No results found for this basename: CK, CKMB, TROPONINI, MYOGLOBIN,  in the last 168 hours ------------------------------------------------------------------------------------------------------------------ No components found with this basename: POCBNP,

## 2012-07-30 NOTE — Progress Notes (Signed)
Physical Therapy Treatment Patient Details Name: Jay Walters MRN: [id number] DOB: Apr 29, 1932 Today's Date: 07/30/2012 Time: 7829-5621 PT Time Calculation (min): 26 min  PT Assessment / Plan / Recommendation Comments on Treatment Session  Pt unable to participate in PT this date due to significant lethargy. Pt function worse than yesterday. Pt curently requires total assist for all mobility and adls. acute PT to con't to follow to address R TKA and functional moblity.    Follow Up Recommendations  SNF     Does the patient have the potential to tolerate intense rehabilitation     Barriers to Discharge        Equipment Recommendations  None recommended by PT    Recommendations for Other Services    Frequency Min 5X/week   Plan Discharge plan remains appropriate;Frequency remains appropriate    Precautions / Restrictions Precautions Precautions: Knee;Fall Restrictions Weight Bearing Restrictions: No   Pertinent Vitals/Pain Pt unable to communicate    Mobility  Bed Mobility Bed Mobility: Supine to Sit Supine to Sit: With rails;HOB elevated;1: +1 Total assist Details for Bed Mobility Assistance: pt unable to initiate or participate in transfer Transfers Transfers: Sit to Stand;Stand to Sit;Stand Pivot Transfers Sit to Stand: 1: +2 Total assist;With upper extremity assist;From bed;From chair/3-in-1 Sit to Stand: Patient Percentage: 0% Stand to Sit: 1: +2 Total assist;With upper extremity assist;To bed Stand to Sit: Patient Percentage: 0% Stand Pivot Transfers: 1: +2 Total assist Stand Pivot Transfers: Patient Percentage: 0%, attempted to have pt use RW however unable to comprehend or hold self up using RW. Pt required bilat underarm assist Details for Transfer Assistance: pt did not assist with transfer. pt unable to maitnain standing once in standing without maxAx2. Ambulation/Gait Ambulation/Gait Assistance: Not tested (comment) (pt unable to at this time) Stairs: No     Exercises Total Joint Exercises Knee Flexion: PROM;Right;20 reps;Seated (achieved 80-90 deg of flex) Passive knee extension stretch x 10 reps x 20 sec hold   PT Diagnosis:    PT Problem List:   PT Treatment Interventions:     PT Goals Acute Rehab PT Goals PT Goal: Supine/Side to Sit - Progress: Progressing toward goal PT Goal: Sit to Supine/Side - Progress: Progressing toward goal PT Goal: Sit to Stand - Progress: Progressing toward goal PT Goal: Stand to Sit - Progress: Progressing toward goal Pt will Transfer Bed to Chair/Chair to Bed: with mod assist (with RW) PT Transfer Goal: Bed to Chair/Chair to Bed - Progress: Goal set today PT Goal: Ambulate - Progress: Progressing toward goal  Visit Information  Last PT Received On: 07/30/12 Assistance Needed: +2    Subjective Data  Subjective: Pt received supine in bed with lethargy. RN reports pt getting ativan last night.    Cognition  Cognition Overall Cognitive Status: Impaired Arousal/Alertness: Lethargic Orientation Level: Disoriented to;Place;Time;Situation Behavior During Session: Lethargic Following Commands: Follows one step commands consistently Cognition - Other Comments: pt with lethargy requiring max verbal cues to attempt to maintain eye opening however pt unable.    Balance  Balance Balance Assessed: Yes Static Sitting Balance Static Sitting - Balance Support: No upper extremity supported Static Sitting - Level of Assistance: 1: +1 Total assist Static Sitting - Comment/# of Minutes: pt unable to use bilat UE to maintain balance  End of Session PT - End of Session Equipment Utilized During Treatment: Gait belt Activity Tolerance: Treatment limited secondary to medication Patient left: in chair;with chair alarm set;with call bell/phone within reach Nurse Communication: Mobility status (pt up  in chair with chair alarm)   GP     Marcene Brawn 07/30/2012, 4:05 PM   Lewis Shock, PT, DPT Pager #:  [phone] Office #: [phone]

## 2012-07-30 NOTE — Progress Notes (Signed)
CRITICAL VALUE ALERT  Critical value received: Dilantin level of 38.2  Date of notification:  07/30/12  Time of notification:  0800  Critical value read back:yes  Nurse who received alert:  Rowe Robert  MD notified (1st page):  Yes, Dr Thedore Mins  Time of first page:  0815  MD notified (2nd page):  Time of second page:  Responding MD: Dr Thedore Mins  Time MD responded:  [phone]

## 2012-07-30 NOTE — Progress Notes (Signed)
ANTICOAGULATION & DILANTIN CONSULT NOTE - Follow Up Consult  Pharmacy Consult for Coumadin;  Dilantin Indication: hx afib/flutter, CVA, s/p R TKA 07/10/12;  hx seizures  No Known Allergies  Patient Measurements: Height: 5\' 10"  (177.8 cm) Weight: 119 lb 4.3 oz (54.1 kg) IBW/kg (Calculated) : 73  Vital Signs: Temp: 98.2 F (36.8 C) (02/18 1005) Temp src: Oral (02/18 1005) BP: 114/54 mmHg (02/18 1005) Pulse Rate: 63 (02/18 1005)  Labs:  Recent Labs  07/28/12 1042 07/29/12 0507 07/30/12 0613  HGB 11.2* 9.8*  --   HCT 34.8* 30.1*  --   PLT 478* 448*  --   LABPROT 30.7* 29.7* 25.2*  INR 3.16* 3.02* 2.42*  CREATININE 0.73 0.64 0.65    Estimated Creatinine Clearance: 58.2 ml/min (by C-G formula based on Cr of 0.65).  Assessment:   INR back into target range after holding Coumadin for 2 days.  May drop more by AM.  Large hematoma noted at right TKR operative site. Evaluated by Ortho.     Home Coumadin regimen:  5 mg daily except 7.5 mg on Sundays and Fridays.   Admit INR above goal.     Dilantin on hold due to supratherapeutic levels. Up to 38.2 mcg/ml today.  Albumin 2.6, corrected level ~ 60 mcg/ml. Noted mild confusion and slurred speech. Had been on Dilantin 300 mg qhs prior to admission. Albumin low, so corrected Dilantin levels are higher than reported values, due to higher percentage of unbound/free Dilantin.  Expect it will take at least a few more days for level to drop into goal range.     Pseudomonas in urine.  Spoke briefly with Dr. Thedore Mins.  Ceftriaxone discontinued.  Cipro 400 mg IV q12hrs to begin.  Cipro could potentially effect INR within a few days.  Goal of Therapy:  INR 2-3 Monitor platelets by anticoagulation protocol: Yes Dilantin levels 8-12 mcg/ml   - which would correspond/correct to levels 10-20 mcg/ml   Plan:    Coumadin 5 mg PO today.   Continue daily PT/INR.  Watch for Cipro effects.   Continue to hold Dilantin.  MD considering alternative  therapy.   Next Dilantin level 2/20.  Dennie Fetters, [place] Pager: [phone] 07/30/2012,3:00 PM

## 2012-07-31 LAB — PHENYTOIN LEVEL, TOTAL: Phenytoin Lvl: 33.8 ug/mL (ref 10.0–20.0)

## 2012-07-31 LAB — PROTIME-INR: Prothrombin Time: 21.9 seconds — ABNORMAL HIGH (ref 11.6–15.2)

## 2012-07-31 MED ORDER — WARFARIN SODIUM 5 MG PO TABS
5.0000 mg | ORAL_TABLET | Freq: Once | ORAL | Status: AC
  Administered 2012-07-31: 5 mg via ORAL
  Filled 2012-07-31: qty 1

## 2012-07-31 MED ORDER — CIPROFLOXACIN HCL 500 MG PO TABS
500.0000 mg | ORAL_TABLET | Freq: Two times a day (BID) | ORAL | Status: DC
  Filled 2012-07-31 (×2): qty 1

## 2012-07-31 MED ORDER — CIPROFLOXACIN HCL 500 MG PO TABS
500.0000 mg | ORAL_TABLET | Freq: Two times a day (BID) | ORAL | Status: DC
  Administered 2012-07-31 – 2012-08-02 (×4): 500 mg via ORAL
  Filled 2012-07-31 (×5): qty 1

## 2012-07-31 MED ORDER — CIPROFLOXACIN HCL 500 MG PO TABS
500.0000 mg | ORAL_TABLET | Freq: Two times a day (BID) | ORAL | Status: DC
  Filled 2012-07-31 (×3): qty 1

## 2012-07-31 NOTE — Progress Notes (Signed)
ANTICOAGULATION & DILANTIN CONSULT NOTE - Follow Up Consult  Pharmacy Consult for Coumadin & Dilantin Indication: atrial fibrillation; Seizure disorder  Labs:  Recent Labs  07/28/12 1042 07/29/12 0507 07/30/12 0613 07/31/12 0538  HGB 11.2* 9.8*  --   --   HCT 34.8* 30.1*  --   --   PLT 478* 448*  --   --   LABPROT 30.7* 29.7* 25.2* 21.9*  INR 3.16* 3.02* 2.42* 2.00*  CREATININE 0.73 0.64 0.65  --     Estimated Creatinine Clearance: 58.9 ml/min (by C-G formula based on Cr of 0.65).   Assessment: 65 YOM with atrial fibrillation and history of CVA on chronic Coumadin. INR at 2 today. Coumadin resumed 2/18. CBC stable as of 2/17. No overt bleeding noted. Patient was started on Cipro which cause a rise in the INR.   Dilantin remains on hold. Dilantin level today was 38.2 which when corrected for low albumin is 54.5 (goal level 10-20) . Patient remains bradycardic with soft blood pressures. No nystagmus reported. MD considering alternative medication. Next DPH level is 2/20.   Goal of Therapy:  INR 2-3 Dilantin levels 10-20 mcg/ml once corrected for low albumin  Plan:  Coumadin 5 mg again today - Daily PT/INR  - cont to Hold phenytoin - recheck level 2/20 - watch for Cipro effect on INR (separate doses from Fe if chg to PO)   Lonzo Cloud, Gala Lewandowsky 07/31/2012,10:12 AM

## 2012-07-31 NOTE — Progress Notes (Signed)
PROGRESS NOTE  Nealy Karapetian ZOX:[id number] DOB: 1931-11-11 DOA: 07/28/2012 PCP: [name], MD  Brief narrative: 79 year old male admitted 07/28/2012 with slurred speech and disorientation. Initial workup revealed CT head which is negative the Dilantin level is 41.7. Because of right knee pain subsequent to recent right total knee replacement orthopedics was consulted-he was also recently treated for UTI at nursing facility-physical therapy and speech therapy involved in his care recommended replacement, no speech needs were identified.  Past medical history-As per Problem list Chart reviewed as below-  Admission 07/10/2012 for right knee arthritis with resultant R TKR 1.29  Admission 07/28/2011 for a flutter with radiofrequency ablation at that time-placed on Coumadin  Admission 05/08/2011 for possible transverse myelitis with numbness and weakness of bilateral upper and lower extremities.   Consultants:  None currently  Procedures:  CT head without contrast 2.16  Portable knee x-ray right knee 2.17  MRI brain 2/70  Antibiotics:  Rocephin 2/16-->2/18  Cipro 500 IV 2/18  Cipro 500 bid 2/18 >> 2/21   Subjective  Doing fair.  Tired of being here.  Cannot really ambulate well given recent Knee replacement No n/v/cp/sob Hasn't passed much stool as yet.     Objective    Interim History: PT evaluated on 2/18 and recommended SNF placement  Telemetry: PVC's  Objective: Filed Vitals:   07/30/12 2203 07/31/12 0211 07/31/12 0500 07/31/12 0612  BP: 106/48 118/53  117/54  Pulse: 54 56  55  Temp: 98.2 F (36.8 C) 98.5 F (36.9 C)  98.7 F (37.1 C)  TempSrc: Oral Oral  Oral  Resp: 16 18  18   Height:      Weight:   54.7 kg (120 lb 9.5 oz)   SpO2: 100% 99%  100%   No intake or output data in the 24 hours ending 07/31/12 8119  Exam:  General: Alert pleasant oriened to time place and person Respiratory: CTA b, no added sound Skin Neuro-Nad  Data  Reviewed: Basic Metabolic Panel:  Recent Labs Lab 07/28/12 1042 07/29/12 0507 07/30/12 0613  NA 139 139 139  K 4.0 4.0 3.8  CL 102 103 103  CO2 29 29 27   GLUCOSE 119* 101* 95  BUN 19 18 15   CREATININE 0.73 0.64 0.65  CALCIUM 9.4 8.8 8.7   Liver Function Tests:  Recent Labs Lab 07/29/12 0507 07/30/12 0613  AST 20 17  ALT 22 19  ALKPHOS 138* 156*  BILITOT 0.3 0.4  PROT 6.8 6.6  ALBUMIN 2.6* 2.6*   No results found for this basename: LIPASE, AMYLASE,  in the last 168 hours No results found for this basename: AMMONIA,  in the last 168 hours CBC:  Recent Labs Lab 07/28/12 1042 07/29/12 0507  WBC 6.3 6.1  NEUTROABS 5.0  --   HGB 11.2* 9.8*  HCT 34.8* 30.1*  MCV 95.3 93.8  PLT 478* 448*   Cardiac Enzymes: No results found for this basename: CKTOTAL, CKMB, CKMBINDEX, TROPONINI,  in the last 168 hours BNP: No components found with this basename: POCBNP,  CBG:  Recent Labs Lab 07/29/12 0642 07/30/12 0650 07/31/12 0648  GLUCAP 103* 90 112*    Recent Results (from the past 240 hour(s))  URINE CULTURE     Status: None   Collection Time    07/28/12 12:39 PM      Result Value Range Status   Specimen Description URINE, RANDOM   Final   Special Requests NONE   Final   Culture  Setup Time 07/28/2012 20:15  Final   Colony Count >=100,000 COLONIES/ML   Final   Culture PSEUDOMONAS AERUGINOSA   Final   Report Status 07/30/2012 FINAL   Final   Organism ID, Bacteria PSEUDOMONAS AERUGINOSA   Final     Studies:              All Imaging reviewed and is as per above notation   Scheduled Meds: . aspirin EC  81 mg Oral Daily  . ciprofloxacin  400 mg Intravenous Q12H  . digoxin  0.0625 mg Oral QODAY  . doxazosin  4 mg Oral QPM  . ferrous sulfate  325 mg Oral BID PC  . finasteride  5 mg Oral QPM  . polyethylene glycol  17 g Oral Daily  . sodium chloride  3 mL Intravenous Q12H  . Warfarin - Pharmacist Dosing Inpatient   Does not apply q1800   Continuous  Infusions: . sodium chloride 50 mL/hr at 07/31/12 0231     Assessment/Plan: 1. Mild confusion, slurred speech due to comminution of delirium, Dilantin toxicity-CT of the brain unremarkable, Dilantin 300 qhs held and being monitored by pharmacy, levels are down, will be monitored - non acute MRI of the brain without CVA, stable on telemetry.  Monitor Dilantin levels may take a few days to trend down. Physical therapy is recommending skilled nursing placement. 2. Pseudomonas UTI-UC from 2.18 showed pan-sensitive  Culture.  Transition to PO cipro, stop date 2/21 3. History of Seizure disorder - eems to be maintained on Dilantin-given significant supratherapeutic levels, may need to think of alternative therapy. For now just hold Dilantin till it normalizes, pharmacy monitoring.  4. Primary osteoarthritis of right knee- status post recent right knee replacement - Operative site has large hematoma confirmed by x-ray, appreciate Ortho input ( Dr. Juliene Pina ).   5. Atrial flutter - Status post ablation - currently in sinus rhythm - Maintained on digoxin and Coumadin (given history of CVA) Monitor on telemetry, INR being monitored by pharmacy.  6.BPH (benign prostatic hyperplasia) - Stable-Continue Proscar 5 mg daily 7. HTN (hypertension) - Continue Cardura 4 mg daily 8. History of dysphasia-sees gastroenterology Dr. Tracey Harries esophageal dilatation performed 3.27.12 9. Ground glass opacities on CT scan-workup by pulmonology 09/19/2011-this seems to be stable issue-needs outpatient followup DVT Prophylaxis: Not needed as on Coumadin.  Code Status: Full Family Communication: None at bedside-patient able to discuss with family Disposition Plan: Inpatient-Dilantin levels to be monitored   Pleas Koch, MD  [facility] Pager [phone] 07/31/2012, 8:32 AM    LOS: 3 days

## 2012-07-31 NOTE — Progress Notes (Signed)
Physical Therapy Treatment Patient Details Name: Jay Walters MRN: [id number] DOB: Sep 26, 1931 Today's Date: 07/31/2012 Time: 1010-1035 PT Time Calculation (min): 25 min  PT Assessment / Plan / Recommendation Comments on Treatment Session  Pt with significant generalized weakness, limiting his ability to sit up.  Pt aware of issues and trying to work with therapy to improve mobility status.  Will benefit from continued therapy to address mobility issues.    Follow Up Recommendations  SNF     Does the patient have the potential to tolerate intense rehabilitation     Barriers to Discharge        Equipment Recommendations  None recommended by PT    Recommendations for Other Services    Frequency Min 5X/week   Plan Discharge plan remains appropriate;Frequency remains appropriate    Precautions / Restrictions Precautions Precautions: Fall;Knee   Pertinent Vitals/Pain See flowsheet   Mobility  Bed Mobility Bed Mobility: Rolling Left;Right Sidelying to Sit Rolling Left: 5: Supervision Right Sidelying to Sit: 5: Supervision Supine to Sit: 5: Supervision Transfers Transfers: Sit to Stand;Stand Pivot Transfers;Stand to Sit Sit to Stand: 2: Max assist;From elevated surface;With upper extremity assist;From bed Sit to Stand: Patient Percentage: 40% Stand to Sit: 2: Max assist;With upper extremity assist;With armrests;To chair/3-in-1 Stand to Sit: Patient Percentage: 40% Stand Pivot Transfers: 2: Max assist;From elevated surface;With armrests Stand Pivot Transfers: Patient Percentage: 40% Details for Transfer Assistance: Pt unable to stand fully erect, trunk leaning to the left and forward, hip and knee flexion.  Requested nursing student to stand by to provide assistance if needed. Ambulation/Gait Ambulation/Gait Assistance: Not tested (comment) (pt unable to fully stand)    Exercises Total Joint Exercises Ankle Circles/Pumps: AROM;Both;10 reps Quad Sets: AROM;Both;10  reps;Supine Towel Squeeze: AROM;Both;10 reps;Supine Short Arc Quad: AAROM;Right;10 reps Heel Slides: AAROM;Right;10 reps;Supine   PT Diagnosis:    PT Problem List:   PT Treatment Interventions:     PT Goals Acute Rehab PT Goals PT Goal: Supine/Side to Sit - Progress: Progressing toward goal PT Goal: Sit to Supine/Side - Progress: Progressing toward goal PT Goal: Sit to Stand - Progress: Progressing toward goal PT Goal: Stand to Sit - Progress: Progressing toward goal PT Transfer Goal: Bed to Chair/Chair to Bed - Progress: Progressing toward goal PT Goal: Ambulate - Progress: Progressing toward goal PT Goal: Up/Down Stairs - Progress: Progressing toward goal  Visit Information  Last PT Received On: 07/30/12 Assistance Needed: +2    Subjective Data  Subjective: Pt reports that he feels better but reports that he just does not have any strength. Patient Stated Goal: To be able to walk   Cognition  Cognition Overall Cognitive Status: Appears within functional limits for tasks assessed/performed Arousal/Alertness: Awake/alert Orientation Level: Appears intact for tasks assessed Behavior During Session: [facility] for tasks performed Following Commands: Follows multi-step commands consistently Awareness of Deficits: Pt reporting that he is having difficulty maintaining his balance and muscle control    Balance  Static Sitting Balance Static Sitting - Balance Support: Bilateral upper extremity supported Static Sitting - Level of Assistance: 3: Mod assist Static Sitting - Comment/# of Minutes: Pt unable to completely control his trunk, leaning forward and to the left.  When he would try to control and sit up he would "bobble"  End of Session PT - End of Session Equipment Utilized During Treatment: Gait belt Activity Tolerance: Patient tolerated treatment well Patient left: in chair;with call bell/phone within reach;with chair alarm set;with nursing in room   GP  Georges Mouse 07/31/2012, 10:52 AM

## 2012-08-01 LAB — CBC
HCT: 29 % — ABNORMAL LOW (ref 39.0–52.0)
Hemoglobin: 9.6 g/dL — ABNORMAL LOW (ref 13.0–17.0)
RDW: 15 % (ref 11.5–15.5)
WBC: 5.6 10*3/uL (ref 4.0–10.5)

## 2012-08-01 LAB — BASIC METABOLIC PANEL
CO2: 28 mEq/L (ref 19–32)
Calcium: 8.5 mg/dL (ref 8.4–10.5)
Chloride: 102 mEq/L (ref 96–112)
Creatinine, Ser: 0.72 mg/dL (ref 0.50–1.35)
GFR calc Af Amer: >90 mL/min (ref >90)
Sodium: 138 mEq/L (ref 135–145)

## 2012-08-01 LAB — GLUCOSE, CAPILLARY: Glucose-Capillary: 101 mg/dL — ABNORMAL HIGH (ref 70–99)

## 2012-08-01 LAB — PROTIME-INR: INR: 2.16 — ABNORMAL HIGH (ref 0.00–1.49)

## 2012-08-01 MED ORDER — WARFARIN SODIUM 5 MG PO TABS
5.0000 mg | ORAL_TABLET | Freq: Once | ORAL | Status: AC
  Administered 2012-08-01: 5 mg via ORAL
  Filled 2012-08-01: qty 1

## 2012-08-01 NOTE — Progress Notes (Signed)
PROGRESS NOTE  Jay Walters JWJ:[id number] DOB: Sep 21, 1931 DOA: 07/28/2012 PCP: [name], MD  Brief narrative: 79 year old male admitted 07/28/2012 with slurred speech and disorientation. Initial workup revealed CT head which is negative the Dilantin level is 41.7. Because of right knee pain subsequent to recent right total knee replacement orthopedics was consulted-he was also recently treated for UTI at nursing facility-physical therapy and speech therapy involved in his care recommended replacement, no speech needs were identified.  Past medical history-As per Problem list Chart reviewed as below-  Admission 07/10/2012 for right knee arthritis with resultant R TKR 1.29  Admission 07/28/2011 for a flutter with radiofrequency ablation at that time-placed on Coumadin  Admission 05/08/2011 for possible transverse myelitis with numbness and weakness of bilateral upper and lower extremities.   Consultants:  None currently  Procedures:  CT head without contrast 2.16  Portable knee x-ray right knee 2.17  MRI brain 2/70  Antibiotics:  Rocephin 2/16-->2/18  Cipro 500 IV 2/18  Cipro 500 bid 2/18 >> 2/21   Subjective  Cannot really ambulate well given recent Knee replacement denies any specfici pain at rest but painful with movement    Objective    Interim History: PT evaluated on 2/20 and recommended SNF placement  Telemetry: PVC's  Objective: Filed Vitals:   07/31/12 2217 08/01/12 0232 08/01/12 0624 08/01/12 1013  BP: 111/46 116/50 121/54   Pulse: 57 58 57 65  Temp: 98.7 F (37.1 C) 98.7 F (37.1 C) 98.8 F (37.1 C)   TempSrc: Oral Oral Oral   Resp: 20 20 18    Height:      Weight:      SpO2: 99% 98% 99%     Intake/Output Summary (Last 24 hours) at 08/01/12 1327 Last data filed at 08/01/12 0232  Gross per 24 hour  Intake    200 ml  Output    750 ml  Net   -550 ml    Exam:  General: Alert pleasant oriened to time place and person Respiratory:  CTA b, no added sound Skin Neuro-Nad  Data Reviewed: Basic Metabolic Panel:  Recent Labs Lab 07/28/12 1042 07/29/12 0507 07/30/12 0613 08/01/12 0515  NA 139 139 139 138  K 4.0 4.0 3.8 4.0  CL 102 103 103 102  CO2 29 29 27 28   GLUCOSE 119* 101* 95 103*  BUN 19 18 15 13   CREATININE 0.73 0.64 0.65 0.72  CALCIUM 9.4 8.8 8.7 8.5   Liver Function Tests:  Recent Labs Lab 07/29/12 0507 07/30/12 0613  AST 20 17  ALT 22 19  ALKPHOS 138* 156*  BILITOT 0.3 0.4  PROT 6.8 6.6  ALBUMIN 2.6* 2.6*   No results found for this basename: LIPASE, AMYLASE,  in the last 168 hours No results found for this basename: AMMONIA,  in the last 168 hours CBC:  Recent Labs Lab 07/28/12 1042 07/29/12 0507 08/01/12 0515  WBC 6.3 6.1 5.6  NEUTROABS 5.0  --   --   HGB 11.2* 9.8* 9.6*  HCT 34.8* 30.1* 29.0*  MCV 95.3 93.8 92.4  PLT 478* 448* 363   Cardiac Enzymes: No results found for this basename: CKTOTAL, CKMB, CKMBINDEX, TROPONINI,  in the last 168 hours BNP: No components found with this basename: POCBNP,  CBG:  Recent Labs Lab 07/29/12 0642 07/30/12 0650 07/31/12 0648 08/01/12 0651  GLUCAP 103* 90 112* 101*    Recent Results (from the past 240 hour(s))  URINE CULTURE     Status: None   Collection Time  07/28/12 12:39 PM      Result Value Range Status   Specimen Description URINE, RANDOM   Final   Special Requests NONE   Final   Culture  Setup Time 07/28/2012 20:15   Final   Colony Count >=100,000 COLONIES/ML   Final   Culture PSEUDOMONAS AERUGINOSA   Final   Report Status 07/30/2012 FINAL   Final   Organism ID, Bacteria PSEUDOMONAS AERUGINOSA   Final     Studies:              All Imaging reviewed and is as per above notation   Scheduled Meds: . aspirin EC  81 mg Oral Daily  . ciprofloxacin  500 mg Oral BID  . digoxin  0.0625 mg Oral QODAY  . doxazosin  4 mg Oral QPM  . ferrous sulfate  325 mg Oral BID PC  . finasteride  5 mg Oral QPM  . polyethylene glycol   17 g Oral Daily  . sodium chloride  3 mL Intravenous Q12H  . warfarin  5 mg Oral ONCE-1800  . Warfarin - Pharmacist Dosing Inpatient   Does not apply q1800   Continuous Infusions:     Assessment/Plan: 1. Mild confusion, slurred speech due to comminution of delirium, Dilantin toxicity-CT of the brain unremarkable, Dilantin 300 qhs held and being monitored by pharmacy, levels are down, will be monitored - non acute MRI of the brain without CVA, stable on telemetry.  Dilantin levels may take a few days to trend down. Physical therapy is recommending skilled nursing placement. 2. Pseudomonas UTI-UC from 2.18 showed pan-sensitive culture.  Transition to PO cipro, stop date 2/21 3. History of Seizure disorder - seems to be maintained on Dilantin-given significant supratherapeutic levels, may need to think of alternative therapy. For now just hold Dilantin till it normalizes, pharmacy monitoring.  4. Primary osteoarthritis of right knee- status post recent right knee replacement - Operative site has large hematoma confirmed by x-ray, appreciate Ortho input ( Dr. Juliene Pina ).   5. Atrial flutter - Status post ablation - currently in sinus rhythm - Maintained on digoxin and Coumadin (given history of CVA) Monitor on telemetry, INR being monitored by pharmacy.  6.BPH (benign prostatic hyperplasia) - Stable-Continue Proscar 5 mg daily 7. HTN (hypertension) - Continue Cardura 4 mg daily 8. History of dysphasia-sees gastroenterology Dr. Tracey Harries esophageal dilatation performed 3.27.12 9. Ground glass opacities on CT scan-workup by pulmonology 09/19/2011-this seems to be stable issue-needs outpatient followup DVT Prophylaxis: Not needed as on Coumadin.  Code Status: Full Family Communication: None at bedside. Disposition Plan: Inpatient-Dilantin levels to be monitored   Pleas Koch, MD  [facility] Pager [phone] 08/01/2012, 1:27 PM    LOS: 4 days

## 2012-08-01 NOTE — Progress Notes (Signed)
Lab called and stated that [name] Dilantin level is 31.2. Dr. Was notified, will continue to monitor.

## 2012-08-01 NOTE — Progress Notes (Signed)
ANTICOAGULATION & DILANTIN CONSULT NOTE - Follow Up Consult  Pharmacy Consult for Coumadin & Dilantin Indication: atrial fibrillation; Seizure disorder  Labs:  Recent Labs  07/30/12 0613 07/31/12 0538 08/01/12 0515  HGB  --   --  9.6*  HCT  --   --  29.0*  PLT  --   --  363  LABPROT 25.2* 21.9* 23.2*  INR 2.42* 2.00* 2.16*  CREATININE 0.65  --  0.72    Estimated Creatinine Clearance: 58.9 ml/min (by C-G formula based on Cr of 0.72).   Assessment: 44 YOM with atrial fibrillation and history of CVA on chronic Coumadin. INR at 2.16 today-therapeutic.  H/H trending down, but platelets stable. No overt bleeding noted. Patient was started on Cipro which cause a rise in the INR and will monitor closely.   Dilantin remains on hold. Dilantin level today was 31.2 which when corrected for low albumin is ~50 (goal level 10-20) . Patient's HR and BP have improved. No nystagmus reported. MD considering alternative medication. DPH levels ordered daily per MD.   Goal of Therapy:  INR 2-3 Dilantin levels 10-20 mcg/ml once corrected for low albumin  Plan:  - Coumadin 5 mg again today - Daily PT/INR  - cont to Hold phenytoin - follow-up daily DPH level - Will repeat albumin level to assess DPH level - watch for Cipro effect on INR (separate doses from Fe if chg to PO)   Link Snuffer, PharmD, BCPS Clinical Pharmacist [phone] 08/01/2012,10:20 AM

## 2012-08-01 NOTE — Progress Notes (Signed)
Physical Therapy Treatment Patient Details Name: Jay Walters MRN: [id number] DOB: 1931-06-18 Today's Date: 08/01/2012 Time: 3086-5784 PT Time Calculation (min): 18 min  PT Assessment / Plan / Recommendation Comments on Treatment Session  Pt cont's to present with significant weakness.  Rt knee buckling with WBing-- pt may benefit from KI to provide increased stability.      Follow Up Recommendations  SNF     Does the patient have the potential to tolerate intense rehabilitation     Barriers to Discharge        Equipment Recommendations  None recommended by PT    Recommendations for Other Services    Frequency Min 5X/week   Plan Discharge plan remains appropriate;Frequency remains appropriate    Precautions / Restrictions Precautions Precautions: Fall;Knee Restrictions Weight Bearing Restrictions: No       Mobility  Bed Mobility Bed Mobility: Supine to Sit;Sitting - Scoot to Edge of Bed Supine to Sit: 5: Supervision;HOB elevated;With rails Sitting - Scoot to Edge of Bed: 5: Supervision Details for Bed Mobility Assistance: Pt uses UE's to (A) with moving R LE.  Pt with decreased trunk stability as he was scooting to EOB but did not require physical (A) to maintain.   Transfers Transfers: Sit to Stand;Stand to [employer] Transfers Sit to Stand: 3: Mod assist;With upper extremity assist;With armrests;From bed;From chair/3-in-1 Stand to Sit: 3: Mod assist;With upper extremity assist;With armrests;To chair/3-in-1 Stand Pivot Transfers: 2: Max assist Details for Transfer Assistance: Pt's Rt knee continuously buckling.  Max cues for sequencing, use of UE's for increased stability, tall posture, & RW management.    Ambulation/Gait Ambulation/Gait Assistance: 1: +2 Total assist Ambulation/Gait: Patient Percentage: 40% Ambulation Distance (Feet):  (4 steps forwards) Assistive device: Rolling walker Ambulation/Gait Assistance Details: (A) for balance & safety.  Pt's Rt knee  buckling, pt also with flexed posture, & unable to use UE's sufficiently enough to maintain upright stance with Lt swing phase.   Stairs: No Wheelchair Mobility Wheelchair Mobility: No    Exercises Total Joint Exercises Ankle Circles/Pumps: AROM;Both;10 reps Quad Sets: AROM;Both;10 reps Heel Slides: AROM;Right;5 reps Long Arc Quad: AROM;Right;10 reps    PT Goals Acute Rehab PT Goals Time For Goal Achievement: 08/12/12 Potential to Achieve Goals: Fair Pt will go Supine/Side to Sit: with supervision;with HOB 0 degrees PT Goal: Supine/Side to Sit - Progress: Met Pt will go Sit to Supine/Side: with min assist;with HOB 0 degrees Pt will go Sit to Stand: with mod assist;with upper extremity assist PT Goal: Sit to Stand - Progress: Progressing toward goal Pt will go Stand to Sit: with supervision PT Goal: Stand to Sit - Progress: Progressing toward goal Pt will Transfer Bed to Chair/Chair to Bed: with mod assist PT Transfer Goal: Bed to Chair/Chair to Bed - Progress: Progressing toward goal Pt will Ambulate: 16 - 50 feet;with mod assist;with rolling walker PT Goal: Ambulate - Progress: Progressing toward goal Pt will Go Up / Down Stairs: 1-2 stairs;with least restrictive assistive device;with min assist  Visit Information  Last PT Received On: 08/01/12 Assistance Needed: +2    Subjective Data      Cognition  Cognition Overall Cognitive Status: Appears within functional limits for tasks assessed/performed Arousal/Alertness: Awake/alert Orientation Level: Appears intact for tasks assessed Behavior During Session: [facility] for tasks performed    Balance  Balance Balance Assessed: No  End of Session PT - End of Session Equipment Utilized During Treatment: Gait belt Activity Tolerance: Patient tolerated treatment well Patient left: in chair;with call  bell/phone within reach;with chair alarm set Nurse Communication: Mobility status     Verdell Face, [place] [phone] 08/01/2012

## 2012-08-02 ENCOUNTER — Telehealth: Payer: Self-pay | Admitting: Internal Medicine

## 2012-08-02 LAB — GLUCOSE, CAPILLARY: Glucose-Capillary: 95 mg/dL (ref 70–99)

## 2012-08-02 MED ORDER — PHENYTOIN SODIUM EXTENDED 100 MG PO CAPS: 100.0000 mg | ORAL_CAPSULE | Freq: Every day | ORAL | Status: DC

## 2012-08-02 MED ORDER — WARFARIN SODIUM 5 MG PO TABS
5.0000 mg | ORAL_TABLET | Freq: Once | ORAL | Status: AC
  Administered 2012-08-02: 5 mg via ORAL
  Filled 2012-08-02: qty 1

## 2012-08-02 MED ORDER — HYDROCODONE-ACETAMINOPHEN 5-325 MG PO TABS: 1.0000 | ORAL_TABLET | ORAL | Status: DC | PRN

## 2012-08-02 NOTE — Clinical Social Work Note (Signed)
Clinical Social Work   CSW met with pt and provided bed offers. Pt would like this CSW to follow up with his wife regarding bed choice. CSW left a message with pt's wife requesting a return phone call. CSW will continue to follow to facilitate discharge to SNF, as pt is ready for discharge.   Dede Query, MSW, Theresia Majors [phone]

## 2012-08-02 NOTE — Discharge Summary (Signed)
Physician Discharge Summary  Jay Walters ZOX:[id number] DOB: [birth year] DOA: 07/28/2012  PCP: [name], MD  Admit date: 07/28/2012 Discharge date: 08/02/2012  Time spent: 35 minutes  Recommendations for Outpatient Follow-up:  1. Recommend starting Dilantin on the 23rd if his corrected Dilantin level is around 20. This was 34 on discharge 2. Patient needs continued care skilled nursing facility with physical therapy  3. Patient needs an INR check probably in 3-4 days patient was on ciprofloxacin for UTI which may alter INR levels  4. Consider outpatient followup for ground glass opacities on CT scan 09/19/2011   Discharge Diagnoses:  Principal Problem:   Dilantin toxicity Active Problems:   Seizure disorder   BPH (benign prostatic hyperplasia)   Atrial flutter   Primary osteoarthritis of right knee   HTN (hypertension)   Discharge Condition: Good  Diet recommendation: Heart healthy vitamin K controlled  Filed Weights   07/29/12 0606 07/31/12 0500  Weight: 54.1 kg (119 lb 4.3 oz) 54.7 kg (120 lb 9.5 oz)    History of present illness:  79 year old male admitted 07/28/2012 with slurred speech and disorientation. Initial workup revealed CT head which is negative the Dilantin level is 41.7. Because of right knee pain subsequent to recent right total knee replacement orthopedics was consulted-he was also recently treated for UTI at nursing facility-physical therapy and speech therapy involved in his care recommended replacement, no speech needs were identified.   Hospital Course:   1. Mild confusion, slurred speech due to comminution of delirium, Dilantin toxicity-CT of the brain unremarkable,non acute MRI of the brain without CVA- Dilantin 300 qhs held and being monitored by pharmacy-discussed briefly with neurologist Dr. Amada Jupiter who recommended since Dilantin is 0 order, could potentially restart this at a lower dose. Discussed with pharmacist who recommended holding for one  to 2 days and then checking blood and restarting at 100 mg and for that titration can occur as an outpatient 2. Pseudomonas UTI-UC from 2.18 showed pan-sensitive culture. Transition to PO cipro, stop date 2/2-completed course in Hospital 3. History of Seizure disorder - seems to be maintained on Dilantin. 4. Primary osteoarthritis of right knee- status post recent right knee replacement - Operative site has large hematoma confirmed by x-ray, appreciate Ortho input ( Dr. Juliene Pina ) who saw him as he had a swollen knee [surgery performed 1.29.14]-this was not thought to be infected   5. Atrial flutter - Status post ablation - currently in sinus rhythm - Maintained on digoxin 0.625 qod and Coumadin (given history of CVA) Monitor on telemetry, INR was 2.3 on discharge 6.BPH (benign prostatic hyperplasia) - Stable-Continue Proscar 5 mg daily  7. HTN (hypertension) - Continue Cardura 4 mg daily  8. History of dysphasia-sees gastroenterology Dr. Tracey Harries esophageal dilatation performed 3.27.12  9. Ground glass opacities on CT scan-workup by pulmonology 09/19/2011-this seems to be stable issue-needs outpatient followup  DVT Prophylaxis: Not needed as on Coumadin.   Chart reviewed as below-  Admission 07/10/2012 for right knee arthritis with resultant R TKR 1.29  Admission 07/28/2011 for a flutter with radiofrequency ablation at that time-placed on Coumadin  Admission 05/08/2011 for possible transverse myelitis with numbness and weakness of bilateral upper and lower extremities.  Consultants:   Ortho Procedures:  CT head without contrast 2.16  Portable knee x-ray right knee 2.17  MRI brain 2/20  Antibiotics:  Rocephin 2/16-->2/18  Cipro 500 IV 2/18  Cipro 500 bid 2/18 >> 2/21  Discharge Exam: Filed Vitals:   08/01/12 2127 08/02/12 0224 08/02/12  0602 08/02/12 1041  BP: 109/50 125/48 117/51 128/57  Pulse: 56 57 58 64  Temp: 98.5 F (36.9 C) 98.8 F (37.1 C) 98.9 F (37.2 C) 98.2 F  (36.8 C)  TempSrc: Oral Oral Oral Oral  Resp: 16 16 18 16   Height:      Weight:      SpO2: 99% 99% 98% 100%   Alert pleasant, NAD currently Tolerating diet Pain controlled   Cardiovascular: s1 s2 no m/r/g Respiratory:  Clinically clear  Discharge Instructions  Discharge Orders   Future Appointments Provider Department Dept Phone   10/07/2012 9:00 AM [name], MD Sharlet Salina, MD [phone]   10/08/2012 10:00 AM [name], MD Sharlet Salina, MD [phone]   Future Orders Complete By Expires     Call MD for:  difficulty breathing, headache or visual disturbances  As directed     Call MD for:  hives  As directed     Call MD for:  severe uncontrolled pain  As directed     Call MD for:  temperature >100.4  As directed     Diet - low sodium heart healthy  As directed     Increase activity slowly  As directed         Medication List    TAKE these medications       digoxin 0.125 MG tablet  Commonly known as:  LANOXIN  Take 0.0625 mg by mouth every other day. Takes 1/2 tablet every other day in the morning     doxazosin 4 MG tablet  Commonly known as:  CARDURA  Take 4 mg by mouth every evening.     ferrous sulfate 325 (65 FE) MG tablet  Take 325 mg by mouth 2 (two) times daily after a meal.     finasteride 5 MG tablet  Commonly known as:  PROSCAR  Take 1 tablet by mouth every evening.     HYDROcodone-acetaminophen 5-325 MG per tablet  Commonly known as:  NORCO/VICODIN  Take 1-2 tablets by mouth every 4 (four) hours as needed.     methocarbamol 500 MG tablet  Commonly known as:  ROBAXIN  Take 1 tablet (500 mg total) by mouth every 6 (six) hours as needed.     oxyCODONE-acetaminophen 5-325 MG per tablet  Commonly known as:  PERCOCET/ROXICET  Take 1-2 tablets by mouth every 4 (four) hours as needed (Q4-6 hours PRN).     phenytoin 100 MG ER capsule  Commonly known as:  DILANTIN  Take 1 capsule (100 mg total) by mouth at bedtime.  Start taking on:   08/04/2012     polyethylene glycol packet  Commonly known as:  MIRALAX / GLYCOLAX  Take 17 g by mouth daily as needed.     warfarin 5 MG tablet  Commonly known as:  COUMADIN  Take 5-7.5 mg by mouth daily. 1.5 tablets on sunday,  and fridays. Take 1 tablet the rest of the week.          The results of significant diagnostics from this hospitalization (including imaging, microbiology, ancillary and laboratory) are listed below for reference.    Significant Diagnostic Studies: Dg Chest 2 View  07/04/2012  *RADIOLOGY REPORT*  Clinical Data: Preop respiratory exam for knee surgery. Hypertension.  CHEST - 2 VIEW  Comparison: 02/18/2010  Findings: Mild cardiomegaly and ectasia of the thoracic aorta are stable.  Pulmonary hyperinflation is noted.  Both lungs are clear. No evidence of pleural effusion.  No  mass or lymphadenopathy identified.  Mild thoracic dextroscoliosis is stable.  IMPRESSION: Stable cardiomegaly and probable COPD.  No active disease.   Original Report Authenticated By: Myles Rosenthal, M.D.    Ct Head Wo Contrast  07/28/2012  *RADIOLOGY REPORT*  Clinical Data: 79 year old male with slurred speech.  CT HEAD WITHOUT CONTRAST  Technique:  Contiguous axial images were obtained from the base of the skull through the vertex without contrast.  Comparison: 05/11/2011 and prior studies  Findings: Moderate chronic small vessel white matter ischemic changes are noted.  No acute intracranial abnormalities are identified, including mass lesion or mass effect, hydrocephalus, extra-axial fluid collection, midline shift, hemorrhage, or acute infarction.  The visualized bony calvarium is unremarkable.  IMPRESSION: No evidence of acute intracranial abnormality.  Chronic small vessel white matter ischemic changes.   Original Report Authenticated By: Harmon Pier, M.D.    Mr Laqueta Jean Wo Contrast  07/29/2012  *RADIOLOGY REPORT*  Clinical Data: Altered mental status following orthopedic surgery. History of  stroke.  MRI HEAD WITHOUT AND WITH CONTRAST  Technique:  Multiplanar, multiecho pulse sequences of the brain and surrounding structures were obtained according to standard protocol without and with intravenous contrast  Contrast: 10mL MULTIHANCE GADOBENATE DIMEGLUMINE 529 MG/ML IV SOLN  Comparison: Head CT 07/28/2012.  MRI 04/26/2011.  Findings: Diffusion imaging does not show any acute or subacute infarction.  The brainstem and cerebellum are unremarkable.  The cerebral hemispheres show moderate chronic appearing small vessel changes throughout the hemispheric white matter, slightly advanced since 2012.  No cortical or large vessel territory infarction.  No evidence of obstructive hydrocephalus, hemorrhage, or extra-axial collection.  After contrast administration, no abnormal enhancement occurs.  No pituitary mass.  No inflammatory sinus disease.  No skull or skull base lesion.  IMPRESSION: Chronic small vessel disease throughout the cerebral hemispheric white matter, somewhat progressive since 2012.  No evidence of acute or subacute infarction or other reversible process.   Original Report Authenticated By: Paulina Fusi, M.D.    Dg Knee Right Port  07/29/2012  *RADIOLOGY REPORT*  Clinical Data: 79 year old male status post knee arthroplasty. Swelling and bruising.  Question hematoma.  PORTABLE RIGHT KNEE - 1-2 VIEW  Comparison: 07/10/2012 and postoperative films.  Findings: AP portable cross-table lateral views.  Sequelae of total knee arthroplasty.  Hardware components appear stable and intact. Postoperative drain has been removed.  Postoperative gas has resolved.  There is increase soft tissue density diffusely about the knee, more in the suprapatellar region.  No fracture identified.  IMPRESSION: 1.  Increased soft tissue density about the knee especially the suprapatellar region.  Appearance favors soft tissue hematoma plus/minus moderate joint effusion. 2.  Total knee arthroplasty components appear intact  with stable alignment.   Original Report Authenticated By: Erskine Speed, M.D.    Dg Knee Right Port  07/10/2012  *RADIOLOGY REPORT*  Clinical Data: Right total knee arthroplasty.  PORTABLE RIGHT KNEE - 1-2 VIEW  Comparison: None  Findings: The tibial and femoral components appear well seated.  No complicating features are demonstrated.  IMPRESSION: Well seated components of a total right knee arthroplasty.   Original Report Authenticated By: Rudie Meyer, M.D.     Microbiology: Recent Results (from the past 240 hour(s))  URINE CULTURE     Status: None   Collection Time    07/28/12 12:39 PM      Result Value Range Status   Specimen Description URINE, RANDOM   Final   Special Requests NONE   Final  Culture  Setup Time 07/28/2012 20:15   Final   Colony Count >=100,000 COLONIES/ML   Final   Culture PSEUDOMONAS AERUGINOSA   Final   Report Status 07/30/2012 FINAL   Final   Organism ID, Bacteria PSEUDOMONAS AERUGINOSA   Final     Labs: Basic Metabolic Panel:  Recent Labs Lab 07/28/12 1042 07/29/12 0507 07/30/12 0613 08/01/12 0515  NA 139 139 139 138  K 4.0 4.0 3.8 4.0  CL 102 103 103 102  CO2 29 29 27 28   GLUCOSE 119* 101* 95 103*  BUN 19 18 15 13   CREATININE 0.73 0.64 0.65 0.72  CALCIUM 9.4 8.8 8.7 8.5   Liver Function Tests:  Recent Labs Lab 07/29/12 0507 07/30/12 0613 08/02/12 0630  AST 20 17  --   ALT 22 19  --   ALKPHOS 138* 156*  --   BILITOT 0.3 0.4  --   PROT 6.8 6.6  --   ALBUMIN 2.6* 2.6* 2.5*   No results found for this basename: LIPASE, AMYLASE,  in the last 168 hours No results found for this basename: AMMONIA,  in the last 168 hours CBC:  Recent Labs Lab 07/28/12 1042 07/29/12 0507 08/01/12 0515  WBC 6.3 6.1 5.6  NEUTROABS 5.0  --   --   HGB 11.2* 9.8* 9.6*  HCT 34.8* 30.1* 29.0*  MCV 95.3 93.8 92.4  PLT 478* 448* 363   Cardiac Enzymes: No results found for this basename: CKTOTAL, CKMB, CKMBINDEX, TROPONINI,  in the last 168  hours BNP: BNP (last 3 results) No results found for this basename: PROBNP,  in the last 8760 hours CBG:  Recent Labs Lab 07/29/12 0642 07/30/12 0650 07/31/12 0648 08/01/12 0651 08/02/12 0653  GLUCAP 103* 90 112* 101* 95       Signed:  Rhetta Mura  [facility] 08/02/2012, 11:39 AM

## 2012-08-02 NOTE — Evaluation (Signed)
Occupational Therapy Evaluation Patient Details Name: Jay Walters MRN: [id number] DOB: 1932-02-18 Today's Date: 08/02/2012 Time: 4098-1191 OT Time Calculation (min): 21 min  OT Assessment / Plan / Recommendation Clinical Impression  This 79 y.o. male admitted with AMS due to dilantin toxicity.  Pt. with recent Rt. TKA and was at [facility] for rehab.  Pt. presents to OT withmild confusion, impaired sitting and standing balance, slow to process information as well as decreased independence with BADLs.  Pt. will benefit from OT to maximize safety and independence with BADLs to allow him to return home with wife after SNF level rehab.  Pt. with plan for discharge today and therefore, will defer further OT to SNF.    OT Assessment  All further OT needs can be met in the next venue of care    Follow Up Recommendations  SNF    Barriers to Discharge      Equipment Recommendations  None recommended by OT    Recommendations for Other Services    Frequency       Precautions / Restrictions Precautions Precautions: Fall;Knee Restrictions Weight Bearing Restrictions: No Other Position/Activity Restrictions: WBAT       ADL  Eating/Feeding: Independent Where Assessed - Eating/Feeding: Chair Grooming: Minimal assistance Where Assessed - Grooming: Unsupported sitting Upper Body Bathing: Minimal assistance Where Assessed - Upper Body Bathing: Supported sitting Lower Body Bathing: +2 Total assistance Lower Body Bathing: Patient Percentage: 50% Where Assessed - Lower Body Bathing: Supported sit to stand Upper Body Dressing: Moderate assistance Where Assessed - Upper Body Dressing: Unsupported sitting Lower Body Dressing: +2 Total assistance Lower Body Dressing: Patient Percentage: 40% Where Assessed - Lower Body Dressing: Supported sit to stand Toilet Transfer: +2 Total assistance Toilet Transfer: Patient Percentage: 50% Toilet Transfer Method: Stand pivot Acupuncturist:  Other (comment) Nurse, children's) Toileting - Clothing Manipulation and Hygiene: Simulated;+2 Total assistance Toileting - Clothing Manipulation and Hygiene: Patient Percentage: 10% Where Assessed - Toileting Clothing Manipulation and Hygiene: Sit to stand from 3-in-1 or toilet Tub/Shower Transfer: +1 Total assistance (Unable to perform) Equipment Used: Gait belt;Rolling walker Transfers/Ambulation Related to ADLs: Stand pivot to chair with total A +2 (pt ~50%) ADL Comments: Pt. requires min a to maintain sitting balance - leans to Rt. even when sitting in chair.  While in chair pillow propped on Rt. side to assist with maintaining balance (Pt able to don/doff socks with min A and increased time)    OT Diagnosis: Generalized weakness;Cognitive deficits;Acute pain  OT Problem List: Decreased strength;Decreased activity tolerance;Impaired balance (sitting and/or standing);Decreased cognition;Decreased knowledge of use of DME or AE;Pain OT Treatment Interventions:     OT Goals    Visit Information  Last OT Received On: 08/02/12 Assistance Needed: +2 PT/OT Co-Evaluation/Treatment: Yes    Subjective Data  Subjective: "Why didn't you tell me that I would have to go through all of this before I had my knee done?" Patient Stated Goal: To regain independence   Prior Functioning     Home Living Lives With: Spouse Available Help at Discharge: Family;Skilled Nursing Facility Additional Comments: Pt for SNF placement today Prior Function Level of Independence: Needs assistance Able to Take Stairs?: Yes Vocation: Retired Comments: Pt discharged to SNF post TKA, and required assist with ADLs Communication Communication: No difficulties Dominant Hand: Left         Vision/Perception Vision - History Baseline Vision: Wears glasses all the time   Cognition  Cognition Overall Cognitive Status: Impaired Area of Impairment: Problem solving Arousal/Alertness:  Awake/alert Behavior During  Session: [facility] for tasks performed Safety/Judgement: Decreased awareness of safety precautions;Decreased awareness of need for assistance Awareness of Errors: Assistance required to correct errors made Awareness of Errors - Other Comments: pt will self correct sitting balance at extreme leaning, but still having difficulty achieving midline without therapist support. Cognition - Other Comments: Pt slow to process information.  Appears to have intermittent confusion - see subjective statement.  Pt requires assist to correct errors    Extremity/Trunk Assessment Right Upper Extremity Assessment RUE ROM/Strength/Tone: [facility] for tasks assessed (Pt slow to raise Rt. UE, but strength 5/5) RUE Sensation: WFL - Light Touch RUE Coordination: WFL - gross/fine motor Left Upper Extremity Assessment LUE ROM/Strength/Tone: WFL for tasks assessed LUE Sensation: WFL - Light Touch LUE Coordination: WFL - gross/fine motor     Mobility Bed Mobility Right Sidelying to Sit: 4: Min assist Sitting - Scoot to Edge of Bed: 2: Max assist (falling to left side and unable to correct without therapist) Details for Bed Mobility Assistance: very flexed, rigid posture, difficulty relaxing head/neck/trunk. Transfers Transfers: Sit to Stand;Stand to Sit Sit to Stand: 1: +2 Total assist;With upper extremity assist;From bed Sit to Stand: Patient Percentage: 40% Stand to Sit: 1: +2 Total assist Stand to Sit: Patient Percentage: 40% Details for Transfer Assistance: tactile cues at truck/hip/quads to initiate knee extension. Pt able to initiate contraction, but unable to sustain contraction to prevent knee from buckling during weight bearing for gait.        Balance Balance Balance Assessed: Yes Static Sitting Balance Static Sitting - Balance Support: No upper extremity supported Static Sitting - Level of Assistance: 3: Mod assist Static Sitting - Comment/# of Minutes: mod assist at initial sitting, once midline achieved min  assist at times. Pt mod/max assist during dynamic sitting activities. Dynamic Sitting Balance Dynamic Sitting - Balance Support: Feet supported Dynamic Sitting - Level of Assistance: 4: Min assist Dynamic Sitting Balance - Compensations: Pt leans to Rt.   End of Session OT - End of Session Equipment Utilized During Treatment: Gait belt Activity Tolerance: Patient tolerated treatment well Patient left: in chair;with call bell/phone within reach;with chair alarm set Nurse Communication: Mobility status  GO     Jeani Hawking M 08/02/2012, 12:21 PM

## 2012-08-02 NOTE — Care Management Note (Signed)
    Page 1 of 2   08/02/2012     4:58:16 PM   CARE MANAGEMENT NOTE 08/02/2012  Patient:  Jay Walters, Jay Walters   Account Number:  [id number]  Date Initiated:  07/29/2012  Documentation initiated by:  Jacquelynn Cree  Subjective/Objective Assessment:   admitted with dilantin toxicity     Action/Plan:   PT eval-recommended SNF   Anticipated DC Date:  08/02/2012   Anticipated DC Plan:  SKILLED NURSING FACILITY  In-house referral  Clinical Social Worker      DC Planning Services  CM consult      Choice offered to / List presented to:  C-1 Patient   DME arranged  WHEELCHAIR - MANUAL      DME agency  [facility]     HH arranged  HH-1 RN  HH-2 PT  HH-3 OT  HH-4 NURSE'S AIDE  HH-6 SOCIAL WORKER      HH agency  [facility]   Status of service:  Completed, signed off Medicare Important Message given?   (If response is "NO", the following Medicare IM given date fields will be blank) Date Medicare IM given:   Date Additional Medicare IM given:    Discharge Disposition:  HOME W HOME HEALTH SERVICES  Per UR Regulation:  Reviewed for med. necessity/level of care/duration of stay  If discussed at Long Length of Stay Meetings, dates discussed:    Comments:  08/02/12 Patient and son decided for patient to discharge to home rather than return to SNF. Patient and son chose [facility] for [facility], PT, OT, aide and SW. Dava Najjar at [facility] and set up [facility], PT, OT,  aide and SW. Contacted Darian at [facility] and requested manual wheelchair. Wheelchair should be delivered to patient's room prior to disharge. Jacquelynn Cree RN, BSN, CCM

## 2012-08-02 NOTE — Progress Notes (Signed)
Pt d/c to home by car with family. Assessment stable prescriptions given. Pt and family able to verbalize understanding of d/c instructions.

## 2012-08-02 NOTE — Progress Notes (Signed)
ANTICOAGULATION & DILANTIN CONSULT NOTE - Follow Up Consult  Pharmacy Consult for Coumadin & Dilantin Indication: atrial fibrillation; Seizure disorder  Labs:  Recent Labs  07/31/12 0538 08/01/12 0515 08/02/12 0630  HGB  --  9.6*  --   HCT  --  29.0*  --   PLT  --  363  --   LABPROT 21.9* 23.2* 24.4*  INR 2.00* 2.16* 2.32*  CREATININE  --  0.72  --     Estimated Creatinine Clearance: 58.9 ml/min (by C-G formula based on Cr of 0.72).   Assessment: Jay Walters with atrial fibrillation and history of CVA on chronic Coumadin. INR at 2.32 today-therapeutic.  H/H trending down, but platelets stable- no repeat CBC today. No overt bleeding noted. Cipro stops today. Coumadin home regimen was 5mg  daily except for 7.5mg  on Sun and Fri.  Dilantin remains on hold. Dilantin level today was 20.9 which when corrected for low albumin is ~ 34.8 (goal level 10-20) . Patient's still with bradycardia. BP has improved. No nystagmus reported. MD considering alternative medication. DPH levels ordered daily per MD.   Goal of Therapy:  INR 2-3 Dilantin levels 10-20 mcg/ml once corrected for low albumin  Plan:  - Coumadin 5 mg again today - decreased from home regimen due to Cipro on board. Likely go back to home regimen soon.  - Daily PT/INR  - Continue to Hold phenytoin - Follow-up daily phenytoin level  Link Snuffer, PharmD, BCPS Clinical Pharmacist [phone] 08/02/2012,9:17 AM

## 2012-08-02 NOTE — Progress Notes (Signed)
Physical Therapy Treatment Patient Details Name: Jay Walters MRN: [id number] DOB: 29-Dec-1931 Today's Date: 08/02/2012 Time: 1123-1200 PT Time Calculation (min): 37 min  PT Assessment / Plan / Recommendation Comments on Treatment Session  Pt presents with significant weakness in trunk and has difficulty sitting EOB. Pt leans to right side and posterior. Pt able to achieve midline with multiple tactile facilitation and min/mod assist. Pt presents like a CVA. Pt able to stand with +2 total assist. Pt is able to initiate quad contraction to assist with straightening his leg, but unable to sustain contraction. Pt's knee buckles during weight bearing. Pt is scheduled for D/C back to SNF today for continued therapy.     Follow Up Recommendations  SNF     Does the patient have the potential to tolerate intense rehabilitation     Barriers to Discharge        Equipment Recommendations  None recommended by PT    Recommendations for Other Services    Frequency     Plan Discharge plan remains appropriate    Precautions / Restrictions Precautions Precautions: Fall;Knee Restrictions Weight Bearing Restrictions: No Other Position/Activity Restrictions: WBAT   Pertinent Vitals/Pain     Mobility  Bed Mobility Right Sidelying to Sit: 4: Min assist Sitting - Scoot to Edge of Bed: 2: Max assist (falling to left side and unable to correct without therapist) Details for Bed Mobility Assistance: very flexed, rigid posture, difficulty relaxing head/neck/trunk. Transfers Transfers: Stand to Sit Sit to Stand: 1: +2 Total assist;With upper extremity assist;From bed Sit to Stand: Patient Percentage: 40% Stand to Sit: 1: +2 Total assist Stand to Sit: Patient Percentage: 40% Stand Pivot Transfers: 1: +2 Total assist Stand Pivot Transfers: Patient Percentage: 40% Details for Transfer Assistance: tactile cues at truck/hip/quads to initiate knee extension. Pt able to initiate contraction, but unable to  sustain contraction to prevent knee from buckling during weight bearing for gait.    Exercises Total Joint Exercises Quad Sets: AROM;Strengthening;Right;15 reps;Supine Hip ABduction/ADduction: AAROM;Strengthening;Right;15 reps;Supine Straight Leg Raises: AAROM;Strengthening;Right;15 reps;Supine Long Arc Quad: AAROM;Strengthening;Right;15 reps;Seated Knee Flexion: AAROM;Strengthening;Right;15 reps;Seated Goniometric ROM: -5-80 degrees   PT Diagnosis:    PT Problem List:   PT Treatment Interventions:     PT Goals Acute Rehab PT Goals PT Goal: Supine/Side to Sit - Progress: Not met PT Goal: Sit to Stand - Progress: Progressing toward goal PT Goal: Stand to Sit - Progress: Progressing toward goal PT Transfer Goal: Bed to Chair/Chair to Bed - Progress: Progressing toward goal PT Goal: Ambulate - Progress: Progressing toward goal PT Goal: Up/Down Stairs - Progress: Discontinued (comment) (unable to maintain knee extension at this time)  Visit Information  Last PT Received On: 08/02/12 Assistance Needed: +2 PT/OT Co-Evaluation/Treatment: Yes    Subjective Data  Subjective: I wish they would have told me it would be this much work.   Cognition  Cognition Overall Cognitive Status: Impaired Area of Impairment: Problem solving Arousal/Alertness: Awake/alert Behavior During Session: WFL for tasks performed Safety/Judgement: Decreased awareness of safety precautions;Decreased awareness of need for assistance Awareness of Errors: Assistance required to correct errors made Awareness of Errors - Other Comments: pt will self correct sitting balance at extreme leaning, but still having difficulty achieving midline without therapist support. Cognition - Other Comments: Pt slow to process information.  Appears to have intermittent confusion - see subjective statement.  Pt requires assist to correct errors    Balance  Balance Balance Assessed: Yes Static Sitting Balance Static Sitting -  Balance Support: No  upper extremity supported Static Sitting - Level of Assistance: 3: Mod assist Static Sitting - Comment/# of Minutes: mod assist at initial sitting, once midline achieved min assist at times. Pt mod/max assist during dynamic sitting activities. Dynamic Sitting Balance Dynamic Sitting - Balance Support: Feet supported Dynamic Sitting - Level of Assistance: 4: Min assist Dynamic Sitting Balance - Compensations: Pt leans to Rt.  End of Session PT - End of Session Equipment Utilized During Treatment: Gait belt Activity Tolerance: Patient tolerated treatment well Patient left: in chair;with call bell/phone within reach;with chair alarm set Nurse Communication: Mobility status   GP     Greggory Stallion 08/02/2012, 12:23 PM

## 2012-08-02 NOTE — Telephone Encounter (Signed)
Spoke with patient and wife. He is being discharged to a skilled facility for more PT. Dilantin level is still high according to hospital records and is being held. Level is to repeated in a few days.

## 2012-08-02 NOTE — Clinical Social Work Note (Signed)
Clinical Social Work   Pt is ready for discharge to home with home health services. Pt declined SNF. Pt will have support at home and plans to go to SNF next week. CSW updated MD and RNCM. CSW signing off as no further needs identified.   Dede Query, MSW, Theresia Majors [phone]

## 2012-08-06 ENCOUNTER — Telehealth: Payer: Self-pay | Admitting: Internal Medicine

## 2012-08-06 NOTE — Telephone Encounter (Signed)
Spoke with Hospital doctor at [facility] to advise Dr. Lenord Fellers did not order this.  She will contact his orthopedic doc to provide results.

## 2012-08-06 NOTE — Telephone Encounter (Signed)
This should be called to nursing home not me- I did not order

## 2012-08-08 ENCOUNTER — Telehealth: Payer: Self-pay

## 2012-08-08 DIAGNOSIS — Z7901 Long term (current) use of anticoagulants: Secondary | ICD-10-CM

## 2012-08-08 NOTE — Telephone Encounter (Signed)
Patient had his knee replacement surgery on 07/10/2012 per Dr. Darrelyn Hillock. Latest pt/inr was 24.3/2.3. Patient is ambulatory in his home. Per Dr. Lenord Fellers, may stop Coumadin on 08/10/2012. Patient voices verbal understanding

## 2012-08-09 ENCOUNTER — Other Ambulatory Visit: Payer: 59 | Admitting: Internal Medicine

## 2012-08-09 DIAGNOSIS — G40909 Epilepsy, unspecified, not intractable, without status epilepticus: Secondary | ICD-10-CM

## 2012-08-10 ENCOUNTER — Telehealth: Payer: Self-pay | Admitting: Internal Medicine

## 2012-08-10 DIAGNOSIS — M5000 Cervical disc disorder with myelopathy, unspecified cervical region: Secondary | ICD-10-CM | POA: Insufficient documentation

## 2012-08-10 DIAGNOSIS — R569 Unspecified convulsions: Secondary | ICD-10-CM

## 2012-08-10 DIAGNOSIS — G40802 Other epilepsy, not intractable, without status epilepticus: Secondary | ICD-10-CM

## 2012-08-10 LAB — PHENYTOIN LEVEL, TOTAL: Phenytoin Lvl: 1.5 ug/mL — ABNORMAL LOW (ref 10.0–20.0)

## 2012-08-10 MED ORDER — PHENYTOIN SODIUM EXTENDED 100 MG PO CAPS: 300.0000 mg | ORAL_CAPSULE | Freq: Every day | ORAL | Status: DC

## 2012-08-10 NOTE — Telephone Encounter (Signed)
Review chart. Patient has been on chronic Coumadin therapy for over a year do to paroxysmal atrial fibrillation and history of atrial flutter. Initially it was thought that he was on Coumadin just because status post knee replacement. Patient called and advised to continue Coumadin therapy. Needs appointment to be seen  March 7. At that time will need Dilantin level. Dose is been increased to 300 mg at bedtime. Will need protime, CBC and urinalysis for followup of recent UTI treated with Cipro while in the hospital.
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
## 2012-08-10 NOTE — Patient Instructions (Addendum)
Influenza immunization given. Return in 6 months for physical exam. Orthopedic referral regarding knee pain

## 2012-08-10 NOTE — Telephone Encounter (Signed)
Dilantin level low at 1.5. Has been taking 100 mg of Dilantin at bedtime. Previous dose was 300 mg at bedtime before going to nursing home. Patient is to resume 300 mg at bedtime. Left message on his personal cell phone. Repeat Dilantin level in one week.

## 2012-08-13 NOTE — Telephone Encounter (Signed)
Patient informed. Will come on Friday 08/16/2012 at 10:00am

## 2012-08-15 DIAGNOSIS — Z96659 Presence of unspecified artificial knee joint: Secondary | ICD-10-CM

## 2012-08-19 ENCOUNTER — Telehealth: Payer: Self-pay | Admitting: Internal Medicine

## 2012-08-19 ENCOUNTER — Other Ambulatory Visit: Payer: Self-pay | Admitting: Internal Medicine

## 2012-08-19 ENCOUNTER — Ambulatory Visit (INDEPENDENT_AMBULATORY_CARE_PROVIDER_SITE_OTHER): Payer: 59 | Admitting: Internal Medicine

## 2012-08-19 ENCOUNTER — Encounter: Payer: Self-pay | Admitting: Internal Medicine

## 2012-08-19 VITALS — BP 110/56 | HR 68 | Temp 97.9°F | Wt 122.5 lb

## 2012-08-19 DIAGNOSIS — Z5189 Encounter for other specified aftercare: Secondary | ICD-10-CM

## 2012-08-19 DIAGNOSIS — G40909 Epilepsy, unspecified, not intractable, without status epilepticus: Secondary | ICD-10-CM

## 2012-08-19 DIAGNOSIS — Z7901 Long term (current) use of anticoagulants: Secondary | ICD-10-CM | POA: Diagnosis not present

## 2012-08-19 DIAGNOSIS — Z96659 Presence of unspecified artificial knee joint: Secondary | ICD-10-CM

## 2012-08-19 DIAGNOSIS — N390 Urinary tract infection, site not specified: Secondary | ICD-10-CM

## 2012-08-19 DIAGNOSIS — Z79899 Other long term (current) drug therapy: Secondary | ICD-10-CM

## 2012-08-19 DIAGNOSIS — R351 Nocturia: Secondary | ICD-10-CM

## 2012-08-19 DIAGNOSIS — N400 Enlarged prostate without lower urinary tract symptoms: Secondary | ICD-10-CM

## 2012-08-19 DIAGNOSIS — R300 Dysuria: Secondary | ICD-10-CM | POA: Diagnosis not present

## 2012-08-19 DIAGNOSIS — R82998 Other abnormal findings in urine: Secondary | ICD-10-CM | POA: Diagnosis not present

## 2012-08-19 DIAGNOSIS — Z96651 Presence of right artificial knee joint: Secondary | ICD-10-CM

## 2012-08-19 LAB — CBC WITH DIFFERENTIAL/PLATELET
Eosinophils Absolute: 0 10*3/uL (ref 0.0–0.7)
Eosinophils Relative: 0 % (ref 0–5)
Hemoglobin: 10.9 g/dL — ABNORMAL LOW (ref 13.0–17.0)
Lymphocytes Relative: 13 % (ref 12–46)
Lymphs Abs: 1.1 10*3/uL (ref 0.7–4.0)
MCH: 29.9 pg (ref 26.0–34.0)
MCV: 90.9 fL (ref 78.0–100.0)
Monocytes Relative: 7 % (ref 3–12)
Neutrophils Relative %: 79 % — ABNORMAL HIGH (ref 43–77)
RBC: 3.64 MIL/uL — ABNORMAL LOW (ref 4.22–5.81)
WBC: 8.9 10*3/uL (ref 4.0–10.5)

## 2012-08-19 LAB — COMPREHENSIVE METABOLIC PANEL
ALT: 25 U/L (ref 0–53)
Albumin: 3.1 g/dL — ABNORMAL LOW (ref 3.5–5.2)
CO2: 28 mEq/L (ref 19–32)
Calcium: 9.3 mg/dL (ref 8.4–10.5)
Chloride: 104 mEq/L (ref 96–112)
Glucose, Bld: 128 mg/dL — ABNORMAL HIGH (ref 70–99)
Sodium: 141 mEq/L (ref 135–145)
Total Bilirubin: 0.4 mg/dL (ref 0.3–1.2)
Total Protein: 6.5 g/dL (ref 6.0–8.3)

## 2012-08-19 LAB — PROTIME-INR
INR: 2.04 — ABNORMAL HIGH (ref <1.50)
Prothrombin Time: 21.7 seconds — ABNORMAL HIGH (ref 11.6–15.2)

## 2012-08-19 MED ORDER — DOXAZOSIN MESYLATE 4 MG PO TABS: 4.0000 mg | ORAL_TABLET | Freq: Every evening | ORAL | Status: DC

## 2012-08-19 MED ORDER — DIGOXIN 125 MCG PO TABS: 0.0625 mg | ORAL_TABLET | ORAL | Status: DC

## 2012-08-19 NOTE — Progress Notes (Signed)
  Subjective:    Patient ID: Jay Walters, male    DOB: 10-Sep-1931, 79 y.o.   MRN: [id number]  HPI 79 year old black male had right knee replacement by Dr. Darrelyn Hillock January 29. He subsequently went to [facility] for rehabilitation. While there, he became Dilantin toxic. He was admitted to the hospital on February 14 with mental status changes and discharge February 21. While there he was diagnosed with a Pseudomonas urinary tract infection that was sensitive to Cipro. He is here today for followup. He remains on chronic Coumadin therapy for history of atrial fib/flutter. Dr. Alanda Amass usually follows that. He has lost about 9 pounds during the past several weeks. Appetite is decreased. He is ambulating with a walker. He is receiving home physical therapy and home health services. Recent Dilantin level was low and we advised him to increase to 300 mg nightly which is his usual dose but apparently he did not get the message so he is still on 100 mg daily. He's also had a lot of urinary frequency at night. He is on Cardura for that. Also takes Proscar for BPH. Hasn't seen neurologist in some time. Urinalysis today is highly abnormal. He has seen some blood in his urine on occasion. Culture will be done again today. Started him on Cipro 250 mg twice daily for 7 days with plans to followup with him again in one week. He is going to need some close followup for while. He has a history of seizure disorder which is long-standing. Son is with him today who understands his medication list. We will also check him for diabetes mellitus with history of urinary frequency. CBC will be drawn. Says he feels a bit weak. Will drink some Ensure.    Review of Systems     Objective:   Physical Exam Affect is normal. Alert and oriented. Chest clear. Cardiac exam regular rate and rhythm.       Assessment & Plan:  Status post admission for Dilantin toxicity  History of seizure disorder  History of atrial fib/flutter  on chronic Coumadin therapy  Status post right knee replacement January 29  Pseudomonas UTI-urinalysis today is abnormal. Culture pending. Start Cipro 250 mg twice daily for 7 days.  Plan: Return in one week for followup. Hemoglobin A1c drawn as well as CBC with differential, complete metabolic panel. Urine culture pending. Refill oxycodone APAP 10/325 #60 one by mouth every 8 hours when necessary pain. Continue Cardura for urinary frequency. Continue Proscar for BPH. Refill Dilantin 100 mg proceed( #90) 3 capsules by mouth at bedtime  Time spent with patient 25 minutes. Home health forms signed on 08/16/2012.

## 2012-08-19 NOTE — Patient Instructions (Addendum)
Continue oxycodone APAP sparingly for pain. Try to cut back on in assuring just drank one a day. Hopefully appetite will improve. Continue home health PT. Return in one week. Take Cipro for urinary tract infection. Increase Dilantin to 300 capsules at bedtime.

## 2012-08-19 NOTE — Telephone Encounter (Signed)
Son requesting refills on Cardura and digoxin for patient. Done by e- scribe today.

## 2012-08-20 LAB — HEMOGLOBIN A1C: Hgb A1c MFr Bld: 6 % — ABNORMAL HIGH (ref <5.7)

## 2012-08-20 LAB — PHENYTOIN LEVEL, TOTAL: Phenytoin Lvl: 1.3 ug/mL — ABNORMAL LOW (ref 10.0–20.0)

## 2012-08-26 ENCOUNTER — Ambulatory Visit: Payer: 59 | Admitting: Internal Medicine

## 2012-08-26 DIAGNOSIS — G40909 Epilepsy, unspecified, not intractable, without status epilepticus: Secondary | ICD-10-CM

## 2012-08-27 NOTE — Progress Notes (Signed)
Patient informed. Have scheduled him for an appointment next Tuesday 09/03/2012

## 2012-08-31 ENCOUNTER — Encounter: Payer: Self-pay | Admitting: Internal Medicine

## 2012-08-31 NOTE — Patient Instructions (Addendum)
Take Dilantin 300 mg daily. Finish course of Cipro. Return in one week.

## 2012-08-31 NOTE — Progress Notes (Signed)
  Subjective:    Patient ID: Jay Walters, male    DOB: [birth year], 79 y.o.   MRN: [id number]  HPI patient in today for Dilantin level to be drawn. He has increased his dose of Dilantin from 100 mg to 300 mg daily. Dilantin level drawn today. He was recently diagnosed with a Pseudomonas UTI in the hospital and also here last week. He has not finished 10 day course of Cipro. He will need to return next week for followup urinalysis. Dilantin level remains low and subtherapeutic at 7.3. Wonder if he missed a dose or so. However much better than a week ago when level was 1.3 on  100 mg daily    Review of Systems     Objective:   Physical Exam Not examined      Assessment & Plan:  Plan return in one week for office visit, Dilantin level and repeat urinalysis status post treatment for Pseudomonas urinary tract infection. Reminded current dose of Dilantin should be 300 mg daily.

## 2012-09-03 ENCOUNTER — Encounter: Payer: Self-pay | Admitting: Internal Medicine

## 2012-09-03 ENCOUNTER — Ambulatory Visit (INDEPENDENT_AMBULATORY_CARE_PROVIDER_SITE_OTHER): Payer: 59 | Admitting: Internal Medicine

## 2012-09-03 ENCOUNTER — Ambulatory Visit: Payer: 59 | Admitting: Internal Medicine

## 2012-09-03 VITALS — BP 110/64 | HR 60 | Temp 98.1°F | Wt 124.0 lb

## 2012-09-03 DIAGNOSIS — R569 Unspecified convulsions: Secondary | ICD-10-CM

## 2012-09-03 DIAGNOSIS — N390 Urinary tract infection, site not specified: Secondary | ICD-10-CM

## 2012-09-03 LAB — POCT URINALYSIS DIPSTICK
Bilirubin, UA: NEGATIVE
Glucose, UA: NEGATIVE
Ketones, UA: NEGATIVE
Spec Grav, UA: >=1.03
pH, UA: 5.5

## 2012-09-03 NOTE — Progress Notes (Signed)
  Subjective:    Patient ID: Jay Walters, male    DOB: 18-Mar-1932, 79 y.o.   MRN: [id number]  HPI Rturns today to followup also Pseudomonas  urinary tract infection. Unfortunately his son was giving him 2x500 mg Cipro tablets every morning not splitting them up twice a day as directed. He thought he was initially getting better but subsequently returned to 5 times a night of nocturia. Urinalysis is abnormal today. Recent urine culture showed organism to be sensitive to Cipro. Says he's now taking regularly 300 mg of generic Dilantin daily. However still concerned about urinary frequency. Has been on Cardura.  He has gained a little bit of weight.   Review of Systems     Objective:   Physical Exam chest clear to auscultation. Cardiac exam regular rate and rhythm. Extremities without edema. He is thin and frail         Assessment & Plan:   BPH  Nocturia  Pseudomonas urinary tract infection  History of Dilantin toxicity while in assisted living facility now trying to regulate Dilantin level back to normal on 300 mg daily  Plan: Discontinue Cardura. Try Flomax 0.4 mg daily. Retreat Pseudomonas urinary tract infection with Cipro 500 mg daily for 2 weeks. Return in 2 weeks. He also has a mild anemia related to blood loss from knee replacement. This will need to be followed up at next visit.

## 2012-09-03 NOTE — Patient Instructions (Addendum)
Take Cipro 500 mg twice daily for 2 weeks for urinary tract infection. Take Flomax instead of Cardura daily. Continue Dilantin hysterectomy. Return in 2 weeks.

## 2012-09-04 LAB — PHENYTOIN LEVEL, TOTAL: Phenytoin Lvl: 4.9 ug/mL — ABNORMAL LOW (ref 10.0–20.0)

## 2012-09-06 LAB — URINE CULTURE

## 2012-09-09 ENCOUNTER — Telehealth: Payer: Self-pay | Admitting: Internal Medicine

## 2012-09-09 ENCOUNTER — Telehealth: Payer: Self-pay

## 2012-09-09 DIAGNOSIS — N390 Urinary tract infection, site not specified: Secondary | ICD-10-CM

## 2012-09-09 DIAGNOSIS — B965 Pseudomonas (aeruginosa) (mallei) (pseudomallei) as the cause of diseases classified elsewhere: Secondary | ICD-10-CM

## 2012-09-09 MED ORDER — OXYCODONE-ACETAMINOPHEN 5-325 MG PO TABS: 1.0000 | ORAL_TABLET | Freq: Two times a day (BID) | ORAL | Status: DC | PRN

## 2012-09-09 NOTE — Telephone Encounter (Signed)
Jay Walters calls to request a refill on Jay Walters's Oxycodone 5/325mg  1 q8h prn . States you gave him the last rx first week in March.

## 2012-09-09 NOTE — Telephone Encounter (Signed)
Printed as directed

## 2012-09-09 NOTE — Progress Notes (Signed)
Patient informed. Order sent to [facility]

## 2012-09-09 NOTE — Telephone Encounter (Signed)
Please give #60 with no refill.

## 2012-09-09 NOTE — Telephone Encounter (Signed)
Attempted to call patient at home and phone was not working. Attempted to call wife at work and did not receive her personally but only for a boy's male. Attempted to call son got voicemail. We sent a Dorene Ar out to the home to start IV tobramycin for Pseudomonas urinary tract infection that is resistant to oral antibiotics including Cipro. Mr. Dupin refused to be treated saying no one contacted him. My nurse had contacted Davin his son earlier in the day and explained all of this to him. Patient said he was afraid insurance wouldn't pay for it and didn't understand why Cipro would work. We will try to have a nurse come back tomorrow and start the treatment. We have asked that they give [place] a working phone number for a primary contact.

## 2012-09-12 ENCOUNTER — Telehealth: Payer: Self-pay | Admitting: Internal Medicine

## 2012-09-12 NOTE — Telephone Encounter (Signed)
Results received from [facility] regarding CBC, BUN, creatinine. Patient now on IV tobramycin for Cipro resistant Pseudomonas urinary tract infection. White blood cell count today 2600. Previous white blood cell count 2 weeks ago  Around 8000. BUN and creatinine are within normal limits. Tobramycin peak and trough are pending. Suggest CBC be repeated with next tobramycin peak and trough.

## 2012-09-13 ENCOUNTER — Telehealth: Payer: Self-pay

## 2012-09-13 NOTE — Telephone Encounter (Signed)
Patient's wife calls to report that Jay Walters feels that ever since starting the IV antibiotics, his equilibrium if off, he is stumbling, hip and leg are hurting and he is needing to use the walker more now. Feels he has no control.

## 2012-09-13 NOTE — Telephone Encounter (Signed)
Tobramycin peaks and troughs are being monitored. Continue IV Tobramycin and call with progress report Monday. Be careful ambulating.

## 2012-09-13 NOTE — Telephone Encounter (Signed)
Patient informed. Will call back on Monday.

## 2012-09-17 ENCOUNTER — Telehealth: Payer: Self-pay

## 2012-09-17 NOTE — Telephone Encounter (Signed)
Rx for the Oxycodone was never picked up by Jay Walters . Informed Jay Walters of this, and she will pick it up today.

## 2012-09-17 NOTE — Telephone Encounter (Signed)
She is calling to request a refill on Jay Walters's Oxycodone 5/325mg , however you gave him a rx for #60 on 09/09/2012 to take one bid. He should not be out yet.

## 2012-09-17 NOTE — Telephone Encounter (Signed)
Please call pharmacy and see if that Rx was filled on 3/31. If so, you will need to contact Jay Walters.

## 2012-09-19 ENCOUNTER — Telehealth: Payer: Self-pay

## 2012-09-19 NOTE — Telephone Encounter (Signed)
Today is the last day of the Tobramycin IV. Inquiring if you want any other labs drawn. States he is still very weak and thinks he would benefit from PT/OT consult. He has a 2 week followup appt on Monday 09/23/2012.

## 2012-09-19 NOTE — Telephone Encounter (Signed)
Will see him on Monday and make further recommendations.

## 2012-09-20 NOTE — Telephone Encounter (Signed)
Home health nurse also wanted Dr. Lenord Fellers to know that patient had some infiltration at site of IV when it was discontinued. They advised patient to use warm compresses on it.

## 2012-09-23 ENCOUNTER — Ambulatory Visit (INDEPENDENT_AMBULATORY_CARE_PROVIDER_SITE_OTHER): Payer: 59 | Admitting: Internal Medicine

## 2012-09-23 ENCOUNTER — Encounter: Payer: Self-pay | Admitting: Internal Medicine

## 2012-09-23 VITALS — BP 100/56 | HR 52 | Temp 97.1°F | Wt 123.5 lb

## 2012-09-23 DIAGNOSIS — D649 Anemia, unspecified: Secondary | ICD-10-CM

## 2012-09-23 DIAGNOSIS — N390 Urinary tract infection, site not specified: Secondary | ICD-10-CM

## 2012-09-23 DIAGNOSIS — G40909 Epilepsy, unspecified, not intractable, without status epilepticus: Secondary | ICD-10-CM

## 2012-09-23 DIAGNOSIS — Z7901 Long term (current) use of anticoagulants: Secondary | ICD-10-CM

## 2012-09-23 DIAGNOSIS — Z96659 Presence of unspecified artificial knee joint: Secondary | ICD-10-CM

## 2012-09-23 DIAGNOSIS — Z96651 Presence of right artificial knee joint: Secondary | ICD-10-CM

## 2012-09-23 LAB — IRON AND TIBC: UIBC: 152 ug/dL (ref 125–400)

## 2012-09-23 LAB — POCT URINALYSIS DIPSTICK
Ketones, UA: NEGATIVE
Leukocytes, UA: NEGATIVE
Protein, UA: NEGATIVE
pH, UA: 6

## 2012-09-23 LAB — CBC WITH DIFFERENTIAL/PLATELET
Basophils Absolute: 0.1 10*3/uL (ref 0.0–0.1)
Basophils Relative: 3 % — ABNORMAL HIGH (ref 0–1)
Eosinophils Absolute: 0 10*3/uL (ref 0.0–0.7)
Eosinophils Relative: 1 % (ref 0–5)
HCT: 38.6 % — ABNORMAL LOW (ref 39.0–52.0)
MCH: 29.1 pg (ref 26.0–34.0)
MCHC: 32.1 g/dL (ref 30.0–36.0)
Monocytes Absolute: 0.2 10*3/uL (ref 0.1–1.0)
Neutro Abs: 1.1 10*3/uL — ABNORMAL LOW (ref 1.7–7.7)
RDW: 14.8 % (ref 11.5–15.5)

## 2012-09-23 LAB — FOLATE: Folate: 12.1 ng/mL

## 2012-09-23 LAB — VITAMIN B12: Vitamin B-12: 506 pg/mL (ref 211–911)

## 2012-09-23 NOTE — Progress Notes (Signed)
  Subjective:    Patient ID: Jay Walters, male    DOB: March 13, 1932, 79 y.o.   MRN: [id number]  HPI 79 year old Black male who had right knee replacement surgery by Dr. Darrelyn Hillock in January 2014. Subsequently went to skilled nursing facility for rehabilitation and became Dilantin toxic. He has a long-standing history of seizure disorder and has been maintained on Dilantin for many years per neurologist. He was hospitalized for Dilantin toxicity. Since that time, we have been trying to get his Dilantin level back to within normal limits. Says he's taking 300 mg daily. Confirm this with his son who is present today. Dilantin level repeated today. Last visit he was actually lower than the previous visit. Can't seem to get it up to therapeutic level between 10 and 20. Is also on Coumadin for history of atrial dysrhythmia. This is being managed by cardiologist. He had a recent Pseudomonas urinary tract infection that was resistant to oral medication. He was treated with IV tobramycin. Urinalysis today is normal. Appetite is fair. The son says he eats better for lunch and dinner then at breakfast. Previous weight in November 2013 was 131.5  pounds. He's had some generalized weakness recently. His balance is a bit off. He would like to get some more physical therapy at home. He did have physical therapy through facility [facility] which ended recently. He has a history of cervical myelopathy resulting in hospitalization November 2012. Patient says he's had some left-sided weakness since that time.   Review of Systems     Objective:   Physical Exam Chest is clear to auscultation.  Cardiac exam regular rate and rhythm . Extremities without edema. He is alert and oriented. No facial asymmetry. Muscle strength in the upper extremities is within normal limits. Right knee is warm to touch but not red. No drainage noted from the incision. Right knee is slightly swollen. Left knee within normal limits. No lower  extremity edema. Muscle strength in the lower extremities is 5 over 5 in all great tested. When he stands to angulate with a cane he has some slight ataxia        Assessment & Plan:   Pseudomonas urinary tract infection requiring IV tobramycin-finally resolved. Urinalysis today is normal.  History of Dilantin toxicity and skilled nursing facility status post knee replacement surgery-Dilantin level drawn.  History of atrial dysrhythmia treated with Coumadin  Status post right knee replacement surgery-slow recovery  Plan: He still looks debilitated. He is having some issues with appetite. We'll plan to followup with him again in 2 weeks

## 2012-09-24 NOTE — Progress Notes (Signed)
Patient informed. 

## 2012-09-28 ENCOUNTER — Encounter: Payer: Self-pay | Admitting: Pharmacist Clinician (PhC)/ Clinical Pharmacy Specialist

## 2012-09-28 DIAGNOSIS — I4892 Unspecified atrial flutter: Secondary | ICD-10-CM

## 2012-09-28 DIAGNOSIS — Z7901 Long term (current) use of anticoagulants: Secondary | ICD-10-CM | POA: Insufficient documentation

## 2012-09-30 ENCOUNTER — Telehealth: Payer: Self-pay | Admitting: Internal Medicine

## 2012-09-30 NOTE — Telephone Encounter (Signed)
This seems to me to be an orthopedic issue. We will note in chart advise calling his orthopedist.

## 2012-09-30 NOTE — Telephone Encounter (Signed)
He cannot put much weight on the knee at all.  She is also reporting this to his ortho doc, Dr. Darrelyn Hillock.  States this is part of their protocol that they must notify the PCP.  Is there anything that we need to advise or will you look to the Ortho doc to follow this injury?  Please advise.

## 2012-10-07 ENCOUNTER — Other Ambulatory Visit: Payer: 59 | Admitting: Internal Medicine

## 2012-10-07 ENCOUNTER — Encounter: Payer: Self-pay | Admitting: Internal Medicine

## 2012-10-07 ENCOUNTER — Ambulatory Visit (INDEPENDENT_AMBULATORY_CARE_PROVIDER_SITE_OTHER): Payer: 59 | Admitting: Internal Medicine

## 2012-10-07 VITALS — BP 136/72 | HR 70 | Temp 97.6°F | Wt 125.0 lb

## 2012-10-07 DIAGNOSIS — Z96651 Presence of right artificial knee joint: Secondary | ICD-10-CM

## 2012-10-07 DIAGNOSIS — N390 Urinary tract infection, site not specified: Secondary | ICD-10-CM

## 2012-10-07 DIAGNOSIS — T148XXA Other injury of unspecified body region, initial encounter: Secondary | ICD-10-CM

## 2012-10-07 DIAGNOSIS — Z8669 Personal history of other diseases of the nervous system and sense organs: Secondary | ICD-10-CM

## 2012-10-07 DIAGNOSIS — R569 Unspecified convulsions: Secondary | ICD-10-CM

## 2012-10-07 DIAGNOSIS — Z8679 Personal history of other diseases of the circulatory system: Secondary | ICD-10-CM

## 2012-10-07 DIAGNOSIS — Z96659 Presence of unspecified artificial knee joint: Secondary | ICD-10-CM

## 2012-10-07 NOTE — Patient Instructions (Addendum)
Return in 4 weeks. Continue same medications. Continue with physical therapy per orthopedist.

## 2012-10-07 NOTE — Progress Notes (Signed)
  Subjective:    Patient ID: Jay Walters, male    DOB: 07/02/1931, 79 y.o.   MRN: [id number]  HPI Followup today on recent Pseudomonas urinary tract infection, Dilantin level, rehabilitation from right knee replacement. Apparently patient suffered a fall on April 12 at his home. He was at the top of some stairs and lost his balance falling over injuring his knee. He has seen Dr. Darrelyn Hillock  recently and was told he had a small fracture. We have received a report from orthopedist indicating he has a small avulsion fracture of his patella. He is supposed to be wearing a right knee brace but is not wearing it today. Son says he does not like wearing it. His knee appears to be buckling and turning inward. He is ambulating with a walker. He was not able to urinate today. He will bring specimen back from home. Dilantin level drawn today. Son reports that he is eating much better. He's been receiving some physical therapy at home. Still taking some oxycodone for pain at night. He is on Coumadin therapy for history of atrial flutter. Patient seems slightly depressed at like a progress.    Review of Systems     Objective:   Physical Exam right knee is swollen and slightly warm to touch but not red. Chest is clear to auscultation. Cardiac exam regular rate and rhythm.        Assessment & Plan:  Slow recuperation from right knee replacement surgery complicated by fall at home resulting in avulsion fracture of patella  History of Dilantin toxicity while at rehabilitation facility. Dilantin level done at last visit male within normal limits  History of Pseudomonas urinary tract infection requiring IV tobramycin through home care  Plan: Return in one month. Dilantin level drawn today.

## 2012-10-08 ENCOUNTER — Encounter: Payer: 59 | Admitting: Internal Medicine

## 2012-10-08 LAB — POCT URINALYSIS DIPSTICK
Blood, UA: NEGATIVE
Nitrite, UA: NEGATIVE
Protein, UA: NEGATIVE
Spec Grav, UA: 1.02
Urobilinogen, UA: NEGATIVE
pH, UA: 5.5

## 2012-10-08 LAB — PHENYTOIN LEVEL, TOTAL: Phenytoin Lvl: 12.3 ug/mL (ref 10.0–20.0)

## 2012-10-23 ENCOUNTER — Other Ambulatory Visit: Payer: Self-pay

## 2012-10-23 MED ORDER — TAMSULOSIN HCL 0.4 MG PO CAPS: 0.4000 mg | ORAL_CAPSULE | Freq: Every day | ORAL | Status: DC

## 2012-10-23 MED ORDER — FINASTERIDE 5 MG PO TABS: 5.0000 mg | ORAL_TABLET | Freq: Every evening | ORAL | Status: DC

## 2012-10-23 MED ORDER — PHENYTOIN SODIUM EXTENDED 100 MG PO CAPS: 300.0000 mg | ORAL_CAPSULE | Freq: Every day | ORAL | Status: DC

## 2012-10-23 MED ORDER — DOXAZOSIN MESYLATE 4 MG PO TABS: 4.0000 mg | ORAL_TABLET | Freq: Every day | ORAL | Status: DC

## 2012-10-23 MED ORDER — DIGOXIN 125 MCG PO TABS: 0.0625 mg | ORAL_TABLET | ORAL | Status: DC

## 2012-10-28 ENCOUNTER — Other Ambulatory Visit: Payer: Self-pay

## 2012-10-28 MED ORDER — FINASTERIDE 5 MG PO TABS: 5.0000 mg | ORAL_TABLET | Freq: Every evening | ORAL | Status: DC

## 2012-10-28 MED ORDER — DIGOXIN 125 MCG PO TABS: 0.0625 mg | ORAL_TABLET | ORAL | Status: DC

## 2012-10-28 MED ORDER — DOXAZOSIN MESYLATE 4 MG PO TABS: 4.0000 mg | ORAL_TABLET | Freq: Every day | ORAL | Status: DC

## 2012-10-28 MED ORDER — PHENYTOIN SODIUM EXTENDED 100 MG PO CAPS: 300.0000 mg | ORAL_CAPSULE | Freq: Every day | ORAL | Status: DC

## 2012-10-28 MED ORDER — TAMSULOSIN HCL 0.4 MG PO CAPS: 0.4000 mg | ORAL_CAPSULE | Freq: Every day | ORAL | Status: DC

## 2012-10-29 ENCOUNTER — Telehealth: Payer: Self-pay

## 2012-10-29 NOTE — Telephone Encounter (Signed)
Having a lot of joint pain lately; hips shoulders, neck and tailbone. Keeps him awake at night. Wonders if he should see a rheumatologist.

## 2012-10-30 NOTE — Telephone Encounter (Signed)
It is likely osteoarthritis and inactivity rather than a Rheumatic condition. Suggest he have Sed rate, ANA, CCP and Rheumatoid factor drawn before we pursue a consultation. Because of Coumadin, it would not be a good idea to take an  NSAID medication.

## 2012-10-30 NOTE — Telephone Encounter (Signed)
Patient informed. Will do labs at next visit.

## 2012-11-05 ENCOUNTER — Ambulatory Visit: Payer: Self-pay | Admitting: Internal Medicine

## 2012-11-08 ENCOUNTER — Other Ambulatory Visit (HOSPITAL_COMMUNITY): Payer: Self-pay | Admitting: Cardiovascular Disease

## 2012-11-08 ENCOUNTER — Ambulatory Visit (INDEPENDENT_AMBULATORY_CARE_PROVIDER_SITE_OTHER): Payer: 59 | Admitting: Pharmacist Clinician (PhC)/ Clinical Pharmacy Specialist

## 2012-11-08 DIAGNOSIS — I480 Paroxysmal atrial fibrillation: Secondary | ICD-10-CM

## 2012-11-08 DIAGNOSIS — I495 Sick sinus syndrome: Secondary | ICD-10-CM

## 2012-11-08 DIAGNOSIS — I4892 Unspecified atrial flutter: Secondary | ICD-10-CM

## 2012-11-08 DIAGNOSIS — Z7901 Long term (current) use of anticoagulants: Secondary | ICD-10-CM

## 2012-11-08 DIAGNOSIS — Q211 Atrial septal defect: Secondary | ICD-10-CM

## 2012-11-08 LAB — POCT INR: INR: 1.8

## 2012-11-12 ENCOUNTER — Ambulatory Visit (INDEPENDENT_AMBULATORY_CARE_PROVIDER_SITE_OTHER): Payer: 59 | Admitting: Internal Medicine

## 2012-11-12 ENCOUNTER — Encounter: Payer: Self-pay | Admitting: Internal Medicine

## 2012-11-12 VITALS — BP 120/62 | HR 94 | Temp 98.1°F | Wt 126.0 lb

## 2012-11-12 DIAGNOSIS — M25512 Pain in left shoulder: Secondary | ICD-10-CM

## 2012-11-12 DIAGNOSIS — Z8669 Personal history of other diseases of the nervous system and sense organs: Secondary | ICD-10-CM

## 2012-11-12 DIAGNOSIS — M25511 Pain in right shoulder: Secondary | ICD-10-CM

## 2012-11-12 DIAGNOSIS — M25519 Pain in unspecified shoulder: Secondary | ICD-10-CM

## 2012-11-12 DIAGNOSIS — M25559 Pain in unspecified hip: Secondary | ICD-10-CM

## 2012-11-12 DIAGNOSIS — M549 Dorsalgia, unspecified: Secondary | ICD-10-CM

## 2012-11-12 NOTE — Progress Notes (Signed)
  Subjective:    Patient ID: Jay Walters, male    DOB: 1931/12/13, 79 y.o.   MRN: [id number]  HPI  The right patellar fracture that he had after a fall at home is healing. This certainly delayed his right knee replacement recovery. Knee replacement was done in January. Still having considerable swelling in right knee. Home physical therapy just recently restarted. He has some weakness in his right foot and some trace nonpitting edema. He's ambulating fairly well with a walker. Seems to be getting stronger. No urinary tract infection symptoms. He saw cardiologist recently who advised him to go back on Lipitor. Has some generalized arthralgias. We're going to draw arthritis studies today and refer him to rheumatologist. His Dilantin level has recently normalized.    Review of Systems     Objective:   Physical Exam Neck is supple without adenopathy. Chest clear to auscultation. Cardiac exam regular rate and rhythm. Right knee remains swollen with well-healed scar. No significant tenderness about the knee. He has some slight swelling of his right foot with trace nonpitting edema. Right foot is slightly weak 4/5.        Assessment & Plan:  Weakness right foot  Status post right knee replacement with recent right patellar fracture secondary to a fall  History of Dilantin toxicity when hospitalized in rehabilitation facility-Dilantin level has now normalized  Generalized arthralgias-rule out inflammatory arthritis. Arthritis studies drawn today including ANA, sedimentation rate, CCP. Refer to rheumatologist.  Status post IV antibiotic treatment for UTI  Plan: Return in 2 months.

## 2012-11-12 NOTE — Patient Instructions (Addendum)
Labs have been drawn today for inflammatory arthritis. Rheumatology consultation will be obtained. Continue same medications and return in 8 weeks. He may restart Lipitor.

## 2012-11-13 LAB — RHEUMATOID FACTOR: Rhuematoid fact SerPl-aCnc: 12 IU/mL (ref <=14)

## 2012-11-21 DIAGNOSIS — Z8669 Personal history of other diseases of the nervous system and sense organs: Secondary | ICD-10-CM

## 2012-11-21 DIAGNOSIS — M549 Dorsalgia, unspecified: Secondary | ICD-10-CM

## 2012-11-21 DIAGNOSIS — M25559 Pain in unspecified hip: Secondary | ICD-10-CM

## 2012-11-21 DIAGNOSIS — M25519 Pain in unspecified shoulder: Secondary | ICD-10-CM

## 2012-11-25 ENCOUNTER — Telehealth: Payer: Self-pay | Admitting: Internal Medicine

## 2012-11-25 NOTE — Telephone Encounter (Signed)
He's using soap/water and neosporin on the places.  She wants to know if he needs anything more.  She's questioning if perhaps his dementia is more elevated at night.    Question is:  Do you want anything more on these 3 areas or just a note in his file that he fell and has received instructions??   Please advise.

## 2012-11-25 NOTE — Telephone Encounter (Signed)
Spoke with Santina Evans at [facility]. She does not feel it is necessary to see patient Tuesday. She will see him again on Wednesday and call us with an update.

## 2012-11-25 NOTE — Telephone Encounter (Signed)
Please call Home health nurse and see if she thinks we need to see him tomorrow as i am going out of town for remainder of week.

## 2012-11-29 ENCOUNTER — Encounter: Payer: Self-pay | Admitting: Cardiovascular Disease

## 2012-11-29 ENCOUNTER — Ambulatory Visit: Payer: 59 | Admitting: Pharmacist Clinician (PhC)/ Clinical Pharmacy Specialist

## 2012-11-29 ENCOUNTER — Ambulatory Visit (HOSPITAL_COMMUNITY): Payer: 59

## 2012-12-10 ENCOUNTER — Ambulatory Visit (INDEPENDENT_AMBULATORY_CARE_PROVIDER_SITE_OTHER): Payer: 59 | Admitting: Pharmacist Clinician (PhC)/ Clinical Pharmacy Specialist

## 2012-12-10 ENCOUNTER — Ambulatory Visit (HOSPITAL_COMMUNITY)
Admission: RE | Admit: 2012-12-10 | Discharge: 2012-12-10 | Disposition: A | Discharge status: Home / Self Care | Payer: 59 | Source: Ambulatory Visit | Attending: Cardiovascular Disease | Admitting: Cardiovascular Disease

## 2012-12-10 VITALS — BP 130/62 | HR 68

## 2012-12-10 DIAGNOSIS — Q211 Atrial septal defect: Secondary | ICD-10-CM | POA: Insufficient documentation

## 2012-12-10 DIAGNOSIS — I480 Paroxysmal atrial fibrillation: Secondary | ICD-10-CM

## 2012-12-10 DIAGNOSIS — I4891 Unspecified atrial fibrillation: Secondary | ICD-10-CM | POA: Insufficient documentation

## 2012-12-10 DIAGNOSIS — I4892 Unspecified atrial flutter: Secondary | ICD-10-CM

## 2012-12-10 DIAGNOSIS — I495 Sick sinus syndrome: Secondary | ICD-10-CM | POA: Insufficient documentation

## 2012-12-10 DIAGNOSIS — Z7901 Long term (current) use of anticoagulants: Secondary | ICD-10-CM

## 2012-12-10 DIAGNOSIS — Q2111 Secundum atrial septal defect: Secondary | ICD-10-CM | POA: Insufficient documentation

## 2012-12-10 HISTORY — PX: TRANSTHORACIC ECHOCARDIOGRAM: SHX275

## 2012-12-10 MED ORDER — WARFARIN SODIUM 5 MG PO TABS: ORAL_TABLET | ORAL | Status: DC

## 2012-12-10 NOTE — Progress Notes (Signed)
[facility]   2D echo completed 12/10/2012.   Cindy Jazman Reuter, RDCS  

## 2012-12-17 ENCOUNTER — Ambulatory Visit (INDEPENDENT_AMBULATORY_CARE_PROVIDER_SITE_OTHER): Payer: Self-pay | Admitting: Pharmacist Clinician (PhC)/ Clinical Pharmacy Specialist

## 2012-12-17 ENCOUNTER — Telehealth: Payer: Self-pay | Admitting: Pharmacist Clinician (PhC)/ Clinical Pharmacy Specialist

## 2012-12-17 ENCOUNTER — Telehealth: Payer: Self-pay | Admitting: Cardiovascular Disease

## 2012-12-17 DIAGNOSIS — I4892 Unspecified atrial flutter: Secondary | ICD-10-CM

## 2012-12-17 DIAGNOSIS — Z7901 Long term (current) use of anticoagulants: Secondary | ICD-10-CM

## 2012-12-17 LAB — POCT INR: INR: 2.3

## 2012-12-17 MED ORDER — ATORVASTATIN CALCIUM 20 MG PO TABS: 20.0000 mg | ORAL_TABLET | Freq: Every day | ORAL | Status: DC

## 2012-12-17 NOTE — Telephone Encounter (Signed)
See anticoagulation encounter

## 2012-12-17 NOTE — Telephone Encounter (Signed)
HAS AN ORDER TO DO INR AT PT'S HOME -DO YOU  WANT HER TO USE INR MACHINE OR DO VENOUS PUNCTURE?-WILL ONLY BE AT HIS HOME FOR THE NEXT 30 MINUTES! AFTER THAT PLEASE CALL-[phone]

## 2012-12-17 NOTE — Telephone Encounter (Signed)
INR is 2.3-Call pt to advise!Please call Thayer Ohm and let her know what is going on!

## 2012-12-17 NOTE — Telephone Encounter (Signed)
Returned call.  Fax machine.  Called [phone] and left message that Dr. Alanda Amass does not have a preference and to call results to Phillips Hay, PharmD.

## 2012-12-25 ENCOUNTER — Telehealth: Payer: Self-pay | Admitting: Internal Medicine

## 2012-12-26 ENCOUNTER — Ambulatory Visit (INDEPENDENT_AMBULATORY_CARE_PROVIDER_SITE_OTHER): Payer: Self-pay | Admitting: Pharmacist Clinician (PhC)/ Clinical Pharmacy Specialist

## 2012-12-26 ENCOUNTER — Other Ambulatory Visit: Payer: Self-pay

## 2012-12-26 DIAGNOSIS — Z7901 Long term (current) use of anticoagulants: Secondary | ICD-10-CM

## 2012-12-26 DIAGNOSIS — I4892 Unspecified atrial flutter: Secondary | ICD-10-CM

## 2012-12-26 MED ORDER — PHENYTOIN SODIUM EXTENDED 100 MG PO CAPS: 300.0000 mg | ORAL_CAPSULE | Freq: Every day | ORAL | Status: DC

## 2012-12-26 MED ORDER — OXYCODONE-ACETAMINOPHEN 5-325 MG PO TABS: 1.0000 | ORAL_TABLET | Freq: Every evening | ORAL | Status: DC | PRN

## 2012-12-26 NOTE — Telephone Encounter (Signed)
Call rheumatologist for this

## 2012-12-27 ENCOUNTER — Telehealth: Payer: Self-pay | Admitting: Internal Medicine

## 2012-12-27 ENCOUNTER — Telehealth: Payer: Self-pay

## 2012-12-27 DIAGNOSIS — M25551 Pain in right hip: Secondary | ICD-10-CM

## 2012-12-27 DIAGNOSIS — M549 Dorsalgia, unspecified: Secondary | ICD-10-CM

## 2012-12-27 NOTE — Telephone Encounter (Signed)
Spoke  With [employer]. Feels patient is just depressed. Left message at work for wifr to call me. Pt there alone. Son not there. Will try to contact son.

## 2012-12-27 NOTE — Telephone Encounter (Signed)
On [facility] on [employer] [employer] here in [place] until we have completed this round of tests per Dr. Lenord Fellers.

## 2012-12-30 ENCOUNTER — Telehealth: Payer: Self-pay

## 2012-12-30 ENCOUNTER — Ambulatory Visit
Admission: RE | Admit: 2012-12-30 | Discharge: 2012-12-30 | Disposition: A | Discharge status: Home / Self Care | Payer: Medicare Other | Source: Ambulatory Visit | Attending: Internal Medicine | Admitting: Internal Medicine

## 2012-12-30 DIAGNOSIS — M5137 Other intervertebral disc degeneration, lumbosacral region: Secondary | ICD-10-CM | POA: Diagnosis not present

## 2012-12-30 DIAGNOSIS — M47817 Spondylosis without myelopathy or radiculopathy, lumbosacral region: Secondary | ICD-10-CM | POA: Diagnosis not present

## 2012-12-30 DIAGNOSIS — M48061 Spinal stenosis, lumbar region without neurogenic claudication: Secondary | ICD-10-CM

## 2012-12-30 DIAGNOSIS — M169 Osteoarthritis of hip, unspecified: Secondary | ICD-10-CM | POA: Diagnosis not present

## 2012-12-30 MED ORDER — AMITRIPTYLINE HCL 10 MG PO TABS: 10.0000 mg | ORAL_TABLET | Freq: Every day | ORAL | Status: DC

## 2012-12-30 NOTE — Telephone Encounter (Signed)
Patient and wife both informed. Elavil sent elec. To pharmacy

## 2012-12-30 NOTE — Telephone Encounter (Signed)
Pt needs MRI of LS spine. He is s/p knee replacement Jan 2014. Staff at [facility] confirms this is a titanium knee replacement therefore no concerns for MRI being done.

## 2012-12-31 NOTE — Telephone Encounter (Signed)
Patient scheduled for an MRI of L Spine on 01/02/2013 at 2:30pm at [address]. Informed of this.

## 2012-12-31 NOTE — Telephone Encounter (Signed)
Patient informed. 

## 2013-01-02 ENCOUNTER — Telehealth: Payer: Self-pay | Admitting: Cardiovascular Disease

## 2013-01-02 ENCOUNTER — Ambulatory Visit
Admission: RE | Admit: 2013-01-02 | Discharge: 2013-01-02 | Disposition: A | Discharge status: Home / Self Care | Payer: Medicare Other | Source: Ambulatory Visit | Attending: Internal Medicine | Admitting: Internal Medicine

## 2013-01-02 ENCOUNTER — Ambulatory Visit (INDEPENDENT_AMBULATORY_CARE_PROVIDER_SITE_OTHER): Payer: Self-pay | Admitting: Pharmacist Clinician (PhC)/ Clinical Pharmacy Specialist

## 2013-01-02 ENCOUNTER — Telehealth: Payer: Self-pay | Admitting: Pharmacist Clinician (PhC)/ Clinical Pharmacy Specialist

## 2013-01-02 ENCOUNTER — Telehealth: Payer: Self-pay

## 2013-01-02 ENCOUNTER — Other Ambulatory Visit: Payer: Self-pay

## 2013-01-02 DIAGNOSIS — I4892 Unspecified atrial flutter: Secondary | ICD-10-CM

## 2013-01-02 DIAGNOSIS — Z7901 Long term (current) use of anticoagulants: Secondary | ICD-10-CM

## 2013-01-02 MED ORDER — PHENYTOIN SODIUM EXTENDED 100 MG PO CAPS: 300.0000 mg | ORAL_CAPSULE | Freq: Every day | ORAL | Status: DC

## 2013-01-02 NOTE — Telephone Encounter (Signed)
INR is 1.7

## 2013-01-02 NOTE — Telephone Encounter (Signed)
Please call [facility] and see why this cannot be refilled. Ask for Dr. Kandis Cocking nurse. I want to see Jay Walters next week about his depression.

## 2013-01-02 NOTE — Telephone Encounter (Signed)
Talked to pt. Today about his medications issue resolved

## 2013-01-02 NOTE — Telephone Encounter (Signed)
Mrs. Zalesky is asking if you would Rx his Lanoxin because they can't get any response from Dr. Kandis Cocking office. Looks like he is on 1/2 tablet alternating with one whole tablet every other day.?0.125mg .

## 2013-01-02 NOTE — Telephone Encounter (Signed)
See anticoag encounter

## 2013-01-02 NOTE — Telephone Encounter (Signed)
error 

## 2013-01-02 NOTE — Telephone Encounter (Signed)
Mr. Cozort states that he is having medication issues---wants to speak with JC

## 2013-01-03 ENCOUNTER — Telehealth: Payer: Self-pay | Admitting: Cardiovascular Disease

## 2013-01-03 NOTE — Telephone Encounter (Signed)
Left message with receptionist, who will send it directly to Dr. Alanda Amass.

## 2013-01-03 NOTE — Telephone Encounter (Signed)
Appointment made for Tuesday 01/07/2013

## 2013-01-03 NOTE — Telephone Encounter (Signed)
[name] from Dr. Beryle Quant office called stating that Jay Walters would like for his Lanoxin to be filled. Dr. Lenord Fellers did not want to do it because he was a pt of Dr. Kandis Cocking. She stated that she thinks that he is completely out of the medication. She asked if we can refill it to [facility].

## 2013-01-06 ENCOUNTER — Telehealth: Payer: Self-pay | Admitting: Internal Medicine

## 2013-01-06 NOTE — Telephone Encounter (Signed)
Has appt here tomorrow for evaluation. Will decide about further PT then.

## 2013-01-06 NOTE — Telephone Encounter (Signed)
JC, LPN spoke w/ pt on 7.24.14 r/t medication issues.  Will defer message to him to review.

## 2013-01-07 ENCOUNTER — Ambulatory Visit (INDEPENDENT_AMBULATORY_CARE_PROVIDER_SITE_OTHER): Payer: 59 | Admitting: Internal Medicine

## 2013-01-07 ENCOUNTER — Encounter: Payer: Self-pay | Admitting: Internal Medicine

## 2013-01-07 VITALS — BP 128/60 | HR 80 | Temp 97.1°F | Wt 126.5 lb

## 2013-01-07 DIAGNOSIS — N401 Enlarged prostate with lower urinary tract symptoms: Secondary | ICD-10-CM

## 2013-01-07 DIAGNOSIS — M48061 Spinal stenosis, lumbar region without neurogenic claudication: Secondary | ICD-10-CM

## 2013-01-07 DIAGNOSIS — R339 Retention of urine, unspecified: Secondary | ICD-10-CM

## 2013-01-07 LAB — POCT URINALYSIS DIPSTICK
Ketones, UA: NEGATIVE
Protein, UA: NEGATIVE
Spec Grav, UA: 1.025

## 2013-01-07 MED ORDER — TAMSULOSIN HCL 0.4 MG PO CAPS: 0.4000 mg | ORAL_CAPSULE | Freq: Every day | ORAL | Status: DC

## 2013-01-07 NOTE — Patient Instructions (Addendum)
Take tramadol in the morning for pain and oxycodone at night for pain. Continue Elavil at bedtime. Take Flomax regularly for her urinary frequency. Had epidural steroid injections per Dr. Ethelene Hal continue physical therapy at home.

## 2013-01-07 NOTE — Telephone Encounter (Signed)
LMOM for Jay Walters that Dr. Lenord Fellers is ok with additional 4-6 weeks of PT at home twice a week.  She is to call our office back if she has additional questions.

## 2013-01-07 NOTE — Progress Notes (Addendum)
  Subjective:    Patient ID: Jay Walters, male    DOB: 1931-07-01, 79 y.o.   MRN: [id number]  HPI Comes in today with his son. He says son has not been keeping pillbox filled correctly as he would like. Nurse thought he might be depressed. A therapist has been sent out to talk with him. I think he is mildly depressed because he's been incapacitated since January. Is not able to drive. He is basically housebound. Recently I placed him on Elavil to help with sleep. He was a bit drowsy in the mornings. That is to be expected. It did help with the pain. He's been resting better. Recently found to have distended bladder on imaging studies. Has not been taking Flomax regularly. Has had some urinary frequency. We checked his urine today and there is no evidence of infection. He is not suicidal. He may be slightly depressed but has a good awareness of his situation. We have found recently that he has foraminal stenosis at L3-L4 and L4-L5 on the right. He says he previously took epidural steroid injections per Dr. Ethelene Hal when he was playing golf for back issues. We have an appointment for him to see Dr. rhinitis in late August for that. Explained to patient to take 2 or 3 injections to see a difference. He is willing to do this. He is also seeing Dr. Alanda Amass recently for cardiac issues. He remains on Coumadin. Therefore is not a candidate for anti-inflammatory medication.    Review of Systems     Objective:   Physical Exam affect is normal. Right knee is swollen. Complains of pain radiating into his right hip and down right lateral leg.        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 He will take tramadol 50 mg in the morning for pain and oxycodone at bedtime for pain. He will continue with Elavil at bedtime for mild depression and pain control. He will see Dr. Ethelene Hal. regarding  epidural steroid injection. He will continue physical therapy. We'll continue to see therapist. Reviewed with him his medication schedule and also reviewed same schedule with his son. There has been some confusion from time to time about what he should be taking. Patient and son says that wife sometimes tells him not to take some of his medications.  25 minutes spent with patient and son

## 2013-01-09 ENCOUNTER — Telehealth: Payer: Self-pay | Admitting: Pharmacist Clinician (PhC)/ Clinical Pharmacy Specialist

## 2013-01-09 ENCOUNTER — Ambulatory Visit (INDEPENDENT_AMBULATORY_CARE_PROVIDER_SITE_OTHER): Payer: Self-pay | Admitting: Pharmacist

## 2013-01-09 DIAGNOSIS — Z7901 Long term (current) use of anticoagulants: Secondary | ICD-10-CM

## 2013-01-09 DIAGNOSIS — I4892 Unspecified atrial flutter: Secondary | ICD-10-CM

## 2013-01-09 LAB — POCT INR: INR: 1.7

## 2013-01-09 NOTE — Telephone Encounter (Signed)
INR is 9.7-Please call.

## 2013-01-09 NOTE — Telephone Encounter (Signed)
Spoke with Thayer Ohm.  INR was actually 1.7.  See anti-coag note for dosing details.

## 2013-01-13 ENCOUNTER — Ambulatory Visit: Payer: Self-pay | Admitting: Internal Medicine

## 2013-01-15 DIAGNOSIS — G40802 Other epilepsy, not intractable, without status epilepticus: Secondary | ICD-10-CM

## 2013-01-15 DIAGNOSIS — I4891 Unspecified atrial fibrillation: Secondary | ICD-10-CM

## 2013-01-15 DIAGNOSIS — Z96659 Presence of unspecified artificial knee joint: Secondary | ICD-10-CM

## 2013-01-15 NOTE — Telephone Encounter (Signed)
Digoxin reordered [facility]

## 2013-01-16 ENCOUNTER — Telehealth: Payer: Self-pay | Admitting: Cardiovascular Disease

## 2013-01-16 ENCOUNTER — Ambulatory Visit (INDEPENDENT_AMBULATORY_CARE_PROVIDER_SITE_OTHER): Payer: Self-pay | Admitting: Pharmacist

## 2013-01-16 DIAGNOSIS — I4892 Unspecified atrial flutter: Secondary | ICD-10-CM

## 2013-01-16 DIAGNOSIS — Z7901 Long term (current) use of anticoagulants: Secondary | ICD-10-CM

## 2013-01-16 LAB — POCT INR: INR: 2.3

## 2013-01-16 NOTE — Telephone Encounter (Signed)
Spoke with French Ana.  Orders given and INR dosed.  See anti-coag visit for details.

## 2013-01-16 NOTE — Telephone Encounter (Signed)
Jay Walters is calling in Mr. Conaway INR  INR 2.3 PT 28.1

## 2013-01-23 DIAGNOSIS — M4800 Spinal stenosis, site unspecified: Secondary | ICD-10-CM

## 2013-01-23 DIAGNOSIS — I4891 Unspecified atrial fibrillation: Secondary | ICD-10-CM

## 2013-01-27 ENCOUNTER — Other Ambulatory Visit: Payer: Self-pay | Admitting: Internal Medicine

## 2013-01-28 ENCOUNTER — Telehealth: Payer: Self-pay | Admitting: Internal Medicine

## 2013-01-28 NOTE — Telephone Encounter (Signed)
Noted pt has been discharged from Home PT

## 2013-02-04 ENCOUNTER — Telehealth: Payer: Self-pay | Admitting: Internal Medicine

## 2013-02-04 ENCOUNTER — Other Ambulatory Visit: Payer: Self-pay

## 2013-02-04 MED ORDER — TRAMADOL HCL 50 MG PO TABS: 50.0000 mg | ORAL_TABLET | Freq: Four times a day (QID) | ORAL | Status: DC | PRN

## 2013-02-04 NOTE — Telephone Encounter (Signed)
Spoke with Dr. Lenord Fellers by phone 02/04/13 @ 1530 and took verbal order for Tramadol 50mg  1 every 8 hours for pain as needed.  #90.  Oxycodone Bonita Quin is to print out and Dr. Lenord Fellers will sign it upon her return to the office on Thursday.  Benjamine Mola will pick it up on Friday.

## 2013-02-07 ENCOUNTER — Other Ambulatory Visit: Payer: Self-pay

## 2013-02-07 MED ORDER — OXYCODONE-ACETAMINOPHEN 5-325 MG PO TABS: 1.0000 | ORAL_TABLET | Freq: Every evening | ORAL | Status: DC | PRN

## 2013-02-11 ENCOUNTER — Telehealth: Payer: Self-pay

## 2013-02-12 ENCOUNTER — Encounter: Payer: Self-pay | Admitting: Gastroenterology

## 2013-02-24 ENCOUNTER — Ambulatory Visit (INDEPENDENT_AMBULATORY_CARE_PROVIDER_SITE_OTHER): Payer: 59 | Admitting: Pharmacist Clinician (PhC)/ Clinical Pharmacy Specialist

## 2013-02-24 VITALS — BP 118/60 | HR 64

## 2013-02-24 DIAGNOSIS — Z7901 Long term (current) use of anticoagulants: Secondary | ICD-10-CM

## 2013-02-24 DIAGNOSIS — I4892 Unspecified atrial flutter: Secondary | ICD-10-CM | POA: Diagnosis not present

## 2013-02-24 LAB — POCT INR: INR: 1.5

## 2013-03-10 ENCOUNTER — Ambulatory Visit (INDEPENDENT_AMBULATORY_CARE_PROVIDER_SITE_OTHER): Payer: 59 | Admitting: Pharmacist Clinician (PhC)/ Clinical Pharmacy Specialist

## 2013-03-10 VITALS — BP 110/56 | HR 60

## 2013-03-10 DIAGNOSIS — Z7901 Long term (current) use of anticoagulants: Secondary | ICD-10-CM | POA: Diagnosis not present

## 2013-03-10 DIAGNOSIS — I4892 Unspecified atrial flutter: Secondary | ICD-10-CM | POA: Diagnosis not present

## 2013-03-10 LAB — POCT INR: INR: 1.4

## 2013-03-12 ENCOUNTER — Encounter: Payer: Self-pay | Admitting: Internal Medicine

## 2013-03-12 NOTE — Patient Instructions (Addendum)
Return in 2 weeks

## 2013-03-13 ENCOUNTER — Ambulatory Visit: Payer: 59 | Admitting: Internal Medicine

## 2013-03-18 ENCOUNTER — Telehealth: Payer: Self-pay | Admitting: Cardiovascular Disease

## 2013-03-18 NOTE — Telephone Encounter (Signed)
Would like to know if Dr. Ethelene Hal office contacted you about him stopping his coumadin .Jay Walters He does have an appt with you on tomorrow .Jay Walters Please Call

## 2013-03-18 NOTE — Telephone Encounter (Signed)
Pt having spinal steroid injection on 10/8.  Ok'd by Dr. Alanda Amass to hold warfarin x 5 days.  Will check INR that am prior to injection for Dr. Ethelene Hal

## 2013-03-19 ENCOUNTER — Ambulatory Visit (INDEPENDENT_AMBULATORY_CARE_PROVIDER_SITE_OTHER): Payer: 59 | Admitting: Pharmacist Clinician (PhC)/ Clinical Pharmacy Specialist

## 2013-03-19 VITALS — BP 130/72 | HR 68

## 2013-03-19 DIAGNOSIS — Z7901 Long term (current) use of anticoagulants: Secondary | ICD-10-CM

## 2013-03-19 DIAGNOSIS — I4892 Unspecified atrial flutter: Secondary | ICD-10-CM

## 2013-03-19 LAB — POCT INR: INR: 1.1

## 2013-03-20 ENCOUNTER — Ambulatory Visit: Payer: Medicare Other | Admitting: Pharmacist Clinician (PhC)/ Clinical Pharmacy Specialist

## 2013-03-24 ENCOUNTER — Ambulatory Visit (INDEPENDENT_AMBULATORY_CARE_PROVIDER_SITE_OTHER): Payer: 59 | Admitting: Internal Medicine

## 2013-03-24 ENCOUNTER — Encounter: Payer: Self-pay | Admitting: Internal Medicine

## 2013-03-24 VITALS — BP 124/66 | HR 80 | Temp 97.5°F | Wt 123.0 lb

## 2013-03-24 DIAGNOSIS — R5381 Other malaise: Secondary | ICD-10-CM

## 2013-03-24 DIAGNOSIS — Z1329 Encounter for screening for other suspected endocrine disorder: Secondary | ICD-10-CM

## 2013-03-24 DIAGNOSIS — Z23 Encounter for immunization: Secondary | ICD-10-CM

## 2013-03-24 DIAGNOSIS — R634 Abnormal weight loss: Secondary | ICD-10-CM

## 2013-03-24 DIAGNOSIS — Z96659 Presence of unspecified artificial knee joint: Secondary | ICD-10-CM

## 2013-03-24 DIAGNOSIS — Z8679 Personal history of other diseases of the circulatory system: Secondary | ICD-10-CM

## 2013-03-24 DIAGNOSIS — Z130 Encounter for screening for diseases of the blood and blood-forming organs and certain disorders involving the immune mechanism: Secondary | ICD-10-CM

## 2013-03-24 DIAGNOSIS — M545 Low back pain: Secondary | ICD-10-CM

## 2013-03-24 DIAGNOSIS — G40909 Epilepsy, unspecified, not intractable, without status epilepticus: Secondary | ICD-10-CM | POA: Diagnosis not present

## 2013-03-24 DIAGNOSIS — Z96651 Presence of right artificial knee joint: Secondary | ICD-10-CM

## 2013-03-24 DIAGNOSIS — Z9889 Other specified postprocedural states: Secondary | ICD-10-CM

## 2013-03-24 DIAGNOSIS — G8929 Other chronic pain: Secondary | ICD-10-CM

## 2013-03-24 DIAGNOSIS — R531 Weakness: Secondary | ICD-10-CM

## 2013-03-24 LAB — CBC WITH DIFFERENTIAL/PLATELET
Basophils Absolute: 0 10*3/uL (ref 0.0–0.1)
Basophils Relative: 1 % (ref 0–1)
Eosinophils Relative: 1 % (ref 0–5)
HCT: 36.2 % — ABNORMAL LOW (ref 39.0–52.0)
MCHC: 33.7 g/dL (ref 30.0–36.0)
MCV: 89.2 fL (ref 78.0–100.0)
Monocytes Absolute: 0.4 10*3/uL (ref 0.1–1.0)
Monocytes Relative: 10 % (ref 3–12)
RDW: 14.6 % (ref 11.5–15.5)

## 2013-03-24 LAB — COMPREHENSIVE METABOLIC PANEL
ALT: 14 U/L (ref 0–53)
AST: 16 U/L (ref 0–37)
Alkaline Phosphatase: 89 U/L (ref 39–117)
Creat: 0.92 mg/dL (ref 0.50–1.35)
Total Bilirubin: 0.3 mg/dL (ref 0.3–1.2)

## 2013-03-24 MED ORDER — OXYCODONE-ACETAMINOPHEN 5-325 MG PO TABS: 1.0000 | ORAL_TABLET | Freq: Every evening | ORAL | Status: DC | PRN

## 2013-03-24 MED ORDER — MEGESTROL ACETATE 40 MG/ML PO SUSP: 40.0000 mg | Freq: Four times a day (QID) | ORAL | Status: DC

## 2013-03-24 NOTE — Patient Instructions (Signed)
Start Megace for weight loss. Oxycodone has been refill for 30 days. Return in 4 weeks. Flu vaccine given.

## 2013-03-24 NOTE — Progress Notes (Signed)
  Subjective:    Patient ID: Jay Walters, male    DOB: 10-May-1932, 79 y.o.   MRN: [id number]  HPI Apprx 9 months since left knee replacement. Knee is better but hurts turning it side to side. Ambulates with a walker. Says he does hot have good control of upper body although he has good strenth in UEs. His balance is not so good with a walker or cane. He attributes that to neuro event a few years ago although it may be worse. Has lost 3.5 pounds and c/o of little appetite. Not sure why this is.  Labs drawn and are pending. Recently had Epidural steroid injection per Dr. Ethelene Hal and pain has improved. Still takes Oxycodone at night plus Elavil for sleep. Sleep is better. Had ablation per Dr. Johney Frame for atrial flutter 2013. Hospitalized for C2 cervical myelopathy/transverse myelitis 2012.    Review of Systems     Objective:   Physical Exam Chest clear. Cor RRR; Ext without edema. Grips strong and equal. Some balance issues when rising from chair to a stand. Affect is appropriate. Alert and oriented.        Assessment & Plan:    3.5 pound weight loss - etiology unclear   S/p left knee replacement   Hx of seizure disorder   Chronic anticoagulation for PAF   Hx of Dilantin toxicity   Chronic back pain s/p epidural steroid injection per Dr. Ethelene Hal    History of C2 cervical myelopathy/transverse myelitis 2012    History of BPH and Pseudomonas urinary tract infection status post hospitalization     Plan:   Try Megace  40 mg/cc-- 5cc po qid   Return in 4 weeks for OV and weight check.    CBC with diff, C-met, TSH drawn today   Refill Oxycodone for 30 days

## 2013-03-25 DIAGNOSIS — Z23 Encounter for immunization: Secondary | ICD-10-CM

## 2013-04-02 ENCOUNTER — Ambulatory Visit: Payer: Medicare Other | Admitting: Pharmacist Clinician (PhC)/ Clinical Pharmacy Specialist

## 2013-04-03 ENCOUNTER — Ambulatory Visit (INDEPENDENT_AMBULATORY_CARE_PROVIDER_SITE_OTHER): Payer: 59 | Admitting: Pharmacist Clinician (PhC)/ Clinical Pharmacy Specialist

## 2013-04-03 VITALS — BP 138/80 | HR 60

## 2013-04-03 DIAGNOSIS — Z7901 Long term (current) use of anticoagulants: Secondary | ICD-10-CM

## 2013-04-03 DIAGNOSIS — I4892 Unspecified atrial flutter: Secondary | ICD-10-CM

## 2013-04-17 ENCOUNTER — Ambulatory Visit (INDEPENDENT_AMBULATORY_CARE_PROVIDER_SITE_OTHER): Payer: 59 | Admitting: Pharmacist Clinician (PhC)/ Clinical Pharmacy Specialist

## 2013-04-17 VITALS — BP 144/78 | HR 68

## 2013-04-17 DIAGNOSIS — Z7901 Long term (current) use of anticoagulants: Secondary | ICD-10-CM | POA: Diagnosis not present

## 2013-04-17 DIAGNOSIS — I4892 Unspecified atrial flutter: Secondary | ICD-10-CM | POA: Diagnosis not present

## 2013-04-17 LAB — POCT INR: INR: 2.1

## 2013-04-28 ENCOUNTER — Ambulatory Visit (INDEPENDENT_AMBULATORY_CARE_PROVIDER_SITE_OTHER): Payer: 59 | Admitting: Internal Medicine

## 2013-04-28 ENCOUNTER — Encounter: Payer: Self-pay | Admitting: Internal Medicine

## 2013-04-28 VITALS — BP 142/78 | HR 52 | Temp 98.1°F | Wt 123.0 lb

## 2013-04-28 DIAGNOSIS — R634 Abnormal weight loss: Secondary | ICD-10-CM

## 2013-04-28 DIAGNOSIS — Z96659 Presence of unspecified artificial knee joint: Secondary | ICD-10-CM

## 2013-04-28 DIAGNOSIS — Z96651 Presence of right artificial knee joint: Secondary | ICD-10-CM

## 2013-04-28 DIAGNOSIS — Z8679 Personal history of other diseases of the circulatory system: Secondary | ICD-10-CM

## 2013-04-28 DIAGNOSIS — Z23 Encounter for immunization: Secondary | ICD-10-CM

## 2013-04-28 DIAGNOSIS — M25561 Pain in right knee: Secondary | ICD-10-CM

## 2013-04-28 DIAGNOSIS — M48061 Spinal stenosis, lumbar region without neurogenic claudication: Secondary | ICD-10-CM

## 2013-04-28 DIAGNOSIS — M25569 Pain in unspecified knee: Secondary | ICD-10-CM

## 2013-04-28 DIAGNOSIS — G40909 Epilepsy, unspecified, not intractable, without status epilepticus: Secondary | ICD-10-CM

## 2013-04-28 DIAGNOSIS — Z7901 Long term (current) use of anticoagulants: Secondary | ICD-10-CM

## 2013-04-28 DIAGNOSIS — R630 Anorexia: Secondary | ICD-10-CM

## 2013-04-28 NOTE — Patient Instructions (Signed)
Continue Megace. Tetanus immunization update given today. Had Zostavax vaccine done at pharmacy. Return in 3 months for office visit and weight check.

## 2013-04-28 NOTE — Progress Notes (Signed)
  Subjective:    Patient ID: Jay Walters, male    DOB: 08-01-1931, 79 y.o.   MRN: [id number]  HPI   Since  visit in October, I started him on Megace for appetite stimulant. He has not gained any weight but he's not lost any weight. Weight is stable at 123 pounds. However, his appetite has improved significantly. He is feeling stronger. He is now able to go down steps without taking one step at a time. Knee is less painful. Still taking oxycodone APAP 5/325 to help with pain in sleeping at bedtime. Says he feels the best he has in several months. He looks stronger.    Review of Systems     Objective:   Physical Exam Chest clear. Cardiac exam regular rate and rhythm. Extremities without edema. Alert and oriented x3. Affect is brighter. Looks stronger.        Assessment & Plan:  Status post right knee replacement  Decreased appetite-improving on Megace. Weight loss is stopped. Has not gained weight. Continue Megace.  History of seizure disorder-treated with Dilantin. Labs drawn in October 2014.  [facility] status post [facility] replacement  History of atrial flutter status post ablation followed by cardiologist  Chronic Coumadin therapy followed by cardiologist  History of spinal stenosis status post epidural steroid injection by Dr. Ethelene Hal with good result  Plan: Refill oxycodone APAP 5/325 #30 one by mouth each bedtime with no refill by written prescription. Return in 3 months. Will need weight check and followup. Will decide on labs at that time. Continue Megace.  Tetanus immunization update given. Prescription given to have Zostavax done pharmacy. Colonoscopy was done 05/27/2002 by Dr. Russella Dar. Over the past year he's not been in good health in order to have that procedure. At his age, it might not be beneficial to repeat it. Consider Hemoccult cards instead.  25 minutes spent with patient

## 2013-05-12 ENCOUNTER — Other Ambulatory Visit: Payer: Self-pay

## 2013-05-12 ENCOUNTER — Other Ambulatory Visit: Payer: Self-pay | Admitting: Pharmacist Clinician (PhC)/ Clinical Pharmacy Specialist

## 2013-05-12 DIAGNOSIS — R300 Dysuria: Secondary | ICD-10-CM

## 2013-05-12 MED ORDER — TAMSULOSIN HCL 0.4 MG PO CAPS: 0.4000 mg | ORAL_CAPSULE | Freq: Every day | ORAL | Status: DC

## 2013-05-15 ENCOUNTER — Ambulatory Visit (INDEPENDENT_AMBULATORY_CARE_PROVIDER_SITE_OTHER): Payer: 59 | Admitting: Pharmacist Clinician (PhC)/ Clinical Pharmacy Specialist

## 2013-05-15 VITALS — BP 136/80 | HR 60

## 2013-05-15 DIAGNOSIS — I4892 Unspecified atrial flutter: Secondary | ICD-10-CM

## 2013-05-15 DIAGNOSIS — Z7901 Long term (current) use of anticoagulants: Secondary | ICD-10-CM

## 2013-05-19 ENCOUNTER — Other Ambulatory Visit: Payer: Self-pay | Admitting: *Deleted

## 2013-05-19 MED ORDER — OXYCODONE-ACETAMINOPHEN 5-325 MG PO TABS: 1.0000 | ORAL_TABLET | Freq: Every evening | ORAL | Status: DC | PRN

## 2013-06-13 ENCOUNTER — Ambulatory Visit (INDEPENDENT_AMBULATORY_CARE_PROVIDER_SITE_OTHER): Payer: 59 | Admitting: Pharmacist Clinician (PhC)/ Clinical Pharmacy Specialist

## 2013-06-13 VITALS — BP 136/60 | HR 60

## 2013-06-13 DIAGNOSIS — I4892 Unspecified atrial flutter: Secondary | ICD-10-CM

## 2013-06-13 DIAGNOSIS — Z7901 Long term (current) use of anticoagulants: Secondary | ICD-10-CM

## 2013-07-11 ENCOUNTER — Ambulatory Visit (INDEPENDENT_AMBULATORY_CARE_PROVIDER_SITE_OTHER): Payer: 59 | Admitting: Pharmacist Clinician (PhC)/ Clinical Pharmacy Specialist

## 2013-07-11 VITALS — BP 162/80 | HR 68

## 2013-07-11 DIAGNOSIS — I4892 Unspecified atrial flutter: Secondary | ICD-10-CM | POA: Diagnosis not present

## 2013-07-11 DIAGNOSIS — Z7901 Long term (current) use of anticoagulants: Secondary | ICD-10-CM | POA: Diagnosis not present

## 2013-07-11 LAB — POCT INR: INR: 2.7

## 2013-07-16 DIAGNOSIS — M1710 Unilateral primary osteoarthritis, unspecified knee: Secondary | ICD-10-CM | POA: Diagnosis not present

## 2013-07-16 DIAGNOSIS — Z96659 Presence of unspecified artificial knee joint: Secondary | ICD-10-CM | POA: Diagnosis not present

## 2013-07-16 DIAGNOSIS — IMO0002 Reserved for concepts with insufficient information to code with codable children: Secondary | ICD-10-CM | POA: Diagnosis not present

## 2013-08-04 ENCOUNTER — Encounter: Payer: Self-pay | Admitting: Internal Medicine

## 2013-08-04 ENCOUNTER — Ambulatory Visit (INDEPENDENT_AMBULATORY_CARE_PROVIDER_SITE_OTHER): Payer: 59 | Admitting: Internal Medicine

## 2013-08-04 VITALS — BP 160/84 | HR 72 | Temp 97.7°F | Wt 129.0 lb

## 2013-08-04 DIAGNOSIS — R350 Frequency of micturition: Secondary | ICD-10-CM

## 2013-08-04 LAB — POCT URINALYSIS DIPSTICK
Bilirubin, UA: NEGATIVE
Glucose, UA: NEGATIVE
KETONES UA: NEGATIVE
Leukocytes, UA: NEGATIVE
Nitrite, UA: NEGATIVE
PROTEIN UA: NEGATIVE
RBC UA: NEGATIVE
SPEC GRAV UA: 1.02
Urobilinogen, UA: NEGATIVE
pH, UA: 6

## 2013-08-04 MED ORDER — CYCLOBENZAPRINE HCL 10 MG PO TABS: ORAL_TABLET | ORAL | Status: DC

## 2013-08-04 MED ORDER — OXYCODONE-ACETAMINOPHEN 10-325 MG PO TABS: 1.0000 | ORAL_TABLET | Freq: Three times a day (TID) | ORAL | Status: DC | PRN

## 2013-08-04 NOTE — Progress Notes (Addendum)
   Subjective:    Patient ID: Jay Walters, male    DOB: 1932/04/08, 80 y.o.   MRN: [id number]  HPI In today for recheck of weight. He has run out of Megace. Despite that he has gained 6 pounds since last visit. Appetite apparently has improved according to patient and wife. He is at least 2 meals daily. Previous he would weigh around 150 pounds. He's not done well status post right knee replacement January 2014. During rehabilitation he was hospitalized with Dilantin toxicity. He had considerable home physical therapy. He seemed to be doing better but recently had a plumbing issue and did a lot of plunging apparently reinjuring his right knee. Unfortunately he was recently released by orthopedist. He saw a PA there recently. He did tell them about the incident with plunging that will it. Now his back and right knee hurts. Wife is with him today. She says he has a lot of urinary frequency/nocturia. He is on Cardura for cardiac reasons and also several months ago added Flomax. He says he's taking both of these. Has to get up several times nightly to urinate. Previous imaging studies noted a distended bladder. Urinalysis today is negative for infection. He did have urinary tract infection status post hospitalization for knee replacement in 2014. He is under care of cardiologist for hiistory of atrial fibrillation/flutter. He is on chronic Coumadin therapy.    Review of Systems     Objective:   Physical Exam skin warm and dry. Nodes none. Chest clear. Cardiac exam regular rate and rhythm normal S1 and S2. Alert and oriented x3. Extremities without edema.        Assessment & Plan:  Nocturia  History of BPH  Strain right knee status post right knee replacement January 2014  History of lumbar spinal stenosis status post epidural steroids now with recurrent back pain/strain  Plan: Flexeril 10 mg patient is to take one half tablet at bedtime as muscle relaxant for back pain. Increase oxycodone  APAP to 10/325 at bedtime given #90 with no refill. Urology consultation regarding nocturia. Outpatient physical therapy with heat ultrasound massage to right knee and back as needed for 4 weeks. Will discontinue Megace since he has gained weight. Continue to monitor weight. Return in 4 months. No labs drawn today.  25 minutes spent with patient  Addendum: 08/06/2013 -urine culture has grown 100,000 enterococcus species with sensitivities pending. This is unusual because dipstick urinalysis was completely normal.

## 2013-08-04 NOTE — Patient Instructions (Signed)
Urology appt for urinary frquency. Stop megace. Change pain to Oxycocone APAP 10/325 at bedtime. Flexeril for back spasms. Out pt physical therapy.

## 2013-08-06 ENCOUNTER — Telehealth: Payer: Self-pay | Admitting: Internal Medicine

## 2013-08-06 LAB — URINE CULTURE

## 2013-08-06 NOTE — Telephone Encounter (Signed)
Left detailed message on voice mail of wife (DPR on file); [place] @ [phone].  Advised patient does indeed have infection.  Antibiotic called in to [facility] @ [address]  Patient to take Rx for 10 days.  Then come in for repeat UA & Culture in 2 weeks.  Tentative date 3/13.  Wife instructed to call back to confirm that she received message and confirm repeat UA/Culture date.    Verbal order by Dr. Renold Genta for Doxycycline 100mg , bid #20, no refill. Spoke with Lattie Haw @ [facility] [phone]; Doxycycline 100mg ; bid.  #20, no refill.

## 2013-08-11 ENCOUNTER — Ambulatory Visit (INDEPENDENT_AMBULATORY_CARE_PROVIDER_SITE_OTHER): Payer: 59 | Admitting: Pharmacist Clinician (PhC)/ Clinical Pharmacy Specialist

## 2013-08-11 VITALS — BP 146/80 | HR 68

## 2013-08-11 DIAGNOSIS — I4892 Unspecified atrial flutter: Secondary | ICD-10-CM

## 2013-08-11 DIAGNOSIS — Z7901 Long term (current) use of anticoagulants: Secondary | ICD-10-CM | POA: Diagnosis not present

## 2013-08-11 LAB — POCT INR: INR: 2

## 2013-08-22 ENCOUNTER — Other Ambulatory Visit (INDEPENDENT_AMBULATORY_CARE_PROVIDER_SITE_OTHER): Payer: 59 | Admitting: Internal Medicine

## 2013-08-22 DIAGNOSIS — N390 Urinary tract infection, site not specified: Secondary | ICD-10-CM | POA: Diagnosis not present

## 2013-08-22 LAB — POCT URINALYSIS DIPSTICK
BILIRUBIN UA: NEGATIVE
Blood, UA: NEGATIVE
Glucose, UA: NEGATIVE
Ketones, UA: NEGATIVE
LEUKOCYTES UA: NEGATIVE
NITRITE UA: NEGATIVE
PH UA: 5.5
PROTEIN UA: NEGATIVE
Spec Grav, UA: 1.02
Urobilinogen, UA: NEGATIVE

## 2013-08-23 LAB — URINE CULTURE: Colony Count: 5000

## 2013-09-26 ENCOUNTER — Ambulatory Visit (INDEPENDENT_AMBULATORY_CARE_PROVIDER_SITE_OTHER): Payer: 59 | Admitting: Pharmacist Clinician (PhC)/ Clinical Pharmacy Specialist

## 2013-09-26 DIAGNOSIS — I4892 Unspecified atrial flutter: Secondary | ICD-10-CM

## 2013-09-26 DIAGNOSIS — Z7901 Long term (current) use of anticoagulants: Secondary | ICD-10-CM | POA: Diagnosis not present

## 2013-09-26 LAB — POCT INR: INR: 1.7

## 2013-10-03 ENCOUNTER — Other Ambulatory Visit: Payer: Self-pay

## 2013-10-03 ENCOUNTER — Telehealth: Payer: Self-pay | Admitting: Internal Medicine

## 2013-10-03 MED ORDER — FINASTERIDE 5 MG PO TABS: 5.0000 mg | ORAL_TABLET | Freq: Every day | ORAL | Status: DC

## 2013-10-03 NOTE — Telephone Encounter (Signed)
Proscar was prescribed by urologist for BPH enlarged prostate. He should continue this. Helps with urination. Refill x one year.

## 2013-10-28 ENCOUNTER — Ambulatory Visit: Payer: 59 | Admitting: Pharmacist Clinician (PhC)/ Clinical Pharmacy Specialist

## 2013-10-31 ENCOUNTER — Ambulatory Visit (INDEPENDENT_AMBULATORY_CARE_PROVIDER_SITE_OTHER): Payer: 59 | Admitting: Pharmacist Clinician (PhC)/ Clinical Pharmacy Specialist

## 2013-10-31 DIAGNOSIS — I4892 Unspecified atrial flutter: Secondary | ICD-10-CM

## 2013-10-31 DIAGNOSIS — Z7901 Long term (current) use of anticoagulants: Secondary | ICD-10-CM | POA: Diagnosis not present

## 2013-10-31 LAB — POCT INR: INR: 3

## 2013-11-07 ENCOUNTER — Other Ambulatory Visit: Payer: Self-pay | Admitting: Pharmacist Clinician (PhC)/ Clinical Pharmacy Specialist

## 2013-11-07 MED ORDER — WARFARIN SODIUM 5 MG PO TABS: ORAL_TABLET | ORAL | Status: DC

## 2013-11-28 ENCOUNTER — Ambulatory Visit (INDEPENDENT_AMBULATORY_CARE_PROVIDER_SITE_OTHER): Payer: 59 | Admitting: Pharmacist Clinician (PhC)/ Clinical Pharmacy Specialist

## 2013-11-28 DIAGNOSIS — Z7901 Long term (current) use of anticoagulants: Secondary | ICD-10-CM | POA: Diagnosis not present

## 2013-11-28 DIAGNOSIS — I4892 Unspecified atrial flutter: Secondary | ICD-10-CM

## 2013-11-28 LAB — POCT INR
INR: 5.1
INR: 5.1

## 2013-12-05 ENCOUNTER — Ambulatory Visit (INDEPENDENT_AMBULATORY_CARE_PROVIDER_SITE_OTHER): Payer: 59 | Admitting: Internal Medicine

## 2013-12-05 ENCOUNTER — Encounter: Payer: Self-pay | Admitting: Internal Medicine

## 2013-12-05 VITALS — BP 126/78 | HR 58 | Temp 98.4°F | Wt 130.0 lb

## 2013-12-05 DIAGNOSIS — Z789 Other specified health status: Secondary | ICD-10-CM | POA: Diagnosis not present

## 2013-12-05 DIAGNOSIS — Z96659 Presence of unspecified artificial knee joint: Secondary | ICD-10-CM | POA: Diagnosis not present

## 2013-12-05 DIAGNOSIS — Z9181 History of falling: Secondary | ICD-10-CM

## 2013-12-05 DIAGNOSIS — R82998 Other abnormal findings in urine: Secondary | ICD-10-CM | POA: Diagnosis not present

## 2013-12-05 DIAGNOSIS — M545 Low back pain, unspecified: Secondary | ICD-10-CM

## 2013-12-05 DIAGNOSIS — N401 Enlarged prostate with lower urinary tract symptoms: Secondary | ICD-10-CM | POA: Diagnosis not present

## 2013-12-05 DIAGNOSIS — M25569 Pain in unspecified knee: Secondary | ICD-10-CM

## 2013-12-05 DIAGNOSIS — R351 Nocturia: Secondary | ICD-10-CM | POA: Diagnosis not present

## 2013-12-05 DIAGNOSIS — N138 Other obstructive and reflux uropathy: Secondary | ICD-10-CM | POA: Diagnosis not present

## 2013-12-05 DIAGNOSIS — M25561 Pain in right knee: Secondary | ICD-10-CM

## 2013-12-05 DIAGNOSIS — Z7409 Other reduced mobility: Secondary | ICD-10-CM

## 2013-12-05 LAB — POCT URINALYSIS DIPSTICK
BILIRUBIN UA: NEGATIVE
Blood, UA: NEGATIVE
GLUCOSE UA: NEGATIVE
KETONES UA: NEGATIVE
Nitrite, UA: NEGATIVE
PH UA: 5.5
Protein, UA: NEGATIVE
Spec Grav, UA: 1.015
Urobilinogen, UA: NEGATIVE

## 2013-12-05 MED ORDER — GABAPENTIN 300 MG PO CAPS: ORAL_CAPSULE | ORAL | Status: DC

## 2013-12-05 NOTE — Patient Instructions (Addendum)
Back to Dr. Nelva Bush for epidural steroid injection. Start gabapentin 300 mg once daily for chronic pain. Get appt with Dr. Diona Fanti for nocturia. Physical therapy for gait strengthening.

## 2013-12-06 NOTE — Progress Notes (Signed)
   Subjective:    Patient ID: Jay Walters, male    DOB: [birth year], 80 y.o.   MRN: [id number]  HPI Comes in today for followup on multiple medical issues. He is ambulating with a walker. Says right knee still hurts status post replacement and he has seen orthopedist recently. Says x-rays were negative. Continues to have issues with low back pain. Had epidural steroid injection per Dr. Nelva Bush a few months ago that did not really help. Were going to refer him back to Dr. Nelva Bush for another injection. Says orthopedist told him he could have physical therapy there at their facility.  Order was not written. We have written and faxed an order today. He has had a fall within the past month. Loses his balance at times. Needs to have gait strengthening and balance training.  Is having nocturia x5 or 6 times nightly. This is despite being on Flomax. We're going to refer him back to urologist for evaluation. Urinalysis today is abnormal and a culture was sent. He has had urinary tract infections previously after hospitalization.    Review of Systems     Objective:   Physical Exam neck is supple without JVD thyromegaly or carotid bruits. Chest clear to auscultation. Cardiac exam regular rate and rhythm. Extremities without edema. His right knee is not swollen. He ambulates slowly.        Assessment & Plan:  Right knee pain status post right knee replacement  Nocturia despite Flomax-refer back to urologist. Urine culture pending.  Fall recently. Recommend balance training and gait strengthening with physical therapy.  Low back pain-refer back to Dr. Nelva Bush for epidural steroid injection. Start gabapentin 300 mg once daily and may increase to twice daily if helpful.  Return in 4 weeks for reevaluation.

## 2013-12-08 ENCOUNTER — Other Ambulatory Visit: Payer: Self-pay

## 2013-12-08 LAB — URINE CULTURE

## 2013-12-08 MED ORDER — LEVOFLOXACIN 500 MG PO TABS: 500.0000 mg | ORAL_TABLET | Freq: Every day | ORAL | Status: DC

## 2013-12-08 NOTE — Progress Notes (Signed)
Patient informed. Recheck a urine on December 22 2013

## 2013-12-10 ENCOUNTER — Telehealth: Payer: Self-pay | Admitting: Pharmacist Clinician (PhC)/ Clinical Pharmacy Specialist

## 2013-12-11 ENCOUNTER — Ambulatory Visit: Payer: 59 | Admitting: Pharmacist Clinician (PhC)/ Clinical Pharmacy Specialist

## 2013-12-15 DIAGNOSIS — M25569 Pain in unspecified knee: Secondary | ICD-10-CM | POA: Diagnosis not present

## 2013-12-15 NOTE — Telephone Encounter (Signed)
Closed encounter °

## 2013-12-19 ENCOUNTER — Ambulatory Visit (INDEPENDENT_AMBULATORY_CARE_PROVIDER_SITE_OTHER): Payer: 59 | Admitting: *Deleted

## 2013-12-19 DIAGNOSIS — M25569 Pain in unspecified knee: Secondary | ICD-10-CM | POA: Diagnosis not present

## 2013-12-19 DIAGNOSIS — I4892 Unspecified atrial flutter: Secondary | ICD-10-CM

## 2013-12-19 DIAGNOSIS — Z7901 Long term (current) use of anticoagulants: Secondary | ICD-10-CM

## 2013-12-19 LAB — POCT INR: INR: 3.3

## 2013-12-19 NOTE — Progress Notes (Deleted)
Patient was in the neighbor and came by the office.  she states she has bruise on her upper left arm . She states she does not remember hitting against something or falling. No frank bleeding. Patient states area slightly tender.  Patient states since she was in the area , she wanted her protime checked ,she was do to come next week.  RN assess the area. A oval about 3 inches in length and 2 and3/4 inches . [place],purplish dark edge towards the bottom. RN did fingerstick for PROTIME 1.2  Information given to [facility] PH-D by Sheral Apley  RN  INSTRUCTION GIVEN TO PATIENT-PATIENT VERBALIZED UNDERSTANDING.

## 2013-12-22 ENCOUNTER — Other Ambulatory Visit: Payer: 59 | Admitting: Internal Medicine

## 2013-12-25 ENCOUNTER — Other Ambulatory Visit: Payer: 59 | Admitting: Internal Medicine

## 2013-12-26 ENCOUNTER — Other Ambulatory Visit (INDEPENDENT_AMBULATORY_CARE_PROVIDER_SITE_OTHER): Payer: 59 | Admitting: Internal Medicine

## 2013-12-26 ENCOUNTER — Telehealth: Payer: Self-pay

## 2013-12-26 DIAGNOSIS — M25569 Pain in unspecified knee: Secondary | ICD-10-CM | POA: Diagnosis not present

## 2013-12-26 DIAGNOSIS — N390 Urinary tract infection, site not specified: Secondary | ICD-10-CM | POA: Diagnosis not present

## 2013-12-26 LAB — POCT URINALYSIS DIPSTICK
BILIRUBIN UA: NEGATIVE
Blood, UA: NEGATIVE
Glucose, UA: NEGATIVE
KETONES UA: NEGATIVE
Leukocytes, UA: NEGATIVE
Nitrite, UA: NEGATIVE
Protein, UA: NEGATIVE
SPEC GRAV UA: 1.01
Urobilinogen, UA: NEGATIVE
pH, UA: 6

## 2013-12-26 NOTE — Progress Notes (Signed)
   Subjective:    Patient ID: Jay Walters, male    DOB: 13-Sep-1931, 80 y.o.   MRN: [id number]  HPI  Patient had someone drop off a urine specimen for him status post treatment for UTI. Urinalysis is now normal. He was not seen in the office today. He has appointment to be seen by urologist for recurrent urinary infections in the near future. These started after he was in the hospital for seizure medicine toxicity which occurred while he was in the rehabilitation facility after knee replacement. He has nocturia and BPH. He's been on Cardura and Flomax. Cardura was prescribed by his cardiologist for heart disease.    Review of Systems     Objective:   Physical Exam Not examined       Assessment & Plan:  Recurrent urinary tract infections  BPH  Nocturia

## 2013-12-26 NOTE — Patient Instructions (Signed)
Urinalysis now normal status post treatment for UTI. He has appointment see urologist regarding recurrent urinary infections.

## 2013-12-26 NOTE — Telephone Encounter (Signed)
Received walk in form.Patient will be having a back treatment with Dr.Ramos,wanting to know if ok to hold coumadin 5 to 7 days prior to procedure.Message sent to Dr.Hilty for advice.

## 2013-12-26 NOTE — Progress Notes (Signed)
Repeat urine within normal limits. Patient is scheduled to see Dr. Diona Fanti  In August.

## 2013-12-29 ENCOUNTER — Telehealth: Payer: Self-pay | Admitting: Internal Medicine

## 2013-12-29 NOTE — Telephone Encounter (Signed)
Left message for Jay Walters, aware dr hilty is out until tomorrow. There has been a note sent to his computer but will tomorrow before he is back. She is to call with questions.

## 2013-12-29 NOTE — Telephone Encounter (Signed)
She need to know if pt can stop his Coumadin for 5 days before his surgery(01-02-14)?

## 2013-12-30 NOTE — Telephone Encounter (Signed)
I don't think I've ever seen Mr. Summer - he was a [place] patient. I would need to see him prior to providing recommendations about holding his warfarin.  Dr. Debara Pickett

## 2013-12-31 DIAGNOSIS — M25569 Pain in unspecified knee: Secondary | ICD-10-CM | POA: Diagnosis not present

## 2013-12-31 NOTE — Telephone Encounter (Signed)
Returned call to patient Dr.Hilty advised will need appointment before he can clear to hold coumadin.Appointment scheduled with Dr.Hilty Thurs 01/01/14 at 4:15 pm.

## 2014-01-01 ENCOUNTER — Encounter: Payer: Self-pay | Admitting: Internal Medicine

## 2014-01-01 ENCOUNTER — Ambulatory Visit (INDEPENDENT_AMBULATORY_CARE_PROVIDER_SITE_OTHER): Payer: 59 | Admitting: Internal Medicine

## 2014-01-01 VITALS — BP 114/64 | HR 43 | Ht 69.0 in | Wt 128.3 lb

## 2014-01-01 DIAGNOSIS — Z9889 Other specified postprocedural states: Secondary | ICD-10-CM

## 2014-01-01 DIAGNOSIS — I10 Essential (primary) hypertension: Secondary | ICD-10-CM | POA: Diagnosis not present

## 2014-01-01 DIAGNOSIS — Z8774 Personal history of (corrected) congenital malformations of heart and circulatory system: Secondary | ICD-10-CM

## 2014-01-01 DIAGNOSIS — Z8679 Personal history of other diseases of the circulatory system: Secondary | ICD-10-CM

## 2014-01-01 DIAGNOSIS — Z01810 Encounter for preprocedural cardiovascular examination: Secondary | ICD-10-CM | POA: Diagnosis not present

## 2014-01-01 DIAGNOSIS — I483 Typical atrial flutter: Secondary | ICD-10-CM

## 2014-01-01 DIAGNOSIS — I4892 Unspecified atrial flutter: Secondary | ICD-10-CM

## 2014-01-01 NOTE — Patient Instructions (Signed)
OK for injection! - low risk Hold coumadin for 5 days prior and restart after.   STOP digoxin.   Your physician recommends that you schedule a follow-up appointment in: 6 months.

## 2014-01-02 ENCOUNTER — Telehealth: Payer: Self-pay | Admitting: Internal Medicine

## 2014-01-02 DIAGNOSIS — M25569 Pain in unspecified knee: Secondary | ICD-10-CM | POA: Diagnosis not present

## 2014-01-02 NOTE — Telephone Encounter (Signed)
Deanna called wanting to know if clearance was given for Mr Wotton injection RN reviewed. An office visit 01/01/14 Clearance given - faxed office visit,quick disclosure done

## 2014-01-06 ENCOUNTER — Encounter: Payer: Self-pay | Admitting: Internal Medicine

## 2014-01-06 DIAGNOSIS — Z01810 Encounter for preprocedural cardiovascular examination: Secondary | ICD-10-CM | POA: Insufficient documentation

## 2014-01-06 DIAGNOSIS — M25569 Pain in unspecified knee: Secondary | ICD-10-CM | POA: Diagnosis not present

## 2014-01-06 NOTE — Progress Notes (Signed)
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 He was previously followed by Dr. Rollene Fare. At that time, he was seen and was in atrial flutter and felt to be potentially a candidate for A-flutter ablation due to his bradycardia; however, the timing was such that it was recommended that this would be postponed until after he has recovered. He also has a history of a remote ASD repair and was on Coumadin in the past but has not been on that recently. He was restarted on Coumadin after there was time to allow for recovery from the stroke. He did have a CT angiogram of the neck which showed question of thrombus or atherosclerotic plaque at the carotid bifurcation and an ultrasound of the neck was repeated in our office on June 26, 2011, which demonstrated no significant carotid stenosis.  Unfortunately, he has been having back problems necessitating injections. He is planning on undergoing upcoming back injection. For this he will need to stop his warfarin. I feel that he is low risk and should be able to discontinue that without difficulty.  PMHx:  Past Medical History  Diagnosis Date  . Dysphagia     with solids  . Esophageal stricture   . Diverticulosis   . Osteopenia   . Hemorrhoids   . High cholesterol   . Heart murmur   . H/O hiatal hernia   . Hypertension   . Atrial flutter     typical appearing by ekg. s/p ablation 07/28/11  . Atrial fibrillation     remotely   . Sinus bradycardia     asymptomatic  . Stroke 2012    "left me weaker on left side; balance is not good"  . GERD (gastroesophageal reflux disease)   . Seizures     "back w/migraine headaches; 1990's or before"  . Arthritis     "right hip; right knee; lower back ~ 1/2 way across"  . Anemia   . SSS (sick sinus  syndrome)     Past Surgical History  Procedure Laterality Date  . Asd repair  1972    "and repaired my aortic valve, I think"  . Knee arthroscopy      right; "maybe twice"  . Inguinal hernia repair  2011    right  . Cardiac electrophysiology study and ablation  07/28/11  . Total knee arthroplasty  07/10/2012    Procedure: TOTAL KNEE ARTHROPLASTY;  Surgeon: Tobi Bastos, MD;  Location: [facility];  Service: Orthopedics;  Laterality: Right;  . Joint replacement      right knee replacement  . Carotid doppler  06/26/2011    Bilateral ICAs-demonstrated normal patency without eivdence of significant diameter reduction, dissection, or any other vascular abnormality.  . Cardiovascular stress test  03/07/2010    Perfusion defect in the inferior myocardial region is consistent with diaphragmatic attenuation. No ischemia or infarct/scar is seen in the remaining myocardium. No ECG changes.  . Transthoracic echocardiogram  12/10/2012    EF 55-60%. No regional wall motion abnormalities. LA moderately dialted.Mild-moderate regurg of the tricuspid valve.    FAMHx:  Family History  Problem Relation Age of Onset  . Heart disease Father   . Cancer Sister     unsure what type    SOCHx:   reports that he has never smoked. He  has never used smokeless tobacco. He reports that he drinks alcohol. He reports that he does not use illicit drugs.  ALLERGIES:  No Known Allergies  ROS: A comprehensive review of systems was negative except for: Musculoskeletal: positive for back pain Neurological: positive for coordination problems and dizziness  HOME MEDS: Current Outpatient Prescriptions  Medication Sig Dispense Refill  . amitriptyline (ELAVIL) 10 MG tablet Take 1 tablet (10 mg total) by mouth at bedtime.  30 tablet  5  . atorvastatin (LIPITOR) 20 MG tablet Take 1 tablet (20 mg total) by mouth daily.  90 tablet  3  . CARDURA 4 MG tablet Take 1 tablet (4 mg total)  by mouth at bedtime.  90 tablet  1  .  cyclobenzaprine (FLEXERIL) 10 MG tablet One half tab at bedtime  30 tablet  0  . docusate sodium (COLACE) 100 MG capsule Take 100 mg by mouth 2 (two) times daily.      . ferrous sulfate 325 (65 FE) MG tablet Take 325 mg by mouth 2 (two) times daily after a meal.       . finasteride (PROSCAR) 5 MG tablet Take 1 tablet (5 mg total) by mouth daily.  30 tablet  11  . gabapentin (NEURONTIN) 300 MG capsule Take one po twice a day  60 capsule  3  . megestrol (MEGACE ORAL) 40 MG/ML suspension Take 1 mL (40 mg total) by mouth 4 (four) times daily.  240 mL  1  . oxyCODONE-acetaminophen (PERCOCET) 10-325 MG per tablet Take 1 tablet by mouth every 8 (eight) hours as needed for pain.  90 tablet  0  . phenytoin (DILANTIN) 100 MG ER capsule Take 3 capsules (300 mg total) by mouth daily.  270 capsule  3  . polyethylene glycol (MIRALAX / GLYCOLAX) packet Take 17 g by mouth daily as needed.  14 each  0  . tamsulosin (FLOMAX) 0.4 MG CAPS capsule Take 1 capsule (0.4 mg total) by mouth daily.  90 capsule  3  . warfarin (COUMADIN) 5 MG tablet Take 1 to 1.5 tablets by mouth daily as directed  45 tablet  5   No current facility-administered medications for this visit.    LABS/IMAGING: No results found for this or any previous visit (from the past 48 hour(s)). No results found.  VITALS: BP 114/64  Pulse 43  Ht 5\' 9"  (1.753 m)  Wt 128 lb 4.8 oz (58.196 kg)  BMI 18.94 kg/m2  EXAM: General appearance: alert and no distress Neck: no carotid bruit, no JVD and thyroid not enlarged, symmetric, no tenderness/mass/nodules Lungs: clear to auscultation bilaterally Heart: regular rate and rhythm, S1, S2 normal, no murmur, click, rub or gallop Abdomen: soft, non-tender; bowel sounds normal; no masses,  no organomegaly Extremities: extremities normal, atraumatic, no cyanosis or edema Pulses: 2+ and symmetric Skin: Skin color, texture, turgor normal. No rashes or lesions Neurologic: Mental status: Alert, oriented,  thought content appropriate PSych: Normal  EKG: Marked sinus bradycardia with first degree AV block at 43, nonspecific T wave changes  ASSESSMENT: 1. Paroxysmal atrial fibrillation status post ablation, maintaining sinus 2. Hypertension 3. Prior stroke with aphasia and balance problems 4. Ongoing low back pain 5. Chronic anticoagulation on warfarin 6. History of atrial septal defect status post patch repair  PLAN: 1.   Mr. Hoyt is doing well with first atrial fibrillation/atrial flutter. He has not had recurrence and maintains a sinus bradycardia. His blood pressure is well controlled. He is planning on undergoing an  upcoming back injection by Dr. Nelva Bush. I do not see any reason that he cannot undergo that. He should hold his warfarin for 5 days prior to that injection and restart afterwards. Plan to see him back in 6 months or sooner as necessary.  Pixie Casino, MD, [facility] Attending Cardiologist [facility]  HILTY,Kenneth C 01/06/2014, 7:37 PM

## 2014-01-07 ENCOUNTER — Ambulatory Visit: Payer: 59 | Admitting: Pharmacist Clinician (PhC)/ Clinical Pharmacy Specialist

## 2014-01-13 DIAGNOSIS — M25569 Pain in unspecified knee: Secondary | ICD-10-CM | POA: Diagnosis not present

## 2014-01-14 ENCOUNTER — Other Ambulatory Visit: Payer: Self-pay | Admitting: Internal Medicine

## 2014-01-14 ENCOUNTER — Ambulatory Visit: Payer: 59 | Admitting: Pharmacist Clinician (PhC)/ Clinical Pharmacy Specialist

## 2014-01-15 NOTE — Telephone Encounter (Signed)
Refilling Flomax is up to urologist because of all the trouble he had with BPH and urinary infections. Has pt been seen there yet?

## 2014-01-16 ENCOUNTER — Ambulatory Visit (INDEPENDENT_AMBULATORY_CARE_PROVIDER_SITE_OTHER): Payer: 59 | Admitting: Pharmacist Clinician (PhC)/ Clinical Pharmacy Specialist

## 2014-01-16 ENCOUNTER — Encounter: Payer: Self-pay | Admitting: Internal Medicine

## 2014-01-16 ENCOUNTER — Ambulatory Visit (INDEPENDENT_AMBULATORY_CARE_PROVIDER_SITE_OTHER): Payer: 59 | Admitting: Internal Medicine

## 2014-01-16 VITALS — BP 110/66 | HR 48 | Temp 97.8°F | Wt 133.0 lb

## 2014-01-16 DIAGNOSIS — IMO0001 Reserved for inherently not codable concepts without codable children: Secondary | ICD-10-CM

## 2014-01-16 DIAGNOSIS — N400 Enlarged prostate without lower urinary tract symptoms: Secondary | ICD-10-CM | POA: Diagnosis not present

## 2014-01-16 DIAGNOSIS — R350 Frequency of micturition: Secondary | ICD-10-CM | POA: Diagnosis not present

## 2014-01-16 DIAGNOSIS — Z7901 Long term (current) use of anticoagulants: Secondary | ICD-10-CM

## 2014-01-16 DIAGNOSIS — M545 Low back pain, unspecified: Secondary | ICD-10-CM

## 2014-01-16 DIAGNOSIS — M25569 Pain in unspecified knee: Secondary | ICD-10-CM | POA: Diagnosis not present

## 2014-01-16 DIAGNOSIS — N390 Urinary tract infection, site not specified: Secondary | ICD-10-CM

## 2014-01-16 DIAGNOSIS — I4892 Unspecified atrial flutter: Secondary | ICD-10-CM

## 2014-01-16 DIAGNOSIS — M7918 Myalgia, other site: Secondary | ICD-10-CM

## 2014-01-16 DIAGNOSIS — Z96659 Presence of unspecified artificial knee joint: Secondary | ICD-10-CM

## 2014-01-16 DIAGNOSIS — Z96651 Presence of right artificial knee joint: Secondary | ICD-10-CM

## 2014-01-16 LAB — POCT URINALYSIS DIPSTICK
BILIRUBIN UA: NEGATIVE
Blood, UA: NEGATIVE
GLUCOSE UA: NEGATIVE
Ketones, UA: NEGATIVE
Leukocytes, UA: NEGATIVE
Nitrite, UA: NEGATIVE
Protein, UA: NEGATIVE
SPEC GRAV UA: 1.015
UROBILINOGEN UA: NEGATIVE
pH, UA: 5.5

## 2014-01-16 LAB — POCT INR: INR: 2.1

## 2014-01-16 MED ORDER — ATORVASTATIN CALCIUM 20 MG PO TABS: 20.0000 mg | ORAL_TABLET | Freq: Every day | ORAL | Status: DC

## 2014-01-16 MED ORDER — PREDNISONE 10 MG PO TABS: ORAL_TABLET | ORAL | Status: DC

## 2014-01-16 NOTE — Patient Instructions (Signed)
Continue same medications. Return in 4 months. Trial of prednisone to see if that helps joint stiffness.

## 2014-01-16 NOTE — Progress Notes (Signed)
   Subjective:    Patient ID: Jay Walters, male    DOB: 07/30/1931, 80 y.o.   MRN: [id number]  HPI  Gained 3 pounds since last visit. Cardiologist recommended Ensure and he has been drinking one a day. U/A is normal. Has appt to see Urologist on Monday. Remains on Coumadin. Says he is having a lot of nocturia.Had five episodes last evening. Is on Proscar and Cardura.  Had a fall on steps at home with minimal injury. Says he missed the step. Bedroom is upstairs. Is in physical therapy. Goes twice a week.   Says knees feel tight. Has pain med but does not take often. In June, had Enterococcus UTI which was treated with Levaquin and was resolved on recheck in July. Working on balance with PT. No longer on Megace.    Review of Systems     Objective:   Physical Exam Has superficial scrape right shoulder and one minor contusion left scalp. Alert and oriented. Left knee remains puffy.        Assessment & Plan:  Nocturia Hx UTI- none today Chronic Coumadin therapy- per Cardiology Fall risk Musculoskeletal pain  Short course of Prednisone to see if it helps knees 10 mg #21 6-5-4-3-2-1 taper. Take pain meds as needed.  25 minutes spent with patient  Return in 4 months. Will have fasting lab work at that time with office visit.

## 2014-01-20 DIAGNOSIS — M25569 Pain in unspecified knee: Secondary | ICD-10-CM | POA: Diagnosis not present

## 2014-01-23 DIAGNOSIS — M25569 Pain in unspecified knee: Secondary | ICD-10-CM | POA: Diagnosis not present

## 2014-01-30 DIAGNOSIS — M25569 Pain in unspecified knee: Secondary | ICD-10-CM | POA: Diagnosis not present

## 2014-02-06 ENCOUNTER — Ambulatory Visit (INDEPENDENT_AMBULATORY_CARE_PROVIDER_SITE_OTHER): Payer: 59 | Admitting: Pharmacist Clinician (PhC)/ Clinical Pharmacy Specialist

## 2014-02-06 ENCOUNTER — Ambulatory Visit: Payer: 59 | Admitting: Pharmacist Clinician (PhC)/ Clinical Pharmacy Specialist

## 2014-02-06 DIAGNOSIS — I4892 Unspecified atrial flutter: Secondary | ICD-10-CM

## 2014-02-06 DIAGNOSIS — M25569 Pain in unspecified knee: Secondary | ICD-10-CM | POA: Diagnosis not present

## 2014-02-06 DIAGNOSIS — Z7901 Long term (current) use of anticoagulants: Secondary | ICD-10-CM | POA: Diagnosis not present

## 2014-02-06 LAB — POCT INR: INR: 2.3

## 2014-02-25 ENCOUNTER — Other Ambulatory Visit: Payer: Self-pay | Admitting: Pharmacist Clinician (PhC)/ Clinical Pharmacy Specialist

## 2014-02-25 MED ORDER — WARFARIN SODIUM 5 MG PO TABS: ORAL_TABLET | ORAL | Status: DC

## 2014-02-26 ENCOUNTER — Other Ambulatory Visit: Payer: Self-pay | Admitting: Pharmacist Clinician (PhC)/ Clinical Pharmacy Specialist

## 2014-02-26 MED ORDER — WARFARIN SODIUM 5 MG PO TABS: ORAL_TABLET | ORAL | Status: DC

## 2014-03-06 ENCOUNTER — Ambulatory Visit (INDEPENDENT_AMBULATORY_CARE_PROVIDER_SITE_OTHER): Payer: 59 | Admitting: Pharmacist Clinician (PhC)/ Clinical Pharmacy Specialist

## 2014-03-06 DIAGNOSIS — Z7901 Long term (current) use of anticoagulants: Secondary | ICD-10-CM

## 2014-03-06 DIAGNOSIS — I4892 Unspecified atrial flutter: Secondary | ICD-10-CM

## 2014-03-06 LAB — POCT INR: INR: 3.7

## 2014-03-12 ENCOUNTER — Encounter: Payer: Self-pay | Admitting: Gastroenterology

## 2014-03-22 ENCOUNTER — Encounter (HOSPITAL_COMMUNITY): Payer: Self-pay | Admitting: Emergency Medicine

## 2014-03-22 ENCOUNTER — Observation Stay (HOSPITAL_COMMUNITY)
Admission: EM | Admit: 2014-03-22 | Discharge: 2014-03-25 | Disposition: A | Discharge status: Home / Self Care | Payer: 59 | Attending: Internal Medicine | Admitting: Internal Medicine

## 2014-03-22 DIAGNOSIS — K649 Unspecified hemorrhoids: Secondary | ICD-10-CM | POA: Insufficient documentation

## 2014-03-22 DIAGNOSIS — R001 Bradycardia, unspecified: Secondary | ICD-10-CM | POA: Insufficient documentation

## 2014-03-22 DIAGNOSIS — R404 Transient alteration of awareness: Secondary | ICD-10-CM | POA: Diagnosis not present

## 2014-03-22 DIAGNOSIS — K219 Gastro-esophageal reflux disease without esophagitis: Secondary | ICD-10-CM | POA: Diagnosis not present

## 2014-03-22 DIAGNOSIS — I10 Essential (primary) hypertension: Secondary | ICD-10-CM | POA: Diagnosis not present

## 2014-03-22 DIAGNOSIS — K222 Esophageal obstruction: Secondary | ICD-10-CM | POA: Diagnosis not present

## 2014-03-22 DIAGNOSIS — D649 Anemia, unspecified: Secondary | ICD-10-CM | POA: Insufficient documentation

## 2014-03-22 DIAGNOSIS — I4892 Unspecified atrial flutter: Secondary | ICD-10-CM

## 2014-03-22 DIAGNOSIS — Z8673 Personal history of transient ischemic attack (TIA), and cerebral infarction without residual deficits: Secondary | ICD-10-CM | POA: Insufficient documentation

## 2014-03-22 DIAGNOSIS — E780 Pure hypercholesterolemia: Secondary | ICD-10-CM | POA: Insufficient documentation

## 2014-03-22 DIAGNOSIS — R55 Syncope and collapse: Principal | ICD-10-CM | POA: Insufficient documentation

## 2014-03-22 DIAGNOSIS — Z7901 Long term (current) use of anticoagulants: Secondary | ICD-10-CM | POA: Diagnosis not present

## 2014-03-22 DIAGNOSIS — M1990 Unspecified osteoarthritis, unspecified site: Secondary | ICD-10-CM | POA: Insufficient documentation

## 2014-03-22 DIAGNOSIS — M8580 Other specified disorders of bone density and structure, unspecified site: Secondary | ICD-10-CM | POA: Insufficient documentation

## 2014-03-22 DIAGNOSIS — G40909 Epilepsy, unspecified, not intractable, without status epilepticus: Secondary | ICD-10-CM | POA: Insufficient documentation

## 2014-03-22 DIAGNOSIS — Z8679 Personal history of other diseases of the circulatory system: Secondary | ICD-10-CM

## 2014-03-22 DIAGNOSIS — Z8774 Personal history of (corrected) congenital malformations of heart and circulatory system: Secondary | ICD-10-CM

## 2014-03-22 DIAGNOSIS — K5790 Diverticulosis of intestine, part unspecified, without perforation or abscess without bleeding: Secondary | ICD-10-CM | POA: Insufficient documentation

## 2014-03-22 DIAGNOSIS — Z79899 Other long term (current) drug therapy: Secondary | ICD-10-CM | POA: Diagnosis not present

## 2014-03-22 DIAGNOSIS — Z9889 Other specified postprocedural states: Secondary | ICD-10-CM

## 2014-03-22 DIAGNOSIS — N400 Enlarged prostate without lower urinary tract symptoms: Secondary | ICD-10-CM | POA: Diagnosis present

## 2014-03-22 DIAGNOSIS — I440 Atrioventricular block, first degree: Secondary | ICD-10-CM

## 2014-03-22 DIAGNOSIS — R400 Somnolence: Secondary | ICD-10-CM | POA: Diagnosis present

## 2014-03-22 DIAGNOSIS — R011 Cardiac murmur, unspecified: Secondary | ICD-10-CM | POA: Diagnosis not present

## 2014-03-22 LAB — CBC WITH DIFFERENTIAL/PLATELET
Basophils Absolute: 0 10*3/uL (ref 0.0–0.1)
Basophils Relative: 1 % (ref 0–1)
Eosinophils Absolute: 0 10*3/uL (ref 0.0–0.7)
Eosinophils Relative: 1 % (ref 0–5)
HCT: 38 % — ABNORMAL LOW (ref 39.0–52.0)
Hemoglobin: 12.5 g/dL — ABNORMAL LOW (ref 13.0–17.0)
Lymphocytes Relative: 39 % (ref 12–46)
Lymphs Abs: 1.2 10*3/uL (ref 0.7–4.0)
MCH: 29.8 pg (ref 26.0–34.0)
MCHC: 32.9 g/dL (ref 30.0–36.0)
MCV: 90.5 fL (ref 78.0–100.0)
Monocytes Absolute: 0.2 10*3/uL (ref 0.1–1.0)
Monocytes Relative: 8 % (ref 3–12)
Neutro Abs: 1.7 10*3/uL (ref 1.7–7.7)
Neutrophils Relative %: 51 % (ref 43–77)
Platelets: 147 10*3/uL — ABNORMAL LOW (ref 150–400)
RBC: 4.2 MIL/uL — ABNORMAL LOW (ref 4.22–5.81)
RDW: 14.6 % (ref 11.5–15.5)
WBC: 3.2 10*3/uL — ABNORMAL LOW (ref 4.0–10.5)

## 2014-03-22 LAB — PHENYTOIN LEVEL, TOTAL: Phenytoin Lvl: 13.5 ug/mL (ref 10.0–20.0)

## 2014-03-22 LAB — BASIC METABOLIC PANEL
Anion gap: 12 (ref 5–15)
BUN: 20 mg/dL (ref 6–23)
CO2: 27 mEq/L (ref 19–32)
Calcium: 9.1 mg/dL (ref 8.4–10.5)
Chloride: 104 mEq/L (ref 96–112)
Creatinine, Ser: 0.97 mg/dL (ref 0.50–1.35)
GFR calc Af Amer: 88 mL/min — ABNORMAL LOW (ref >90)
GFR calc non Af Amer: 76 mL/min — ABNORMAL LOW (ref >90)
Glucose, Bld: 175 mg/dL — ABNORMAL HIGH (ref 70–99)
Potassium: 3.9 mEq/L (ref 3.7–5.3)
Sodium: 143 mEq/L (ref 137–147)

## 2014-03-22 LAB — PROTIME-INR
INR: 2.9 — ABNORMAL HIGH (ref 0.00–1.49)
Prothrombin Time: 30.3 seconds — ABNORMAL HIGH (ref 11.6–15.2)

## 2014-03-22 LAB — TROPONIN I: Troponin I: <0.3 ng/mL (ref <0.30)

## 2014-03-22 LAB — DIGOXIN LEVEL: Digoxin Level: <0.3 ng/mL — ABNORMAL LOW (ref 0.8–2.0)

## 2014-03-22 NOTE — ED Notes (Signed)
MD at bedside. 

## 2014-03-22 NOTE — ED Provider Notes (Signed)
CSN: [id number]     Arrival date & time 03/22/14  2032 History   First MD Initiated Contact with Patient 03/22/14 2041     Chief Complaint  Patient presents with  . Loss of Consciousness  . Bradycardia     (Consider location/radiation/quality/duration/timing/severity/associated sxs/prior Treatment) HPI  80yM with syncope. Pt was putting up dinner at home when felt generally weak. Went to sit down and then son heard his head hit the wall beside him. Pt started slumping to ground and son had to support him. Pt appeared to make effort to respond verbally to his son, but voice garbled. Was diaphoretic. The patient's son helped him lower on the ground at which time he was not responsive for few seconds. The patient gradually turned around and was able to respond to questions appropriately. He was not confused did not lose control of bowel or bladder did not have any tonic-clonic body movement or any focal deficit. There was no recurrence of this symptom and he did not have any similar events in the past.   Past Medical History  Diagnosis Date  . Dysphagia     with solids  . Esophageal stricture   . Diverticulosis   . Osteopenia   . Hemorrhoids   . High cholesterol   . Heart murmur   . H/O hiatal hernia   . Hypertension   . Atrial flutter     typical appearing by ekg. s/p ablation 07/28/11  . Atrial fibrillation     remotely   . Sinus bradycardia     asymptomatic  . Stroke 2012    "left me weaker on left side; balance is not good"  . GERD (gastroesophageal reflux disease)   . Seizures     "back w/migraine headaches; 1990's or before"  . Arthritis     "right hip; right knee; lower back ~ 1/2 way across"  . Anemia   . SSS (sick sinus syndrome)    Past Surgical History  Procedure Laterality Date  . Asd repair  1972    "and repaired my aortic valve, I think"  . Knee arthroscopy      right; "maybe twice"  . Inguinal hernia repair  2011    right  . Cardiac electrophysiology  study and ablation  07/28/11  . Total knee arthroplasty  07/10/2012    Procedure: TOTAL KNEE ARTHROPLASTY;  Surgeon: Tobi Bastos, MD;  Location: [facility];  Service: Orthopedics;  Laterality: Right;  . Joint replacement      right knee replacement  . Carotid doppler  06/26/2011    Bilateral ICAs-demonstrated normal patency without eivdence of significant diameter reduction, dissection, or any other vascular abnormality.  . Cardiovascular stress test  03/07/2010    Perfusion defect in the inferior myocardial region is consistent with diaphragmatic attenuation. No ischemia or infarct/scar is seen in the remaining myocardium. No ECG changes.  . Transthoracic echocardiogram  12/10/2012    EF 55-60%. No regional wall motion abnormalities. LA moderately dialted.Mild-moderate regurg of the tricuspid valve.   Family History  Problem Relation Age of Onset  . Heart disease Father   . Cancer Sister     unsure what type   History  Substance Use Topics  . Smoking status: Never Smoker   . Smokeless tobacco: Never Used  . Alcohol Use: 0.0 oz/week     Comment: 07/28/11 "1/5 will last me a year; w/company"    Review of Systems  All systems reviewed and negative, other than as noted  in HPI.    Allergies  Review of patient's allergies indicates no known allergies.  Home Medications   Prior to Admission medications   Medication Sig Start Date End Date Taking? Authorizing Provider  amitriptyline (ELAVIL) 10 MG tablet Take 1 tablet (10 mg total) by mouth at bedtime. 12/30/12   Elby Showers, MD  atorvastatin (LIPITOR) 20 MG tablet Take 1 tablet (20 mg total) by mouth daily. 01/16/14   Elby Showers, MD  CARDURA 4 MG tablet Take 1 tablet (4 mg total)  by mouth at bedtime. 01/27/13   Elby Showers, MD  cyclobenzaprine (FLEXERIL) 10 MG tablet One half tab at bedtime 08/04/13   Elby Showers, MD  [place] 125 MCG tablet  12/01/13   Historical Provider, MD  docusate sodium (COLACE) 100 MG capsule Take 100 mg by  mouth 2 (two) times daily.    Historical Provider, MD  ferrous sulfate 325 (65 FE) MG tablet Take 325 mg by mouth 2 (two) times daily after a meal.     Historical Provider, MD  finasteride (PROSCAR) 5 MG tablet Take 1 tablet (5 mg total) by mouth daily. 10/03/13   Elby Showers, MD  gabapentin (NEURONTIN) 300 MG capsule Take one po twice a day 12/05/13   Elby Showers, MD  oxyCODONE-acetaminophen (PERCOCET) 10-325 MG per tablet Take 1 tablet by mouth every 8 (eight) hours as needed for pain. 08/04/13   Elby Showers, MD  phenytoin (DILANTIN) 100 MG ER capsule Take 3 capsules by mouth  every day    Elby Showers, MD  polyethylene glycol (MIRALAX / GLYCOLAX) packet Take 17 g by mouth daily as needed. 07/11/12   Amber Renelda Loma, PA-C  predniSONE (DELTASONE) 10 MG tablet Taking in tapering course 6-5-4-3-2-1 taper 01/16/14   Elby Showers, MD  tamsulosin (FLOMAX) 0.4 MG CAPS capsule Take 1 capsule (0.4 mg total) by mouth daily. 05/12/13   Elby Showers, MD  warfarin (COUMADIN) 5 MG tablet Take 1 to 1.5 tablets by mouth daily as directed 02/26/14   Tommy Medal, RPH-CPP   BP 137/55  Pulse 57  Temp(Src) 98.2 F (36.8 C) (Oral)  Resp 22  Ht 5\' 5"  (1.651 m)  Wt 135 lb (61.236 kg)  BMI 22.47 kg/m2  SpO2 98% Physical Exam  Nursing note and vitals reviewed. Constitutional: He is oriented to person, place, and time. He appears well-developed and well-nourished. No distress.  HENT:  Head: Normocephalic and atraumatic.  Eyes: Conjunctivae are normal. Right eye exhibits no discharge. Left eye exhibits no discharge.  Neck: Neck supple.  No nuchal rigidity  Cardiovascular: Regular rhythm and normal heart sounds.  Exam reveals no gallop and no friction rub.   No murmur heard. bradycardic  Pulmonary/Chest: Effort normal and breath sounds normal. No respiratory distress.  Abdominal: Soft. He exhibits no distension. There is no tenderness.  Musculoskeletal: He exhibits no edema and no tenderness.   Neurological: He is alert and oriented to person, place, and time. No cranial nerve deficit. He exhibits normal muscle tone. Coordination normal.  Skin: Skin is warm and dry.  Psychiatric: He has a normal mood and affect. His behavior is normal. Thought content normal.    ED Course  Procedures (including critical care time) Labs Review Labs Reviewed  DIGOXIN LEVEL - Abnormal; Notable for the following:    Digoxin Level <0.3 (*)    All other components within normal limits  CBC WITH DIFFERENTIAL - Abnormal; Notable for the following:  WBC 3.2 (*)    RBC 4.20 (*)    Hemoglobin 12.5 (*)    HCT 38.0 (*)    Platelets 147 (*)    All other components within normal limits  BASIC METABOLIC PANEL - Abnormal; Notable for the following:    Glucose, Bld 175 (*)    GFR calc non Af Amer 76 (*)    GFR calc Af Amer 88 (*)    All other components within normal limits  PROTIME-INR - Abnormal; Notable for the following:    Prothrombin Time 30.3 (*)    INR 2.90 (*)    All other components within normal limits  PROTIME-INR - Abnormal; Notable for the following:    Prothrombin Time 31.7 (*)    INR 3.07 (*)    All other components within normal limits  CBC WITH DIFFERENTIAL - Abnormal; Notable for the following:    RBC 3.77 (*)    Hemoglobin 11.2 (*)    HCT 33.8 (*)    Platelets 143 (*)    All other components within normal limits  COMPREHENSIVE METABOLIC PANEL - Abnormal; Notable for the following:    Albumin 3.0 (*)    Total Bilirubin <0.2 (*)    GFR calc non Af Amer 80 (*)    All other components within normal limits  GLUCOSE, CAPILLARY - Abnormal; Notable for the following:    Glucose-Capillary 180 (*)    All other components within normal limits  PROTIME-INR - Abnormal; Notable for the following:    Prothrombin Time 28.5 (*)    INR 2.68 (*)    All other components within normal limits  PROTIME-INR - Abnormal; Notable for the following:    Prothrombin Time 21.0 (*)    INR 1.81 (*)     All other components within normal limits  TROPONIN I  PHENYTOIN LEVEL, TOTAL  TROPONIN I    Imaging Review No results found.  Ct Head Wo Contrast  03/23/2014   CLINICAL DATA:  Syncope and Coumadin use.  Initial encounter  EXAM: CT HEAD WITHOUT CONTRAST  TECHNIQUE: Contiguous axial images were obtained from the base of the skull through the vertex without intravenous contrast.  COMPARISON:  07/28/2012  FINDINGS: Skull and Sinuses:Negative for fracture or destructive process. The mastoids, middle ears, and imaged paranasal sinuses are clear.  Orbits: No acute abnormality.  Brain: No evidence of acute abnormality, such as acute infarction, hemorrhage, acute hydrocephalus, or mass lesion/mass effect.  Stable ventriculomegaly which is prominent relative to sulcal enlargement at the vertex, but stable over multiple years. Confluence bilateral cerebral white matter low density is stable compared to 2014 and consistent with moderate chronic small vessel disease.  IMPRESSION: 1. No acute intracranial findings. 2. Chronic changes are stable from 2014 and noted above.   Electronically Signed   By: Jorje Guild M.D.   On: 03/23/2014 01:22     EKG Interpretation   Date/Time:  Sunday March 22 2014 20:38:57 EDT Ventricular Rate:  51 PR Interval:  298 QRS Duration: 87 QT Interval:  442 QTC Calculation: 407 R Axis:   22 Text Interpretation:  Age not entered, assumed to be  80 years old for  purpose of ECG interpretation Sinus rhythm Prolonged PR interval Baseline  wander in lead(s) II III aVF ED PHYSICIAN INTERPRETATION AVAILABLE IN [facility] Confirmed by TEST, Record ([id number]) on 03/24/2014 7:08:37 AM      MDM   Final diagnoses:  Syncope and collapse    80yM with syncope. Bradycardic  in ED. Hx of seizure, but preceding events not consistent with this. Admit for further eval.     Virgel Manifold, MD 03/25/14 2218

## 2014-03-22 NOTE — ED Notes (Signed)
Per EMS, pt had a witnessed syncopal episode. Pt was sitting in a chair when someone called his name and he passed out against the counter. Pt did not fall out of the chair or hit his head. Pt also was bradycardic for EMS with a rat of 45-55 in sinus rhythm. Pt takes digoxin for a-fib. When patient woke up he was confued and had difficulty with his speech since that time pts speech has improved and has no focal weakness.

## 2014-03-22 NOTE — H&P (Signed)
[facility] History and Physical  Patient: Jay Walters  IRC:[id number]  DOB: 09-23-1931  DOS: the patient was seen and examined on 03/22/2014 PCP: Elby Showers, MD  Chief Complaint: Passing out episode  HPI: Jay Walters is a 80 y.o. male with Past medical history of hypertension, dyslipidemia, a flutter, chronic sinus bradycardia, history of seizure disorder, BPH. The patient is presenting with complaints of an episode of unresponsiveness. He was sitting on his inner chair and the family heard a third and he leaned to the side against the wall while sitting. The patient's son helped him lower on the ground at which time he was not responsive for few seconds. The patient gradually turned around and was able to respond to questions appropriately. She was not confused did not lose control of bowel or bladder did not have any tonic-clonic body movement or any focal deficit. There was no recurrence of this symptom and he did not have any similar events in the past. Patient at the time of my evaluation denies any fever, headache, cough, chest pain, palpitation, shortness of breath, orthopnea, PND, nausea, vomiting, abdominal pain, diarrhea, constipation, active bleeding, burning urination, dizziness, pedal edema,  focal neurological deficit.  He was recently seen by urologist for his BPH but denies any changes in his medications.  The patient is coming from home And at his baseline independent for most of his ADL.  Review of Systems: as mentioned in the history of present illness.  A Comprehensive review of the other systems is negative.  Past Medical History  Diagnosis Date  . Dysphagia     with solids  . Esophageal stricture   . Diverticulosis   . Osteopenia   . Hemorrhoids   . High cholesterol   . Heart murmur   . H/O hiatal hernia   . Hypertension   . Atrial flutter     typical appearing by ekg. s/p ablation 07/28/11  . Atrial fibrillation     remotely   . Sinus  bradycardia     asymptomatic  . Stroke 2012    "left me weaker on left side; balance is not good"  . GERD (gastroesophageal reflux disease)   . Seizures     "back w/migraine headaches; 1990's or before"  . Arthritis     "right hip; right knee; lower back ~ 1/2 way across"  . Anemia   . SSS (sick sinus syndrome)    Past Surgical History  Procedure Laterality Date  . Asd repair  1972    "and repaired my aortic valve, I think"  . Knee arthroscopy      right; "maybe twice"  . Inguinal hernia repair  2011    right  . Cardiac electrophysiology study and ablation  07/28/11  . Total knee arthroplasty  07/10/2012    Procedure: TOTAL KNEE ARTHROPLASTY;  Surgeon: Tobi Bastos, MD;  Location: [facility];  Service: Orthopedics;  Laterality: Right;  . Joint replacement      right knee replacement  . Carotid doppler  06/26/2011    Bilateral ICAs-demonstrated normal patency without eivdence of significant diameter reduction, dissection, or any other vascular abnormality.  . Cardiovascular stress test  03/07/2010    Perfusion defect in the inferior myocardial region is consistent with diaphragmatic attenuation. No ischemia or infarct/scar is seen in the remaining myocardium. No ECG changes.  . Transthoracic echocardiogram  12/10/2012    EF 55-60%. No regional wall motion abnormalities. LA moderately dialted.Mild-moderate regurg of the tricuspid valve.   Social  History:  reports that he has never smoked. He has never used smokeless tobacco. He reports that he drinks alcohol. He reports that he does not use illicit drugs.  No Known Allergies  Family History  Problem Relation Age of Onset  . Heart disease Father   . Cancer Sister     unsure what type    Prior to Admission medications   Medication Sig Start Date End Date Taking? Authorizing Provider  amitriptyline (ELAVIL) 10 MG tablet Take 1 tablet (10 mg total) by mouth at bedtime. 12/30/12  Yes Elby Showers, MD  atorvastatin (LIPITOR) 20 MG  tablet Take 1 tablet (20 mg total) by mouth daily. 01/16/14  Yes Elby Showers, MD  doxazosin (CARDURA) 4 MG tablet Take 4 mg by mouth at bedtime.   Yes Historical Provider, MD  ferrous sulfate 325 (65 FE) MG tablet Take 325 mg by mouth 2 (two) times daily after a meal.    Yes Historical Provider, MD  finasteride (PROSCAR) 5 MG tablet Take 1 tablet (5 mg total) by mouth daily. 10/03/13  Yes Elby Showers, MD  gabapentin (NEURONTIN) 300 MG capsule Take 300 mg by mouth 2 (two) times daily.   Yes Historical Provider, MD  Multiple Vitamin (MULTIVITAMIN WITH MINERALS) TABS tablet Take 1 tablet by mouth daily.   Yes Historical Provider, MD  naproxen sodium (ALEVE) 220 MG tablet Take 220 mg by mouth daily as needed (arthritis pain).   Yes Historical Provider, MD  phenytoin (DILANTIN) 100 MG ER capsule Take 300 mg by mouth at bedtime.   Yes Historical Provider, MD  Polyethyl Glycol-Propyl Glycol (SYSTANE ULTRA) 0.4-0.3 % SOLN Place 1 drop into both eyes daily.   Yes Historical Provider, MD  PRESCRIPTION MEDICATION Place 1 drop into both eyes at bedtime. Use for 3 weeks starting on October 2nd.  Samples from Dr. [place]   Yes Historical Provider, MD  tamsulosin (FLOMAX) 0.4 MG CAPS capsule Take 1 capsule (0.4 mg total) by mouth daily. 05/12/13  Yes Elby Showers, MD  warfarin (COUMADIN) 5 MG tablet Take 2.5-5 mg by mouth daily. Take 1/2 tablet (2.5 mg) on Monday and Friday, take 1 tablet (5 mg) on Sunday, Tuesday, Wednesday, Thursday and Saturday   Yes Historical Provider, MD    Physical Exam: Filed Vitals:   03/22/14 2115 03/22/14 2215 03/22/14 2230 03/22/14 2300  BP: 113/52 106/53 105/52 112/83  Pulse: 52 50 53 59  Temp:      TempSrc:      Resp: 22 19 21 19   Height:      Weight:      SpO2: 98% 95% 97% 97%    General: Alert, Awake and Oriented to Time, Place and Person. Appear in mild distress Eyes: PERRL ENT: Oral Mucosa clear moist. Neck: no JVD Cardiovascular: S1 and S2 Present, no Murmur,  Peripheral Pulses Present Respiratory: Bilateral Air entry equal and Decreased, Clear to Auscultation, noCrackles, no wheezes Abdomen: Bowel Sound present, Soft and non tender Skin: no Rash Extremities: no Pedal edema, no calf tenderness Neurologic: Grossly no focal neuro deficit.  Labs on Admission:  CBC:  Recent Labs Lab 03/22/14 2057  WBC 3.2*  NEUTROABS 1.7  HGB 12.5*  HCT 38.0*  MCV 90.5  PLT 147*    CMP     Component Value Date/Time   NA 143 03/22/2014 2057   K 3.9 03/22/2014 2057   CL 104 03/22/2014 2057   CO2 27 03/22/2014 2057   GLUCOSE 175* 03/22/2014 2057  BUN 20 03/22/2014 2057   CREATININE 0.97 03/22/2014 2057   CREATININE 0.92 03/24/2013 1214   CALCIUM 9.1 03/22/2014 2057   PROT 6.7 03/24/2013 1214   ALBUMIN 3.6 03/24/2013 1214   AST 16 03/24/2013 1214   ALT 14 03/24/2013 1214   ALKPHOS 89 03/24/2013 1214   BILITOT 0.3 03/24/2013 1214   GFRNONAA 76* 03/22/2014 2057   GFRAA 88* 03/22/2014 2057    No results found for this basename: LIPASE, AMYLASE,  in the last 168 hours No results found for this basename: AMMONIA,  in the last 168 hours   Recent Labs Lab 03/22/14 2057  TROPONINI <0.30   BNP (last 3 results) No results found for this basename: PROBNP,  in the last 8760 hours  Radiological Exams on Admission: No results found.  EKG: Independently reviewed. normal sinus rhythm, 1st degree AV block.  Assessment/Plan Principal Problem:   Syncope Active Problems:   Seizure disorder   BPH (benign prostatic hyperplasia)   Status post patch closure of ASD   HTN (hypertension)   S/P ablation of atrial flutter   1. Syncope The patient is presenting with complaints of a syncopal episode. The initial event was not witnessed but the patient became unresponsive in presence of some. At the time of my evaluation he does not have any focal deficit. He did hit his head to the wall, CT of the head is unremarkable for any acute abnormality which was  done as he is on Coumadin. With this possibility of syncope being vasovagal versus orthostasis versus cardiac arrhythmia cannot be ruled out. Therefore the patient will be admitted in the hospital. We will monitor him on telemetry. Orthostatic vitals in the morning. At present I would hold his Flomax.  2. BPH Hypertension Patient is on both Flomax and Cardura. Since both of her one block at present I would hold Flomax and continue with Cardura.  3. Hypertension. Continue home medications.  4. Seizure disorder. Phenantoin level within therapeutic range continue home dosing.  Advance goals of care discussion: Full code   DVT Prophylaxis:on chronic anticoagulation Nutrition: Cardiac diet  Disposition: Admitted to observationin telemetry unit.  Author: Berle Mull, MD [facility] Pager: [phone] 03/22/2014, 11:23 PM    If 7PM-7AM, please contact night-coverage [email or web address] Password [email or web address]

## 2014-03-23 ENCOUNTER — Observation Stay (HOSPITAL_COMMUNITY): Payer: 59

## 2014-03-23 ENCOUNTER — Encounter (HOSPITAL_COMMUNITY): Payer: Self-pay | Admitting: Cardiology

## 2014-03-23 DIAGNOSIS — R55 Syncope and collapse: Secondary | ICD-10-CM | POA: Diagnosis not present

## 2014-03-23 DIAGNOSIS — I4892 Unspecified atrial flutter: Secondary | ICD-10-CM | POA: Diagnosis not present

## 2014-03-23 DIAGNOSIS — I10 Essential (primary) hypertension: Secondary | ICD-10-CM | POA: Diagnosis not present

## 2014-03-23 DIAGNOSIS — I359 Nonrheumatic aortic valve disorder, unspecified: Secondary | ICD-10-CM

## 2014-03-23 DIAGNOSIS — I440 Atrioventricular block, first degree: Secondary | ICD-10-CM

## 2014-03-23 LAB — COMPREHENSIVE METABOLIC PANEL
ALT: 30 U/L (ref 0–53)
ANION GAP: 10 (ref 5–15)
AST: 27 U/L (ref 0–37)
Albumin: 3 g/dL — ABNORMAL LOW (ref 3.5–5.2)
Alkaline Phosphatase: 59 U/L (ref 39–117)
BUN: 17 mg/dL (ref 6–23)
CALCIUM: 8.7 mg/dL (ref 8.4–10.5)
CHLORIDE: 108 meq/L (ref 96–112)
CO2: 25 meq/L (ref 19–32)
CREATININE: 0.85 mg/dL (ref 0.50–1.35)
GFR, EST NON AFRICAN AMERICAN: 80 mL/min — AB (ref >90)
GLUCOSE: 86 mg/dL (ref 70–99)
Potassium: 4.3 mEq/L (ref 3.7–5.3)
Sodium: 143 mEq/L (ref 137–147)
Total Protein: 6.2 g/dL (ref 6.0–8.3)

## 2014-03-23 LAB — CBC WITH DIFFERENTIAL/PLATELET
BASOS ABS: 0 10*3/uL (ref 0.0–0.1)
Basophils Relative: 1 % (ref 0–1)
EOS PCT: 1 % (ref 0–5)
Eosinophils Absolute: 0.1 10*3/uL (ref 0.0–0.7)
HEMATOCRIT: 33.8 % — AB (ref 39.0–52.0)
HEMOGLOBIN: 11.2 g/dL — AB (ref 13.0–17.0)
LYMPHS PCT: 36 % (ref 12–46)
Lymphs Abs: 1.5 10*3/uL (ref 0.7–4.0)
MCH: 29.7 pg (ref 26.0–34.0)
MCHC: 33.1 g/dL (ref 30.0–36.0)
MCV: 89.7 fL (ref 78.0–100.0)
MONO ABS: 0.3 10*3/uL (ref 0.1–1.0)
MONOS PCT: 8 % (ref 3–12)
Neutro Abs: 2.2 10*3/uL (ref 1.7–7.7)
Neutrophils Relative %: 54 % (ref 43–77)
Platelets: 143 10*3/uL — ABNORMAL LOW (ref 150–400)
RBC: 3.77 MIL/uL — ABNORMAL LOW (ref 4.22–5.81)
RDW: 14.6 % (ref 11.5–15.5)
WBC: 4.1 10*3/uL (ref 4.0–10.5)

## 2014-03-23 LAB — PROTIME-INR
INR: 3.07 — ABNORMAL HIGH (ref 0.00–1.49)
Prothrombin Time: 31.7 seconds — ABNORMAL HIGH (ref 11.6–15.2)

## 2014-03-23 LAB — GLUCOSE, CAPILLARY: GLUCOSE-CAPILLARY: 180 mg/dL — AB (ref 70–99)

## 2014-03-23 LAB — TROPONIN I

## 2014-03-23 MED ORDER — ONDANSETRON HCL 4 MG PO TABS: 4.0000 mg | ORAL_TABLET | Freq: Four times a day (QID) | ORAL | Status: DC | PRN

## 2014-03-23 MED ORDER — PHENYTOIN SODIUM EXTENDED 100 MG PO CAPS
300.0000 mg | ORAL_CAPSULE | Freq: Every day | ORAL | Status: DC
  Administered 2014-03-23 – 2014-03-24 (×3): 300 mg via ORAL
  Filled 2014-03-23 (×4): qty 3

## 2014-03-23 MED ORDER — ACETAMINOPHEN 650 MG RE SUPP
650.0000 mg | Freq: Four times a day (QID) | RECTAL | Status: DC | PRN
Start: 2014-03-23 — End: 2014-03-25

## 2014-03-23 MED ORDER — FINASTERIDE 5 MG PO TABS
5.0000 mg | ORAL_TABLET | Freq: Every day | ORAL | Status: DC
  Administered 2014-03-23 – 2014-03-25 (×3): 5 mg via ORAL
  Filled 2014-03-23 (×3): qty 1

## 2014-03-23 MED ORDER — FERROUS SULFATE 325 (65 FE) MG PO TABS
325.0000 mg | ORAL_TABLET | Freq: Two times a day (BID) | ORAL | Status: DC
  Administered 2014-03-23 – 2014-03-25 (×5): 325 mg via ORAL
  Filled 2014-03-23 (×7): qty 1

## 2014-03-23 MED ORDER — GABAPENTIN 300 MG PO CAPS
300.0000 mg | ORAL_CAPSULE | Freq: Two times a day (BID) | ORAL | Status: DC
  Administered 2014-03-23 – 2014-03-25 (×6): 300 mg via ORAL
  Filled 2014-03-23 (×7): qty 1

## 2014-03-23 MED ORDER — ONDANSETRON HCL 4 MG/2ML IJ SOLN: 4.0000 mg | Freq: Four times a day (QID) | INTRAMUSCULAR | Status: DC | PRN

## 2014-03-23 MED ORDER — WARFARIN SODIUM 2.5 MG PO TABS
2.5000 mg | ORAL_TABLET | ORAL | Status: DC
  Administered 2014-03-23: 2.5 mg via ORAL
  Filled 2014-03-23: qty 1

## 2014-03-23 MED ORDER — DOXAZOSIN MESYLATE 4 MG PO TABS
4.0000 mg | ORAL_TABLET | Freq: Every day | ORAL | Status: DC
  Administered 2014-03-23: 4 mg via ORAL
  Filled 2014-03-23 (×2): qty 1

## 2014-03-23 MED ORDER — DOXAZOSIN MESYLATE 4 MG PO TABS
4.0000 mg | ORAL_TABLET | Freq: Every day | ORAL | Status: DC
  Administered 2014-03-23 – 2014-03-24 (×2): 4 mg via ORAL
  Filled 2014-03-23 (×3): qty 1

## 2014-03-23 MED ORDER — TAMSULOSIN HCL 0.4 MG PO CAPS
0.4000 mg | ORAL_CAPSULE | Freq: Every day | ORAL | Status: DC
Start: 2014-03-23 — End: 2014-03-23
  Filled 2014-03-23: qty 1

## 2014-03-23 MED ORDER — WARFARIN SODIUM 5 MG PO TABS
5.0000 mg | ORAL_TABLET | ORAL | Status: DC
  Administered 2014-03-24: 5 mg via ORAL
  Filled 2014-03-23 (×2): qty 1

## 2014-03-23 MED ORDER — ATORVASTATIN CALCIUM 20 MG PO TABS
20.0000 mg | ORAL_TABLET | Freq: Every day | ORAL | Status: DC
  Administered 2014-03-23 – 2014-03-25 (×3): 20 mg via ORAL
  Filled 2014-03-23 (×3): qty 1

## 2014-03-23 MED ORDER — AMITRIPTYLINE HCL 10 MG PO TABS
10.0000 mg | ORAL_TABLET | Freq: Every day | ORAL | Status: DC
  Administered 2014-03-23 – 2014-03-24 (×3): 10 mg via ORAL
  Filled 2014-03-23 (×4): qty 1

## 2014-03-23 MED ORDER — SODIUM CHLORIDE 0.9 % IJ SOLN
3.0000 mL | Freq: Two times a day (BID) | INTRAMUSCULAR | Status: DC
  Administered 2014-03-23 – 2014-03-25 (×4): 3 mL via INTRAVENOUS

## 2014-03-23 MED ORDER — ACETAMINOPHEN 325 MG PO TABS
650.0000 mg | ORAL_TABLET | Freq: Four times a day (QID) | ORAL | Status: DC | PRN
  Filled 2014-03-23: qty 2

## 2014-03-23 MED ORDER — WARFARIN - PHARMACIST DOSING INPATIENT: Freq: Every day | Status: DC

## 2014-03-23 NOTE — Progress Notes (Signed)
[facility] PROGRESS NOTE  Jay Walters ZOX:[id number] DOB: 1931/12/04 DOA: 03/22/2014 PCP: Elby Showers, MD  Assessment/Plan: 1. Syncope -likely orthostatic or cardiac -is on 2 alpha blockers (FLomax and Cardura) for BPH but hes unsure whether hes taking them both, have asked Pharmacy to review his meds again -Orthostatic vitals surprisingly normal -Monitor on tele, check ECHo -will ask cards to eval -hold flomax, continue cardura for now -Ambulate, Physical therapy  2. H/o pAfib and Aflutter/bradycardia -continue coumadin, HR in 50s  3. BPH  -see above -hold flomax, continue cardura and proscar  4. H/o seizure disorder -continue dilantin, level therapeutic -history not suggestive of seizure at all  5. H/o CVA -continue coumadin  DVt proph: on anticoagulation  Code Status: Full Code Family Communication: none at bedside Disposition Plan: home pending workup   Consultants:  Cards pending  HPI/Subjective: Feels well, no complaints  Objective: Filed Vitals:   03/23/14 0400  BP: 120/60  Pulse: 51  Temp: 98.6 F (37 C)  Resp: 18    Intake/Output Summary (Last 24 hours) at 03/23/14 1128 Last data filed at 03/23/14 1000  Gross per 24 hour  Intake    100 ml  Output    350 ml  Net   -250 ml   Filed Weights   03/22/14 2043 03/23/14 0204  Weight: 61.236 kg (135 lb) 60.147 kg (132 lb 9.6 oz)    Exam:   General:  AAOx3  Cardiovascular: S1S2/RRR  Respiratory: CTAB  Abdomen: soft, NT, BS present  Musculoskeletal: no edema c/c   Data Reviewed: Basic Metabolic Panel:  Recent Labs Lab 03/22/14 2057 03/23/14 0715  NA 143 143  K 3.9 4.3  CL 104 108  CO2 27 25  GLUCOSE 175* 86  BUN 20 17  CREATININE 0.97 0.85  CALCIUM 9.1 8.7   Liver Function Tests:  Recent Labs Lab 03/23/14 0715  AST 27  ALT 30  ALKPHOS 59  BILITOT <0.2*  PROT 6.2  ALBUMIN 3.0*   No results found for this basename: LIPASE, AMYLASE,  in the last 168  hours No results found for this basename: AMMONIA,  in the last 168 hours CBC:  Recent Labs Lab 03/22/14 2057 03/23/14 0715  WBC 3.2* 4.1  NEUTROABS 1.7 2.2  HGB 12.5* 11.2*  HCT 38.0* 33.8*  MCV 90.5 89.7  PLT 147* 143*   Cardiac Enzymes:  Recent Labs Lab 03/22/14 2057 03/23/14 0715  TROPONINI <0.30 <0.30   BNP (last 3 results) No results found for this basename: PROBNP,  in the last 8760 hours CBG:  Recent Labs Lab 03/22/14 2042  GLUCAP 180*    No results found for this or any previous visit (from the past 240 hour(s)).   Studies: Ct Head Wo Contrast  03/23/2014   CLINICAL DATA:  Syncope and Coumadin use.  Initial encounter  EXAM: CT HEAD WITHOUT CONTRAST  TECHNIQUE: Contiguous axial images were obtained from the base of the skull through the vertex without intravenous contrast.  COMPARISON:  07/28/2012  FINDINGS: Skull and Sinuses:Negative for fracture or destructive process. The mastoids, middle ears, and imaged paranasal sinuses are clear.  Orbits: No acute abnormality.  Brain: No evidence of acute abnormality, such as acute infarction, hemorrhage, acute hydrocephalus, or mass lesion/mass effect.  Stable ventriculomegaly which is prominent relative to sulcal enlargement at the vertex, but stable over multiple years. Confluence bilateral cerebral white matter low density is stable compared to 2014 and consistent with moderate chronic small vessel disease.  IMPRESSION: 1. No acute  intracranial findings. 2. Chronic changes are stable from 2014 and noted above.   Electronically Signed   By: Jorje Guild M.D.   On: 03/23/2014 01:22    Scheduled Meds: . amitriptyline  10 mg Oral QHS  . atorvastatin  20 mg Oral Daily  . ferrous sulfate  325 mg Oral BID PC  . finasteride  5 mg Oral Daily  . gabapentin  300 mg Oral BID  . phenytoin  300 mg Oral QHS  . sodium chloride  3 mL Intravenous Q12H  . tamsulosin  0.4 mg Oral QPC supper  . warfarin  2.5 mg Oral Once per day on  Mon Fri  . [START ON 03/24/2014] warfarin  5 mg Oral Once per day on Sun Tue Wed Thu Sat  . Warfarin - Pharmacist Dosing Inpatient   Does not apply q1800   Continuous Infusions:  Antibiotics Given (last 72 hours)   None      Principal Problem:   Syncope Active Problems:   Seizure disorder   BPH (benign prostatic hyperplasia)   Status post patch closure of ASD   HTN (hypertension)   S/P ablation of atrial flutter    Time spent: 79min    [name]  [facility] Pager [phone]. If 7PM-7AM, please contact night-coverage at [email or web address], password [email or web address] 03/23/2014, 11:28 AM  LOS: 1 day

## 2014-03-23 NOTE — Consult Note (Signed)
Reason for Consult: syncope   Referring Physician:  Dr. Jacinta Shoe PCP:  Elby Showers, MD Primary Cardiologist:Dr. Gara Kroner [name] is an 80 y.o. male.    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 He was previously followed by Dr. Rollene Fare. At that time, he was seen and was in atrial flutter and felt to be potentially a candidate for A-flutter ablation due to his bradycardia; however, the timing was such that it was recommended that this would be postponed until after he has recovered. He also has a history of a remote ASD repair and was on Coumadin in the past but has not been on that recently. He was restarted on Coumadin after there was time to allow for recovery from the stroke. He did have a CT angiogram of the neck which showed question of thrombus or atherosclerotic plaque at the carotid bifurcation and an ultrasound of the neck was repeated in our office on June 26, 2011, which demonstrated no significant carotid stenosis.  He also has HTN, dyslipidemia, hx of seizure disorder, and BPH.   He was admitted 03/22/14 with complaints from family of unresponsiveness. He was sitting on his inner chair yesterday and the family heard a thump and he leaned to the side against the wall while sitting. The patient's son helped him lower on the ground at which time he was not responsive for few seconds. The patient gradually turned around and was able to respond to questions appropriately. He was not confused, did not lose control of bowel or bladder, did not have any tonic-clonic body movement or any focal deficit.   There was no recurrence of this symptom and he did not have any similar events in the past. Pt does not remember passing out, only feeling tired.   EKG: S Brady rate of 51 PR 298, 1st degree AV block. OLD EKG 01/01/14   with HR 43 and PR 333 ms. Non specific t wave changes. Troponin <0.30 X 2 INR 2.90 and today 3.07 CT of his head without acute changes.   Currently no complaints, no chest pain yesterday or today and no SOB.  HR on tele as low as 49.    Past Medical History  Diagnosis Date  . Dysphagia     with solids  . Esophageal stricture   . Diverticulosis   . Osteopenia   . Hemorrhoids   . High cholesterol   . Heart murmur   . H/O hiatal hernia   . Hypertension   . Atrial flutter     typical appearing by ekg. s/p ablation 07/28/11  . Atrial fibrillation     remotely   . Sinus bradycardia     asymptomatic  . Stroke 2012    "left me weaker on left side; balance is not good"  . GERD (gastroesophageal reflux disease)   . Seizures     "back w/migraine headaches; 1990's or before"  . Arthritis     "right hip; right knee; lower back ~ 1/2 way across"  . Anemia   . SSS (sick sinus syndrome)     Past Surgical History  Procedure Laterality Date  . Asd repair  1972    "and repaired my aortic valve, I think"  . Knee arthroscopy      right; "maybe twice"  . Inguinal hernia  repair  2011    right  . Cardiac electrophysiology study and ablation  07/28/11  . Total knee arthroplasty  07/10/2012    Procedure: TOTAL KNEE ARTHROPLASTY;  Surgeon: Tobi Bastos, MD;  Location: [facility];  Service: Orthopedics;  Laterality: Right;  . Joint replacement      right knee replacement  . Carotid doppler  06/26/2011    Bilateral ICAs-demonstrated normal patency without eivdence of significant diameter reduction, dissection, or any other vascular abnormality.  . Cardiovascular stress test  03/07/2010    Perfusion defect in the inferior myocardial region is consistent with diaphragmatic attenuation. No ischemia or infarct/scar is seen in the remaining myocardium. No ECG changes.  . Transthoracic echocardiogram  12/10/2012    EF 55-60%. No regional wall motion abnormalities. LA moderately dialted.Mild-moderate  regurg of the tricuspid valve.    Family History  Problem Relation Age of Onset  . Heart disease Father   . Cancer Sister     unsure what type   Social History:  reports that he has never smoked. He has never used smokeless tobacco. He reports that he drinks alcohol. He reports that he does not use illicit drugs.  Allergies: No Known Allergies  Medications Prior to Admission  Medication Sig Dispense Refill  . amitriptyline (ELAVIL) 10 MG tablet Take 1 tablet (10 mg total) by mouth at bedtime.  30 tablet  5  . atorvastatin (LIPITOR) 20 MG tablet Take 1 tablet (20 mg total) by mouth daily.  90 tablet  3  . bimatoprost (LUMIGAN) 0.03 % ophthalmic solution Place 1 drop into both eyes at bedtime. For 3 weeks, then return for a check up. Starting on 03/13/2014.      Marland Kitchen doxazosin (CARDURA) 4 MG tablet Take 4 mg by mouth at bedtime.      . ferrous sulfate 325 (65 FE) MG tablet Take 325 mg by mouth 2 (two) times daily after a meal.       . finasteride (PROSCAR) 5 MG tablet Take 1 tablet (5 mg total) by mouth daily.  30 tablet  11  . gabapentin (NEURONTIN) 300 MG capsule Take 300 mg by mouth 2 (two) times daily.      . Multiple Vitamin (MULTIVITAMIN WITH MINERALS) TABS tablet Take 1 tablet by mouth daily.      . naproxen sodium (ALEVE) 220 MG tablet Take 220 mg by mouth daily as needed (arthritis pain).      . phenytoin (DILANTIN) 100 MG ER capsule Take 300 mg by mouth at bedtime.      Vladimir Faster Glycol-Propyl Glycol (SYSTANE ULTRA) 0.4-0.3 % SOLN Place 1 drop into both eyes daily.      . tamsulosin (FLOMAX) 0.4 MG CAPS capsule Take 1 capsule (0.4 mg total) by mouth daily.  90 capsule  3  . warfarin (COUMADIN) 5 MG tablet Take 2.5-5 mg by mouth daily. Take 1/2 tablet (2.5 mg) on Monday and Friday, take 1 tablet (5 mg) on Sunday, Tuesday, Wednesday, Thursday and Saturday        Results for orders placed during the hospital encounter of 03/22/14 (from the past 48 hour(s))  GLUCOSE, CAPILLARY      Status: Abnormal   Collection Time    03/22/14  8:42 PM      Result Value Ref Range   Glucose-Capillary 180 (*) 70 - 99 mg/dL  DIGOXIN LEVEL     Status: Abnormal   Collection Time    03/22/14  8:57 PM  Result Value Ref Range   Digoxin Level <0.3 (*) 0.8 - 2.0 ng/mL  CBC WITH DIFFERENTIAL     Status: Abnormal   Collection Time    03/22/14  8:57 PM      Result Value Ref Range   WBC 3.2 (*) 4.0 - 10.5 K/uL   RBC 4.20 (*) 4.22 - 5.81 MIL/uL   Hemoglobin 12.5 (*) 13.0 - 17.0 g/dL   HCT 38.0 (*) 39.0 - 52.0 %   MCV 90.5  78.0 - 100.0 fL   MCH 29.8  26.0 - 34.0 pg   MCHC 32.9  30.0 - 36.0 g/dL   RDW 14.6  11.5 - 15.5 %   Platelets 147 (*) 150 - 400 K/uL   Neutrophils Relative % 51  43 - 77 %   Neutro Abs 1.7  1.7 - 7.7 K/uL   Lymphocytes Relative 39  12 - 46 %   Lymphs Abs 1.2  0.7 - 4.0 K/uL   Monocytes Relative 8  3 - 12 %   Monocytes Absolute 0.2  0.1 - 1.0 K/uL   Eosinophils Relative 1  0 - 5 %   Eosinophils Absolute 0.0  0.0 - 0.7 K/uL   Basophils Relative 1  0 - 1 %   Basophils Absolute 0.0  0.0 - 0.1 K/uL  BASIC METABOLIC PANEL     Status: Abnormal   Collection Time    03/22/14  8:57 PM      Result Value Ref Range   Sodium 143  137 - 147 mEq/L   Potassium 3.9  3.7 - 5.3 mEq/L   Chloride 104  96 - 112 mEq/L   CO2 27  19 - 32 mEq/L   Glucose, Bld 175 (*) 70 - 99 mg/dL   BUN 20  6 - 23 mg/dL   Creatinine, Ser 0.97  0.50 - 1.35 mg/dL   Calcium 9.1  8.4 - 10.5 mg/dL   GFR calc non Af Amer 76 (*) >90 mL/min   GFR calc Af Amer 88 (*) >90 mL/min   Comment: (NOTE)     The eGFR has been calculated using the CKD EPI equation.     This calculation has not been validated in all clinical situations.     eGFR's persistently <90 mL/min signify possible Chronic Kidney     Disease.   Anion gap 12  5 - 15  TROPONIN I     Status: None   Collection Time    03/22/14  8:57 PM      Result Value Ref Range   Troponin I <0.30  <0.30 ng/mL   Comment:            Due to the release  kinetics of cTnI,     a negative result within the first hours     of the onset of symptoms does not rule out     myocardial infarction with certainty.     If myocardial infarction is still suspected,     repeat the test at appropriate intervals.  PROTIME-INR     Status: Abnormal   Collection Time    03/22/14  9:02 PM      Result Value Ref Range   Prothrombin Time 30.3 (*) 11.6 - 15.2 seconds   INR 2.90 (*) 0.00 - 1.49  PHENYTOIN LEVEL, TOTAL     Status: None   Collection Time    03/22/14  9:02 PM      Result Value Ref Range   Phenytoin Lvl  13.5  10.0 - 20.0 ug/mL  PROTIME-INR     Status: Abnormal   Collection Time    03/23/14  3:50 AM      Result Value Ref Range   Prothrombin Time 31.7 (*) 11.6 - 15.2 seconds   INR 3.07 (*) 0.00 - 1.49  CBC WITH DIFFERENTIAL     Status: Abnormal   Collection Time    03/23/14  7:15 AM      Result Value Ref Range   WBC 4.1  4.0 - 10.5 K/uL   RBC 3.77 (*) 4.22 - 5.81 MIL/uL   Hemoglobin 11.2 (*) 13.0 - 17.0 g/dL   HCT 33.8 (*) 39.0 - 52.0 %   MCV 89.7  78.0 - 100.0 fL   MCH 29.7  26.0 - 34.0 pg   MCHC 33.1  30.0 - 36.0 g/dL   RDW 14.6  11.5 - 15.5 %   Platelets 143 (*) 150 - 400 K/uL   Neutrophils Relative % 54  43 - 77 %   Neutro Abs 2.2  1.7 - 7.7 K/uL   Lymphocytes Relative 36  12 - 46 %   Lymphs Abs 1.5  0.7 - 4.0 K/uL   Monocytes Relative 8  3 - 12 %   Monocytes Absolute 0.3  0.1 - 1.0 K/uL   Eosinophils Relative 1  0 - 5 %   Eosinophils Absolute 0.1  0.0 - 0.7 K/uL   Basophils Relative 1  0 - 1 %   Basophils Absolute 0.0  0.0 - 0.1 K/uL  COMPREHENSIVE METABOLIC PANEL     Status: Abnormal   Collection Time    03/23/14  7:15 AM      Result Value Ref Range   Sodium 143  137 - 147 mEq/L   Potassium 4.3  3.7 - 5.3 mEq/L   Chloride 108  96 - 112 mEq/L   CO2 25  19 - 32 mEq/L   Glucose, Bld 86  70 - 99 mg/dL   BUN 17  6 - 23 mg/dL   Creatinine, Ser 0.85  0.50 - 1.35 mg/dL   Calcium 8.7  8.4 - 10.5 mg/dL   Total Protein 6.2  6.0  - 8.3 g/dL   Albumin 3.0 (*) 3.5 - 5.2 g/dL   AST 27  0 - 37 U/L   ALT 30  0 - 53 U/L   Alkaline Phosphatase 59  39 - 117 U/L   Total Bilirubin <0.2 (*) 0.3 - 1.2 mg/dL   GFR calc non Af Amer 80 (*) >90 mL/min   GFR calc Af Amer >90  >90 mL/min   Comment: (NOTE)     The eGFR has been calculated using the CKD EPI equation.     This calculation has not been validated in all clinical situations.     eGFR's persistently <90 mL/min signify possible Chronic Kidney     Disease.   Anion gap 10  5 - 15  TROPONIN I     Status: None   Collection Time    03/23/14  7:15 AM      Result Value Ref Range   Troponin I <0.30  <0.30 ng/mL   Comment:            Due to the release kinetics of cTnI,     a negative result within the first hours     of the onset of symptoms does not rule out     myocardial infarction with certainty.     If  myocardial infarction is still suspected,     repeat the test at appropriate intervals.   Ct Head Wo Contrast  03/23/2014   CLINICAL DATA:  Syncope and Coumadin use.  Initial encounter  EXAM: CT HEAD WITHOUT CONTRAST  TECHNIQUE: Contiguous axial images were obtained from the base of the skull through the vertex without intravenous contrast.  COMPARISON:  07/28/2012  FINDINGS: Skull and Sinuses:Negative for fracture or destructive process. The mastoids, middle ears, and imaged paranasal sinuses are clear.  Orbits: No acute abnormality.  Brain: No evidence of acute abnormality, such as acute infarction, hemorrhage, acute hydrocephalus, or mass lesion/mass effect.  Stable ventriculomegaly which is prominent relative to sulcal enlargement at the vertex, but stable over multiple years. Confluence bilateral cerebral white matter low density is stable compared to 2014 and consistent with moderate chronic small vessel disease.  IMPRESSION: 1. No acute intracranial findings. 2. Chronic changes are stable from 2014 and noted above.   Electronically Signed   By: Jorje Guild M.D.    On: 03/23/2014 01:22    ROS: General:no colds or fevers, no weight changes Skin:no rashes or ulcers HEENT:no blurred vision, no congestion CV:see HPI PUL:see HPI GI:no diarrhea constipation or melena, no indigestion GU:no hematuria, no dysuria MS:no joint pain, no claudication Neuro:+ syncope per family not pt, no lightheadedness Endo:no diabetes, no thyroid disease   Blood pressure 114/54, pulse 46, temperature 98.6 F (37 C), temperature source Oral, resp. rate 18, height 5\' 8"  (1.727 m), weight 132 lb 9.6 oz (60.147 kg), SpO2 99.00%. PE: General:Pleasant affect, NAD Skin:Warm and dry, brisk capillary refill HEENT:normocephalic, sclera clear, mucus membranes moist Neck:supple, no JVD, ? Rt bruit, no adenopathy, no thyromegaly   Heart:S1S2 RRR without murmur, gallup, rub or click Lungs:clear without rales, rhonchi, or wheezes WCH:ENID, non tender, + BS, do not palpate liver spleen or masses Ext:tr lower ext edema at ankles, 2+ pedal pulses, 2+ radial pulses Neuro:alert and oriented X 3, MAE, follows commands, + facial symmetry, tongue midline, grips and push pull equal.     Assessment/Plan Principal Problem:   Syncope- CT head negative, troponin Negative, may have been slower HR that created the problem.  Outpatient cardionet.  Last stress test 2011 but no chest pain and neg. Troponin.  For echo today. Last echo Ef 55-60% mild LVH, mild concentric hypertrophy then . Have only seen SR-SB on monitor. Active Problems:   Seizure disorder- no seizure per his son.   BPH (benign prostatic hyperplasia) on cardura and flomax as outpatient   Status post patch closure of ASD   HTN (hypertension)   S/P ablation of atrial flutter   Maintains SB rates 43- 51 without symptoms. 1st degree AV block.    [place] Practitioner Certified [place] Pager [phone] or after 5pm or weekends call [phone] 03/23/2014, 12:06 PM    Attending Note:   The  patient was seen and examined.  Agree with assessment and plan as noted above.  Changes made to the above note as needed.  Pt has a hx of instability in the past from a CVA. He presents with an episode of LOC - his son thought that he had passed out.   Pt has no memory of this.   He has bradycardia at baseline and a 1st degree AV block.  No evidence of advanced heart block on tele.  He should be stable for DC.  He  should get a 30 day event monitor .  Follow  up with Dr. Debara Pickett.   Thayer Headings, Brooke Bonito., MD, [facility] 03/23/2014, 1:16 PM [address]  [place] Pager [phone]

## 2014-03-23 NOTE — Progress Notes (Signed)
Echo Lab  2D Echocardiogram completed.  [place], RDCS 03/23/2014 2:53 PM

## 2014-03-23 NOTE — Progress Notes (Signed)
ANTICOAGULATION CONSULT NOTE - Initial Consult  Pharmacy Consult for Coumadin Indication: Aflutter  No Known Allergies  Patient Measurements: Height: 5\' 5"  (165.1 cm) Weight: 135 lb (61.236 kg) IBW/kg (Calculated) : 61.5  Vital Signs: Temp: 98.2 F (36.8 C) (10/11 2043) Temp Source: Oral (10/11 2043) BP: 130/57 mmHg (10/12 0000) Pulse Rate: 54 (10/12 0000)  Labs:  Recent Labs  03/22/14 2057 03/22/14 2102  HGB 12.5*  --   HCT 38.0*  --   PLT 147*  --   LABPROT  --  30.3*  INR  --  2.90*  CREATININE 0.97  --   TROPONINI <0.30  --     Estimated Creatinine Clearance: 52.6 ml/min (by C-G formula based on Cr of 0.97).   Medical History: Past Medical History  Diagnosis Date  . Dysphagia     with solids  . Esophageal stricture   . Diverticulosis   . Osteopenia   . Hemorrhoids   . High cholesterol   . Heart murmur   . H/O hiatal hernia   . Hypertension   . Atrial flutter     typical appearing by ekg. s/p ablation 07/28/11  . Atrial fibrillation     remotely   . Sinus bradycardia     asymptomatic  . Stroke 2012    "left me weaker on left side; balance is not good"  . GERD (gastroesophageal reflux disease)   . Seizures     "back w/migraine headaches; 1990's or before"  . Arthritis     "right hip; right knee; lower back ~ 1/2 way across"  . Anemia   . SSS (sick sinus syndrome)     Assessment: 80yo male had witnessed syncopal episode (did not hit head), found by EMS to be bradycardic, to continue Coumadin during admission; current INR therapeutic w/ last dose PTA 10/11.  Goal of Therapy:  INR 2-3   Plan:  Will continue home Coumadin dose of 5mg  daily except 2.5mg  MonFri and monitor INR for dose adjustments.  Wynona Neat, PharmD, BCPS  03/23/2014,1:19 AM

## 2014-03-23 NOTE — Progress Notes (Signed)
UR completed 

## 2014-03-23 NOTE — Evaluation (Addendum)
Physical Therapy Evaluation Patient Details Name: Uzoma Vivona MRN: [id number] DOB: [birth year] Today's Date: 03/23/2014   History of Present Illness  Patient is an 80 yo male admitted 03/22/14 following syncopal event, hitting posterior head with fall.  Patient with HR in 43-51 bpm range.  PMH:  HTN, A-flutter, Afib, bradycardia, seizures, CVA with Lt hemiparesis and aphasia, s/p Rt TKA.   Clinical Impression   Patient presents with general weakness and decreased balance, impacting mobility and safety.  Patient will benefit from acute PT to address these problem areas and maximize independence.   Patient has h/o 4 falls in last several months.  Today, patient required assist to maintain balance in sitting, standing, and with ambulation, to prevent falls.  Patient's wife works a full time and a part time job, and son works.  Patient is home alone for a majority of the day.  And there are 13 steps to go upstairs to a bedroom.  At this point, do not feel patient is safe to be at home alone.  Would recommend ST-SNF at discharge for continued therapy to reach goal of Mod I level prior to return home.    Follow Up Recommendations SNF;Supervision/Assistance - 24 hour    Equipment Recommendations  None recommended by PT    Recommendations for Other Services       Precautions / Restrictions Precautions Precautions: Fall Precaution Comments: Per patient and son, patient has had 4 falls in last few months. Restrictions Weight Bearing Restrictions: No      Mobility  Bed Mobility Overal bed mobility: Needs Assistance Bed Mobility: Supine to Sit;Sit to Supine     Supine to sit: Min assist Sit to supine: Min guard   General bed mobility comments: Patient able to move toward EOB - verbal cues for technique.  Assist with moving trunk to upright position.  Decreased balance in sitting, with posterior lean, requiring assist to maintain balance.  Transfers Overall transfer level: Needs  assistance Equipment used: Rolling walker (2 wheeled) Transfers: Sit to/from Stand Sit to Stand: Min assist         General transfer comment: Verbal cues for hand placement.  Assist to rise to standing.  Once standing, requires assist for standing balance, with posterior lean.    Ambulation/Gait Ambulation/Gait assistance: Min assist Ambulation Distance (Feet): 20 Feet Assistive device: Rolling walker (2 wheeled) Gait Pattern/deviations: Step-through pattern;Decreased step length - right;Decreased step length - left;Decreased stride length;Shuffle;Leaning posteriorly;Trunk flexed Gait velocity: Decreased Gait velocity interpretation: Below normal speed for age/gender General Gait Details: Verbal cues for safe use of RW.  Patient with flexed posture, and leaning posteriorly.  Difficulty picking up feet to take steps. Shuffling, with feet "catching" on floor at times.  Cues to take longer steps.  Assist to maneuver RW for safety.  Assist for balance.  Stairs            Wheelchair Mobility    Modified Rankin (Stroke Patients Only)       Balance Overall balance assessment: Needs assistance Sitting-balance support: Single extremity supported;Feet supported Sitting balance-Leahy Scale: Fair Sitting balance - Comments: Patient able to maintain balance for 15-20 seconds.  Loses balance posteriorly with any movement - looking up, raising UE or LE.  Assist to regain balance. Postural control: Posterior lean Standing balance support: Bilateral upper extremity supported Standing balance-Leahy Scale: Poor Standing balance comment: Patient initially leaning posteriorly.  Required use of BUE's on RW to maintain balance.  Pertinent Vitals/Pain Pain Assessment: No/denies pain    Home Living Family/patient expects to be discharged to:: Private residence Living Arrangements: Spouse/significant other;Children (Wife and Son) Available Help at  Discharge: Family;Available PRN/intermittently (Wife works full-time and part-time jobs. Son works full time) Type of Home: House Home Access: Stairs to enter     Home Layout: Two level;Bed/bath upstairs;1/2 bath on main level Home Equipment: Environmental consultant - 2 wheels;Cane - single point;Wheelchair - manual;Shower seat      Prior Function Level of Independence: Independent with assistive device(s)         Comments: Patient is home alone most of the day.  Was able to move about with RW.  Has had 4 falls recently.     Hand Dominance   Dominant Hand: Left (For writing and eating.  Otherwise uses both hands evenly.)    Extremity/Trunk Assessment   Upper Extremity Assessment: Generalized weakness           Lower Extremity Assessment: Generalized weakness;RLE deficits/detail;LLE deficits/detail RLE Deficits / Details: Decreased strength - patient reports Rt knee buckles during gait.  s/p Rt TKA. LLE Deficits / Details: Strength grossly 4/5     Communication   Communication: No difficulties  Cognition Arousal/Alertness: Awake/alert Behavior During Therapy: WFL for tasks assessed/performed Overall Cognitive Status: Difficult to assess Area of Impairment: Memory;Safety/judgement;Problem solving     Memory: Decreased short-term memory   Safety/Judgement: Decreased awareness of safety   Problem Solving: Slow processing General Comments: Patient having difficulty recalling events of prior day.  Son correcting patient's information.    General Comments      Exercises        Assessment/Plan    PT Assessment Patient needs continued PT services  PT Diagnosis Difficulty walking;Abnormality of gait;Generalized weakness   PT Problem List Decreased strength;Decreased activity tolerance;Decreased balance;Decreased mobility;Decreased safety awareness;Decreased knowledge of use of DME  PT Treatment Interventions DME instruction;Gait training;Stair training;Functional mobility  training;Therapeutic activities;Balance training;Patient/family education   PT Goals (Current goals can be found in the Care Plan section) Acute Rehab PT Goals Patient Stated Goal: To go home PT Goal Formulation: With patient/family Time For Goal Achievement: 03/30/14 Potential to Achieve Goals: Good    Frequency Min 3X/week   Barriers to discharge Inaccessible home environment;Decreased caregiver support Patient's wife and son work - patient is home alone for most of the day.  Bedroom is upstairs (13 steps).    Co-evaluation               End of Session Equipment Utilized During Treatment: Gait belt Activity Tolerance: Patient limited by fatigue Patient left: in bed;with call bell/phone within reach;with family/visitor present Nurse Communication: Mobility status    Functional Assessment Tool Used: Clinical judgement Functional Limitation: Mobility: Walking and moving around Mobility: Walking and Moving Around Current Status ([id number]): At least 20 percent but less than 40 percent impaired, limited or restricted Mobility: Walking and Moving Around Goal Status [phone]): At least 1 percent but less than 20 percent impaired, limited or restricted    Time: 1452-1528 PT Time Calculation (min): 36 min   Charges:   PT Evaluation $Initial PT Evaluation Tier I: 1 Procedure PT Treatments $Gait Training: 8-22 mins $Therapeutic Activity: 8-22 mins   PT G Codes:   Functional Assessment Tool Used: Clinical judgement Functional Limitation: Mobility: Walking and moving around    Despina Pole 03/23/2014, 4:12 PM Carita Pian. Sanjuana Kava, [place] Pager [phone]

## 2014-03-24 ENCOUNTER — Encounter (HOSPITAL_COMMUNITY): Payer: Self-pay | Admitting: Physician Assistant

## 2014-03-24 ENCOUNTER — Other Ambulatory Visit: Payer: Self-pay | Admitting: Physician Assistant

## 2014-03-24 DIAGNOSIS — I440 Atrioventricular block, first degree: Secondary | ICD-10-CM | POA: Diagnosis not present

## 2014-03-24 DIAGNOSIS — I4892 Unspecified atrial flutter: Secondary | ICD-10-CM

## 2014-03-24 DIAGNOSIS — Z7901 Long term (current) use of anticoagulants: Secondary | ICD-10-CM

## 2014-03-24 DIAGNOSIS — R55 Syncope and collapse: Secondary | ICD-10-CM

## 2014-03-24 DIAGNOSIS — Z9889 Other specified postprocedural states: Secondary | ICD-10-CM

## 2014-03-24 LAB — PROTIME-INR
INR: 2.68 — ABNORMAL HIGH (ref 0.00–1.49)
Prothrombin Time: 28.5 seconds — ABNORMAL HIGH (ref 11.6–15.2)

## 2014-03-24 NOTE — Progress Notes (Signed)
ANTICOAGULATION CONSULT NOTE - Follow Up Consult  Pharmacy Consult for Coumadin Indication: Aflutter  No Known Allergies  Patient Measurements: Height: 5\' 8"  (172.7 cm) Weight: 132 lb 4.4 oz (60 kg) IBW/kg (Calculated) : 68.4  Vital Signs: Temp: 98.4 F (36.9 C) (10/13 0740) Temp Source: Oral (10/13 0740) BP: 128/62 mmHg (10/13 0740) Pulse Rate: 50 (10/13 0740)  Labs:  Recent Labs  03/22/14 2057 03/22/14 2102 03/23/14 0350 03/23/14 0715 03/24/14 0347  HGB 12.5*  --   --  11.2*  --   HCT 38.0*  --   --  33.8*  --   PLT 147*  --   --  143*  --   LABPROT  --  30.3* 31.7*  --  28.5*  INR  --  2.90* 3.07*  --  2.68*  CREATININE 0.97  --   --  0.85  --   TROPONINI <0.30  --   --  <0.30  --     Estimated Creatinine Clearance: 58.8 ml/min (by C-G formula based on Cr of 0.85).   Medical History: Past Medical History  Diagnosis Date  . Dysphagia     with solids  . Esophageal stricture   . Diverticulosis   . Osteopenia   . Hemorrhoids   . High cholesterol   . Heart murmur   . H/O hiatal hernia   . Hypertension   . Atrial flutter     typical appearing by ekg. s/p ablation 07/28/11  . Atrial fibrillation     remotely   . Sinus bradycardia     asymptomatic  . Stroke 2012    "left me weaker on left side; balance is not good"  . GERD (gastroesophageal reflux disease)   . Seizures     "back w/migraine headaches; 1990's or before"  . Arthritis     "right hip; right knee; lower back ~ 1/2 way across"  . Anemia   . SSS (sick sinus syndrome)     Assessment: 80yo male had witnessed syncopal episode (did not hit head), found by EMS to be bradycardic, to continue Coumadin during admission; INR remains therapeutic on home dose of Coumadin.   Goal of Therapy:  INR 2-3   Plan:  Will continue home Coumadin dose of 5mg  daily except 2.5mg  MonFri and monitor INR for dose adjustments.  Albertina Parr, PharmD.  Clinical Pharmacist Pager [phone]

## 2014-03-24 NOTE — Progress Notes (Signed)
UR completed 

## 2014-03-24 NOTE — Progress Notes (Addendum)
Clinical Social Work Department CLINICAL SOCIAL WORK PLACEMENT NOTE 03/24/2014  Patient:  Jay Walters, Jay Walters  Account Number:  [id number] Admit date:  03/22/2014  Clinical Social Worker:  Berton Mount, Latanya Presser  Date/time:  03/24/2014 12:00 N  Clinical Social Work is seeking post-discharge placement for this patient at the following level of care:   SKILLED NURSING   (*CSW will update this form in Epic as items are completed)   03/24/2014  Patient/family provided with [place] Department of Clinical Social Work's list of facilities offering this level of care within the geographic area requested by the patient (or if unable, by the patient's family).  03/24/2014  Patient/family informed of their freedom to choose among providers that offer the needed level of care, that participate in Medicare, Medicaid or managed care program needed by the patient, have an available bed and are willing to accept the patient.  03/24/2014  Patient/family informed of [facility]' ownership interest in [facility], as well as of the fact that they are under no obligation to receive care at this facility.  PASARR submitted to EDS on  PASARR number received on   FL2 transmitted to all facilities in geographic area requested by pt/family on  03/24/2014 FL2 transmitted to all facilities within larger geographic area on   Patient informed that his/her managed care company has contracts with or will negotiate with  certain facilities, including the following:     Patient/family informed of bed offers received:  03/24/2014 Patient chooses bed at -- Physician recommends and patient chooses bed at    Patient to be transferred to  HOME on   Patient to be transferred to facility by  Patient and family notified of transfer on  Name of family member notified:    The following physician request were entered in Epic: Physician Request  Please sign FL2.    Additional Comments: 03/24/14-12:28pm:  PASARR system down unable to submit   [place], [place]

## 2014-03-24 NOTE — Discharge Summary (Addendum)
Physician Discharge Summary  [facility] IDP:[id number] DOB: April 08, 1932 DOA: 03/22/2014  PCP: Elby Showers, MD  Admit date: 03/22/2014 Discharge date: 03/24/2014  Time spent:45 minutes  Recommendations for Outpatient Follow-up:  1. Event monitor Set up per Cardiology, office will call him 2. Dr.Baxley in 1 week  Discharge Diagnoses:  Principal Problem:   Syncope Active Problems:   Seizure disorder   BPH (benign prostatic hyperplasia)   Status post patch closure of ASD   HTN (hypertension)   S/P ablation of atrial flutter   Gait disorder   Frequent falls  Discharge Condition: stable  Diet recommendation: heart healthy  Filed Weights   03/22/14 2043 03/23/14 0204 03/24/14 0508  Weight: 61.236 kg (135 lb) 60.147 kg (132 lb 9.6 oz) 60 kg (132 lb 4.4 oz)    History of present illness:  Jay Walters is a 80 y.o. male with Past medical history of hypertension, dyslipidemia, a flutter, chronic sinus bradycardia, history of seizure disorder, BPH.  The patient presented following an episode of unresponsiveness  Hospital Course:  1. Syncope -etiology unclear  -he was on 2 alpha blockers (FLomax and Cardura) for BPH and proscar. -Orthostatic vitals surprisingly normal  -No events on tele, 2D ECHo with EF of 55%, normal wall motion -evaluated by Cardiology, has a long-standing history of sinus bradycardia and first degree AV block -set up to have a 30day event monitor at discharge -Since alpha blocker can cause orthostatic hypotension, and he was on 2 of those, i stopped Flomax -continue cardura and proscar  -Ambulated, evaluated by Physical therapy too and SNF recommended  2. H/o pAfib and Aflutter/bradycardia  -continue coumadin, HR in 50s   3. BPH  -see above  -stopped flomax, continue cardura and proscar   4. H/o seizure disorder  -continue dilantin, level therapeutic  -history not suggestive of seizure at all   5. H/o CVA  -continue coumadin   6. Gait  disorder/frequent falls  -reportedly ongoing for over 1 year -SNF/Rehab recommended per PT -CSW following, DC to SNF when bed avilable  7. Cognitive Dysfunction -stable   Consultations:  Cardiology  Discharge Exam: Filed Vitals:   03/24/14 1223  BP: 134/63  Pulse: 46  Temp: 98.7 F (37.1 C)  Resp: 20    General: AAOx3 Cardiovascular: S1S2/RRR Respiratory: CTAB  Discharge Instructions You were cared for by a hospitalist during your hospital stay. If you have any questions about your discharge medications or the care you received while you were in the hospital after you are discharged, you can call the unit and asked to speak with the hospitalist on call if the hospitalist that took care of you is not available. Once you are discharged, your primary care physician will handle any further medical issues. Please note that NO REFILLS for any discharge medications will be authorized once you are discharged, as it is imperative that you return to your primary care physician (or establish a relationship with a primary care physician if you do not have one) for your aftercare needs so that they can reassess your need for medications and monitor your lab values.   Current Discharge Medication List    CONTINUE these medications which have NOT CHANGED   Details  amitriptyline (ELAVIL) 10 MG tablet Take 1 tablet (10 mg total) by mouth at bedtime. Qty: 30 tablet, Refills: 5    atorvastatin (LIPITOR) 20 MG tablet Take 1 tablet (20 mg total) by mouth daily. Qty: 90 tablet, Refills: 3    bimatoprost (LUMIGAN) 0.03 %  ophthalmic solution Place 1 drop into both eyes at bedtime. For 3 weeks, then return for a check up. Starting on 03/13/2014.    doxazosin (CARDURA) 4 MG tablet Take 4 mg by mouth at bedtime.    ferrous sulfate 325 (65 FE) MG tablet Take 325 mg by mouth 2 (two) times daily after a meal.     finasteride (PROSCAR) 5 MG tablet Take 1 tablet (5 mg total) by mouth daily. Qty: 30  tablet, Refills: 11    gabapentin (NEURONTIN) 300 MG capsule Take 300 mg by mouth 2 (two) times daily.    Multiple Vitamin (MULTIVITAMIN WITH MINERALS) TABS tablet Take 1 tablet by mouth daily.    naproxen sodium (ALEVE) 220 MG tablet Take 220 mg by mouth daily as needed (arthritis pain).    phenytoin (DILANTIN) 100 MG ER capsule Take 300 mg by mouth at bedtime.    Polyethyl Glycol-Propyl Glycol (SYSTANE ULTRA) 0.4-0.3 % SOLN Place 1 drop into both eyes daily.    warfarin (COUMADIN) 5 MG tablet Take 2.5-5 mg by mouth daily. Take 1/2 tablet (2.5 mg) on Monday and Friday, take 1 tablet (5 mg) on Sunday, Tuesday, Wednesday, Thursday and Saturday      STOP taking these medications     tamsulosin (FLOMAX) 0.4 MG CAPS capsule        No Known Allergies Follow-up Information   Follow up with Elby Showers, MD. Schedule an appointment as soon as possible for a visit in 1 week.   Specialty:  Internal Medicine   Contact information:   [address] [place] [id number] [phone]        The results of significant diagnostics from this hospitalization (including imaging, microbiology, ancillary and laboratory) are listed below for reference.    Significant Diagnostic Studies: Ct Head Wo Contrast  03/23/2014   CLINICAL DATA:  Syncope and Coumadin use.  Initial encounter  EXAM: CT HEAD WITHOUT CONTRAST  TECHNIQUE: Contiguous axial images were obtained from the base of the skull through the vertex without intravenous contrast.  COMPARISON:  07/28/2012  FINDINGS: Skull and Sinuses:Negative for fracture or destructive process. The mastoids, middle ears, and imaged paranasal sinuses are clear.  Orbits: No acute abnormality.  Brain: No evidence of acute abnormality, such as acute infarction, hemorrhage, acute hydrocephalus, or mass lesion/mass effect.  Stable ventriculomegaly which is prominent relative to sulcal enlargement at the vertex, but stable over multiple years. Confluence  bilateral cerebral white matter low density is stable compared to 2014 and consistent with moderate chronic small vessel disease.  IMPRESSION: 1. No acute intracranial findings. 2. Chronic changes are stable from 2014 and noted above.   Electronically Signed   By: Jorje Guild M.D.   On: 03/23/2014 01:22    Microbiology: No results found for this or any previous visit (from the past 240 hour(s)).   Labs: Basic Metabolic Panel:  Recent Labs Lab 03/22/14 2057 03/23/14 0715  NA 143 143  K 3.9 4.3  CL 104 108  CO2 27 25  GLUCOSE 175* 86  BUN 20 17  CREATININE 0.97 0.85  CALCIUM 9.1 8.7   Liver Function Tests:  Recent Labs Lab 03/23/14 0715  AST 27  ALT 30  ALKPHOS 59  BILITOT <0.2*  PROT 6.2  ALBUMIN 3.0*   No results found for this basename: LIPASE, AMYLASE,  in the last 168 hours No results found for this basename: AMMONIA,  in the last 168 hours CBC:  Recent Labs Lab 03/22/14 2057 03/23/14 0715  WBC 3.2* 4.1  NEUTROABS 1.7 2.2  HGB 12.5* 11.2*  HCT 38.0* 33.8*  MCV 90.5 89.7  PLT 147* 143*   Cardiac Enzymes:  Recent Labs Lab 03/22/14 2057 03/23/14 0715  TROPONINI <0.30 <0.30   BNP: BNP (last 3 results) No results found for this basename: PROBNP,  in the last 8760 hours CBG:  Recent Labs Lab 03/22/14 2042  GLUCAP 180*       Signed:  Cobi Aldape  [facility] 03/24/2014, 3:18 PM

## 2014-03-24 NOTE — Evaluation (Signed)
Physical Therapy Evaluation Patient Details Name: Jay Walters MRN: [id number] DOB: Jan 03, 1932 Today's Date: 03/24/2014   History of Present Illness  Patient is an 80 yo male admitted 03/22/14 following syncopal event, hitting posterior head with fall.  Patient with HR in 43-51 bpm range.  PMH:  HTN, A-flutter, Afib, bradycardia, seizures, CVA with Lt hemiparesis and aphasia, s/p Rt TKA.  Clinical Impression  Pt continues to show instability with transfers and gait using a RW as well as general weakness in core and extremities.  Expect pt was borderline safe prior to this admission, but now would be at significant risk to fall.     Follow Up Recommendations SNF;Supervision/Assistance - 24 hour    Equipment Recommendations  None recommended by PT    Recommendations for Other Services       Precautions / Restrictions Precautions Precautions: Fall Precaution Comments: Per patient and son, patient has had 4 falls in last few months.      Mobility  Bed Mobility               General bed mobility comments: already sitting in recliner  Transfers Overall transfer level: Needs assistance Equipment used: Rolling walker (2 wheeled) Transfers: Sit to/from Stand Sit to Stand: Min assist         General transfer comment: cues for hand placement.  Stability assist to help stay forward once up.  Ambulation/Gait Ambulation/Gait assistance: Min assist;Min guard Ambulation Distance (Feet): 75 Feet (x2) Assistive device: Rolling walker (2 wheeled) Gait Pattern/deviations: Step-through pattern;Decreased step length - right;Decreased step length - left;Decreased stride length Gait velocity: Decreased   General Gait Details: cues for safe use of RW.  Pt generally unsteady through out.  Initially displaying retropulsion and later with instability equally due to R knee or L leg giving way at times.  Occasional scissoring to try to regain balance.    Stairs Stairs: Yes Stairs  assistance: Mod assist Stair Management: One rail Right;Alternating pattern;Step to pattern;Forwards Number of Stairs: 10 General stair comments: Unsteady and weak-kneed thoughout up and down stairs.  More unsafe coming down due to weakness in knees.  Wheelchair Mobility    Modified Rankin (Stroke Patients Only)       Balance Overall balance assessment: Needs assistance   Sitting balance-Leahy Scale: Fair     Standing balance support: Bilateral upper extremity supported Standing balance-Leahy Scale: Poor                               Pertinent Vitals/Pain Pain Assessment: No/denies pain (unless he pushes on R knee)    Home Living                        Prior Function                 Hand Dominance        Extremity/Trunk Assessment                         Communication      Cognition Arousal/Alertness: Awake/alert Behavior During Therapy: WFL for tasks assessed/performed Overall Cognitive Status: Difficult to assess       Memory: Decreased short-term memory   Safety/Judgement: Decreased awareness of safety          General Comments General comments (skin integrity, edema, etc.): pt's instability is multifaceted, but is due in part to weaknesses in both  legs and trunk    Exercises        Assessment/Plan    PT Assessment    PT Diagnosis     PT Problem List    PT Treatment Interventions     PT Goals (Current goals can be found in the Care Plan section) Acute Rehab PT Goals Patient Stated Goal: To go home PT Goal Formulation: With patient/family Time For Goal Achievement: 03/30/14 Potential to Achieve Goals: Good    Frequency Min 3X/week   Barriers to discharge        Co-evaluation               End of Session   Activity Tolerance: Patient limited by fatigue;Patient tolerated treatment well Patient left: in chair;with call bell/phone within reach Nurse Communication: Mobility status          Time: 5747-3403 PT Time Calculation (min): 23 min   Charges:     PT Treatments $Gait Training: 8-22 mins $Therapeutic Activity: 8-22 mins   PT G Codes:          [name], Tessie Fass 03/24/2014, 4:01 PM  03/24/2014  Donnella Sham, PT [phone]  (pager)

## 2014-03-24 NOTE — Progress Notes (Signed)
Patient Name: Jay Walters Date of Encounter: 03/24/2014  Principal Problem:   Syncope Active Problems:   Seizure disorder   BPH (benign prostatic hyperplasia)   Status post patch closure of ASD   HTN (hypertension)   S/P ablation of atrial flutter    Patient Profile: 80 yo male w/ hx CVA 2013, aflutter, ASD repair, on Coumadin, HTN, HLD, BPH, Sz. Admitted 10/12 w/ syncope, HR 50s.  SUBJECTIVE: Does not get dizzy regularly, no chest pain or SOB. Looks good currently.   OBJECTIVE Filed Vitals:   03/23/14 1951 03/24/14 0127 03/24/14 0508 03/24/14 0740  BP: 115/55 105/55 123/58 128/62  Pulse: 54 44 45 50  Temp: 99.1 F (37.3 C) 98.5 F (36.9 C) 98.2 F (36.8 C) 98.4 F (36.9 C)  TempSrc: Oral Oral Oral Oral  Resp: 18 18 18 19   Height:      Weight:   132 lb 4.4 oz (60 kg)   SpO2: 95% 98% 96% 98%    Intake/Output Summary (Last 24 hours) at 03/24/14 0844 Last data filed at 03/23/14 1700  Gross per 24 hour  Intake    300 ml  Output    450 ml  Net   -150 ml   Filed Weights   03/22/14 2043 03/23/14 0204 03/24/14 0508  Weight: 135 lb (61.236 kg) 132 lb 9.6 oz (60.147 kg) 132 lb 4.4 oz (60 kg)    PHYSICAL EXAM General: Well developed, well nourished, male in no acute distress. Head: Normocephalic, atraumatic.  Neck: Supple without bruits, JVD slightly elevated. Lungs:  Resp regular and unlabored, CTA. Heart: RRR, S1, S2, no S3, S4, faint murmur; no rub. Abdomen: Soft, non-tender, non-distended, BS + x 4.  Extremities: No clubbing, cyanosis, no edema.  Neuro: Alert and oriented X 3. Moves all extremities spontaneously. Psych: Normal affect.  LABS: CBC:  Recent Labs  03/22/14 2057 03/23/14 0715  WBC 3.2* 4.1  NEUTROABS 1.7 2.2  HGB 12.5* 11.2*  HCT 38.0* 33.8*  MCV 90.5 89.7  PLT 147* 143*   INR:  Recent Labs  03/24/14 0347  INR 0.27*   Basic Metabolic Panel:  Recent Labs  03/22/14 2057 03/23/14 0715  NA 143 143  K 3.9 4.3  CL 104 108   CO2 27 25  GLUCOSE 175* 86  BUN 20 17  CREATININE 0.97 0.85  CALCIUM 9.1 8.7   Liver Function Tests:  Recent Labs  03/23/14 0715  AST 27  ALT 30  ALKPHOS 59  BILITOT <0.2*  PROT 6.2  ALBUMIN 3.0*   Cardiac Enzymes:  Recent Labs  03/22/14 2057 03/23/14 0715  TROPONINI <0.30 <0.30   TELE:  Mostly sinus brady, sustained in high 40s, nothing < 45 seen.     ECHO:03/23/2014  Study Conclusions - Left ventricle: The cavity size was normal. There was moderate concentric hypertrophy. Systolic function was normal. The estimated ejection fraction was in the range of 55% to 60%. Doppler parameters are consistent with abnormal left ventricular relaxation (grade 1 diastolic dysfunction). The E/e&' ratio is <8, suggesting normal LV filling pressure. - Aortic valve: Trileaflet. Sclerosis without stenosis. There was mild regurgitation. - Mitral valve: Mildly thickened leaflets . There was mild regurgitation. - Left atrium: The atrium was mildly dilated. - Right atrium: Severely dilated (29 cm2). - Atrial septum: Aneurysmal IAS - PFO cannot be excluded. - Tricuspid valve: There was trivial regurgitation. - Pulmonary arteries: PA peak pressure: 18 mm Hg (S). - Inferior vena cava: The vessel was normal  in size. The respirophasic diameter changes were in the normal range (>= 50%), consistent with normal central venous pressure. Impressions: - Compared to the prior echo in 2014, the RA is now severely dilated. There is an aneurysmal IAS with possible PFO.   Radiology/Studies: Ct Head Wo Contrast 03/23/2014   IMPRESSION: 1. No acute intracranial findings. 2. Chronic changes are stable from 2014 and noted above.   Electronically Signed   By: Jorje Guild M.D.   On: 03/23/2014 01:22    Current Medications:  . amitriptyline  10 mg Oral QHS  . atorvastatin  20 mg Oral Daily  . doxazosin  4 mg Oral Q2200  . ferrous sulfate  325 mg Oral BID PC  . finasteride  5 mg Oral Daily  .  gabapentin  300 mg Oral BID  . phenytoin  300 mg Oral QHS  . sodium chloride  3 mL Intravenous Q12H  . warfarin  2.5 mg Oral Once per day on Mon Fri  . warfarin  5 mg Oral Once per day on Sun Tue Wed Thu Sat  . Warfarin - Pharmacist Dosing Inpatient   Does not apply q1800      ASSESSMENT AND PLAN: Principal Problem:   Syncope - no critical arrhythmia on telemetry. Pt A&O with HR in the 40s. Will order event monitor and f/u with Dr. Debara Pickett.     Abnl echo: see above, RA severely dilated, aneurysmal IAS (?inter-atrial septum) with possible PFO. MD advise eval.  Otherwise, per IM Active Problems:   Seizure disorder   BPH (benign prostatic hyperplasia)   Status post patch closure of ASD   HTN (hypertension)   S/P ablation of atrial flutter   Signed, Rosaria Ferries , PA-C 8:44 AM 03/24/2014  Personally seen and examined. Agree with above. Changes made to note as needed.  He was admitted 03/22/14 with complaints from family of unresponsiveness. He was sitting on his inner chair yesterday and the family heard a thump and he leaned to the side against the wall while sitting. The patient's son helped him lower on the ground at which time he was not responsive for few seconds. The patient gradually turned around and was able to respond to questions appropriately. He was not confused, did not lose control of bowel or bladder, did not have any tonic-clonic body movement or any focal deficit.  There was no recurrence of this symptom and he did not have any similar events in the past. Pt does not remember passing out, only feeling tired.  EKG: S Brady rate of 51 PR 298, 1st degree AV block. OLD EKG 01/01/14 with HR 43 and PR 333 ms. Non specific t wave changes.  Troponin <0.30 X 2  INR 2.90 and today 3.07  CT of his head without acute changes.  Currently no complaints, no chest pain yesterday or today and no SOB. HR on tele as low as 49.   He has had closure in the past, patch closure of ASD  and this is likely the reason for his dilated right atrium. On echocardiogram does not appear to be a significant shunt detected.  On Coumadin for paroxysmal atrial fibrillation with prior stroke.  He has a long-standing history of sinus bradycardia and first degree AV block. He is not on any AV nodal blocking agents.  We will set up for an event monitor. Dr. Debara Pickett will follow.  Candee Furbish, MD

## 2014-03-24 NOTE — Progress Notes (Signed)
CSW (Clinical Education officer, museum) has spoken with pt multiple times today. He is unable to make decision on home vs SNF without speaking to his family. Pt has been provided with SNF bed offers. Pt expressed that he very much wants to go home today however he cannot do anything without speaking to his family. Pt wondering if he can decide on SNF from home. CSW explained that home health social worker could assist if needed, but process may not be as quick as hospital SNF process. Pt understanding of this. Pt nurse, RNCM, and MD all made aware of above discussion.  [place], [place]

## 2014-03-24 NOTE — Progress Notes (Signed)
Clinical Social Work Department BRIEF PSYCHOSOCIAL ASSESSMENT 03/24/2014  Patient:  Jay Walters, Jay Walters     Account Number:  [id number]     Admit date:  03/22/2014  Clinical Social Worker:  Adair Laundry  Date/Time:  03/24/2014 12:00 N  Referred by:  Physician  Date Referred:  03/24/2014 Referred for  SNF Placement   Other Referral:   Interview type:  Patient Other interview type:    PSYCHOSOCIAL DATA Living Status:  WIFE Admitted from facility:   Level of care:   Primary support name:  Cecelia Kneece Primary support relationship to patient:  SPOUSE Degree of support available:   Pt has good support    CURRENT CONCERNS Current Concerns  Post-Acute Placement   Other Concerns:    SOCIAL WORK ASSESSMENT / PLAN CSW aware of recommendation for SNF. CSW visited pt room and spoke with pt about recommendation. Pt informed CSW he agrees that he is not in the same shape he was in prior to admission. Howeve, pt unsure what he would want dc plan to be. CSW discussed [facility] and benefits of this dc plan. CSW also explained referral process. Pt is agreeable to CSW sending referrl to all [facility] however he would like to discuss with this family prior to making a decision. Pt declined for CSW to call his family.   Assessment/plan status:  Psychosocial Support/Ongoing Assessment of Needs Other assessment/ plan:   Information/referral to community resources:   SNF list to be provided with bed offers    PATIENT'S/FAMILY'S RESPONSE TO PLAN OF CARE: Pt is agreeable to SNF referrals being made but is hesistant to agree to SNF as dc plan.       [place], [place]

## 2014-03-25 ENCOUNTER — Telehealth: Payer: Self-pay

## 2014-03-25 DIAGNOSIS — R55 Syncope and collapse: Secondary | ICD-10-CM | POA: Diagnosis not present

## 2014-03-25 LAB — PROTIME-INR
INR: 1.81 — ABNORMAL HIGH (ref 0.00–1.49)
PROTHROMBIN TIME: 21 s — AB (ref 11.6–15.2)

## 2014-03-25 MED ORDER — WARFARIN SODIUM 5 MG PO TABS
5.0000 mg | ORAL_TABLET | ORAL | Status: AC
  Administered 2014-03-25: 5 mg via ORAL
  Filled 2014-03-25: qty 1

## 2014-03-25 NOTE — Progress Notes (Signed)
CSW Armed forces technical officer) spoke with pt wife over the phone and notified of current recommendation. Pt wife informed CSW she is understanding of recommendation but will not force pt to go to SNF again since his last experience was very negative. Pt wife open to home health services and she will arrange for family to pick up pt when he is ready for dc. She is requesting a call as soon as possible today so she can update the family member that was on standy-by to provide transportation today. CSW notified pt nurse and provided with best contact number for pt wife. CSW also updated MD and RNCM. At this time, pt has no further hospital social work needs. CSW signing off.  [place], [place]

## 2014-03-25 NOTE — Care Management Note (Signed)
    Page 1 of 1   03/25/2014     10:25:40 AM CARE MANAGEMENT NOTE 03/25/2014  Patient:  Jay Walters, Jay Walters   Account Number:  [id number]  Date Initiated:  03/25/2014  Documentation initiated by:  GRAVES-BIGELOW,Marycruz Boehner  Subjective/Objective Assessment:   Pt admitted for syncopal episode. Pt is from home with wife and plans to return home.     Action/Plan:   HH services set up with [facility]  and SOC to begin within 24-48 hrs post d/c. No further needs from CM at this time.   Anticipated DC Date:  03/25/2014   Anticipated DC Plan:  [place]  CM consult      [facility] Choice  HOME HEALTH   Choice offered to / List presented to:  C-3 Spouse        HH arranged  HH-1 RN  HH-10 DISEASE MANAGEMENT  HH-2 PT  [place].   Status of service:  Completed, signed off Medicare Important Message given?  NO (If response is "NO", the following Medicare IM given date fields will be blank) Date Medicare IM given:   Medicare IM given by:   Date Additional Medicare IM given:   Additional Medicare IM given by:    Discharge Disposition:  [place]  Per UR Regulation:  Reviewed for med. necessity/level of care/duration of stay  If discussed at [place] of Stay Meetings, dates discussed:    Comments:

## 2014-03-25 NOTE — Discharge Summary (Signed)
Physician Discharge Summary  [facility] WLS:[id number] DOB: [birth year] DOA: 03/22/2014  PCP: Elby Showers, MD  Admit date: 03/22/2014 Discharge date: 03/25/2014  Time spent:45 minutes  Recommendations for Outpatient Follow-up:  1. Event monitor Set up per Cardiology, office will call him 2. Dr.Baxley in 1 week 3. It was recommended initially that patient go to skilled nursing facility, however the patient's wife declined given that he had a bad experience previously. She will go home with him plus home health  Discharge Diagnoses:  Principal Problem:   Syncope Active Problems:   Seizure disorder   BPH (benign prostatic hyperplasia)   Status post patch closure of ASD   HTN (hypertension)   S/P ablation of atrial flutter   Gait disorder   Frequent falls  Discharge Condition: stable  Diet recommendation: heart healthy  Filed Weights   03/23/14 0204 03/24/14 0508 03/25/14 0400  Weight: 60.147 kg (132 lb 9.6 oz) 60 kg (132 lb 4.4 oz) 59.8 kg (131 lb 13.4 oz)    History of present illness:  Jay Walters is a 80 y.o. male with Past medical history of hypertension, dyslipidemia, a flutter, chronic sinus bradycardia, history of seizure disorder, BPH.  The patient presented following an episode of unresponsiveness  Hospital Course:  1. Syncope -etiology unclear  -he was on 2 alpha blockers (FLomax and Cardura) for BPH and proscar. -Orthostatic vitals surprisingly normal  -No events on tele, 2D ECHo with EF of 55%, normal wall motion -evaluated by Cardiology, has a long-standing history of sinus bradycardia and first degree AV block -set up to have a 30day event monitor at discharge -Since alpha blocker can cause orthostatic hypotension, and he was on 2 of those, i stopped Flomax -continue cardura and proscar  -Ambulated, evaluated by Physical therapy too and SNF recommended  2. H/o pAfib and Aflutter/bradycardia  -continue coumadin, HR in 50s   3. BPH  -see above   -stopped flomax, continue cardura and proscar   4. H/o seizure disorder  -continue dilantin, level therapeutic  -history not suggestive of seizure at all   5. H/o CVA  -continue coumadin   6. Gait disorder/frequent falls  -reportedly ongoing for over 1 year -SNF/Rehab recommended per PT -CSW following, DC to SNF when bed avilable  7. Cognitive Dysfunction -stable   Consultations:  Cardiology  Discharge Exam: Filed Vitals:   03/25/14 1133  BP: 128/59  Pulse: 48  Temp: 97.7 F (36.5 C)  Resp: 16    General: Alert and oriented x3 Cardiovascular: Regular rate and rhythm, S1-S2 Respiratory: Clear to auscultation bilaterally  Discharge Instructions You were cared for by a hospitalist during your hospital stay. If you have any questions about your discharge medications or the care you received while you were in the hospital after you are discharged, you can call the unit and asked to speak with the hospitalist on call if the hospitalist that took care of you is not available. Once you are discharged, your primary care physician will handle any further medical issues. Please note that NO REFILLS for any discharge medications will be authorized once you are discharged, as it is imperative that you return to your primary care physician (or establish a relationship with a primary care physician if you do not have one) for your aftercare needs so that they can reassess your need for medications and monitor your lab values.  Discharge Instructions   Diet - low sodium heart healthy    Complete by:  As directed  Current Discharge Medication List    CONTINUE these medications which have NOT CHANGED   Details  amitriptyline (ELAVIL) 10 MG tablet Take 1 tablet (10 mg total) by mouth at bedtime. Qty: 30 tablet, Refills: 5    atorvastatin (LIPITOR) 20 MG tablet Take 1 tablet (20 mg total) by mouth daily. Qty: 90 tablet, Refills: 3    bimatoprost (LUMIGAN) 0.03 %  ophthalmic solution Place 1 drop into both eyes at bedtime. For 3 weeks, then return for a check up. Starting on 03/13/2014.    doxazosin (CARDURA) 4 MG tablet Take 4 mg by mouth at bedtime.    ferrous sulfate 325 (65 FE) MG tablet Take 325 mg by mouth 2 (two) times daily after a meal.     finasteride (PROSCAR) 5 MG tablet Take 1 tablet (5 mg total) by mouth daily. Qty: 30 tablet, Refills: 11    gabapentin (NEURONTIN) 300 MG capsule Take 300 mg by mouth 2 (two) times daily.    Multiple Vitamin (MULTIVITAMIN WITH MINERALS) TABS tablet Take 1 tablet by mouth daily.    naproxen sodium (ALEVE) 220 MG tablet Take 220 mg by mouth daily as needed (arthritis pain).    phenytoin (DILANTIN) 100 MG ER capsule Take 300 mg by mouth at bedtime.    Polyethyl Glycol-Propyl Glycol (SYSTANE ULTRA) 0.4-0.3 % SOLN Place 1 drop into both eyes daily.    warfarin (COUMADIN) 5 MG tablet Take 2.5-5 mg by mouth daily. Take 1/2 tablet (2.5 mg) on Monday and Friday, take 1 tablet (5 mg) on Sunday, Tuesday, Wednesday, Thursday and Saturday      STOP taking these medications     tamsulosin (FLOMAX) 0.4 MG CAPS capsule        No Known Allergies Follow-up Information   Follow up with Elby Showers, MD On 03/31/2014. (at 2:00pm)    Specialty:  Internal Medicine   Contact information:   [address] [address] [phone]       Follow up with [place]. (Equities trader for lab draws, Physical Therapy and Education officer, museum.  )    Sport and exercise psychologist information:   [address] [address] [phone]       Follow up with [facility] On 03/27/2014. (@ 10:30am Please pick up heart monitor )    Specialty:  Cardiology   Contact information:   [address] [place] [address] [phone]      Follow up with [facility] On 03/27/2014. (@ 10:10 am for coumadin clinic appt. )    Specialty:  Cardiology   Contact  information:   [address] [place] [address] [phone]      Follow up with Pixie Casino, MD. (The office will call you to make an appoinment., If you do not hear from them, please contact them. You should be seen in about 1 month)    Specialty:  Cardiology   Contact information:   [place] [address] [phone]        The results of significant diagnostics from this hospitalization (including imaging, microbiology, ancillary and laboratory) are listed below for reference.    Significant Diagnostic Studies: Ct Head Wo Contrast  03/23/2014   CLINICAL DATA:  Syncope and Coumadin use.  Initial encounter  EXAM: CT HEAD WITHOUT CONTRAST  TECHNIQUE: Contiguous axial images were obtained from the base of the skull through the vertex without intravenous contrast.  COMPARISON:  07/28/2012  FINDINGS:  Skull and Sinuses:Negative for fracture or destructive process. The mastoids, middle ears, and imaged paranasal sinuses are clear.  Orbits: No acute abnormality.  Brain: No evidence of acute abnormality, such as acute infarction, hemorrhage, acute hydrocephalus, or mass lesion/mass effect.  Stable ventriculomegaly which is prominent relative to sulcal enlargement at the vertex, but stable over multiple years. Confluence bilateral cerebral white matter low density is stable compared to 2014 and consistent with moderate chronic small vessel disease.  IMPRESSION: 1. No acute intracranial findings. 2. Chronic changes are stable from 2014 and noted above.   Electronically Signed   By: Jorje Guild M.D.   On: 03/23/2014 01:22    Microbiology: No results found for this or any previous visit (from the past 240 hour(s)).   Labs: Basic Metabolic Panel:  Recent Labs Lab 03/22/14 2057 03/23/14 0715  NA 143 143  K 3.9 4.3  CL 104 108  CO2 27 25  GLUCOSE 175* 86  BUN 20 17  CREATININE 0.97 0.85  CALCIUM 9.1 8.7   Liver Function  Tests:  Recent Labs Lab 03/23/14 0715  AST 27  ALT 30  ALKPHOS 59  BILITOT <0.2*  PROT 6.2  ALBUMIN 3.0*   No results found for this basename: LIPASE, AMYLASE,  in the last 168 hours No results found for this basename: AMMONIA,  in the last 168 hours CBC:  Recent Labs Lab 03/22/14 2057 03/23/14 0715  WBC 3.2* 4.1  NEUTROABS 1.7 2.2  HGB 12.5* 11.2*  HCT 38.0* 33.8*  MCV 90.5 89.7  PLT 147* 143*   Cardiac Enzymes:  Recent Labs Lab 03/22/14 2057 03/23/14 0715  TROPONINI <0.30 <0.30   BNP: BNP (last 3 results) No results found for this basename: PROBNP,  in the last 8760 hours CBG:  Recent Labs Lab 03/22/14 2042  GLUCAP 180*       Signed:  Blannie Shedlock K  [facility] 03/25/2014, 1:18 PM

## 2014-03-25 NOTE — Discharge Instructions (Signed)
Syncope °Syncope is a medical term for fainting or passing out. This means you lose consciousness and drop to the ground. People are generally unconscious for less than 5 minutes. You may have some muscle twitches for up to 15 seconds before waking up and returning to normal. Syncope occurs more often in older adults, but it can happen to anyone. While most causes of syncope are not dangerous, syncope can be a sign of a serious medical problem. It is important to seek medical care.  °CAUSES  °Syncope is caused by a sudden drop in blood flow to the brain. The specific cause is often not determined. Factors that can bring on syncope include: °· Taking medicines that lower blood pressure. °· Sudden changes in posture, such as standing up quickly. °· Taking more medicine than prescribed. °· Standing in one place for too long. °· Seizure disorders. °· Dehydration and excessive exposure to heat. °· Low blood sugar (hypoglycemia). °· Straining to have a bowel movement. °· Heart disease, irregular heartbeat, or other circulatory problems. °· Fear, emotional distress, seeing blood, or severe pain. °SYMPTOMS  °Right before fainting, you may: °· Feel dizzy or light-headed. °· Feel nauseous. °· See all white or all black in your field of vision. °· Have cold, clammy skin. °DIAGNOSIS  °Your health care provider will ask about your symptoms, perform a physical exam, and perform an electrocardiogram (ECG) to record the electrical activity of your heart. Your health care provider may also perform other heart or blood tests to determine the cause of your syncope which may include: °· Transthoracic echocardiogram (TTE). During echocardiography, sound waves are used to evaluate how blood flows through your heart. °· Transesophageal echocardiogram (TEE). °· Cardiac monitoring. This allows your health care provider to monitor your heart rate and rhythm in real time. °· Holter monitor. This is a portable device that records your  heartbeat and can help diagnose heart arrhythmias. It allows your health care provider to track your heart activity for several days, if needed. °· Stress tests by exercise or by giving medicine that makes the heart beat faster. °TREATMENT  °In most cases, no treatment is needed. Depending on the cause of your syncope, your health care provider may recommend changing or stopping some of your medicines. °HOME CARE INSTRUCTIONS °· Have someone stay with you until you feel stable. °· Do not drive, use machinery, or play sports until your health care provider says it is okay. °· Keep all follow-up appointments as directed by your health care provider. °· Lie down right away if you start feeling like you might faint. Breathe deeply and steadily. Wait until all the symptoms have passed. °· Drink enough fluids to keep your urine clear or pale yellow. °· If you are taking blood pressure or heart medicine, get up slowly and take several minutes to sit and then stand. This can reduce dizziness. °SEEK IMMEDIATE MEDICAL CARE IF:  °· You have a severe headache. °· You have unusual pain in the chest, abdomen, or back. °· You are bleeding from your mouth or rectum, or you have black or tarry stool. °· You have an irregular or very fast heartbeat. °· You have pain with breathing. °· You have repeated fainting or seizure-like jerking during an episode. °· You faint when sitting or lying down. °· You have confusion. °· You have trouble walking. °· You have severe weakness. °· You have vision problems. °If you fainted, call your local emergency services (911 in [place] Do not drive   yourself to the hospital.  °MAKE SURE YOU: °· Understand these instructions. °· Will watch your condition. °· Will get help right away if you are not doing well or get worse. °Document Released: 05/29/2005 Document Revised: 06/03/2013 Document Reviewed: 07/28/2011 °ExitCare® Patient Information ©2015 [employer]. This information is not intended to replace  advice given to you by your health care provider. Make sure you discuss any questions you have with your health care provider. ° °

## 2014-03-25 NOTE — Progress Notes (Signed)
Patient discharged to home, verbalized understanding of discharge instructions. VSS, all belongings returned.

## 2014-03-25 NOTE — Telephone Encounter (Signed)
Left message for patient to call office to follow up from being in the hospital and to remind him of his appointment 03/31/2014 at 12.

## 2014-03-26 ENCOUNTER — Ambulatory Visit (INDEPENDENT_AMBULATORY_CARE_PROVIDER_SITE_OTHER): Payer: 59 | Admitting: Pharmacist Clinician (PhC)/ Clinical Pharmacy Specialist

## 2014-03-26 ENCOUNTER — Telehealth: Payer: Self-pay | Admitting: Internal Medicine

## 2014-03-26 DIAGNOSIS — I483 Typical atrial flutter: Secondary | ICD-10-CM

## 2014-03-26 LAB — POCT INR: INR: 2.2

## 2014-03-26 NOTE — Telephone Encounter (Signed)
Closed encounter °

## 2014-03-26 NOTE — Telephone Encounter (Signed)
Received a call from [place] with [facility] reporting patient's INR today 2.2.She stated patient is taking Warfarin 5 mg every day except 2.5 mg on Mon and Fri.Message sent to our pharmacist Tommy Medal.

## 2014-03-27 ENCOUNTER — Ambulatory Visit: Payer: 59 | Admitting: Pharmacist Clinician (PhC)/ Clinical Pharmacy Specialist

## 2014-03-30 ENCOUNTER — Ambulatory Visit (INDEPENDENT_AMBULATORY_CARE_PROVIDER_SITE_OTHER): Payer: 59 | Admitting: Pharmacist Clinician (PhC)/ Clinical Pharmacy Specialist

## 2014-03-30 DIAGNOSIS — I483 Typical atrial flutter: Secondary | ICD-10-CM

## 2014-03-31 ENCOUNTER — Telehealth: Payer: Self-pay | Admitting: Internal Medicine

## 2014-03-31 ENCOUNTER — Encounter: Payer: Self-pay | Admitting: Internal Medicine

## 2014-03-31 ENCOUNTER — Encounter: Payer: 59 | Admitting: Internal Medicine

## 2014-03-31 NOTE — Progress Notes (Signed)
   Subjective:    Patient ID: Jay Walters, male    DOB: 15-Jul-1931, 80 y.o.   MRN: [id number]  HPI  Did not keep appt for hospital follow up. Pt. contacted and denied knowing about appt.   Appt rescheduled.     Review of Systems     Objective:   Physical Exam        Assessment & Plan:

## 2014-03-31 NOTE — Telephone Encounter (Signed)
Missed primary care followup appointment for recent hospitalization. Patient contacted. Denied knowing about appointment. Says he wants to reschedule on a Friday. Apparently son is off on Fridays and  can bring him then.

## 2014-04-02 ENCOUNTER — Telehealth: Payer: Self-pay | Admitting: Internal Medicine

## 2014-04-02 ENCOUNTER — Ambulatory Visit (INDEPENDENT_AMBULATORY_CARE_PROVIDER_SITE_OTHER): Payer: 59 | Admitting: Pharmacist Clinician (PhC)/ Clinical Pharmacy Specialist

## 2014-04-02 DIAGNOSIS — I483 Typical atrial flutter: Secondary | ICD-10-CM

## 2014-04-02 LAB — POCT INR: INR: 2.2

## 2014-04-02 NOTE — Telephone Encounter (Signed)
PT 26.3 INR 2.2  Will routed to [place], PharmD Call instructions to [facility])

## 2014-04-02 NOTE — Telephone Encounter (Signed)
See anticoag note

## 2014-04-09 ENCOUNTER — Ambulatory Visit (INDEPENDENT_AMBULATORY_CARE_PROVIDER_SITE_OTHER): Payer: 59 | Admitting: Pharmacist Clinician (PhC)/ Clinical Pharmacy Specialist

## 2014-04-09 ENCOUNTER — Telehealth: Payer: Self-pay | Admitting: Pharmacist Clinician (PhC)/ Clinical Pharmacy Specialist

## 2014-04-09 DIAGNOSIS — I483 Typical atrial flutter: Secondary | ICD-10-CM | POA: Diagnosis not present

## 2014-04-09 LAB — POCT INR: INR: 1.5

## 2014-04-09 NOTE — Telephone Encounter (Signed)
Donita -nurse with [facility] called to report patient's INR today as 1.5  PT 17.6   Patient has eaten greens twice since recent blood draw last week. Message will be routed to [facility] to call Donita back with orders @ [phone].

## 2014-04-10 ENCOUNTER — Encounter: Payer: Self-pay | Admitting: Internal Medicine

## 2014-04-10 ENCOUNTER — Telehealth: Payer: Self-pay | Admitting: *Deleted

## 2014-04-10 ENCOUNTER — Ambulatory Visit (INDEPENDENT_AMBULATORY_CARE_PROVIDER_SITE_OTHER): Payer: 59 | Admitting: Internal Medicine

## 2014-04-10 ENCOUNTER — Ambulatory Visit: Payer: 59 | Admitting: Internal Medicine

## 2014-04-10 VITALS — BP 100/58 | HR 54 | Temp 97.8°F | Wt 137.0 lb

## 2014-04-10 DIAGNOSIS — I10 Essential (primary) hypertension: Secondary | ICD-10-CM | POA: Diagnosis not present

## 2014-04-10 DIAGNOSIS — G40909 Epilepsy, unspecified, not intractable, without status epilepticus: Secondary | ICD-10-CM | POA: Diagnosis not present

## 2014-04-10 DIAGNOSIS — Z87440 Personal history of urinary (tract) infections: Secondary | ICD-10-CM

## 2014-04-10 DIAGNOSIS — Z23 Encounter for immunization: Secondary | ICD-10-CM

## 2014-04-10 DIAGNOSIS — D649 Anemia, unspecified: Secondary | ICD-10-CM

## 2014-04-10 DIAGNOSIS — R531 Weakness: Secondary | ICD-10-CM

## 2014-04-10 DIAGNOSIS — R55 Syncope and collapse: Secondary | ICD-10-CM | POA: Diagnosis not present

## 2014-04-10 DIAGNOSIS — N400 Enlarged prostate without lower urinary tract symptoms: Secondary | ICD-10-CM

## 2014-04-10 LAB — IRON AND TIBC
%SAT: 20 % (ref 20–55)
Iron: 45 ug/dL (ref 42–165)
TIBC: 221 ug/dL (ref 215–435)
UIBC: 176 ug/dL (ref 125–400)

## 2014-04-10 LAB — CBC WITH DIFFERENTIAL/PLATELET
Basophils Absolute: 0 10*3/uL (ref 0.0–0.1)
Basophils Relative: 1 % (ref 0–1)
EOS ABS: 0 10*3/uL (ref 0.0–0.7)
EOS PCT: 1 % (ref 0–5)
HCT: 36.9 % — ABNORMAL LOW (ref 39.0–52.0)
Hemoglobin: 12.5 g/dL — ABNORMAL LOW (ref 13.0–17.0)
Lymphocytes Relative: 35 % (ref 12–46)
Lymphs Abs: 1.5 10*3/uL (ref 0.7–4.0)
MCH: 30.6 pg (ref 26.0–34.0)
MCHC: 33.9 g/dL (ref 30.0–36.0)
MCV: 90.4 fL (ref 78.0–100.0)
Monocytes Absolute: 0.4 10*3/uL (ref 0.1–1.0)
Monocytes Relative: 10 % (ref 3–12)
NEUTROS ABS: 2.3 10*3/uL (ref 1.7–7.7)
NEUTROS PCT: 53 % (ref 43–77)
Platelets: 186 10*3/uL (ref 150–400)
RBC: 4.08 MIL/uL — ABNORMAL LOW (ref 4.22–5.81)
RDW: 15.2 % (ref 11.5–15.5)
WBC: 4.4 10*3/uL (ref 4.0–10.5)

## 2014-04-10 LAB — COMPREHENSIVE METABOLIC PANEL
ALK PHOS: 76 U/L (ref 39–117)
ALT: 16 U/L (ref 0–53)
AST: 15 U/L (ref 0–37)
Albumin: 3.5 g/dL (ref 3.5–5.2)
BILIRUBIN TOTAL: 0.2 mg/dL (ref 0.2–1.2)
BUN: 14 mg/dL (ref 6–23)
CALCIUM: 8.7 mg/dL (ref 8.4–10.5)
CO2: 25 meq/L (ref 19–32)
Chloride: 106 mEq/L (ref 96–112)
Creat: 0.88 mg/dL (ref 0.50–1.35)
Glucose, Bld: 93 mg/dL (ref 70–99)
Potassium: 4.1 mEq/L (ref 3.5–5.3)
Sodium: 143 mEq/L (ref 135–145)
Total Protein: 6.5 g/dL (ref 6.0–8.3)

## 2014-04-10 LAB — POCT URINALYSIS DIPSTICK
Bilirubin, UA: NEGATIVE
Glucose, UA: NEGATIVE
Ketones, UA: NEGATIVE
LEUKOCYTES UA: NEGATIVE
Nitrite, UA: NEGATIVE
Protein, UA: NEGATIVE
RBC UA: NEGATIVE
Urobilinogen, UA: NEGATIVE
pH, UA: 6

## 2014-04-10 LAB — VITAMIN B12: Vitamin B-12: 469 pg/mL (ref 211–911)

## 2014-04-10 LAB — FOLATE: Folate: 12.8 ng/mL

## 2014-04-10 NOTE — Progress Notes (Signed)
   Subjective:    Patient ID: Jay Walters, male    DOB: 11-26-1931, 80 y.o.   MRN: [id number]  HPI  81 year Male had syncopal episode at home sitting on a stool after putting dishes up in a cabinet. Hx of chronic anticoagulation on Coumadin. INR was slightly over 3 in ED. He was admitted for observation. Cardura was continued. Flomax was discontinued. He was on Cardura per Cardiology and Flomax per urology. It was felt that alpha blockers could cause hypotension and therefore one was discontinued which is reasonable. He has home health services. Gets along fairly well with a walker. He had a CT of his head when he was hospitalized showing stable chronic changes and nothing new in terms of intracranial pathology. He has remote history of seizure disorder and is on Dilantin.    Review of Systems     Objective:   Physical Exam  He is alert and oriented 3. Chest clear. Cardiac exam regular rate and rhythm. Extremities without edema.      Assessment & Plan:  Status post syncopal episode at home-etiology unclear  BPH  Nocturia  History of seizure disorder  Chronic anticoagulation due to atrial flutter history  Plan: His urinalysis is normal. No evidence of UTI. He is mildly anemic. Anemia studies will be performed. Return next week for Prevnar 13 vaccine.

## 2014-04-10 NOTE — Telephone Encounter (Signed)
Faxed orders r/t PT/INR

## 2014-04-13 ENCOUNTER — Telehealth: Payer: Self-pay

## 2014-04-13 NOTE — Telephone Encounter (Signed)
-----   Message from Elby Showers, MD sent at 04/11/2014 12:34 PM EDT ----- Needs to take MVI with iron daily. Please call wife, [name] and tell her.  Iron level is low normal.

## 2014-04-13 NOTE — Telephone Encounter (Signed)
Patient informed he needs to start a multivitamin with iron.

## 2014-04-14 ENCOUNTER — Telehealth: Payer: Self-pay | Admitting: *Deleted

## 2014-04-14 NOTE — Telephone Encounter (Signed)
Faxed signed orders r/t PT/INR

## 2014-04-17 ENCOUNTER — Ambulatory Visit: Payer: 59 | Admitting: Internal Medicine

## 2014-04-17 ENCOUNTER — Ambulatory Visit (INDEPENDENT_AMBULATORY_CARE_PROVIDER_SITE_OTHER): Payer: 59 | Admitting: Pharmacist Clinician (PhC)/ Clinical Pharmacy Specialist

## 2014-04-17 DIAGNOSIS — Z23 Encounter for immunization: Secondary | ICD-10-CM

## 2014-04-17 DIAGNOSIS — I483 Typical atrial flutter: Secondary | ICD-10-CM

## 2014-04-17 LAB — POCT INR: INR: 2.9

## 2014-04-17 MED ORDER — PNEUMOCOCCAL 13-VAL CONJ VACC IM SUSP
0.5000 mL | Freq: Once | INTRAMUSCULAR | Status: AC
  Administered 2014-04-17: 0.5 mL via INTRAMUSCULAR

## 2014-04-23 ENCOUNTER — Telehealth: Payer: Self-pay | Admitting: *Deleted

## 2014-04-23 NOTE — Telephone Encounter (Signed)
Signed order r/t PT/INR faxed

## 2014-04-24 ENCOUNTER — Ambulatory Visit (INDEPENDENT_AMBULATORY_CARE_PROVIDER_SITE_OTHER): Payer: 59 | Admitting: Pharmacist Clinician (PhC)/ Clinical Pharmacy Specialist

## 2014-04-24 ENCOUNTER — Other Ambulatory Visit: Payer: 59 | Admitting: Internal Medicine

## 2014-04-24 DIAGNOSIS — Z131 Encounter for screening for diabetes mellitus: Secondary | ICD-10-CM

## 2014-04-24 DIAGNOSIS — Z1329 Encounter for screening for other suspected endocrine disorder: Secondary | ICD-10-CM

## 2014-04-24 DIAGNOSIS — I10 Essential (primary) hypertension: Secondary | ICD-10-CM

## 2014-04-24 DIAGNOSIS — Z130 Encounter for screening for diseases of the blood and blood-forming organs and certain disorders involving the immune mechanism: Secondary | ICD-10-CM

## 2014-04-24 DIAGNOSIS — I483 Typical atrial flutter: Secondary | ICD-10-CM

## 2014-04-24 DIAGNOSIS — I4892 Unspecified atrial flutter: Secondary | ICD-10-CM

## 2014-04-24 LAB — POCT INR: INR: 1.8

## 2014-04-24 LAB — CBC WITH DIFFERENTIAL/PLATELET
BASOS ABS: 0 10*3/uL (ref 0.0–0.1)
Basophils Relative: 0 % (ref 0–1)
EOS ABS: 0.1 10*3/uL (ref 0.0–0.7)
EOS PCT: 2 % (ref 0–5)
HCT: 39.6 % (ref 39.0–52.0)
Hemoglobin: 13 g/dL (ref 13.0–17.0)
LYMPHS ABS: 1.8 10*3/uL (ref 0.7–4.0)
Lymphocytes Relative: 44 % (ref 12–46)
MCH: 30 pg (ref 26.0–34.0)
MCHC: 32.8 g/dL (ref 30.0–36.0)
MCV: 91.5 fL (ref 78.0–100.0)
Monocytes Absolute: 0.3 10*3/uL (ref 0.1–1.0)
Monocytes Relative: 7 % (ref 3–12)
Neutro Abs: 1.9 10*3/uL (ref 1.7–7.7)
Neutrophils Relative %: 47 % (ref 43–77)
Platelets: 186 10*3/uL (ref 150–400)
RBC: 4.33 MIL/uL (ref 4.22–5.81)
RDW: 14.8 % (ref 11.5–15.5)
WBC: 4 10*3/uL (ref 4.0–10.5)

## 2014-04-24 LAB — LIPID PANEL
CHOLESTEROL: 159 mg/dL (ref 0–200)
HDL: 56 mg/dL (ref >39)
LDL Cholesterol: 87 mg/dL (ref 0–99)
TRIGLYCERIDES: 78 mg/dL (ref <150)
Total CHOL/HDL Ratio: 2.8 Ratio
VLDL: 16 mg/dL (ref 0–40)

## 2014-04-24 LAB — COMPREHENSIVE METABOLIC PANEL
ALT: 18 U/L (ref 0–53)
AST: 17 U/L (ref 0–37)
Albumin: 3.7 g/dL (ref 3.5–5.2)
Alkaline Phosphatase: 73 U/L (ref 39–117)
BILIRUBIN TOTAL: 0.3 mg/dL (ref 0.2–1.2)
BUN: 18 mg/dL (ref 6–23)
CO2: 25 meq/L (ref 19–32)
CREATININE: 0.93 mg/dL (ref 0.50–1.35)
Calcium: 9.2 mg/dL (ref 8.4–10.5)
Chloride: 104 mEq/L (ref 96–112)
Glucose, Bld: 87 mg/dL (ref 70–99)
Potassium: 4.3 mEq/L (ref 3.5–5.3)
Sodium: 142 mEq/L (ref 135–145)
Total Protein: 6.9 g/dL (ref 6.0–8.3)

## 2014-04-24 LAB — HEMOGLOBIN A1C
Hgb A1c MFr Bld: 5.9 % — ABNORMAL HIGH (ref <5.7)
MEAN PLASMA GLUCOSE: 123 mg/dL — AB (ref <117)

## 2014-04-24 LAB — TSH: TSH: 5.611 u[IU]/mL — AB (ref 0.350–4.500)

## 2014-04-25 ENCOUNTER — Encounter: Payer: Self-pay | Admitting: Internal Medicine

## 2014-04-25 ENCOUNTER — Telehealth: Payer: Self-pay | Admitting: Internal Medicine

## 2014-04-25 MED ORDER — LEVOTHYROXINE SODIUM 50 MCG PO TABS: 50.0000 ug | ORAL_TABLET | Freq: Every day | ORAL | Status: DC

## 2014-04-25 NOTE — Telephone Encounter (Signed)
Lab work shows hypothyroidism. Has elevated TSH. Start levothyroxine 0.05 mg daily on empty stomach. Left wife specific instructions on voice mail at work. He has follow-up appointment here November 20. He will need follow-up TSH around January 20.

## 2014-04-28 ENCOUNTER — Emergency Department (HOSPITAL_COMMUNITY): Payer: 59

## 2014-04-28 ENCOUNTER — Emergency Department (HOSPITAL_COMMUNITY)
Admission: EM | Admit: 2014-04-28 | Discharge: 2014-04-29 | Disposition: A | Discharge status: Home / Self Care | Payer: 59 | Attending: Emergency Medicine | Admitting: Emergency Medicine

## 2014-04-28 ENCOUNTER — Encounter (HOSPITAL_COMMUNITY): Payer: Self-pay | Admitting: Emergency Medicine

## 2014-04-28 DIAGNOSIS — Z8673 Personal history of transient ischemic attack (TIA), and cerebral infarction without residual deficits: Secondary | ICD-10-CM | POA: Insufficient documentation

## 2014-04-28 DIAGNOSIS — M1990 Unspecified osteoarthritis, unspecified site: Secondary | ICD-10-CM | POA: Insufficient documentation

## 2014-04-28 DIAGNOSIS — S0990XA Unspecified injury of head, initial encounter: Secondary | ICD-10-CM | POA: Diagnosis present

## 2014-04-28 DIAGNOSIS — I10 Essential (primary) hypertension: Secondary | ICD-10-CM | POA: Diagnosis not present

## 2014-04-28 DIAGNOSIS — R011 Cardiac murmur, unspecified: Secondary | ICD-10-CM | POA: Diagnosis not present

## 2014-04-28 DIAGNOSIS — Z8719 Personal history of other diseases of the digestive system: Secondary | ICD-10-CM | POA: Insufficient documentation

## 2014-04-28 DIAGNOSIS — W19XXXA Unspecified fall, initial encounter: Secondary | ICD-10-CM

## 2014-04-28 DIAGNOSIS — Z7901 Long term (current) use of anticoagulants: Secondary | ICD-10-CM | POA: Diagnosis not present

## 2014-04-28 DIAGNOSIS — Y9289 Other specified places as the place of occurrence of the external cause: Secondary | ICD-10-CM | POA: Diagnosis not present

## 2014-04-28 DIAGNOSIS — D649 Anemia, unspecified: Secondary | ICD-10-CM | POA: Diagnosis not present

## 2014-04-28 DIAGNOSIS — Z79899 Other long term (current) drug therapy: Secondary | ICD-10-CM | POA: Diagnosis not present

## 2014-04-28 DIAGNOSIS — Y9389 Activity, other specified: Secondary | ICD-10-CM | POA: Insufficient documentation

## 2014-04-28 DIAGNOSIS — Y92009 Unspecified place in unspecified non-institutional (private) residence as the place of occurrence of the external cause: Secondary | ICD-10-CM

## 2014-04-28 DIAGNOSIS — W11XXXA Fall on and from ladder, initial encounter: Secondary | ICD-10-CM | POA: Insufficient documentation

## 2014-04-28 DIAGNOSIS — S0181XA Laceration without foreign body of other part of head, initial encounter: Secondary | ICD-10-CM | POA: Diagnosis not present

## 2014-04-28 DIAGNOSIS — Y998 Other external cause status: Secondary | ICD-10-CM | POA: Insufficient documentation

## 2014-04-28 DIAGNOSIS — Z8709 Personal history of other diseases of the respiratory system: Secondary | ICD-10-CM | POA: Insufficient documentation

## 2014-04-28 LAB — PROTIME-INR
INR: 2.85 — AB (ref 0.00–1.49)
PROTHROMBIN TIME: 30.2 s — AB (ref 11.6–15.2)

## 2014-04-28 MED ORDER — LIDOCAINE-EPINEPHRINE (PF) 2 %-1:200000 IJ SOLN
10.0000 mL | Freq: Once | INTRAMUSCULAR | Status: AC
  Administered 2014-04-29: 10 mL
  Filled 2014-04-28: qty 20

## 2014-04-28 NOTE — ED Provider Notes (Signed)
LACERATION REPAIR Performed by: Antonietta Breach Authorized by: Antonietta Breach Consent: Verbal consent obtained. Risks and benefits: risks, benefits and alternatives were discussed Consent given by: patient Patient identity confirmed: provided demographic data Prepped and Draped in normal sterile fashion Wound explored  Laceration Location: L forehead, above eyebrow  Laceration Length: 5cm  No Foreign Bodies seen or palpated  Anesthesia: local infiltration  Local anesthetic: lidocaine 2% with epinephrine  Anesthetic total: 4 ml  Irrigation method: syringe Amount of cleaning: standard  Skin closure: 5-0 prolene  Number of sutures: 6  Technique: simple interrupted  Patient tolerance: Patient tolerated the procedure well with no immediate complications.   Antonietta Breach, PA-C 04/29/14 [place], MD 04/29/14 [phone]

## 2014-04-28 NOTE — ED Notes (Signed)
Pt refuses pain meds at this time, states he is comfortable

## 2014-04-28 NOTE — ED Notes (Signed)
Patient transported to CT 

## 2014-04-28 NOTE — ED Provider Notes (Signed)
CSN: [id number]     Arrival date & time 04/28/14  1907 History   First MD Initiated Contact with Patient 04/28/14 2000     No chief complaint on file.    (Consider location/radiation/quality/duration/timing/severity/associated sxs/prior Treatment) HPI The patient fell in his garage. He reports he lost his balance while turning and reached for it in the door handle to try to support himself however he continued to swing and hand up falling to the floor. He reports that he fell directly on his head. The patient denies that he had loss of consciousness. He did cut his forehead. The patient denies he has any associated headache. He denies any associated visual changes or nausea or vomiting. Patient denies other associated injury. He reports that he was able to walk and do usual activities as expected. There is no weakness numbness or tingling of extremities. He reports he did have a knee repair done in the not too distant past on his right knee. He reports his functioning as previously. Past Medical History  Diagnosis Date  . Dysphagia     with solids  . Esophageal stricture   . Diverticulosis   . Osteopenia   . Hemorrhoids   . High cholesterol   . Heart murmur   . H/O hiatal hernia   . Hypertension   . Atrial flutter     typical appearing by ekg. s/p ablation 07/28/11  . Atrial fibrillation     remotely   . Sinus bradycardia     asymptomatic  . Stroke 2012    "left me weaker on left side; balance is not good"  . GERD (gastroesophageal reflux disease)   . Seizures     "back w/migraine headaches; 1990's or before"  . Arthritis     "right hip; right knee; lower back ~ 1/2 way across"  . Anemia   . SSS (sick sinus syndrome)    Past Surgical History  Procedure Laterality Date  . Asd repair  [id number]    Patch repair  . Knee arthroscopy      right; "maybe twice"  . Inguinal hernia repair  2011    right  . Cardiac electrophysiology study and ablation  07/28/11  . Total knee  arthroplasty  07/10/2012    Procedure: TOTAL KNEE ARTHROPLASTY;  Surgeon: Tobi Bastos, MD;  Location: [facility];  Service: Orthopedics;  Laterality: Right;  . Joint replacement      right knee replacement  . Carotid doppler  06/26/2011    Bilateral ICAs-demonstrated normal patency without eivdence of significant diameter reduction, dissection, or any other vascular abnormality.  . Cardiovascular stress test  03/07/2010    Perfusion defect in the inferior myocardial region is consistent with diaphragmatic attenuation. No ischemia or infarct/scar is seen in the remaining myocardium. No ECG changes.  . Transthoracic echocardiogram  12/10/2012    EF 55-60%. No regional wall motion abnormalities. LA moderately dialted.Mild-moderate regurg of the tricuspid valve.   Family History  Problem Relation Age of Onset  . Heart disease Father   . Cancer Sister     unsure what type   History  Substance Use Topics  . Smoking status: Never Smoker   . Smokeless tobacco: Never Used  . Alcohol Use: 0.0 oz/week     Comment: 07/28/11 "1/5 will last me a year; w/company"    Review of Systems  10 Systems reviewed and are negative for acute change except as noted in the HPI.   Allergies  Review of patient's allergies  indicates no known allergies.  Home Medications   Prior to Admission medications   Medication Sig Start Date End Date Taking? Authorizing Provider  amitriptyline (ELAVIL) 10 MG tablet Take 1 tablet (10 mg total) by mouth at bedtime. 12/30/12  Yes Elby Showers, MD  atorvastatin (LIPITOR) 20 MG tablet Take 1 tablet (20 mg total) by mouth daily. 01/16/14  Yes Elby Showers, MD  bimatoprost (LUMIGAN) 0.03 % ophthalmic solution Place 1 drop into both eyes at bedtime. For 3 weeks, then return for a check up. Starting on 03/13/2014.   Yes Historical Provider, MD  doxazosin (CARDURA) 4 MG tablet Take 4 mg by mouth at bedtime.   Yes Historical Provider, MD  ferrous sulfate 325 (65 FE) MG tablet Take 325  mg by mouth 2 (two) times daily after a meal.    Yes Historical Provider, MD  finasteride (PROSCAR) 5 MG tablet Take 1 tablet (5 mg total) by mouth daily. 10/03/13  Yes Elby Showers, MD  gabapentin (NEURONTIN) 300 MG capsule Take 300 mg by mouth 2 (two) times daily.   Yes Historical Provider, MD  levothyroxine (SYNTHROID, LEVOTHROID) 50 MCG tablet Take 1 tablet (50 mcg total) by mouth daily. 04/25/14  Yes Elby Showers, MD  Multiple Vitamin (MULTIVITAMIN WITH MINERALS) TABS tablet Take 1 tablet by mouth daily.   Yes Historical Provider, MD  naproxen sodium (ALEVE) 220 MG tablet Take 220 mg by mouth daily as needed (arthritis pain).   Yes Historical Provider, MD  phenytoin (DILANTIN) 100 MG ER capsule Take 300 mg by mouth at bedtime.   Yes Historical Provider, MD  Polyethyl Glycol-Propyl Glycol (SYSTANE ULTRA) 0.4-0.3 % SOLN Place 1 drop into both eyes daily.   Yes Historical Provider, MD  warfarin (COUMADIN) 5 MG tablet Take 2.5-5 mg by mouth daily with supper. Take 1/2 tablet (2.5 mg) on Monday and Friday, take 1 tablet (5 mg) on Sunday, Tuesday, Wednesday, Thursday and Saturday   Yes Historical Provider, MD  tamsulosin (FLOMAX) 0.4 MG CAPS capsule Take 0.4 mg by mouth daily.    Historical Provider, MD   BP 139/61 mmHg  Pulse 61  Temp(Src) 98.2 F (36.8 C)  Resp 20  Ht 5\' 9"  (1.753 m)  Wt 133 lb (60.328 kg)  BMI 19.63 kg/m2  SpO2 98% Physical Exam  Constitutional: He is oriented to person, place, and time. He appears well-developed and well-nourished.  HENT:  The patient has a laceration over the left brow. It orients obliquely in a crescent that is approximately 2 cm in length. There is no associated bleeding. There is gaping.  Eyes: EOM are normal. Pupils are equal, round, and reactive to light.  Neck: Neck supple.  Cardiovascular: Normal rate, regular rhythm, normal heart sounds and intact distal pulses.   Pulmonary/Chest: Effort normal and breath sounds normal.  Abdominal: Soft.  Bowel sounds are normal. He exhibits no distension. There is no tenderness.  Musculoskeletal: Normal range of motion. He exhibits no edema or tenderness.  Neurological: He is alert and oriented to person, place, and time. He has normal strength. No cranial nerve deficit. Coordination normal. GCS eye subscore is 4. GCS verbal subscore is 5. GCS motor subscore is 6.  Skin: Skin is warm, dry and intact.  Psychiatric: He has a normal mood and affect.    ED Course  Procedures (including critical care time) Labs Review Labs Reviewed  PROTIME-INR - Abnormal; Notable for the following:    Prothrombin Time 30.2 (*)  INR 2.85 (*)    All other components within normal limits    Imaging Review Ct Head Wo Contrast  04/28/2014   CLINICAL DATA:  Fall with forehead laceration, known Coumadin use  EXAM: CT HEAD WITHOUT CONTRAST  TECHNIQUE: Contiguous axial images were obtained from the base of the skull through the vertex without intravenous contrast.  COMPARISON:  03/23/2014  FINDINGS: Bony calvarium is intact. Left frontal soft tissue injury is noted consistent with a recent history. Atrophic changes and chronic white matter ischemic change is noted similar to that seen on the prior exam. No acute hemorrhage, acute infarction or space-occupying mass lesion is noted.  IMPRESSION: Chronic atrophic and ischemic changes without acute abnormality.   Electronically Signed   By: Inez Catalina M.D.   On: 04/28/2014 23:41     EKG Interpretation None      MDM   Final diagnoses:  Facial laceration, initial encounter  Anticoagulated on Coumadin  Head injury without concussion or intracranial hemorrhage, initial encounter  Fall at home, initial encounter   The patient is on Coumadin. His INR is therapeutic. Patient has taken a fall which resulted in for head laceration. He denies loss of consciousness. CT does not show any intracranial injury. Patient is not endorsing headache. The laceration is repaired by  61 assistant. The patient does have a family member with him for further observation. The patient will be given instructions for any evidence of delayed bleeding. At this point his mental status is clear and he has normal neurologic function.    Charlesetta Shanks, MD 04/29/14 [phone]

## 2014-04-28 NOTE — ED Notes (Signed)
Pt st's he fell down 2 steps and struck his head on concrete.  Pt denies LOC. Has lact to forehead.  Pt is currently taking coumadin

## 2014-04-28 NOTE — ED Notes (Signed)
Suture cart at [facility].

## 2014-04-29 DIAGNOSIS — S0181XA Laceration without foreign body of other part of head, initial encounter: Secondary | ICD-10-CM | POA: Diagnosis not present

## 2014-04-29 NOTE — Discharge Instructions (Signed)
Facial Laceration ° A facial laceration is a cut on the face. These injuries can be painful and cause bleeding. Lacerations usually heal quickly, but they need special care to reduce scarring. °DIAGNOSIS  °Your health care provider will take a medical history, ask for details about how the injury occurred, and examine the wound to determine how deep the cut is. °TREATMENT  °Some facial lacerations may not require closure. Others may not be able to be closed because of an increased risk of infection. The risk of infection and the chance for successful closure will depend on various factors, including the amount of time since the injury occurred. °The wound may be cleaned to help prevent infection. If closure is appropriate, pain medicines may be given if needed. Your health care provider will use stitches (sutures), wound glue (adhesive), or skin adhesive strips to repair the laceration. These tools bring the skin edges together to allow for faster healing and a better cosmetic outcome. If needed, you may also be given a tetanus shot. °HOME CARE INSTRUCTIONS °· Only take over-the-counter or prescription medicines as directed by your health care provider. °· Follow your health care provider's instructions for wound care. These instructions will vary depending on the technique used for closing the wound. °For Sutures: °· Keep the wound clean and dry.   °· If you were given a bandage (dressing), you should change it at least once a day. Also change the dressing if it becomes wet or dirty, or as directed by your health care provider.   °· Wash the wound with soap and water 2 times a day. Rinse the wound off with water to remove all soap. Pat the wound dry with a clean towel.   °· After cleaning, apply a thin layer of the antibiotic ointment recommended by your health care provider. This will help prevent infection and keep the dressing from sticking.   °· You may shower as usual after the first 24 hours. Do not soak the  wound in water until the sutures are removed.   °· Get your sutures removed as directed by your health care provider. With facial lacerations, sutures should usually be taken out after 4-5 days to avoid stitch marks.   °· Wait a few days after your sutures are removed before applying any makeup. °For Skin Adhesive Strips: °· Keep the wound clean and dry.   °· Do not get the skin adhesive strips wet. You may bathe carefully, using caution to keep the wound dry.   °· If the wound gets wet, pat it dry with a clean towel.   °· Skin adhesive strips will fall off on their own. You may trim the strips as the wound heals. Do not remove skin adhesive strips that are still stuck to the wound. They will fall off in time.   °For Wound Adhesive: °· You may briefly wet your wound in the shower or bath. Do not soak or scrub the wound. Do not swim. Avoid periods of heavy sweating until the skin adhesive has fallen off on its own. After showering or bathing, gently pat the wound dry with a clean towel.   °· Do not apply liquid medicine, cream medicine, ointment medicine, or makeup to your wound while the skin adhesive is in place. This may loosen the film before your wound is healed.   °· If a dressing is placed over the wound, be careful not to apply tape directly over the skin adhesive. This may cause the adhesive to be pulled off before the wound is healed.   °· Avoid   prolonged exposure to sunlight or tanning lamps while the skin adhesive is in place.  The skin adhesive will usually remain in place for 5-10 days, then naturally fall off the skin. Do not pick at the adhesive film.  After Healing: Once the wound has healed, cover the wound with sunscreen during the day for 1 full year. This can help minimize scarring. Exposure to ultraviolet light in the first year will darken the scar. It can take 1-2 years for the scar to lose its redness and to heal completely.  SEEK IMMEDIATE MEDICAL CARE IF:  You have redness, pain, or  swelling around the wound.   You see ayellowish-white fluid (pus) coming from the wound.   You have chills or a fever.  MAKE SURE YOU:  Understand these instructions.  Will watch your condition.  Will get help right away if you are not doing well or get worse. Document Released: 07/06/2004 Document Revised: 03/19/2013 Document Reviewed: 01/09/2013 [facility] Patient Information 2015 [place], [place]. This information is not intended to replace advice given to you by your health care provider. Make sure you discuss any questions you have with your health care provider. Head Injury You have received a head injury. It does not appear serious at this time. Headaches and vomiting are common following head injury. It should be easy to awaken from sleeping. Sometimes it is necessary for you to stay in the emergency department for a while for observation. Sometimes admission to the hospital may be needed. After injuries such as yours, most problems occur within the first 24 hours, but side effects may occur up to 7-10 days after the injury. It is important for you to carefully monitor your condition and contact your health care provider or seek immediate medical care if there is a change in your condition. WHAT ARE THE TYPES OF HEAD INJURIES? Head injuries can be as minor as a bump. Some head injuries can be more severe. More severe head injuries include:  A jarring injury to the brain (concussion).  A bruise of the brain (contusion). This mean there is bleeding in the brain that can cause swelling.  A cracked skull (skull fracture).  Bleeding in the brain that collects, clots, and forms a bump (hematoma). WHAT CAUSES A HEAD INJURY? A serious head injury is most likely to happen to someone who is in a car wreck and is not wearing a seat belt. Other causes of major head injuries include bicycle or motorcycle accidents, sports injuries, and falls. HOW ARE HEAD INJURIES DIAGNOSED? A complete  history of the event leading to the injury and your current symptoms will be helpful in diagnosing head injuries. Many times, pictures of the brain, such as CT or MRI are needed to see the extent of the injury. Often, an overnight hospital stay is necessary for observation.  WHEN SHOULD I SEEK IMMEDIATE MEDICAL CARE?  You should get help right away if:  You have confusion or drowsiness.  You feel sick to your stomach (nauseous) or have continued, forceful vomiting.  You have dizziness or unsteadiness that is getting worse.  You have severe, continued headaches not relieved by medicine. Only take over-the-counter or prescription medicines for pain, fever, or discomfort as directed by your health care provider.  You do not have normal function of the arms or legs or are unable to walk.  You notice changes in the black spots in the center of the colored part of your eye (pupil).  You have a clear or  bloody fluid coming from your nose or ears.  You have a loss of vision. During the next 24 hours after the injury, you must stay with someone who can watch you for the warning signs. This person should contact local emergency services (911 in the [place] if you have seizures, you become unconscious, or you are unable to wake up. HOW CAN I PREVENT A HEAD INJURY IN THE FUTURE? The most important factor for preventing major head injuries is avoiding motor vehicle accidents. To minimize the potential for damage to your head, it is crucial to wear seat belts while riding in motor vehicles. Wearing helmets while bike riding and playing collision sports (like football) is also helpful. Also, avoiding dangerous activities around the house will further help reduce your risk of head injury.  WHEN CAN I RETURN TO NORMAL ACTIVITIES AND ATHLETICS? You should be reevaluated by your health care provider before returning to these activities. If you have any of the following symptoms, you should not return to  activities or contact sports until 1 week after the symptoms have stopped:  Persistent headache.  Dizziness or vertigo.  Poor attention and concentration.  Confusion.  Memory problems.  Nausea or vomiting.  Fatigue or tire easily.  Irritability.  Intolerant of bright lights or loud noises.  Anxiety or depression.  Disturbed sleep. MAKE SURE YOU:   Understand these instructions.  Will watch your condition.  Will get help right away if you are not doing well or get worse. Document Released: 05/29/2005 Document Revised: 06/03/2013 Document Reviewed: 02/03/2013 [facility] Patient Information 2015 [place], [place]. This information is not intended to replace advice given to you by your health care provider. Make sure you discuss any questions you have with your health care provider.

## 2014-05-01 ENCOUNTER — Encounter: Payer: Self-pay | Admitting: Internal Medicine

## 2014-05-01 ENCOUNTER — Ambulatory Visit (INDEPENDENT_AMBULATORY_CARE_PROVIDER_SITE_OTHER): Payer: 59 | Admitting: Internal Medicine

## 2014-05-01 VITALS — BP 116/72 | HR 54 | Temp 97.3°F | Ht 69.0 in | Wt 133.0 lb

## 2014-05-01 DIAGNOSIS — E039 Hypothyroidism, unspecified: Secondary | ICD-10-CM

## 2014-05-01 DIAGNOSIS — K5900 Constipation, unspecified: Secondary | ICD-10-CM

## 2014-05-01 DIAGNOSIS — R296 Repeated falls: Secondary | ICD-10-CM | POA: Diagnosis not present

## 2014-05-01 DIAGNOSIS — S0181XS Laceration without foreign body of other part of head, sequela: Secondary | ICD-10-CM | POA: Diagnosis not present

## 2014-05-01 DIAGNOSIS — Z9181 History of falling: Secondary | ICD-10-CM

## 2014-05-01 NOTE — Patient Instructions (Signed)
Return on Tuesday, November 24. Take Miralax for constipation. Take thyroid replacement medication at bedtime.

## 2014-05-01 NOTE — Progress Notes (Signed)
   Subjective:    Patient ID: Jay Walters, male    DOB: 11/18/1931, 80 y.o.   MRN: [id number]  HPI  Patient sustained a fall at home on November 17. He went to the emergency department. He had a laceration over his left eye with hematoma. He has a periorbital hematoma nail. He had CT scan which showed no intracranial bleed. He is here today for routine 4 month follow-up. He has been diagnosed with hypothyroidism. TSH checked today shows elevated TSH. He is supposed to be on levothyroxine 0.05 milligrams daily. Son says he has not started taking it yet. Needs to find a time he can take it only at the stomach with no other medications which I think may be bedtime for him. We went over this today. He is alert and oriented. This is his second fall in the last several months. This time he says that his right leg did not lift up to help him get out a doorway. Says the brace he had on his leg hindered that. He grabbed the garage door. He thought it was locked and instead it flew open and he went through the door striking his head. He said he had no loss of consciousness. He had no confusion.  He has 6 sutures over his left eye.  He is complaining of constipation on iron therapy.    Review of Systems     Objective:   Physical Exam  6 sutures look to be in place. Laceration healing well. He is alert and oriented. Chest clear to auscultation. Cardiac exam regular rate and rhythm.      Assessment & Plan:  Hypothyroidism-needs to start thyroid replacement  Laceration and hematoma left forehead.  Periorbital hematoma left eye  High risk fall  Constipation-prescribed Miralax  Plan: He is to return Tuesday, November 24 for suture removal. Will need to be on thyroid replacement for 8 weeks before we recheck TSH.  25 minutes spent with patient

## 2014-05-04 ENCOUNTER — Ambulatory Visit: Payer: 59 | Admitting: Internal Medicine

## 2014-05-05 ENCOUNTER — Encounter: Payer: Self-pay | Admitting: Internal Medicine

## 2014-05-05 ENCOUNTER — Ambulatory Visit (INDEPENDENT_AMBULATORY_CARE_PROVIDER_SITE_OTHER): Payer: 59 | Admitting: Internal Medicine

## 2014-05-05 VITALS — BP 116/62 | HR 54 | Temp 97.3°F | Ht 69.0 in | Wt 133.0 lb

## 2014-05-05 DIAGNOSIS — Z4802 Encounter for removal of sutures: Secondary | ICD-10-CM

## 2014-05-05 NOTE — Progress Notes (Signed)
   Subjective:    Patient ID: Jay Walters, male    DOB: [birth year], 80 y.o.   MRN: [id number]  HPI  In today for suture removal for laceration left face that occurred with a fall recently. Seen in the emergency department November 17 for fall and had laceration repair there.    Review of Systems     Objective:   Physical Exam  6 Prolene sutures removed from left forehead without difficulty. Laceration is well-healed      Assessment & Plan:  Laceration forehead  Plan: Apply antibacterial ointment for a few days and keep area clean and dry.

## 2014-05-05 NOTE — Patient Instructions (Signed)
Keep laceration clean and dry and apply antibacterial ointment twice daily for 7 days.

## 2014-05-21 ENCOUNTER — Encounter (HOSPITAL_COMMUNITY): Payer: Self-pay | Admitting: Internal Medicine

## 2014-06-17 ENCOUNTER — Ambulatory Visit: Payer: 59 | Admitting: Internal Medicine

## 2014-06-19 ENCOUNTER — Ambulatory Visit (INDEPENDENT_AMBULATORY_CARE_PROVIDER_SITE_OTHER): Payer: Medicare Other | Admitting: Pharmacist Clinician (PhC)/ Clinical Pharmacy Specialist

## 2014-06-19 DIAGNOSIS — Z7901 Long term (current) use of anticoagulants: Secondary | ICD-10-CM

## 2014-06-19 DIAGNOSIS — I4892 Unspecified atrial flutter: Secondary | ICD-10-CM

## 2014-06-19 LAB — POCT INR: INR: 2.3

## 2014-06-20 NOTE — Patient Instructions (Signed)
Anemia studies to be performed. Be careful around the house. High risk fall.

## 2014-06-24 ENCOUNTER — Encounter: Payer: Self-pay | Admitting: Internal Medicine

## 2014-07-07 ENCOUNTER — Ambulatory Visit: Payer: Medicare Other | Admitting: Internal Medicine

## 2014-07-10 ENCOUNTER — Encounter: Payer: Self-pay | Admitting: Internal Medicine

## 2014-07-10 ENCOUNTER — Ambulatory Visit (INDEPENDENT_AMBULATORY_CARE_PROVIDER_SITE_OTHER): Payer: Medicare Other | Admitting: Internal Medicine

## 2014-07-10 ENCOUNTER — Ambulatory Visit (INDEPENDENT_AMBULATORY_CARE_PROVIDER_SITE_OTHER): Payer: Medicare Other | Admitting: Pharmacist Clinician (PhC)/ Clinical Pharmacy Specialist

## 2014-07-10 VITALS — BP 126/62 | HR 52 | Ht 70.0 in | Wt 138.4 lb

## 2014-07-10 DIAGNOSIS — Z9889 Other specified postprocedural states: Secondary | ICD-10-CM

## 2014-07-10 DIAGNOSIS — W19XXXS Unspecified fall, sequela: Secondary | ICD-10-CM

## 2014-07-10 DIAGNOSIS — Z8774 Personal history of (corrected) congenital malformations of heart and circulatory system: Secondary | ICD-10-CM

## 2014-07-10 DIAGNOSIS — I4892 Unspecified atrial flutter: Secondary | ICD-10-CM

## 2014-07-10 DIAGNOSIS — I10 Essential (primary) hypertension: Secondary | ICD-10-CM

## 2014-07-10 DIAGNOSIS — Z8679 Personal history of other diseases of the circulatory system: Secondary | ICD-10-CM

## 2014-07-10 DIAGNOSIS — M25361 Other instability, right knee: Secondary | ICD-10-CM

## 2014-07-10 DIAGNOSIS — Z7901 Long term (current) use of anticoagulants: Secondary | ICD-10-CM

## 2014-07-10 DIAGNOSIS — Z96651 Presence of right artificial knee joint: Secondary | ICD-10-CM

## 2014-07-10 LAB — POCT INR: INR: 1.8

## 2014-07-10 NOTE — Progress Notes (Signed)
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 He was previously followed by Dr. Rollene Fare. At that time, he was seen and was in atrial flutter and felt to be potentially a candidate for A-flutter ablation due to his bradycardia; however, the timing was such that it was recommended that this would be postponed until after he has recovered. He also has a history of a remote ASD repair and was on Coumadin in the past but has not been on that recently. He was restarted on Coumadin after there was time to allow for recovery from the stroke. He did have a CT angiogram of the neck which showed question of thrombus or atherosclerotic plaque at the carotid bifurcation and an ultrasound of the neck was repeated in our office on June 26, 2011, which demonstrated no significant carotid stenosis.  Jay Walters returns today for follow-up. He's had some back injections for pain but he reports only last for about 1 day. He's also had problems with his right knee. This was replaced over year ago at [place] and he reports instability and pain in that knee. He's had falls recently including a fall that caused a laceration above his left eyebrow which was sutured. His head was scanned and fortunately there was no intracranial bleeding on warfarin. In October he was in the hospital for what was thought to be a syncopal event however he feels that he fell due to tripping on the carpet. He denies any loss of consciousness. Heart rate was low in the 40s and he's had problems with bradycardia in the past. There is been some medication adjustments and he's felt pretty well since then. He was supposed to get a follow-up monitor but that was never arranged.  PMHx:  Past Medical History  Diagnosis Date  . Dysphagia    with solids  . Esophageal stricture   . Diverticulosis   . Osteopenia   . Hemorrhoids   . High cholesterol   . Heart murmur   . H/O hiatal hernia   . Hypertension   . Atrial flutter     typical appearing by ekg. s/p ablation 07/28/11  . Atrial fibrillation     remotely   . Sinus bradycardia     asymptomatic  . Stroke 2012    "left me weaker on left side; balance is not good"  . GERD (gastroesophageal reflux disease)   . Seizures     "back w/migraine headaches; 1990's or before"  . Arthritis     "right hip; right knee; lower back ~ 1/2 way across"  . Anemia   . SSS (sick sinus syndrome)     Past Surgical History  Procedure Laterality Date  . Asd repair  [id number]    Patch repair  . Knee arthroscopy      right; "maybe twice"  . Inguinal hernia repair  2011    right  . Cardiac electrophysiology study and ablation  07/28/11  . Total knee arthroplasty  07/10/2012    Procedure: TOTAL KNEE ARTHROPLASTY;  Surgeon: Tobi Bastos, MD;  Location: [facility];  Service: Orthopedics;  Laterality: Right;  . Joint replacement      right knee replacement  . Carotid doppler  06/26/2011    Bilateral ICAs-demonstrated normal patency without eivdence of significant diameter reduction, dissection, or  any other vascular abnormality.  . Cardiovascular stress test  03/07/2010    Perfusion defect in the inferior myocardial region is consistent with diaphragmatic attenuation. No ischemia or infarct/scar is seen in the remaining myocardium. No ECG changes.  . Transthoracic echocardiogram  12/10/2012    EF 55-60%. No regional wall motion abnormalities. LA moderately dialted.Mild-moderate regurg of the tricuspid valve.  . Atrial flutter ablation N/A 07/28/2011    Procedure: ATRIAL FLUTTER ABLATION;  Surgeon: Thompson Grayer, MD;  Location: [facility] CATH LAB;  Service: Cardiovascular;  Laterality: N/A;    FAMHx:  Family History  Problem Relation Age of Onset  . Heart disease Father   . Cancer Sister     unsure what  type    SOCHx:   reports that he has never smoked. He has never used smokeless tobacco. He reports that he drinks alcohol. He reports that he does not use illicit drugs.  ALLERGIES:  No Known Allergies  ROS: A comprehensive review of systems was negative except for: Musculoskeletal: positive for back pain and knee pain Neurological: positive for coordination problems and dizziness  HOME MEDS: Current Outpatient Prescriptions  Medication Sig Dispense Refill  . amitriptyline (ELAVIL) 10 MG tablet Take 1 tablet (10 mg total) by mouth at bedtime. 30 tablet 5  . atorvastatin (LIPITOR) 20 MG tablet Take 1 tablet (20 mg total) by mouth daily. 90 tablet 3  . bimatoprost (LUMIGAN) 0.03 % ophthalmic solution Place 1 drop into both eyes at bedtime. For 3 weeks, then return for a check up. Starting on 03/13/2014.    Marland Kitchen doxazosin (CARDURA) 4 MG tablet Take 4 mg by mouth at bedtime.    . ferrous sulfate 325 (65 FE) MG tablet Take 325 mg by mouth 2 (two) times daily after a meal.     . finasteride (PROSCAR) 5 MG tablet Take 1 tablet (5 mg total) by mouth daily. 30 tablet 11  . gabapentin (NEURONTIN) 300 MG capsule Take 300 mg by mouth 2 (two) times daily.    Marland Kitchen levothyroxine (SYNTHROID, LEVOTHROID) 50 MCG tablet Take 1 tablet (50 mcg total) by mouth daily. 90 tablet 0  . Multiple Vitamin (MULTIVITAMIN WITH MINERALS) TABS tablet Take 1 tablet by mouth daily.    . naproxen sodium (ALEVE) 220 MG tablet Take 220 mg by mouth daily as needed (arthritis pain).    . phenytoin (DILANTIN) 100 MG ER capsule Take 300 mg by mouth at bedtime.    Vladimir Faster Glycol-Propyl Glycol (SYSTANE ULTRA) 0.4-0.3 % SOLN Place 1 drop into both eyes daily.    Marland Kitchen warfarin (COUMADIN) 5 MG tablet Take 2.5-5 mg by mouth daily with supper. Take 1/2 tablet (2.5 mg) on Monday and Friday, take 1 tablet (5 mg) on Sunday, Tuesday, Wednesday, Thursday and Saturday     No current facility-administered medications for this visit.     LABS/IMAGING: No results found for this or any previous visit (from the past 48 hour(s)). No results found.  VITALS: BP 126/62 mmHg  Pulse 52  Ht 5\' 10"  (1.778 m)  Wt 138 lb 6.4 oz (62.778 kg)  BMI 19.86 kg/m2  EXAM: General appearance: alert and no distress Neck: no carotid bruit, no JVD and thyroid not enlarged, symmetric, no tenderness/mass/nodules Lungs: clear to auscultation bilaterally Heart: regular rate and rhythm, S1, S2 normal, no murmur, click, rub or gallop Abdomen: soft, non-tender; bowel sounds normal; no masses,  no organomegaly Extremities: extremities normal, atraumatic, no cyanosis or edema Pulses: 2+ and symmetric Skin: Skin color,  texture, turgor normal. No rashes or lesions Neurologic: Mental status: Alert, oriented, thought content appropriate PSych: Normal  EKG: Sinus bradycardia with first-degree AV block at 52  ASSESSMENT: 1. Paroxysmal atrial fibrillation status post ablation, maintaining sinus 2. Hypertension 3. Prior stroke with aphasia and balance problems 4. Ongoing low back pain 5. Chronic anticoagulation on warfarin 6. History of atrial septal defect status post patch repair  PLAN: 1.   Jay Walters is doing well from a cardiac standpoint. It's not clear to me whether he had a true syncopal episode or whether his low heart rate was related to his fall. He's not had any further atrial fibrillation that we are aware of. He's had no further falls. I think that there is probably little utility in using a monitor at this time. We do need to consider long-term anticoagulation options if he continues to have falls. Should he have another event which is clearly syncopal would consider monitoring and/or loop recorder. We will plan to continue his current medications. Adjust warfarin based on INR. Finally he's asking for second opinion regarding right knee pain and instability. He had surgery over year ago and is not pleased with the results. I will refer  him to Dr. Marlou Sa at [facility] orthopedics.  Pixie Casino, MD, [facility] Attending Cardiologist [facility]  Meggan Dhaliwal C 07/10/2014, 9:38 AM

## 2014-07-10 NOTE — Patient Instructions (Addendum)
You have been referred to Dr. [name] (orthopaedist) - [place] wants you to follow-up in: 6 months with Dr. Debara Pickett.  You will receive a reminder letter in the mail two months in advance. If you don't receive a letter, please call our office to schedule the follow-up appointment.

## 2014-07-31 ENCOUNTER — Ambulatory Visit: Payer: Medicare Other | Admitting: Pharmacist Clinician (PhC)/ Clinical Pharmacy Specialist

## 2014-07-31 ENCOUNTER — Ambulatory Visit (INDEPENDENT_AMBULATORY_CARE_PROVIDER_SITE_OTHER): Payer: Medicare Other | Admitting: Pharmacist Clinician (PhC)/ Clinical Pharmacy Specialist

## 2014-07-31 DIAGNOSIS — I4892 Unspecified atrial flutter: Secondary | ICD-10-CM

## 2014-07-31 DIAGNOSIS — Z7901 Long term (current) use of anticoagulants: Secondary | ICD-10-CM

## 2014-07-31 LAB — POCT INR: INR: 2.8

## 2014-08-03 ENCOUNTER — Other Ambulatory Visit: Payer: Self-pay | Admitting: Orthopedic Surgery

## 2014-08-03 DIAGNOSIS — M25561 Pain in right knee: Secondary | ICD-10-CM

## 2014-08-07 ENCOUNTER — Encounter: Payer: Self-pay | Admitting: Internal Medicine

## 2014-08-07 ENCOUNTER — Other Ambulatory Visit: Payer: Medicare Other

## 2014-08-07 ENCOUNTER — Ambulatory Visit
Admission: RE | Admit: 2014-08-07 | Discharge: 2014-08-07 | Disposition: A | Discharge status: Home / Self Care | Payer: Medicare Other | Source: Ambulatory Visit | Attending: Orthopedic Surgery | Admitting: Orthopedic Surgery

## 2014-08-07 DIAGNOSIS — Z471 Aftercare following joint replacement surgery: Secondary | ICD-10-CM | POA: Diagnosis not present

## 2014-08-07 DIAGNOSIS — S82091A Other fracture of right patella, initial encounter for closed fracture: Secondary | ICD-10-CM | POA: Diagnosis not present

## 2014-08-07 DIAGNOSIS — Z96651 Presence of right artificial knee joint: Secondary | ICD-10-CM | POA: Diagnosis not present

## 2014-08-07 DIAGNOSIS — M25561 Pain in right knee: Secondary | ICD-10-CM

## 2014-08-21 ENCOUNTER — Encounter: Payer: Self-pay | Admitting: Internal Medicine

## 2014-08-21 ENCOUNTER — Ambulatory Visit (INDEPENDENT_AMBULATORY_CARE_PROVIDER_SITE_OTHER): Payer: 59 | Admitting: Internal Medicine

## 2014-08-21 VITALS — BP 150/72 | HR 54 | Temp 98.5°F | Wt 133.0 lb

## 2014-08-21 DIAGNOSIS — R350 Frequency of micturition: Secondary | ICD-10-CM | POA: Diagnosis not present

## 2014-08-21 DIAGNOSIS — J069 Acute upper respiratory infection, unspecified: Secondary | ICD-10-CM | POA: Diagnosis not present

## 2014-08-21 LAB — POCT URINALYSIS DIPSTICK
Bilirubin, UA: NEGATIVE
Glucose, UA: NEGATIVE
KETONES UA: NEGATIVE
Leukocytes, UA: NEGATIVE
Nitrite, UA: NEGATIVE
RBC UA: NEGATIVE
UROBILINOGEN UA: NEGATIVE
pH, UA: 8

## 2014-08-21 MED ORDER — AZITHROMYCIN 250 MG PO TABS: ORAL_TABLET | ORAL | Status: DC

## 2014-08-26 ENCOUNTER — Telehealth: Payer: Self-pay | Admitting: Internal Medicine

## 2014-08-26 ENCOUNTER — Telehealth: Payer: Self-pay | Admitting: *Deleted

## 2014-08-26 NOTE — Telephone Encounter (Signed)
Reviewed instructions with patient .

## 2014-08-26 NOTE — Telephone Encounter (Signed)
There is nothing else I can do for knee. Do not recommend any decongestants since it may aggravate his bladder. We gave him an antibiotic. He needs to give this URI a couple of weeks to resolve.

## 2014-08-26 NOTE — Telephone Encounter (Signed)
States he doesn't remember if he mentioned to you the other that he has head congestion (nothing in the chest).  What he's able to move out of his nasal passages is clear.  Wants to know if he can have something for this.  And, states his knee is also bothering him and doesn't remember if he mentioned that to you either.    Does he need to come back and see you?    Pharmacy:  [facility]

## 2014-08-28 ENCOUNTER — Ambulatory Visit (INDEPENDENT_AMBULATORY_CARE_PROVIDER_SITE_OTHER): Payer: 59 | Admitting: Pharmacist Clinician (PhC)/ Clinical Pharmacy Specialist

## 2014-08-28 DIAGNOSIS — I4892 Unspecified atrial flutter: Secondary | ICD-10-CM | POA: Diagnosis not present

## 2014-08-28 DIAGNOSIS — Z7901 Long term (current) use of anticoagulants: Secondary | ICD-10-CM

## 2014-08-28 LAB — POCT INR: INR: 1.9

## 2014-09-06 NOTE — Patient Instructions (Signed)
Take Zithromax Z-PAK as directed for respiratory infection. No evidence of urinary tract infection today.

## 2014-09-06 NOTE — Progress Notes (Signed)
   Subjective:    Patient ID: Jay Walters, male    DOB: 10-03-1931, 81 y.o.   MRN: [id number]  HPI   81 year old Black Male in today with acute respiratory infection symptoms. Has had some nasal congestion. No fever or shaking chills. Also he would like to have his urine checked. History of urinary tract infections.   Review of Systems     Objective:   Physical Exam  Skin warm and dry. Nodes none. TMs and pharynx are clear. Neck supple. Chest clear to auscultation without rales or wheezing. Urinalysis is normal without evidence of infection      Assessment & Plan:  Acute URI  History of urinary infections-urinalysis is normal today  Plan: Zithromax Z-PAK take 2 tablets day one followed by 1 tablet days 2 through 5

## 2014-09-16 ENCOUNTER — Other Ambulatory Visit: Payer: Self-pay | Admitting: Pharmacist Clinician (PhC)/ Clinical Pharmacy Specialist

## 2014-09-25 ENCOUNTER — Ambulatory Visit (INDEPENDENT_AMBULATORY_CARE_PROVIDER_SITE_OTHER): Payer: 59 | Admitting: Pharmacist Clinician (PhC)/ Clinical Pharmacy Specialist

## 2014-09-25 DIAGNOSIS — I4892 Unspecified atrial flutter: Secondary | ICD-10-CM | POA: Diagnosis not present

## 2014-09-25 DIAGNOSIS — Z7901 Long term (current) use of anticoagulants: Secondary | ICD-10-CM | POA: Diagnosis not present

## 2014-09-25 LAB — POCT INR: INR: 1.2

## 2014-10-09 ENCOUNTER — Ambulatory Visit: Payer: Medicare Other | Admitting: Pharmacist Clinician (PhC)/ Clinical Pharmacy Specialist

## 2014-10-12 ENCOUNTER — Telehealth: Payer: Self-pay | Admitting: Pharmacist Clinician (PhC)/ Clinical Pharmacy Specialist

## 2014-10-13 NOTE — Telephone Encounter (Signed)
Close encounter 

## 2014-10-15 ENCOUNTER — Ambulatory Visit (INDEPENDENT_AMBULATORY_CARE_PROVIDER_SITE_OTHER): Payer: Medicare Other | Admitting: Pharmacist Clinician (PhC)/ Clinical Pharmacy Specialist

## 2014-10-15 DIAGNOSIS — Z7901 Long term (current) use of anticoagulants: Secondary | ICD-10-CM | POA: Diagnosis not present

## 2014-10-15 DIAGNOSIS — I4892 Unspecified atrial flutter: Secondary | ICD-10-CM | POA: Diagnosis not present

## 2014-10-15 LAB — POCT INR: INR: 1.9

## 2014-10-22 ENCOUNTER — Other Ambulatory Visit: Payer: Self-pay | Admitting: Internal Medicine

## 2014-11-05 ENCOUNTER — Ambulatory Visit (INDEPENDENT_AMBULATORY_CARE_PROVIDER_SITE_OTHER): Payer: 59 | Admitting: Pharmacist Clinician (PhC)/ Clinical Pharmacy Specialist

## 2014-11-05 DIAGNOSIS — Z7901 Long term (current) use of anticoagulants: Secondary | ICD-10-CM | POA: Diagnosis not present

## 2014-11-05 DIAGNOSIS — I4892 Unspecified atrial flutter: Secondary | ICD-10-CM

## 2014-11-05 LAB — POCT INR: INR: 1.6

## 2014-11-06 ENCOUNTER — Other Ambulatory Visit: Payer: Self-pay | Admitting: Pharmacist Clinician (PhC)/ Clinical Pharmacy Specialist

## 2014-11-06 MED ORDER — WARFARIN SODIUM 5 MG PO TABS: ORAL_TABLET | ORAL | Status: DC

## 2014-11-06 MED ORDER — DOXAZOSIN MESYLATE 4 MG PO TABS: 4.0000 mg | ORAL_TABLET | Freq: Every day | ORAL | Status: DC

## 2014-11-19 ENCOUNTER — Ambulatory Visit (INDEPENDENT_AMBULATORY_CARE_PROVIDER_SITE_OTHER): Payer: 59 | Admitting: Pharmacist Clinician (PhC)/ Clinical Pharmacy Specialist

## 2014-11-19 DIAGNOSIS — Z7901 Long term (current) use of anticoagulants: Secondary | ICD-10-CM

## 2014-11-19 DIAGNOSIS — I4892 Unspecified atrial flutter: Secondary | ICD-10-CM

## 2014-11-19 LAB — POCT INR: INR: 2.1

## 2014-12-18 ENCOUNTER — Ambulatory Visit (INDEPENDENT_AMBULATORY_CARE_PROVIDER_SITE_OTHER): Payer: 59 | Admitting: Pharmacist Clinician (PhC)/ Clinical Pharmacy Specialist

## 2014-12-18 DIAGNOSIS — I4892 Unspecified atrial flutter: Secondary | ICD-10-CM

## 2014-12-18 DIAGNOSIS — Z7901 Long term (current) use of anticoagulants: Secondary | ICD-10-CM

## 2014-12-18 LAB — POCT INR: INR: 1.5

## 2015-01-01 ENCOUNTER — Ambulatory Visit (INDEPENDENT_AMBULATORY_CARE_PROVIDER_SITE_OTHER): Payer: 59 | Admitting: Pharmacist Clinician (PhC)/ Clinical Pharmacy Specialist

## 2015-01-01 DIAGNOSIS — I483 Typical atrial flutter: Secondary | ICD-10-CM | POA: Diagnosis not present

## 2015-01-01 DIAGNOSIS — Z7901 Long term (current) use of anticoagulants: Secondary | ICD-10-CM | POA: Diagnosis not present

## 2015-01-01 DIAGNOSIS — I4892 Unspecified atrial flutter: Secondary | ICD-10-CM

## 2015-01-01 LAB — POCT INR: INR: 1.6

## 2015-01-15 ENCOUNTER — Ambulatory Visit (INDEPENDENT_AMBULATORY_CARE_PROVIDER_SITE_OTHER): Payer: 59 | Admitting: Pharmacist Clinician (PhC)/ Clinical Pharmacy Specialist

## 2015-01-15 DIAGNOSIS — I4892 Unspecified atrial flutter: Secondary | ICD-10-CM | POA: Diagnosis not present

## 2015-01-15 DIAGNOSIS — Z7901 Long term (current) use of anticoagulants: Secondary | ICD-10-CM | POA: Diagnosis not present

## 2015-01-15 LAB — POCT INR: INR: 1.8

## 2015-01-29 ENCOUNTER — Ambulatory Visit: Payer: 59 | Admitting: Pharmacist Clinician (PhC)/ Clinical Pharmacy Specialist

## 2015-02-02 ENCOUNTER — Ambulatory Visit
Admission: RE | Admit: 2015-02-02 | Discharge: 2015-02-02 | Disposition: A | Discharge status: Home / Self Care | Payer: 59 | Source: Ambulatory Visit | Attending: Internal Medicine | Admitting: Internal Medicine

## 2015-02-02 ENCOUNTER — Encounter: Payer: Self-pay | Admitting: Internal Medicine

## 2015-02-02 ENCOUNTER — Ambulatory Visit (INDEPENDENT_AMBULATORY_CARE_PROVIDER_SITE_OTHER): Payer: 59 | Admitting: Internal Medicine

## 2015-02-02 VITALS — BP 150/70 | HR 56 | Temp 97.9°F | Wt 144.0 lb

## 2015-02-02 DIAGNOSIS — J449 Chronic obstructive pulmonary disease, unspecified: Secondary | ICD-10-CM | POA: Diagnosis not present

## 2015-02-02 DIAGNOSIS — R609 Edema, unspecified: Secondary | ICD-10-CM

## 2015-02-02 DIAGNOSIS — J984 Other disorders of lung: Secondary | ICD-10-CM | POA: Diagnosis not present

## 2015-02-02 LAB — CBC WITH DIFFERENTIAL/PLATELET
Basophils Absolute: 0.1 10*3/uL (ref 0.0–0.1)
Basophils Relative: 1 % (ref 0–1)
EOS PCT: 2 % (ref 0–5)
Eosinophils Absolute: 0.1 10*3/uL (ref 0.0–0.7)
HEMATOCRIT: 39.9 % (ref 39.0–52.0)
Hemoglobin: 13.5 g/dL (ref 13.0–17.0)
LYMPHS ABS: 2.3 10*3/uL (ref 0.7–4.0)
LYMPHS PCT: 46 % (ref 12–46)
MCH: 31 pg (ref 26.0–34.0)
MCHC: 33.8 g/dL (ref 30.0–36.0)
MCV: 91.5 fL (ref 78.0–100.0)
MONOS PCT: 9 % (ref 3–12)
MPV: 10.3 fL (ref 8.6–12.4)
Monocytes Absolute: 0.5 10*3/uL (ref 0.1–1.0)
Neutro Abs: 2.1 10*3/uL (ref 1.7–7.7)
Neutrophils Relative %: 42 % — ABNORMAL LOW (ref 43–77)
PLATELETS: 173 10*3/uL (ref 150–400)
RBC: 4.36 MIL/uL (ref 4.22–5.81)
RDW: 14.4 % (ref 11.5–15.5)
WBC: 5 10*3/uL (ref 4.0–10.5)

## 2015-02-02 LAB — COMPREHENSIVE METABOLIC PANEL
ALBUMIN: 3.7 g/dL (ref 3.6–5.1)
ALT: 21 U/L (ref 9–46)
AST: 22 U/L (ref 10–35)
Alkaline Phosphatase: 60 U/L (ref 40–115)
BILIRUBIN TOTAL: 0.3 mg/dL (ref 0.2–1.2)
BUN: 15 mg/dL (ref 7–25)
CALCIUM: 9.2 mg/dL (ref 8.6–10.3)
CO2: 27 mmol/L (ref 20–31)
CREATININE: 0.91 mg/dL (ref 0.70–1.11)
Chloride: 103 mmol/L (ref 98–110)
Glucose, Bld: 116 mg/dL — ABNORMAL HIGH (ref 65–99)
Potassium: 4.5 mmol/L (ref 3.5–5.3)
Sodium: 138 mmol/L (ref 135–146)
TOTAL PROTEIN: 6.7 g/dL (ref 6.1–8.1)

## 2015-02-02 NOTE — Progress Notes (Signed)
   Subjective:    Patient ID: Jay Walters, male    DOB: 10-Jan-1932, 81 y.o.   MRN: [id number]  HPI He fell recently outside and sustained abrasion right lateral knee  but was not seriously injured. Says right knee where he had knee replacement is still stiff and sore. He went to see Dr. Marlou Walters at [facility] in February. He had a CT done of the right knee showing a fracture of the superior pole of the patella with multiple large bone fragments identified in the distal quadriceps tendon and patellofemoral compartment consistent with fracture fragments and heterotopic ossification.  Main complaint today is swelling in ankles. This is a new finding he says. Probably doesn't keep his feet elevated as much as he should although he says he tries to keep them elevated a few hours a day. Denies excessive salt consumption. No chest pain. History of atrial fib. Says he's probably not eating enough protein. Tends to eat fruit for breakfast and only eats 2 meals daily. He weighed 144 pounds today. Blood pressure was slightly elevated at 150/70. Previous weight was 133 pounds November 2015 and in March 2016. He doesn't look his Jay Walters which is good.  Denies shortness of breath. Pulse oximetry is normal.    Review of Systems see above     Objective:   Physical Exam Skin warm and dry. Nodes none. No JVD thyromegaly or carotid bruits. Chest clear to auscultation without rales or wheezing. Cardiac exam regular rate and rhythm. Extremities he has some puffiness about his ankles particular posteriorly but no pitting edema of the feet or legs.       Assessment & Plan:  Edema of ankles-etiology unclear  Plan: He is to have chest x-ray, BNP, C met, pre-albumin and CBC with differential. Further recommendations to follow depending on these results.  Addendum: Chest x-ray shows no pleural effusions. My guess is he has some mild dependent edema doesn't keep his feet elevated as much as he should but I'm also  concerned about his protein stores given his diet history.

## 2015-02-02 NOTE — Patient Instructions (Signed)
Lab work is pending as is chest x-ray. Further recommendations to follow depending on results.

## 2015-02-03 ENCOUNTER — Ambulatory Visit (INDEPENDENT_AMBULATORY_CARE_PROVIDER_SITE_OTHER): Payer: 59 | Admitting: Pharmacist Clinician (PhC)/ Clinical Pharmacy Specialist

## 2015-02-03 DIAGNOSIS — I4892 Unspecified atrial flutter: Secondary | ICD-10-CM

## 2015-02-03 DIAGNOSIS — Z7901 Long term (current) use of anticoagulants: Secondary | ICD-10-CM | POA: Diagnosis not present

## 2015-02-03 DIAGNOSIS — I483 Typical atrial flutter: Secondary | ICD-10-CM

## 2015-02-03 LAB — BRAIN NATRIURETIC PEPTIDE: Brain Natriuretic Peptide: 71 pg/mL (ref 0.0–100.0)

## 2015-02-03 LAB — PREALBUMIN: Prealbumin: 25 mg/dL (ref 21–43)

## 2015-02-03 LAB — POCT INR: INR: 1.4

## 2015-02-04 ENCOUNTER — Telehealth: Payer: Self-pay | Admitting: *Deleted

## 2015-02-04 NOTE — Telephone Encounter (Signed)
Reviewed lab results with patient .given instructions to elevate feet and increase protein in diet

## 2015-02-19 ENCOUNTER — Ambulatory Visit (INDEPENDENT_AMBULATORY_CARE_PROVIDER_SITE_OTHER): Payer: 59 | Admitting: Pharmacist Clinician (PhC)/ Clinical Pharmacy Specialist

## 2015-02-19 DIAGNOSIS — Z7901 Long term (current) use of anticoagulants: Secondary | ICD-10-CM | POA: Diagnosis not present

## 2015-02-19 DIAGNOSIS — I4892 Unspecified atrial flutter: Secondary | ICD-10-CM

## 2015-02-19 LAB — POCT INR: INR: 3.3

## 2015-03-05 ENCOUNTER — Ambulatory Visit (INDEPENDENT_AMBULATORY_CARE_PROVIDER_SITE_OTHER): Payer: 59 | Admitting: Pharmacist Clinician (PhC)/ Clinical Pharmacy Specialist

## 2015-03-05 DIAGNOSIS — I4892 Unspecified atrial flutter: Secondary | ICD-10-CM

## 2015-03-05 DIAGNOSIS — Z7901 Long term (current) use of anticoagulants: Secondary | ICD-10-CM | POA: Diagnosis not present

## 2015-03-05 LAB — POCT INR: INR: 3.4

## 2015-03-15 ENCOUNTER — Other Ambulatory Visit: Payer: Self-pay | Admitting: Internal Medicine

## 2015-03-16 ENCOUNTER — Ambulatory Visit (INDEPENDENT_AMBULATORY_CARE_PROVIDER_SITE_OTHER): Payer: Medicare Other | Admitting: Internal Medicine

## 2015-03-16 ENCOUNTER — Ambulatory Visit (INDEPENDENT_AMBULATORY_CARE_PROVIDER_SITE_OTHER): Payer: 59

## 2015-03-16 ENCOUNTER — Encounter: Payer: Self-pay | Admitting: Internal Medicine

## 2015-03-16 VITALS — BP 132/62 | HR 53 | Ht 69.0 in | Wt 141.1 lb

## 2015-03-16 DIAGNOSIS — Z8679 Personal history of other diseases of the circulatory system: Secondary | ICD-10-CM | POA: Diagnosis not present

## 2015-03-16 DIAGNOSIS — Z9889 Other specified postprocedural states: Secondary | ICD-10-CM

## 2015-03-16 DIAGNOSIS — I4892 Unspecified atrial flutter: Secondary | ICD-10-CM

## 2015-03-16 DIAGNOSIS — I10 Essential (primary) hypertension: Secondary | ICD-10-CM | POA: Diagnosis not present

## 2015-03-16 DIAGNOSIS — Z96651 Presence of right artificial knee joint: Secondary | ICD-10-CM

## 2015-03-16 DIAGNOSIS — Z8774 Personal history of (corrected) congenital malformations of heart and circulatory system: Secondary | ICD-10-CM | POA: Diagnosis not present

## 2015-03-16 DIAGNOSIS — Z7901 Long term (current) use of anticoagulants: Secondary | ICD-10-CM

## 2015-03-16 NOTE — Patient Instructions (Addendum)
HOLD warfarin today (INR = 3.6) Jay Walters will call you with instructions later today.  Your physician wants you to follow-up in: 6 months with Dr. Debara Pickett. You will receive a reminder letter in the mail two months in advance. If you don't receive a letter, please call our office to schedule the follow-up appointment.  [name], MD for knee pain ([place])

## 2015-03-16 NOTE — Progress Notes (Signed)
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 He was previously followed by Jay Walters. At that time, he was seen and was in atrial flutter and felt to be potentially a candidate for A-flutter ablation due to his bradycardia; however, the timing was such that it was recommended that this would be postponed until after he has recovered. He also has a history of a remote ASD repair and was on Coumadin in the past but has not been on that recently. He was restarted on Coumadin after there was time to allow for recovery from the stroke. He did have a CT angiogram of the neck which showed question of thrombus or atherosclerotic plaque at the carotid bifurcation and an ultrasound of the neck was repeated in our office on June 26, 2011, which demonstrated no significant carotid stenosis.  Jay Walters returns today for follow-up. He's had some back injections for pain but he reports only last for about 1 day. He's also had problems with his right knee. This was replaced over year ago at [place] and he reports instability and pain in that knee. He's had falls recently including a fall that caused a laceration above his left eyebrow which was sutured. His head was scanned and fortunately there was no intracranial bleeding on warfarin. In October he was in the hospital for what was thought to be a syncopal event however he feels that he fell due to tripping on the carpet. He denies any loss of consciousness. Heart rate was low in the 40s and he's had problems with bradycardia in the past. There is been some medication adjustments and he's felt pretty well since then. He was supposed to get a follow-up monitor but that was never arranged.  I saw Jay Walters in the office today for follow-up.  Overall he feels well except he continues to have problems with the right knee. I sent him for a second opinion with Jay Walters at [facility] orthopedics. He felt that he could do some cleaning up of the knee but could not "guarantee" Jay Walters that he be free of pain. Based on this, Jay Walters didn't think that additional surgery was a good idea. His INR today was 3.6 in the office and he was advised to hold his dose tonight and additional dose adjustments of warfarin will be made. He denies any recurrent atrial flutter. Heart rate today is stable in the 50s with a first-degree AV block, not uncommon after ablation. Blood pressure is at goal.  PMHx:  Past Medical History  Diagnosis Date  . Dysphagia     with solids  . Esophageal stricture   . Diverticulosis   . Osteopenia   . Hemorrhoids   . High cholesterol   . Heart murmur   . H/O hiatal hernia   . Hypertension   . Atrial flutter     typical appearing by ekg. s/p ablation 07/28/11  . Atrial fibrillation     remotely   . Sinus bradycardia     asymptomatic  . Stroke 2012    "left me weaker on left side; balance is not good"  . GERD (gastroesophageal reflux disease)   . Seizures     "back w/migraine headaches; 1990's or before"  . Arthritis     "right hip; right  knee; lower back ~ 1/2 way across"  . Anemia   . SSS (sick sinus syndrome)     Past Surgical History  Procedure Laterality Date  . Asd repair  [id number]    Patch repair  . Knee arthroscopy      right; "maybe twice"  . Inguinal hernia repair  2011    right  . Cardiac electrophysiology study and ablation  07/28/11  . Total knee arthroplasty  07/10/2012    Procedure: TOTAL KNEE ARTHROPLASTY;  Surgeon: Jay Walters;  Location: [facility];  Service: Orthopedics;  Laterality: Right;  . Joint replacement      right knee replacement  . Carotid doppler  06/26/2011    Bilateral ICAs-demonstrated normal patency without eivdence of significant diameter reduction, dissection, or  any other vascular abnormality.  . Cardiovascular stress test  03/07/2010    Perfusion defect in the inferior myocardial region is consistent with diaphragmatic attenuation. No ischemia or infarct/scar is seen in the remaining myocardium. No ECG changes.  . Transthoracic echocardiogram  12/10/2012    EF 55-60%. No regional wall motion abnormalities. LA moderately dialted.Mild-moderate regurg of the tricuspid valve.  . Atrial flutter ablation N/A 07/28/2011    Procedure: ATRIAL FLUTTER ABLATION;  Surgeon: Jay Walters;  Location: [facility] CATH LAB;  Service: Cardiovascular;  Laterality: N/A;    FAMHx:  Family History  Problem Relation Age of Onset  . Heart disease Father   . Cancer Sister     unsure what type    SOCHx:   reports that he has never smoked. He has never used smokeless tobacco. He reports that he drinks alcohol. He reports that he does not use illicit drugs.  ALLERGIES:  No Known Allergies  ROS: A comprehensive review of systems was negative except for: Musculoskeletal: positive for back pain and knee pain Neurological: positive for coordination problems and dizziness  HOME MEDS: Current Outpatient Prescriptions  Medication Sig Dispense Refill  . amitriptyline (ELAVIL) 10 MG tablet Take 1 tablet (10 mg total) by mouth at bedtime. 30 tablet 5  . atorvastatin (LIPITOR) 20 MG tablet Take 1 tablet by mouth  daily 90 tablet 1  . bimatoprost (LUMIGAN) 0.03 % ophthalmic solution Place 1 drop into both eyes at bedtime. For 3 weeks, then return for a check up. Starting on 03/13/2014.    Marland Kitchen doxazosin (CARDURA) 4 MG tablet Take 1 tablet (4 mg total) by mouth at bedtime. 90 tablet 2  . ferrous sulfate 325 (65 FE) MG tablet Take 325 mg by mouth 2 (two) times daily after a meal.     . finasteride (PROSCAR) 5 MG tablet Take 1 tablet (5 mg total) by mouth daily. 30 tablet 11  . gabapentin (NEURONTIN) 300 MG capsule Take 300 mg by mouth 2 (two) times daily.    Marland Kitchen levothyroxine (SYNTHROID,  LEVOTHROID) 50 MCG tablet Take 1 tablet (50 mcg total) by mouth daily. 90 tablet 0  . mirabegron ER (MYRBETRIQ) 25 MG TB24 tablet Take 25 mg by mouth daily.    . Multiple Vitamin (MULTIVITAMIN WITH MINERALS) TABS tablet Take 1 tablet by mouth daily.    . naproxen sodium (ALEVE) 220 MG tablet Take 220 mg by mouth daily as needed (arthritis pain).    . phenytoin (DILANTIN) 100 MG ER capsule Take 3 capsules by mouth  every day 270 capsule 1  . Polyethyl Glycol-Propyl Glycol (SYSTANE ULTRA) 0.4-0.3 % SOLN Place 1 drop into both eyes daily.    . tamsulosin (FLOMAX) 0.4  MG CAPS capsule     . TRAVATAN Z 0.004 % SOLN ophthalmic solution Place 1 drop into both eyes daily.    Marland Kitchen warfarin (COUMADIN) 5 MG tablet TAKE 1 TABLET BY MOUTH  DAILY OR AS DIRECTED BY  COUMADIN CLINIC 90 tablet 1   No current facility-administered medications for this visit.    LABS/IMAGING: No results found for this or any previous visit (from the past 48 hour(s)). No results found.  VITALS: BP 132/62 mmHg  Pulse 53  Ht '5\' 9"'$  (1.753 m)  Wt 141 lb 1.6 oz (64.003 kg)  BMI 20.83 kg/m2  EXAM: General appearance: alert and no distress Neck: no carotid bruit, no JVD and thyroid not enlarged, symmetric, no tenderness/mass/nodules Lungs: clear to auscultation bilaterally Heart: regular rate and rhythm, S1, S2 normal, no murmur, click, rub or gallop Abdomen: soft, non-tender; bowel sounds normal; no masses,  no organomegaly Extremities: extremities normal, atraumatic, no cyanosis or edema Pulses: 2+ and symmetric Skin: Skin color, texture, turgor normal. No rashes or lesions Neurologic: Mental status: Alert, oriented, thought content appropriate PSych: Normal  EKG: Sinus bradycardia with first-degree AV block at 53  ASSESSMENT: 1. Paroxysmal atrial fibrillation status post ablation, maintaining sinus - CHADSVASC score 5 2. Hypertension 3. Prior stroke with aphasia and balance problems 4. Ongoing low back  pain 5. Chronic anticoagulation on warfarin 6. History of atrial septal defect status post patch repair  PLAN: 1.   Mr. Pustejovsky is doing well from a cardiac standpoint. His main complaints have to do with knee pain. Again, he was referred and saw Jay Walters who could not guarantee him freedom from knee pain with reoperation. He continues to do well with that warfarin anticoagulation although the INR was slightly high today. We'll make adjustments on that. He's had no recurrence of atrial flutter. Overall he seems to be doing fairly well. Blood pressure is controlled. Plan to see him back in 6 months or sooner as necessary.  Pixie Casino, Walters, [facility] Attending Cardiologist [place] 03/16/2015, 10:04 AM

## 2015-03-24 ENCOUNTER — Telehealth: Payer: Self-pay | Admitting: Internal Medicine

## 2015-03-24 NOTE — Telephone Encounter (Signed)
Patient states that he saw Dr. [name] (Ortho) regarding his knee.  Said when he was last in to see you that he couldn't remember who he saw and was to get back to you.  He states that you wanted to speak with the dr regarding his knee pain and possible repeat surgery.  His phone # [phone].  Thanks.

## 2015-03-25 NOTE — Telephone Encounter (Signed)
I had spoken with him regarding possibility of going to [facility] or [place] for another opinion. Does he want to go??

## 2015-04-16 ENCOUNTER — Ambulatory Visit (INDEPENDENT_AMBULATORY_CARE_PROVIDER_SITE_OTHER): Payer: 59 | Admitting: Pharmacist Clinician (PhC)/ Clinical Pharmacy Specialist

## 2015-04-16 DIAGNOSIS — Z7901 Long term (current) use of anticoagulants: Secondary | ICD-10-CM | POA: Diagnosis not present

## 2015-04-16 DIAGNOSIS — I4892 Unspecified atrial flutter: Secondary | ICD-10-CM

## 2015-04-16 DIAGNOSIS — I483 Typical atrial flutter: Secondary | ICD-10-CM | POA: Diagnosis not present

## 2015-04-16 LAB — POCT INR: INR: 3.8

## 2015-04-30 ENCOUNTER — Ambulatory Visit (INDEPENDENT_AMBULATORY_CARE_PROVIDER_SITE_OTHER): Payer: 59 | Admitting: Pharmacist Clinician (PhC)/ Clinical Pharmacy Specialist

## 2015-04-30 DIAGNOSIS — Z7901 Long term (current) use of anticoagulants: Secondary | ICD-10-CM

## 2015-04-30 DIAGNOSIS — I483 Typical atrial flutter: Secondary | ICD-10-CM

## 2015-04-30 DIAGNOSIS — I4892 Unspecified atrial flutter: Secondary | ICD-10-CM

## 2015-04-30 LAB — POCT INR: INR: 3.7

## 2015-05-14 ENCOUNTER — Telehealth: Payer: Self-pay | Admitting: Internal Medicine

## 2015-05-14 ENCOUNTER — Ambulatory Visit (INDEPENDENT_AMBULATORY_CARE_PROVIDER_SITE_OTHER): Payer: 59 | Admitting: Pharmacist Clinician (PhC)/ Clinical Pharmacy Specialist

## 2015-05-14 DIAGNOSIS — I483 Typical atrial flutter: Secondary | ICD-10-CM | POA: Diagnosis not present

## 2015-05-14 DIAGNOSIS — I4892 Unspecified atrial flutter: Secondary | ICD-10-CM

## 2015-05-14 DIAGNOSIS — Z7901 Long term (current) use of anticoagulants: Secondary | ICD-10-CM | POA: Diagnosis not present

## 2015-05-14 LAB — POCT INR: INR: 5.4

## 2015-05-17 NOTE — Telephone Encounter (Signed)
#  30 w/11 refills sent to mail order. Patient is currently out and needs a 30 day supply sent to local pharmacy to carry her through until her mail order comes.

## 2015-05-17 NOTE — Telephone Encounter (Signed)
Thought I did this please check

## 2015-05-17 NOTE — Telephone Encounter (Signed)
Spouse called and states that [facility] mailorder prescription could take up to 3 weeks and she is requesting a local refill for 30 days phenytoin (DILANTIN) 100 MG ER cap to [place] number to call is [phone]

## 2015-05-21 ENCOUNTER — Ambulatory Visit (INDEPENDENT_AMBULATORY_CARE_PROVIDER_SITE_OTHER): Payer: 59 | Admitting: Pharmacist Clinician (PhC)/ Clinical Pharmacy Specialist

## 2015-05-21 DIAGNOSIS — Z7901 Long term (current) use of anticoagulants: Secondary | ICD-10-CM

## 2015-05-21 DIAGNOSIS — I4892 Unspecified atrial flutter: Secondary | ICD-10-CM

## 2015-05-21 DIAGNOSIS — I483 Typical atrial flutter: Secondary | ICD-10-CM | POA: Diagnosis not present

## 2015-05-21 LAB — POCT INR: INR: 1.5

## 2015-06-04 ENCOUNTER — Encounter: Payer: 59 | Admitting: Pharmacist Clinician (PhC)/ Clinical Pharmacy Specialist

## 2015-06-13 ENCOUNTER — Other Ambulatory Visit: Payer: Self-pay | Admitting: Internal Medicine

## 2015-06-18 ENCOUNTER — Ambulatory Visit (INDEPENDENT_AMBULATORY_CARE_PROVIDER_SITE_OTHER): Payer: 59 | Admitting: Pharmacist Clinician (PhC)/ Clinical Pharmacy Specialist

## 2015-06-18 DIAGNOSIS — Z7901 Long term (current) use of anticoagulants: Secondary | ICD-10-CM | POA: Diagnosis not present

## 2015-06-18 DIAGNOSIS — I4892 Unspecified atrial flutter: Secondary | ICD-10-CM

## 2015-06-18 DIAGNOSIS — I483 Typical atrial flutter: Secondary | ICD-10-CM | POA: Diagnosis not present

## 2015-06-18 LAB — POCT INR: INR: 3.9

## 2015-06-20 ENCOUNTER — Other Ambulatory Visit: Payer: Self-pay | Admitting: Internal Medicine

## 2015-06-21 NOTE — Telephone Encounter (Signed)
Rx(s) sent to pharmacy electronically.  

## 2015-06-29 ENCOUNTER — Other Ambulatory Visit: Payer: Self-pay | Admitting: Internal Medicine

## 2015-06-29 ENCOUNTER — Other Ambulatory Visit: Payer: Self-pay | Admitting: Pharmacist Clinician (PhC)/ Clinical Pharmacy Specialist

## 2015-06-29 MED ORDER — WARFARIN SODIUM 5 MG PO TABS: ORAL_TABLET | ORAL | Status: DC

## 2015-06-29 MED ORDER — DOXAZOSIN MESYLATE 4 MG PO TABS: 4.0000 mg | ORAL_TABLET | Freq: Every day | ORAL | Status: DC

## 2015-07-01 ENCOUNTER — Encounter: Payer: Self-pay | Admitting: Internal Medicine

## 2015-07-01 ENCOUNTER — Ambulatory Visit (INDEPENDENT_AMBULATORY_CARE_PROVIDER_SITE_OTHER): Payer: 59 | Admitting: Internal Medicine

## 2015-07-01 VITALS — BP 150/62 | HR 50 | Temp 98.1°F | Resp 20 | Wt 146.0 lb

## 2015-07-01 DIAGNOSIS — R609 Edema, unspecified: Secondary | ICD-10-CM | POA: Diagnosis not present

## 2015-07-01 LAB — BASIC METABOLIC PANEL
BUN: 14 mg/dL (ref 7–25)
CALCIUM: 9 mg/dL (ref 8.6–10.3)
CO2: 26 mmol/L (ref 20–31)
Chloride: 105 mmol/L (ref 98–110)
Creat: 0.67 mg/dL — ABNORMAL LOW (ref 0.70–1.11)
GLUCOSE: 115 mg/dL — AB (ref 65–99)
Potassium: 3.9 mmol/L (ref 3.5–5.3)
SODIUM: 138 mmol/L (ref 135–146)

## 2015-07-01 MED ORDER — TRAMADOL HCL 50 MG PO TABS: 50.0000 mg | ORAL_TABLET | Freq: Three times a day (TID) | ORAL | Status: DC | PRN

## 2015-07-01 MED ORDER — FUROSEMIDE 20 MG PO TABS: 20.0000 mg | ORAL_TABLET | Freq: Every day | ORAL | Status: DC

## 2015-07-01 NOTE — Patient Instructions (Addendum)
Start lasix 20 mg daily and RTC in 2 weeks. Lab work pending.

## 2015-07-01 NOTE — Progress Notes (Signed)
   Subjective:    Patient ID: Jay Walters, male    DOB: Oct 22, 1931, 82 y.o.   MRN: [id number]  HPI  Patient says he's not been doing a lot of walking about the house because of fear of falling. He is sometimes at home alone while his son and wife are working. Says he hasn't fallen in about a year but realizes his gait is not stable and ambulates with a walker. Sitting a lot with his feet down. Says he doesn't salt his food a lot but sometimes. Has developed dependent edema over the past month or so. No shortness of breath. Pulse oximetry is normal. He is not on a diuretic. He does take over-the-counterNSAIDS daily for musculoskeletal pain. That could cause a bit of fluid retention but has not done so previously. Denies chest pain. He remains on chronic anticoagulation therapy status post ASD repair many years ago. Followed by cardiology. He was seen here for edema in August and weight was 144 pounds. It is now 146 pounds. In August, prealbumin and BNP were normal. He did not have extreme edema. He has now had to get a larger shoe size because of the edema.   Review of Systems     Objective:   Physical Exam  Alert and oriented. Skin warm and dry. Nodes none. Chest clear to auscultation without rales or wheezing. No JVD. Pulses regular. Cardiac exam regular rate and rhythm. Extremities significant pitting edema of ankles and feet 2+      Assessment & Plan:   Dependent edema  History of seizure disorder  Chronic anticoagulation with history of ASD repair  History of atrial flutter status post ablation  History of right brain stroke 2013  History of right knee replacement  BPH-treated by Dr. Pete Pelt  Essential hypertension  Plan: Regard to try low-dose Lasix 20 mg daily and have him return in 2 weeks. He will need a be met at that time. We are checking once again a BNP today. I do not think he is in heart failure. He is to keep his feet elevated when he is sitting around the  house. Watch salt intake. May need to wear compression hose.

## 2015-07-02 LAB — TSH: TSH: 4.781 u[IU]/mL — AB (ref 0.350–4.500)

## 2015-07-02 LAB — BRAIN NATRIURETIC PEPTIDE: Brain Natriuretic Peptide: 82.6 pg/mL (ref 0.0–100.0)

## 2015-07-08 ENCOUNTER — Telehealth: Payer: Self-pay | Admitting: Internal Medicine

## 2015-07-08 MED ORDER — LEVOTHYROXINE SODIUM 50 MCG PO TABS: 50.0000 ug | ORAL_TABLET | Freq: Every day | ORAL | Status: DC

## 2015-07-08 NOTE — Telephone Encounter (Signed)
Please send Levothyroxine medication to [facility].

## 2015-07-09 ENCOUNTER — Ambulatory Visit (INDEPENDENT_AMBULATORY_CARE_PROVIDER_SITE_OTHER): Payer: 59 | Admitting: Pharmacist Clinician (PhC)/ Clinical Pharmacy Specialist

## 2015-07-09 DIAGNOSIS — I4892 Unspecified atrial flutter: Secondary | ICD-10-CM

## 2015-07-09 DIAGNOSIS — Z7901 Long term (current) use of anticoagulants: Secondary | ICD-10-CM

## 2015-07-09 DIAGNOSIS — I483 Typical atrial flutter: Secondary | ICD-10-CM

## 2015-07-09 LAB — POCT INR: INR: 1.3

## 2015-07-15 ENCOUNTER — Ambulatory Visit: Payer: 59 | Admitting: Internal Medicine

## 2015-07-16 ENCOUNTER — Ambulatory Visit: Payer: 59 | Admitting: Internal Medicine

## 2015-07-23 ENCOUNTER — Ambulatory Visit (INDEPENDENT_AMBULATORY_CARE_PROVIDER_SITE_OTHER): Payer: 59 | Admitting: Pharmacist Clinician (PhC)/ Clinical Pharmacy Specialist

## 2015-07-23 DIAGNOSIS — I4892 Unspecified atrial flutter: Secondary | ICD-10-CM | POA: Diagnosis not present

## 2015-07-23 DIAGNOSIS — I483 Typical atrial flutter: Secondary | ICD-10-CM

## 2015-07-23 DIAGNOSIS — Z7901 Long term (current) use of anticoagulants: Secondary | ICD-10-CM | POA: Diagnosis not present

## 2015-07-23 LAB — POCT INR: INR: 2

## 2015-08-09 ENCOUNTER — Other Ambulatory Visit: Payer: Self-pay

## 2015-08-09 MED ORDER — PHENYTOIN SODIUM EXTENDED 100 MG PO CAPS: ORAL_CAPSULE | ORAL | Status: DC

## 2015-08-10 ENCOUNTER — Ambulatory Visit (INDEPENDENT_AMBULATORY_CARE_PROVIDER_SITE_OTHER): Payer: 59 | Admitting: Pharmacist Clinician (PhC)/ Clinical Pharmacy Specialist

## 2015-08-10 DIAGNOSIS — Z7901 Long term (current) use of anticoagulants: Secondary | ICD-10-CM

## 2015-08-10 DIAGNOSIS — I483 Typical atrial flutter: Secondary | ICD-10-CM | POA: Diagnosis not present

## 2015-08-10 LAB — POCT INR: INR: 2

## 2015-09-10 ENCOUNTER — Encounter: Payer: 59 | Admitting: Pharmacist Clinician (PhC)/ Clinical Pharmacy Specialist

## 2015-09-16 ENCOUNTER — Encounter: Payer: 59 | Admitting: Pharmacist Clinician (PhC)/ Clinical Pharmacy Specialist

## 2015-09-20 ENCOUNTER — Encounter: Payer: 59 | Admitting: Pharmacist Clinician (PhC)/ Clinical Pharmacy Specialist

## 2015-09-24 ENCOUNTER — Ambulatory Visit (INDEPENDENT_AMBULATORY_CARE_PROVIDER_SITE_OTHER): Payer: 59 | Admitting: Pharmacist Clinician (PhC)/ Clinical Pharmacy Specialist

## 2015-09-24 DIAGNOSIS — Z7901 Long term (current) use of anticoagulants: Secondary | ICD-10-CM | POA: Diagnosis not present

## 2015-09-24 DIAGNOSIS — I483 Typical atrial flutter: Secondary | ICD-10-CM | POA: Diagnosis not present

## 2015-09-24 DIAGNOSIS — I4892 Unspecified atrial flutter: Secondary | ICD-10-CM | POA: Diagnosis not present

## 2015-09-24 LAB — POCT INR: INR: 1.5

## 2015-09-29 DIAGNOSIS — M25561 Pain in right knee: Secondary | ICD-10-CM | POA: Diagnosis not present

## 2015-09-29 DIAGNOSIS — Z96651 Presence of right artificial knee joint: Secondary | ICD-10-CM | POA: Diagnosis not present

## 2015-10-08 ENCOUNTER — Ambulatory Visit (INDEPENDENT_AMBULATORY_CARE_PROVIDER_SITE_OTHER): Payer: 59 | Admitting: Pharmacist

## 2015-10-08 DIAGNOSIS — Z7901 Long term (current) use of anticoagulants: Secondary | ICD-10-CM

## 2015-10-08 DIAGNOSIS — I4892 Unspecified atrial flutter: Secondary | ICD-10-CM

## 2015-10-08 DIAGNOSIS — I483 Typical atrial flutter: Secondary | ICD-10-CM | POA: Diagnosis not present

## 2015-10-08 LAB — POCT INR: INR: 2.2

## 2015-10-09 IMAGING — CT CT HEAD W/O CM
2 series · 16 of 30 positions shown, 20 images · non-contrast
Comparison: 07/28/2012

CLINICAL DATA: Syncope and Coumadin use.  Initial encounter

EXAM:
CT HEAD WITHOUT CONTRAST
TECHNIQUE: Contiguous axial images were obtained from the base of the skull
through the vertex without intravenous contrast.

[Series 201: head w/o, idose (1) · axial · non-contrast · 0.49mm/px · z∈[+89,+219]mm · 13 of 32 slices shown, 17 images]
[im 3/32  brain]
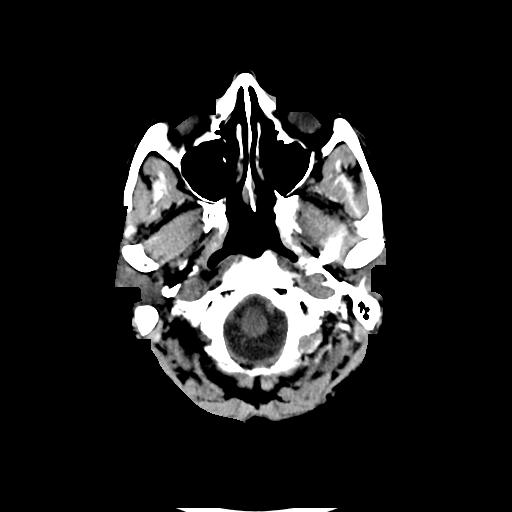
[im 3/32  bone]
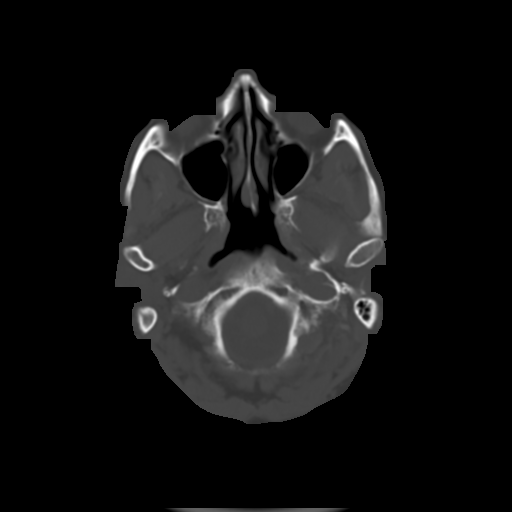
[im 5/32  brain]
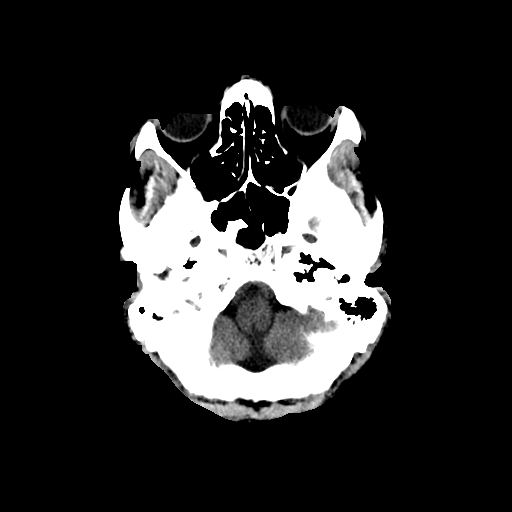
[im 7/32  brain]
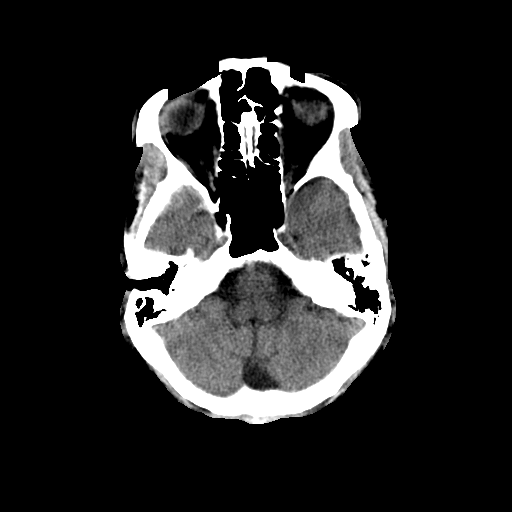
[im 9/32  brain]
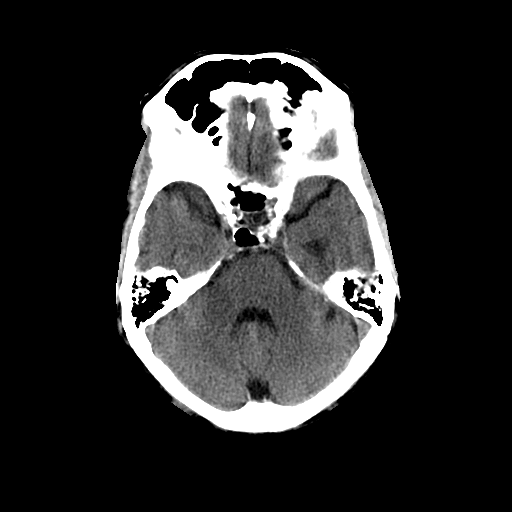
[im 12/32  brain]
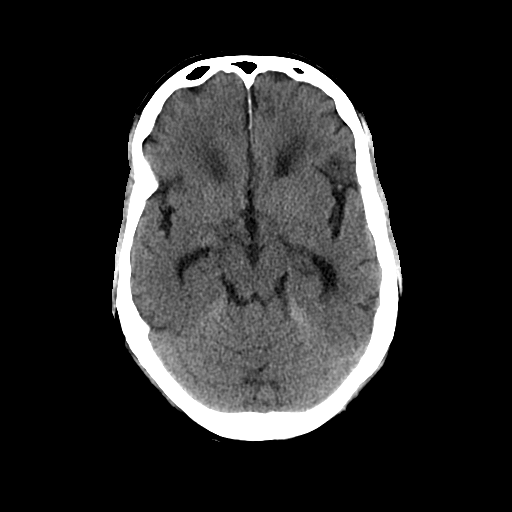
[im 12/32  bone]
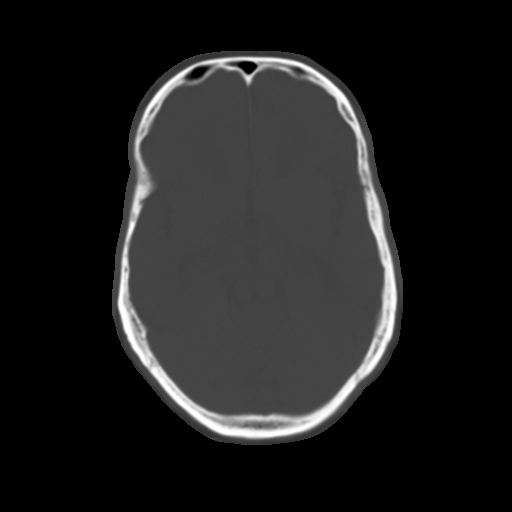
[im 14/32  brain]
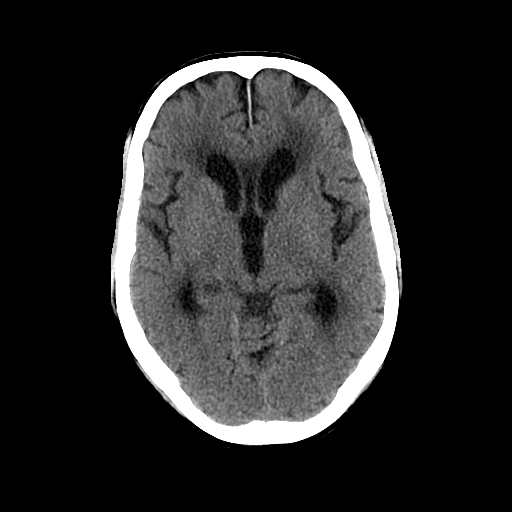
[im 16/32  brain]
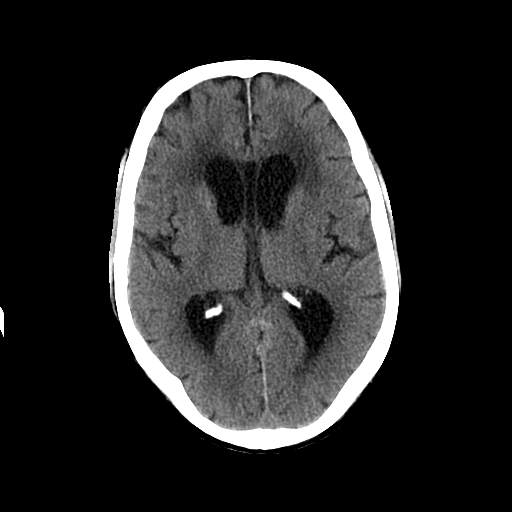
[im 18/32  brain]
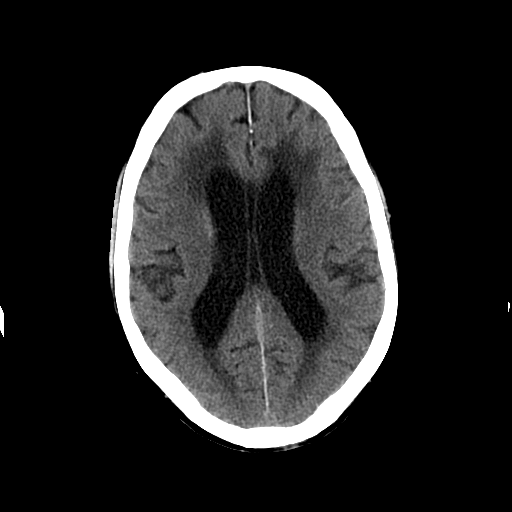
[im 20/32  brain]
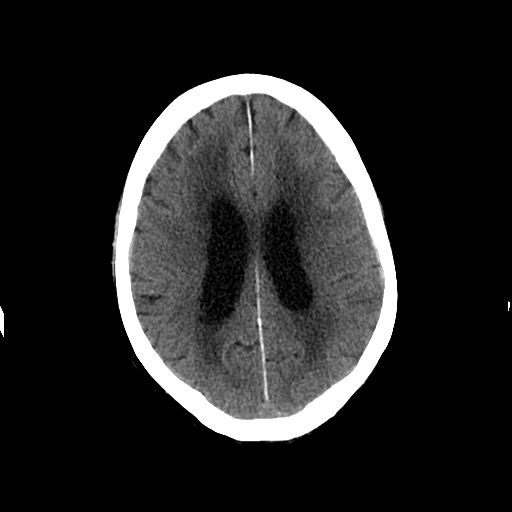
[im 20/32  bone]
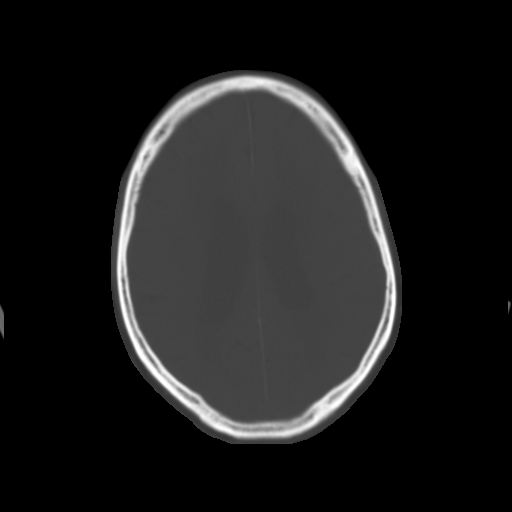
[im 23/32  brain]
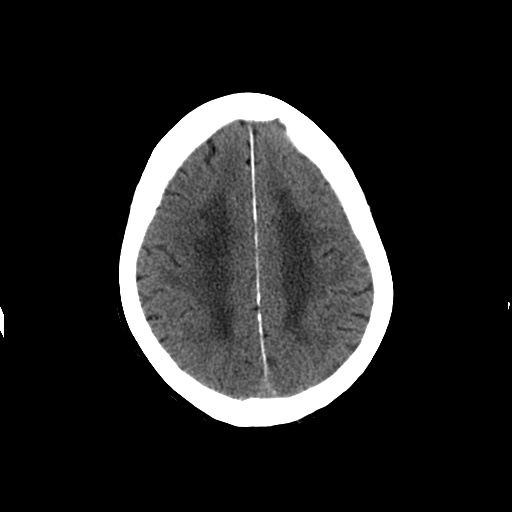
[im 25/32  brain]
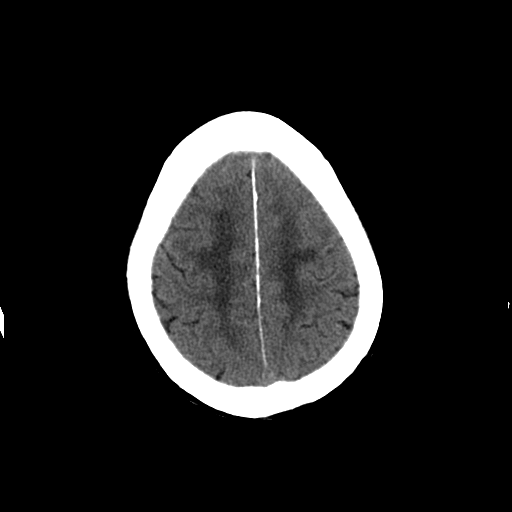
[im 27/32  brain]
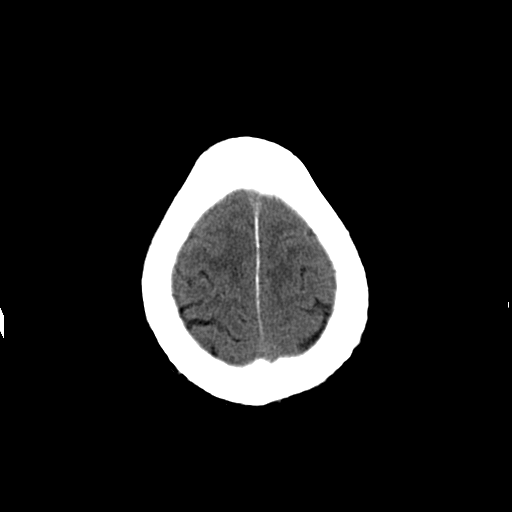
[im 29/32  brain]
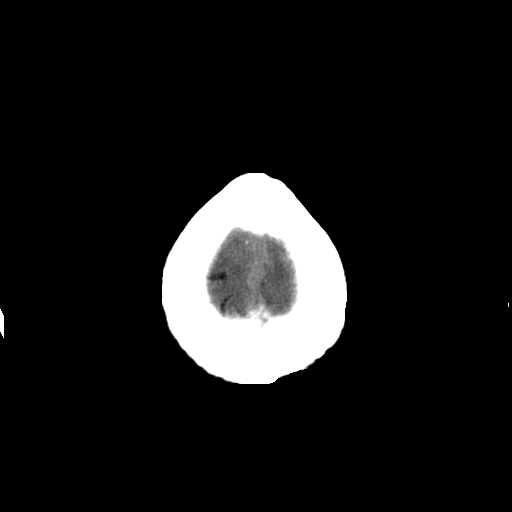
[im 29/32  bone]
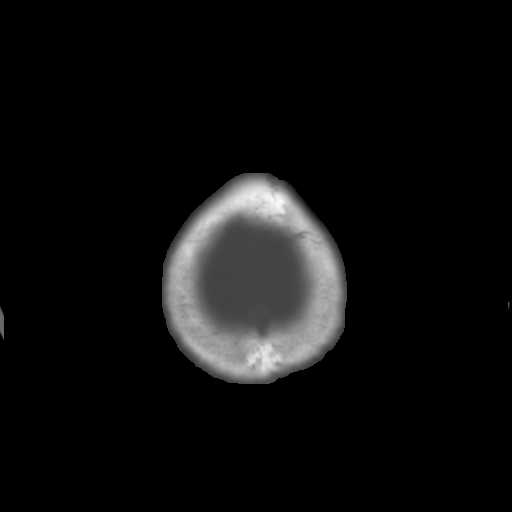

[Series 202: head w/o bone, idose (1) · axial · non-contrast · 0.49mm/px · z∈[+89,+134]mm · 3 of 32 slices shown]
[im 3/32  bone]
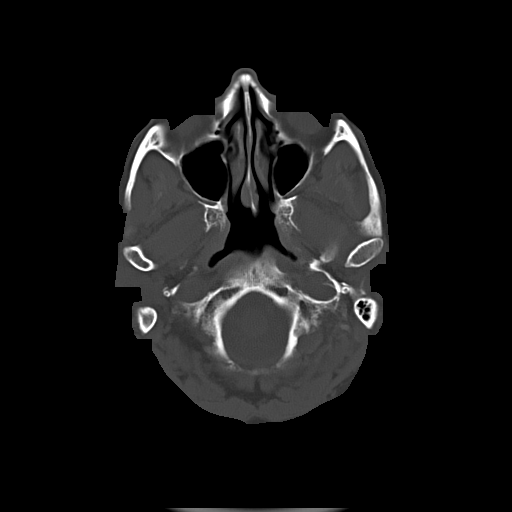
[im 7/32  bone]
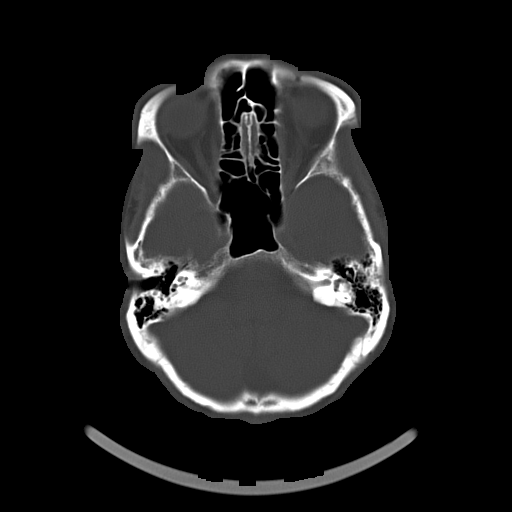
[im 12/32  bone]
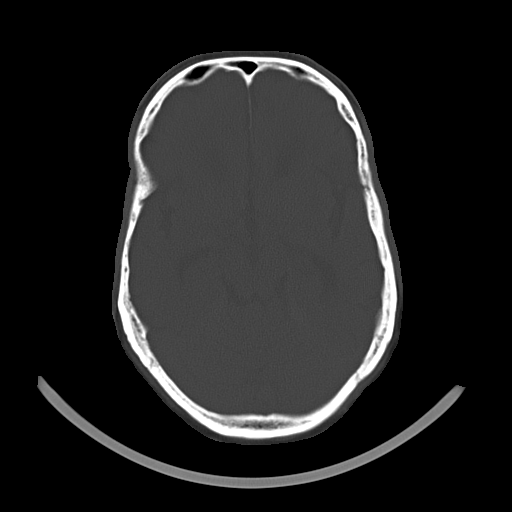

[16 of 30 positions shown; findings below may reference images not displayed]

FINDINGS: Skull and Sinuses:Negative for fracture or destructive process. The
mastoids, middle ears, and imaged paranasal sinuses are clear.

Orbits: No acute abnormality.

Brain: No evidence of acute abnormality, such as acute infarction,
hemorrhage, acute hydrocephalus, or mass lesion/mass effect.

Stable ventriculomegaly which is prominent relative to sulcal
enlargement at the vertex, but stable over multiple years.
Confluence bilateral cerebral white matter low density is stable
compared to 4933 and consistent with moderate chronic small vessel
disease.
IMPRESSION: 1. No acute intracranial findings.
2. Chronic changes are stable from 4933 and noted above.

## 2015-10-29 ENCOUNTER — Ambulatory Visit (INDEPENDENT_AMBULATORY_CARE_PROVIDER_SITE_OTHER): Payer: 59 | Admitting: Pharmacist

## 2015-10-29 DIAGNOSIS — I4892 Unspecified atrial flutter: Secondary | ICD-10-CM | POA: Diagnosis not present

## 2015-10-29 DIAGNOSIS — Z7901 Long term (current) use of anticoagulants: Secondary | ICD-10-CM

## 2015-10-29 DIAGNOSIS — Z471 Aftercare following joint replacement surgery: Secondary | ICD-10-CM | POA: Diagnosis not present

## 2015-10-29 DIAGNOSIS — I483 Typical atrial flutter: Secondary | ICD-10-CM | POA: Diagnosis not present

## 2015-10-29 DIAGNOSIS — Z96651 Presence of right artificial knee joint: Secondary | ICD-10-CM | POA: Diagnosis not present

## 2015-10-29 LAB — POCT INR: INR: 1.8

## 2015-11-12 DIAGNOSIS — Z471 Aftercare following joint replacement surgery: Secondary | ICD-10-CM | POA: Diagnosis not present

## 2015-11-12 DIAGNOSIS — Z96651 Presence of right artificial knee joint: Secondary | ICD-10-CM | POA: Diagnosis not present

## 2015-11-29 ENCOUNTER — Ambulatory Visit (INDEPENDENT_AMBULATORY_CARE_PROVIDER_SITE_OTHER): Payer: 59 | Admitting: Pharmacist

## 2015-11-29 DIAGNOSIS — Z7901 Long term (current) use of anticoagulants: Secondary | ICD-10-CM

## 2015-11-29 DIAGNOSIS — I4892 Unspecified atrial flutter: Secondary | ICD-10-CM | POA: Diagnosis not present

## 2015-11-29 DIAGNOSIS — I483 Typical atrial flutter: Secondary | ICD-10-CM

## 2015-11-29 LAB — POCT INR: INR: 2.1

## 2015-12-10 ENCOUNTER — Other Ambulatory Visit: Payer: Self-pay | Admitting: Internal Medicine

## 2015-12-21 DIAGNOSIS — H4321 Crystalline deposits in vitreous body, right eye: Secondary | ICD-10-CM | POA: Diagnosis not present

## 2015-12-21 DIAGNOSIS — H43812 Vitreous degeneration, left eye: Secondary | ICD-10-CM | POA: Diagnosis not present

## 2015-12-21 DIAGNOSIS — H401132 Primary open-angle glaucoma, bilateral, moderate stage: Secondary | ICD-10-CM | POA: Diagnosis not present

## 2015-12-21 DIAGNOSIS — H25813 Combined forms of age-related cataract, bilateral: Secondary | ICD-10-CM | POA: Diagnosis not present

## 2015-12-24 ENCOUNTER — Ambulatory Visit (INDEPENDENT_AMBULATORY_CARE_PROVIDER_SITE_OTHER): Payer: 59 | Admitting: Pharmacist Clinician (PhC)/ Clinical Pharmacy Specialist

## 2015-12-24 DIAGNOSIS — I4892 Unspecified atrial flutter: Secondary | ICD-10-CM | POA: Diagnosis not present

## 2015-12-24 DIAGNOSIS — I483 Typical atrial flutter: Secondary | ICD-10-CM | POA: Diagnosis not present

## 2015-12-24 DIAGNOSIS — Z7901 Long term (current) use of anticoagulants: Secondary | ICD-10-CM

## 2015-12-24 LAB — POCT INR: INR: 2.4

## 2016-01-13 DIAGNOSIS — H2511 Age-related nuclear cataract, right eye: Secondary | ICD-10-CM | POA: Diagnosis not present

## 2016-01-14 ENCOUNTER — Encounter: Payer: Self-pay | Admitting: Internal Medicine

## 2016-01-14 ENCOUNTER — Ambulatory Visit (INDEPENDENT_AMBULATORY_CARE_PROVIDER_SITE_OTHER): Payer: 59 | Admitting: Internal Medicine

## 2016-01-14 VITALS — BP 140/62 | HR 52 | Wt 140.0 lb

## 2016-01-14 DIAGNOSIS — R609 Edema, unspecified: Secondary | ICD-10-CM | POA: Diagnosis not present

## 2016-01-14 MED ORDER — FUROSEMIDE 40 MG PO TABS: 40.0000 mg | ORAL_TABLET | Freq: Every day | ORAL | 3 refills | Status: DC

## 2016-01-14 NOTE — Progress Notes (Signed)
   Subjective:    Patient ID: Jay Walters, male    DOB: 10-21-1931, 82 y.o.   MRN: [id number]  HPI Just had right cataract surgery yesterday by Dr. Katy Fitch. To have other eye done in 4 weeks. Recurrent dependent edema. Apparently has run out of Lasix. Had a fall recently but no serious injuries.  Is supossed to be on Lasix 20 mg daily for dependent edema. Not as active and sits with feet down. Edema improves overnight but has difficulty wearing shoes due to edema.    Review of Systems see above     Objective:   Physical Exam  Constitutional: He appears well-developed and well-nourished. No distress.  Neck: No thyromegaly present.  Cardiovascular: Normal rate and regular rhythm.   Pulmonary/Chest: No respiratory distress. He has no wheezes. He has no rales.  Musculoskeletal: He exhibits edema.  2 plus pitting edema ankles.  Skin: Skin is warm and dry.  Vitals reviewed.         Assessment & Plan:  Dependent edema  Plan: Increase Lasix to 40 mg daily from 20 mg daily and follow-up in a couple of weeks.

## 2016-01-28 ENCOUNTER — Encounter: Payer: Self-pay | Admitting: Internal Medicine

## 2016-01-28 ENCOUNTER — Ambulatory Visit (INDEPENDENT_AMBULATORY_CARE_PROVIDER_SITE_OTHER): Payer: 59 | Admitting: Internal Medicine

## 2016-01-28 VITALS — BP 126/68 | HR 53 | Temp 97.3°F | Ht 69.0 in | Wt 136.5 lb

## 2016-01-28 DIAGNOSIS — M5441 Lumbago with sciatica, right side: Secondary | ICD-10-CM | POA: Diagnosis not present

## 2016-01-28 DIAGNOSIS — Z7901 Long term (current) use of anticoagulants: Secondary | ICD-10-CM | POA: Diagnosis not present

## 2016-01-28 DIAGNOSIS — R609 Edema, unspecified: Secondary | ICD-10-CM

## 2016-01-28 LAB — BASIC METABOLIC PANEL
BUN: 16 mg/dL (ref 7–25)
CALCIUM: 8.6 mg/dL (ref 8.6–10.3)
CO2: 28 mmol/L (ref 20–31)
Chloride: 104 mmol/L (ref 98–110)
Creat: 0.86 mg/dL (ref 0.70–1.11)
GLUCOSE: 81 mg/dL (ref 65–99)
POTASSIUM: 3.9 mmol/L (ref 3.5–5.3)
SODIUM: 139 mmol/L (ref 135–146)

## 2016-01-28 MED ORDER — TRAMADOL HCL 50 MG PO TABS: 50.0000 mg | ORAL_TABLET | Freq: Three times a day (TID) | ORAL | 0 refills | Status: DC | PRN

## 2016-01-28 NOTE — Patient Instructions (Addendum)
Tramadol daily if needed for back pain. Rx for TEDS hose for edema.

## 2016-01-31 ENCOUNTER — Other Ambulatory Visit: Payer: Self-pay | Admitting: Internal Medicine

## 2016-01-31 NOTE — Telephone Encounter (Signed)
Rx request sent to pharmacy.  

## 2016-02-02 DIAGNOSIS — H2512 Age-related nuclear cataract, left eye: Secondary | ICD-10-CM | POA: Diagnosis not present

## 2016-02-09 NOTE — Patient Instructions (Addendum)
Increase Lasix from 20-40 mg daily and follow-up in a couple of weeks. Try to keep legs elevated as much as possible

## 2016-02-09 NOTE — Progress Notes (Signed)
   Subjective:    Patient ID: Jay Walters, male    DOB: 08-30-1931, 82 y.o.   MRN: [id number]  HPI At last visit he was having issues with dependent edema. Increased his Lasix from 20 mg which he had run out off to 40 mg daily. He's here today for follow-up. Low back pain with right-sided sciatica.   Review of Systems     Objective:   Physical Exam He still has significant pitting edema of the lower extremities. Straight leg raising is negative at 90 bilaterally      Assessment & Plan:  Dependent edema. Prescription given for teds hose. He is on chronic anticoagulation.  Right low back pain with sciatica  Plan: Tramadol 50 mg daily if needed for back pain. Prescription for teds hose for dependent edema. Continue dose of Lasix at 40 mg daily. Keep feet elevated.Basic metabolic panel drawn. Follow-up with physical exam in November.  Addendum: B- met is normal

## 2016-02-10 DIAGNOSIS — H2512 Age-related nuclear cataract, left eye: Secondary | ICD-10-CM | POA: Diagnosis not present

## 2016-02-16 ENCOUNTER — Other Ambulatory Visit: Payer: Self-pay | Admitting: Internal Medicine

## 2016-02-21 ENCOUNTER — Ambulatory Visit (INDEPENDENT_AMBULATORY_CARE_PROVIDER_SITE_OTHER): Payer: 59 | Admitting: Pharmacist

## 2016-02-21 DIAGNOSIS — Z7901 Long term (current) use of anticoagulants: Secondary | ICD-10-CM | POA: Diagnosis not present

## 2016-02-21 DIAGNOSIS — I4892 Unspecified atrial flutter: Secondary | ICD-10-CM | POA: Diagnosis not present

## 2016-02-21 LAB — POCT INR: INR: 1.9

## 2016-02-29 ENCOUNTER — Ambulatory Visit: Payer: 59 | Admitting: Internal Medicine

## 2016-03-03 ENCOUNTER — Ambulatory Visit: Payer: 59 | Admitting: Internal Medicine

## 2016-03-06 ENCOUNTER — Telehealth: Payer: Self-pay | Admitting: Internal Medicine

## 2016-03-06 NOTE — Telephone Encounter (Signed)
Pt will call back with pharmacy info

## 2016-03-06 NOTE — Telephone Encounter (Signed)
°*  STAT* If patient is at the pharmacy, call can be transferred to refill team.   1. Which medications need to be refilled? (please list name of each medication and dose if known) Cardura  2. Which pharmacy/location (including [place] and city if local pharmacy) is medication to be sent to?Phone [phone] did not know the name of the Rx,said you would know who it was  3. Do they need a 30 day or 90 day supply? 90 and refills

## 2016-03-07 ENCOUNTER — Other Ambulatory Visit: Payer: Self-pay

## 2016-03-07 MED ORDER — DOXAZOSIN MESYLATE 4 MG PO TABS: 4.0000 mg | ORAL_TABLET | Freq: Every day | ORAL | 0 refills | Status: DC

## 2016-03-07 NOTE — Telephone Encounter (Signed)
Follow up   [facility]  doxazosin mesyl '4mg'$  generic brand for cardura

## 2016-03-09 ENCOUNTER — Other Ambulatory Visit: Payer: Self-pay | Admitting: Internal Medicine

## 2016-03-13 ENCOUNTER — Ambulatory Visit (INDEPENDENT_AMBULATORY_CARE_PROVIDER_SITE_OTHER): Payer: 59 | Admitting: Pharmacist Clinician (PhC)/ Clinical Pharmacy Specialist

## 2016-03-13 DIAGNOSIS — I4892 Unspecified atrial flutter: Secondary | ICD-10-CM | POA: Diagnosis not present

## 2016-03-13 DIAGNOSIS — Z7901 Long term (current) use of anticoagulants: Secondary | ICD-10-CM

## 2016-03-13 LAB — POCT INR: INR: 2.2

## 2016-04-10 ENCOUNTER — Ambulatory Visit (INDEPENDENT_AMBULATORY_CARE_PROVIDER_SITE_OTHER): Payer: 59 | Admitting: Pharmacist

## 2016-04-10 DIAGNOSIS — I4892 Unspecified atrial flutter: Secondary | ICD-10-CM | POA: Diagnosis not present

## 2016-04-10 DIAGNOSIS — Z7901 Long term (current) use of anticoagulants: Secondary | ICD-10-CM

## 2016-04-10 LAB — POCT INR: INR: 3

## 2016-05-02 ENCOUNTER — Encounter: Payer: Self-pay | Admitting: Internal Medicine

## 2016-05-02 ENCOUNTER — Ambulatory Visit (INDEPENDENT_AMBULATORY_CARE_PROVIDER_SITE_OTHER): Payer: 59 | Admitting: Internal Medicine

## 2016-05-02 ENCOUNTER — Ambulatory Visit (INDEPENDENT_AMBULATORY_CARE_PROVIDER_SITE_OTHER): Payer: 59 | Admitting: Pharmacist

## 2016-05-02 VITALS — BP 146/78 | HR 52 | Ht 68.0 in | Wt 138.8 lb

## 2016-05-02 DIAGNOSIS — I10 Essential (primary) hypertension: Secondary | ICD-10-CM

## 2016-05-02 DIAGNOSIS — I4892 Unspecified atrial flutter: Secondary | ICD-10-CM | POA: Diagnosis not present

## 2016-05-02 DIAGNOSIS — E782 Mixed hyperlipidemia: Secondary | ICD-10-CM | POA: Insufficient documentation

## 2016-05-02 DIAGNOSIS — Z7901 Long term (current) use of anticoagulants: Secondary | ICD-10-CM

## 2016-05-02 DIAGNOSIS — Z9889 Other specified postprocedural states: Secondary | ICD-10-CM

## 2016-05-02 DIAGNOSIS — Z8679 Personal history of other diseases of the circulatory system: Secondary | ICD-10-CM

## 2016-05-02 LAB — POCT INR: INR: 1.5

## 2016-05-02 NOTE — Progress Notes (Signed)
OFFICE NOTE  Chief Complaint:  No complaints  Primary Care Physician: Elby Showers, MD  HPI:  Jay Walters is a 82 year old gentleman who was hospitalized in November 2013 with a right brain stroke with left hemiparesis and aphasia. He was previously followed by Dr. Rollene Fare. At that time, he was seen and was in atrial flutter and felt to be potentially a candidate for A-flutter ablation due to his bradycardia; however, the timing was such that it was recommended that this would be postponed until after he has recovered. He also has a history of a remote ASD repair and was on Coumadin in the past but has not been on that recently. He was restarted on Coumadin after there was time to allow for recovery from the stroke. He did have a CT angiogram of the neck which showed question of thrombus or atherosclerotic plaque at the carotid bifurcation and an ultrasound of the neck was repeated in our office on June 26, 2011, which demonstrated no significant carotid stenosis.  Jay Walters returns today for follow-up. He's had some back injections for pain but he reports only last for about 1 day. He's also had problems with his right knee. This was replaced over year ago at [place] and he reports instability and pain in that knee. He's had falls recently including a fall that caused a laceration above his left eyebrow which was sutured. His head was scanned and fortunately there was no intracranial bleeding on warfarin. In October he was in the hospital for what was thought to be a syncopal event however he feels that he fell due to tripping on the carpet. He denies any loss of consciousness. Heart rate was low in the 40s and he's had problems with bradycardia in the past. There is been some medication adjustments and he's felt pretty well since then. He was supposed to get a follow-up monitor but that was never arranged.  I saw Jay Walters in the office today for follow-up. Overall he  feels well except he continues to have problems with the right knee. I sent him for a second opinion with Dr. [name] at [facility] orthopedics. He felt that he could do some cleaning up of the knee but could not "guarantee" Jay Walters that he be free of pain. Based on this, [name] didn't think that additional surgery was a good idea. His INR today was 3.6 in the office and he was advised to hold his dose tonight and additional dose adjustments of warfarin will be made. He denies any recurrent atrial flutter. Heart rate today is stable in the 50s with a first-degree AV block, not uncommon after ablation. Blood pressure is at goal.  05/02/2016  Jay Walters returns today for follow-up. Overall he is doing fairly well. He continues to have problems with orthopedic issues particularly in the knees. He has lower extremity swelling which is due to venous insufficiency and wears compression stockings. He's not had any recurrent A. fib. Warfarin level was slightly low today and he will need adjustments per pharmacy. Blood pressure is borderline elevated but I will not change that today as he has a primary care follow-up in one week.  PMHx:  Past Medical History:  Diagnosis Date  . Anemia   . Arthritis    "right hip; right knee; lower back ~ 1/2 way across"  . Atrial fibrillation ([place])    remotely   . Atrial flutter ([place])    typical appearing by ekg. s/p ablation 07/28/11  .  Diverticulosis   . Dysphagia    with solids  . Esophageal stricture   . GERD (gastroesophageal reflux disease)   . H/O hiatal hernia   . Heart murmur   . Hemorrhoids   . High cholesterol   . Hypertension   . Osteopenia   . Seizures ([place])    "back w/migraine headaches; 1990's or before"  . Sinus bradycardia    asymptomatic  . SSS (sick sinus syndrome) ([place])   . Stroke [facility]) 2012   "left me weaker on left side; balance is not good"    Past Surgical History:  Procedure Laterality Date  . ASD REPAIR  1972   Patch repair    . ATRIAL FLUTTER ABLATION N/A 07/28/2011   Procedure: ATRIAL FLUTTER ABLATION;  Surgeon: Thompson Grayer, MD;  Location: [facility] CATH LAB;  Service: Cardiovascular;  Laterality: N/A;  . CARDIAC ELECTROPHYSIOLOGY STUDY AND ABLATION  07/28/11  . CARDIOVASCULAR STRESS TEST  03/07/2010   Perfusion defect in the inferior myocardial region is consistent with diaphragmatic attenuation. No ischemia or infarct/scar is seen in the remaining myocardium. No ECG changes.  . CAROTID DOPPLER  06/26/2011   Bilateral ICAs-demonstrated normal patency without eivdence of significant diameter reduction, dissection, or any other vascular abnormality.  . INGUINAL HERNIA REPAIR  2011   right  . JOINT REPLACEMENT     right knee replacement  . KNEE ARTHROSCOPY     right; "maybe twice"  . TOTAL KNEE ARTHROPLASTY  07/10/2012   Procedure: TOTAL KNEE ARTHROPLASTY;  Surgeon: Tobi Bastos, MD;  Location: [facility] ORS;  Service: Orthopedics;  Laterality: Right;  . TRANSTHORACIC ECHOCARDIOGRAM  12/10/2012   EF 55-60%. No regional wall motion abnormalities. LA moderately dialted.Mild-moderate regurg of the tricuspid valve.    FAMHx:  Family History  Problem Relation Age of Onset  . Heart disease Father   . Cancer Sister     unsure what type    SOCHx:   reports that he has never smoked. He has never used smokeless tobacco. He reports that he drinks alcohol. He reports that he does not use drugs.  ALLERGIES:  No Known Allergies  ROS: Pertinent items noted in HPI and remainder of comprehensive ROS otherwise negative.  HOME MEDS: Current Outpatient Prescriptions  Medication Sig Dispense Refill  . amitriptyline (ELAVIL) 10 MG tablet Take 1 tablet (10 mg total) by mouth at bedtime. 30 tablet 5  . atorvastatin (LIPITOR) 20 MG tablet Take 1 tablet by mouth  daily 90 tablet 3  . BESIVANCE 0.6 % SUSP     . bimatoprost (LUMIGAN) 0.03 % ophthalmic solution Place 1 drop into both eyes at bedtime. For 3 weeks, then return for a check  up. Starting on 03/13/2014.    Marland Kitchen doxazosin (CARDURA) 4 MG tablet Take 1 tablet (4 mg total) by mouth daily. 90 tablet 0  . DUREZOL 0.05 % EMUL     . ferrous sulfate 325 (65 FE) MG tablet Take 325 mg by mouth 2 (two) times daily after a meal.     . finasteride (PROSCAR) 5 MG tablet Take 1 tablet (5 mg total) by mouth daily. 30 tablet 11  . furosemide (LASIX) 40 MG tablet Take 1 tablet (40 mg total) by mouth daily. 30 tablet 3  . gabapentin (NEURONTIN) 300 MG capsule Take 300 mg by mouth 2 (two) times daily.    Marland Kitchen levothyroxine (SYNTHROID, LEVOTHROID) 50 MCG tablet Take 1 tablet (50 mcg total) by mouth daily. 90 tablet 0  . mirabegron ER (  MYRBETRIQ) 25 MG TB24 tablet Take 25 mg by mouth daily.    . Multiple Vitamin (MULTIVITAMIN WITH MINERALS) TABS tablet Take 1 tablet by mouth daily.    . naproxen sodium (ALEVE) 220 MG tablet Take 220 mg by mouth daily as needed (arthritis pain).    . phenytoin (DILANTIN) 100 MG ER capsule Take 3 caps QHS 270 capsule 3  . Polyethyl Glycol-Propyl Glycol (SYSTANE ULTRA) 0.4-0.3 % SOLN Place 1 drop into both eyes daily.    Marland Kitchen PROLENSA 0.07 % SOLN     . tamsulosin (FLOMAX) 0.4 MG CAPS capsule     . traMADol (ULTRAM) 50 MG tablet Take 1 tablet (50 mg total) by mouth every 8 (eight) hours as needed. 30 tablet 0  . TRAVATAN Z 0.004 % SOLN ophthalmic solution Place 1 drop into both eyes daily.    Marland Kitchen warfarin (COUMADIN) 5 MG tablet TAKE 1 TABLET DAILY OR AS DIRECTED BY COUMADIN CLINIC 90 tablet 0   No current facility-administered medications for this visit.     LABS/IMAGING: No results found for this or any previous visit (from the past 48 hour(s)). No results found.  VITALS: BP (!) 146/78   Pulse (!) 52   Ht '5\' 8"'$  (1.727 m)   Wt 138 lb 12.8 oz (63 kg)   BMI 21.10 kg/m   EXAM: General appearance: alert and no distress Neck: no carotid bruit, no JVD and thyroid not enlarged, symmetric, no tenderness/mass/nodules Lungs: clear to auscultation bilaterally Heart:  regular rate and rhythm, S1, S2 normal, no murmur, click, rub or gallop Abdomen: soft, non-tender; bowel sounds normal; no masses,  no organomegaly Extremities: extremities normal, atraumatic, no cyanosis or edema Pulses: 2+ and symmetric Skin: Skin color, texture, turgor normal. No rashes or lesions Neurologic: Mental status: Alert, oriented, thought content appropriate PSych: Normal  EKG: Sinus bradycardia with first-degree AV block at 52  ASSESSMENT: 1. Paroxysmal atrial fibrillation status post ablation, maintaining sinus - CHADSVASC score 5 2. Hypertension 3. Prior stroke with aphasia and balance problems 4. Ongoing low back pain 5. Chronic anticoagulation on warfarin 6. History of atrial septal defect status post patch repair  PLAN: 1.   Jay Walters is doing fairly well unfortunately with chronic low back pain and knee problems. He's had no recurrent atrial fibrillation that we can tell. Given his high CHADSVASC score 5 he is on chronic warfarin anticoagulation. INR slightly low today and it will be readjusted by her pharmacist. He is long overdue for recheck of cholesterol which is well-controlled in 2015. I gave him a lipid profile ordered today and he can take that with him to his primary care provider in one week when he sees her as she may wish to add additional lab work. It is also noted his blood pressure is top normal today. I will not make adjustments on his medicines and defer to his PCP if it remains elevated in her office he may need additional treatment.  Up with me annually or sooner as necessary.  Pixie Casino, MD, [facility] Attending Cardiologist [place] 05/02/2016, 3:29 PM

## 2016-05-02 NOTE — Patient Instructions (Signed)
Medication Instructions:  Your physician recommends that you continue on your current medications as directed. Please refer to the Current Medication list given to you today.  Labwork: None  Testing/Procedures: None   Follow-Up: Your physician wants you to follow-up in: 12 MONTHS WITH DR HILTY. You will receive a reminder letter in the mail two months in advance. If you don't receive a letter, please call our office to schedule the follow-up appointment.  Any Other Special Instructions Will Be Listed Below (If Applicable).     If you need a refill on your cardiac medications before your next appointment, please call your pharmacy.

## 2016-05-11 ENCOUNTER — Encounter: Payer: Self-pay | Admitting: Internal Medicine

## 2016-05-11 ENCOUNTER — Ambulatory Visit (INDEPENDENT_AMBULATORY_CARE_PROVIDER_SITE_OTHER): Payer: 59 | Admitting: Internal Medicine

## 2016-05-11 VITALS — BP 120/68 | HR 52 | Temp 97.2°F | Ht 68.0 in | Wt 127.0 lb

## 2016-05-11 DIAGNOSIS — E559 Vitamin D deficiency, unspecified: Secondary | ICD-10-CM

## 2016-05-11 DIAGNOSIS — R609 Edema, unspecified: Secondary | ICD-10-CM

## 2016-05-11 DIAGNOSIS — Z0000 Encounter for general adult medical examination without abnormal findings: Secondary | ICD-10-CM | POA: Diagnosis not present

## 2016-05-11 DIAGNOSIS — Z23 Encounter for immunization: Secondary | ICD-10-CM

## 2016-05-11 DIAGNOSIS — M5431 Sciatica, right side: Secondary | ICD-10-CM | POA: Diagnosis not present

## 2016-05-11 DIAGNOSIS — Z7901 Long term (current) use of anticoagulants: Secondary | ICD-10-CM | POA: Diagnosis not present

## 2016-05-11 DIAGNOSIS — Z8669 Personal history of other diseases of the nervous system and sense organs: Secondary | ICD-10-CM

## 2016-05-11 DIAGNOSIS — M4712 Other spondylosis with myelopathy, cervical region: Secondary | ICD-10-CM

## 2016-05-11 DIAGNOSIS — Z9889 Other specified postprocedural states: Secondary | ICD-10-CM

## 2016-05-11 DIAGNOSIS — I10 Essential (primary) hypertension: Secondary | ICD-10-CM | POA: Diagnosis not present

## 2016-05-11 DIAGNOSIS — N401 Enlarged prostate with lower urinary tract symptoms: Secondary | ICD-10-CM | POA: Diagnosis not present

## 2016-05-11 DIAGNOSIS — Z8774 Personal history of (corrected) congenital malformations of heart and circulatory system: Secondary | ICD-10-CM

## 2016-05-11 DIAGNOSIS — Z96651 Presence of right artificial knee joint: Secondary | ICD-10-CM | POA: Diagnosis not present

## 2016-05-11 DIAGNOSIS — Z8639 Personal history of other endocrine, nutritional and metabolic disease: Secondary | ICD-10-CM

## 2016-05-11 DIAGNOSIS — Z8679 Personal history of other diseases of the circulatory system: Secondary | ICD-10-CM | POA: Diagnosis not present

## 2016-05-11 DIAGNOSIS — E782 Mixed hyperlipidemia: Secondary | ICD-10-CM | POA: Diagnosis not present

## 2016-05-11 DIAGNOSIS — E039 Hypothyroidism, unspecified: Secondary | ICD-10-CM

## 2016-05-11 DIAGNOSIS — R296 Repeated falls: Secondary | ICD-10-CM

## 2016-05-11 DIAGNOSIS — R7302 Impaired glucose tolerance (oral): Secondary | ICD-10-CM

## 2016-05-11 NOTE — Progress Notes (Signed)
Subjective:    Patient ID: Jay Walters, male    DOB: 01-May-1932, 82 y.o.   MRN: [id number]  HPI  82 year old Male for health maintenance exam and evaluation of medical issues.He has a complicated medical history. Currently, having right back pain in buttock and right knee pain.Knee pain has been an issue for some time. It is affecting ambulation and creating a hazard for fall situation. While recovering from right knee replacement in February 2014 at [facility] place he had an episode of confusion and slurred speech and was found to be Dilantin toxic. He was admitted to the hospital and improved.  Arrange for orthopedic consultation at [facility] for an opinion regarding what to do about his chronic knee pain.  Patient was hospitalized November 2012 with cervical myelopathy possibly due to transverse myelitis. He also had an episode of atrial flutter at that time. He was treated with IV steroids and improved. He was admitted to rehabilitation unit for physical therapy. He is now on Coumadin.  During his evaluation in 2012 he had a CT of the chest that suggested pulmonary artery hypertension. There were ground glass opacities in both upper lobes. There was central enlargement of the pulmonary arteries consistent with pulmonary artery hypertension. No mediastinal or hilar lymph nodes were noted. No pleural or pericardial effusion noted.  He also had a CT of the abdomen and pelvis in 2012 which was negative for malignancy.  Patient's hand grip was good but he had upper extremity weakness bilaterally with muscle strength being 3/5 bilaterally in the proximal extremities.  He had an MRI of the C-spine showing abnormal signal within the spinal cord bilaterally beginning at the C2-C3 level extending to C5. No cord hemorrhage was noted. The largest area of hyperintensity in the cord was on the right C3 level. Patient had mild spinal stenosis C2-C3, C3-C4, C4-C5 on the left C5-C6 and C6-C7. This  was unchanged from prior study.  Patient had CT angiography of the neck showing abnormal appearance of the carotid bifurcation bilaterally. It was felt the patient could have thrombus or plaque bilaterally. Less than 50% diameter stenosis of the proximal internal carotid artery bilaterally. No high-grade stenosis of either vertebral artery. Findings raise the possibility of fibromuscular dysplasia involving the distal vertical segment of the right internal carotid artery and left vertebral artery at the C2 level.  He was started on heparin in addition to IV steroids and eventually changed to Coumadin. He suffered a fall while in the hospital in 2012. CT of the brain without contrast showed minimal atrophy and no mass effect. He has small vessel chronic ischemic changes of the deep cerebral white matter. No evidence of acute infarction.  He had a spinal tap while hospitalized. Cytology of the spinal fluid was negative. Cryptococcal antigen was negative. VDRL was negative on spinal fluid. Spinal fluid glucose was 66 and protein was 69. CSF for oligoclonal banding was negative. Spinal fluid ACE level was 2. Culture was negative of the spinal fluid. Sedimentation rate was 4. TSH was 4.185. He was diagnosed with hypothyroidism. 2-D echocardiogram showed no evidence of intracardiac thrombi masses and no PFO.  Patient has history in the remote past of a seizure disorder and has been maintained on Dilantin 300 mg at bedtime for many years.  In February 2013 Dr. Rayann Heman performed atrial flutter ablation as it was noted in 2012 hospitalization he had atrial flutter with ventricular rate in the 40s with associated fatigue.  He has a history  of vitamin D deficiency.  History of GE reflux  Right inguinal hernia repair by Dr. Rise Patience in 2011  Patient had normal colonoscopy 2003 by Dr. Fuller Plan. He had diverticulosis and internal hemorrhoids.  He had upper endoscopy in 2012 for reflux and dysphagia. He was found  to have esophagitis in the distal esophagus with scattered white exudates.  From time to time he's had epidural steroid injections by Dr. Nelva Bush.  History of hyperlipidemia  Social history: He is now retired. 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 After his knee replacement in 2014, he never really got back to baseline. He stays at home. He no longer drives. He is married. Has one adult son who lives at home. Wife works outside the home. Does not smoke or consume alcohol. He hasn't adult daughter. This is his second marriage. Son is from second marriage.  Had bilateral cataract surgery recently  by Dr. Katy Fitch.  He has history of atrial flutter and is on chronic anticoagulation therapy.  Issues with dependent edema but he sits with his feet down most of the time. Ambulates with a walker.  Continues to see Urologist for BPH.     Family history: Brother died recently for cancer not sure what type. Father died between the ages of 45 and 57 with history of heart failure and alcoholism. Mother with history of heart problems lived to be over 52 years of age. One brother died at age 80 with alcoholism and probable cirrhosis of the liver. 4 sisters.    In 2014, had MRI of back showing scoliosis and right foraminal stenosis L4-L5 and Moderate L3-L4.  Review of Systems appetite fair, sleeps okay, no chest pain, no shortness of breath     Objective:   Physical Exam  Constitutional: He is oriented to person, place, and time. He appears well-developed and well-nourished. No distress.  HENT:  Head: Normocephalic and atraumatic.  Right Ear: External ear normal.  Left Ear: External ear normal.  Mouth/Throat: Oropharynx is clear and moist. No oropharyngeal exudate.  Eyes: Conjunctivae are normal. Pupils are equal, round, and reactive to light. Right eye exhibits no discharge. Left eye exhibits no discharge.  Neck: No JVD present.   Cardiovascular: Normal rate, regular rhythm and normal heart sounds.   Pulmonary/Chest: Effort normal and breath sounds normal. He has no wheezes. He has no rales.  Abdominal: Soft. Bowel sounds are normal. He exhibits no mass. There is no tenderness. There is no rebound.  Genitourinary:  Genitourinary Comments: Prostate exam deferred to urologist  Musculoskeletal:  Trace lower extremity edema  Lymphadenopathy:    He has no cervical adenopathy.  Neurological: He is alert and oriented to person, place, and time. No cranial nerve deficit. Coordination normal.  Skin: Skin is warm and dry. He is not diaphoretic.  Psychiatric: He has a normal mood and affect. His behavior is normal. Judgment and thought content normal.  Vitals reviewed.         Assessment & Plan:   Persistent right knee pain-orthopedic consultation pending. Status post right knee replacement 2014 by Dr. Gladstone Lighter. Also had right knee arthroscopic surgery by Dr. Fabienne Bruns free in 1991.  Right back pain with sciatica-has tramadol for pain. Also takes Aleve.  Status post bilateral cataract extractions 2017  Benign left neck mass removed by Dr. Constance Holster in 2007.  History of atrial flutter on chronic anticoagulation. Status post ablation by Dr. Rayann Heman 2013. Old records indicate  this was an issue requiring hospitalization  by Dr. Rollene Fare in 1988.  History of cervical myelopathy  History of seizure disorder-stable with Dilantin  Status post patch closure of ASD. Old records indicate this was done by Dr. Gordy Savers in 1972 and he has not had any other issues since that time except an episode of atrial fib equiring hospitalization in early 1990s converting spontaneously.  He had a negative Cardiolite study for ischemia in 2008 and 2011.  Hypertension-remote history currently just on Cardura and Lasix. Cardura was prescribed by urologist for BPH.  History of impaired glucose tolerance-hemoglobin A1c 2 years ago was 5.9% is now  5.4%  Dependent edema-treated with Lasix  Frequent falls  Gait disorder  History of right brain stroke with left hemi-paresis 2013  History of hyperlipidemia-on statin medication  History of glaucoma  Hypothyroidism-on thyroid replacement  BPH-is on Proscar, Cardura, Myrbetriq, Flomax. Dr. Terance Hart started him on Cardura in 2003.  Hypothyroidism  History of osteopenia diagnosed on bone density study 2011. He used to take Actonel around 2004-2007.  Low white blood cell count 3300 previously was 5001 year ago. Continue to monitor.  Plan: Medrol 4 mg dosepack for 6 days to see if pain will improve in right buttock and knee. Orthopedic consultation. Otherwise continue same medications and return in 6 months.    Subjective:   Patient presents for Medicare Annual/Subsequent preventive examination.  Review Past Medical/Family/Social:See above   Risk Factors  Current exercise habits: Sedentary Dietary issues discussed: Low fat low carbohydrate  Cardiac risk factors:Hyperlipidemia, family history, history of glucose tolerance  Depression Screen  (Note: if answer to either of the following is "Yes", a more complete depression screening is indicated)   Over the past two weeks, have you felt down, depressed or hopeless? No  Over the past two weeks, have you felt little interest or pleasure in doing things? No Have you lost interest or pleasure in daily life? No Do you often feel hopeless? No Do you cry easily over simple problems? No   Activities of Daily Living  In your present state of health, do you have any difficulty performing the following activities?:   Driving? No longer drives Managing money? No  Feeding yourself? No  Getting from bed to chair? No  Climbing a flight of stairs? Yes Preparing food and eating?: Yes due to ambulation problems Bathing or showering? No  Getting dressed: No  Getting to the toilet? No  Using the toilet:No  Moving around from place  to place: Yes In the past year have you fallen or had a near fall?: Yes Are you sexually active? No  Do you have more than one partner? No   Hearing Difficulties: No  Do you often ask people to speak up or repeat themselves? No  Do you experience ringing or noises in your ears? No  Do you have difficulty understanding soft or whispered voices? No  Do you feel that you have a problem with memory? Yes Do you often misplace items?Yes   Home Safety:  Do you have a smoke alarm at your residence? Yes Do you have grab bars in the bathroom?No Do you have throw rugs in your house? Yes   Cognitive Testing  Alert? Yes Normal Appearance?Yes  Oriented to person? Yes Place? Yes  Time? Yes  Recall of three objects? Not tested Can perform simple calculations? Not tested Displays appropriate judgment?Yes  Can read the correct time from a watch face?Yes   List the Names of Other  Physician/Practitioners you currently use:  See referral list for the physicians patient is currently seeing.     Review of Systems: As above   Objective:     General appearance: Appears stated age and thin. Head: Normocephalic, without obvious abnormality, atraumatic  Eyes: conj clear, EOMi PEERLA  Ears: normal TM's and external ear canals both ears  Nose: Nares normal. Septum midline. Mucosa normal. No drainage or sinus tenderness.  Throat: lips, mucosa, and tongue normal; teeth and gums normal  Neck: no adenopathy, no carotid bruit, no JVD, supple, symmetrical, trachea midline and thyroid not enlarged, symmetric, no tenderness/mass/nodules  No CVA tenderness.  Lungs: clear to auscultation bilaterally  Breasts: normal appearance, no masses or tenderness,  Heart: regular rate and rhythm, S1, S2 normal, no murmur, click, rub or gallop  Abdomen: soft, non-tender; bowel sounds normal; no masses, no organomegaly  Musculoskeletal: ROM normal in all joints, no crepitus, no deformity, Normal muscle strengthen. Back   is symmetric, no curvature. Skin: Skin color, texture, turgor normal. No rashes or lesions  Lymph nodes: Cervical, supraclavicular, and axillary nodes normal.  Neurologic: CN 2 -12 Normal, Normal symmetric reflexes. Normal coordination and gait  Psych: Alert & Oriented x 3, Mood appear stable.    Assessment:    Annual wellness medicare exam   Plan:    During the course of the visit the patient was educated and counseled about appropriate screening and preventive services including:  Annual PSA  Annual flu vaccine      Patient Instructions (the written plan) was given to the patient.  Medicare Attestation  I have personally reviewed:  The patient's medical and social history  Their use of alcohol, tobacco or illicit drugs  Their current medications and supplements  The patient's functional ability including ADLs,fall risks, home safety risks, cognitive, and hearing and visual impairment  Diet and physical activities  Evidence for depression or mood disorders  The patient's weight, height, BMI, and visual acuity have been recorded in the chart. I have made referrals, counseling, and provided education to the patient based on review of the above and I have provided the patient with a written personalized care plan for preventive services.

## 2016-05-15 ENCOUNTER — Ambulatory Visit (INDEPENDENT_AMBULATORY_CARE_PROVIDER_SITE_OTHER): Payer: 59 | Admitting: Pharmacist Clinician (PhC)/ Clinical Pharmacy Specialist

## 2016-05-15 ENCOUNTER — Telehealth: Payer: Self-pay | Admitting: Internal Medicine

## 2016-05-15 DIAGNOSIS — Z7901 Long term (current) use of anticoagulants: Secondary | ICD-10-CM | POA: Diagnosis not present

## 2016-05-15 DIAGNOSIS — I4892 Unspecified atrial flutter: Secondary | ICD-10-CM | POA: Diagnosis not present

## 2016-05-15 LAB — POCT INR: INR: 1.5

## 2016-05-15 MED ORDER — TAMSULOSIN HCL 0.4 MG PO CAPS
0.4000 mg | ORAL_CAPSULE | Freq: Two times a day (BID) | ORAL | 3 refills | Status: DC
Start: 2016-05-15 — End: 2016-12-05

## 2016-05-15 NOTE — Telephone Encounter (Signed)
Tamsulosin refilled per verbal per Dr Renold Genta.

## 2016-05-15 NOTE — Telephone Encounter (Signed)
Wife states that patient is unable to get in to see the Urologist to get a refill for quite some time.  Wants to know if you will refill this for the patient.  He doesn't have any refills on this.  She's calling to schedule for him to come back in to do his fasting lab's on this Friday.    Flomax 0.4 mg.  He takes this bid.    Pharmacy:  [facility] @ [address] and [address] [place].  Best # for contact:  [phone] (ok to leave a message on home machine)

## 2016-05-19 ENCOUNTER — Other Ambulatory Visit: Payer: 59 | Admitting: Internal Medicine

## 2016-05-19 NOTE — Addendum Note (Signed)
Addended by: Amado Coe on: 05/19/2016 09:40 AM   Modules accepted: Orders

## 2016-05-26 ENCOUNTER — Other Ambulatory Visit: Payer: 59 | Admitting: Internal Medicine

## 2016-05-26 DIAGNOSIS — E119 Type 2 diabetes mellitus without complications: Secondary | ICD-10-CM

## 2016-05-26 DIAGNOSIS — Z0000 Encounter for general adult medical examination without abnormal findings: Secondary | ICD-10-CM

## 2016-05-26 DIAGNOSIS — E782 Mixed hyperlipidemia: Secondary | ICD-10-CM

## 2016-05-26 DIAGNOSIS — R7302 Impaired glucose tolerance (oral): Secondary | ICD-10-CM

## 2016-05-26 DIAGNOSIS — E559 Vitamin D deficiency, unspecified: Secondary | ICD-10-CM

## 2016-05-26 DIAGNOSIS — I10 Essential (primary) hypertension: Secondary | ICD-10-CM

## 2016-05-26 LAB — COMPLETE METABOLIC PANEL WITH GFR
ALT: 15 U/L (ref 9–46)
AST: 17 U/L (ref 10–35)
Albumin: 3.5 g/dL — ABNORMAL LOW (ref 3.6–5.1)
Alkaline Phosphatase: 56 U/L (ref 40–115)
BILIRUBIN TOTAL: 0.4 mg/dL (ref 0.2–1.2)
BUN: 13 mg/dL (ref 7–25)
CALCIUM: 8.9 mg/dL (ref 8.6–10.3)
CO2: 26 mmol/L (ref 20–31)
CREATININE: 0.84 mg/dL (ref 0.70–1.11)
Chloride: 107 mmol/L (ref 98–110)
GFR, Est Non African American: 82 mL/min (ref >=60)
Glucose, Bld: 87 mg/dL (ref 65–99)
Potassium: 4.1 mmol/L (ref 3.5–5.3)
Sodium: 143 mmol/L (ref 135–146)
TOTAL PROTEIN: 6.6 g/dL (ref 6.1–8.1)

## 2016-05-26 LAB — LIPID PANEL
Cholesterol: 166 mg/dL (ref <200)
HDL: 55 mg/dL (ref >40)
LDL Cholesterol: 91 mg/dL (ref <100)
TRIGLYCERIDES: 98 mg/dL (ref <150)
Total CHOL/HDL Ratio: 3 Ratio (ref <5.0)
VLDL: 20 mg/dL (ref <30)

## 2016-05-26 LAB — CBC WITH DIFFERENTIAL/PLATELET
BASOS PCT: 1 %
Basophils Absolute: 33 cells/uL (ref 0–200)
EOS PCT: 4 %
Eosinophils Absolute: 132 cells/uL (ref 15–500)
HCT: 40.6 % (ref 38.5–50.0)
Hemoglobin: 13.3 g/dL (ref 13.2–17.1)
LYMPHS PCT: 51 %
Lymphs Abs: 1683 cells/uL (ref 850–3900)
MCH: 30.7 pg (ref 27.0–33.0)
MCHC: 32.8 g/dL (ref 32.0–36.0)
MCV: 93.8 fL (ref 80.0–100.0)
MONOS PCT: 7 %
MPV: 10.9 fL (ref 7.5–12.5)
Monocytes Absolute: 231 cells/uL (ref 200–950)
NEUTROS PCT: 37 %
Neutro Abs: 1221 cells/uL — ABNORMAL LOW (ref 1500–7800)
PLATELETS: 204 10*3/uL (ref 140–400)
RBC: 4.33 MIL/uL (ref 4.20–5.80)
RDW: 14.5 % (ref 11.0–15.0)
WBC: 3.3 10*3/uL — AB (ref 3.8–10.8)

## 2016-05-26 LAB — HEMOGLOBIN A1C
Hgb A1c MFr Bld: 5.4 % (ref <5.7)
MEAN PLASMA GLUCOSE: 108 mg/dL

## 2016-05-26 LAB — PSA: PSA: 1.5 ng/mL (ref <=4.0)

## 2016-05-27 LAB — VITAMIN D 25 HYDROXY (VIT D DEFICIENCY, FRACTURES): Vit D, 25-Hydroxy: 36 ng/mL (ref 30–100)

## 2016-05-27 LAB — MICROALBUMIN, URINE: Microalb, Ur: 0.5 mg/dL

## 2016-05-31 ENCOUNTER — Encounter (INDEPENDENT_AMBULATORY_CARE_PROVIDER_SITE_OTHER): Payer: Self-pay | Admitting: Orthopaedic Surgery

## 2016-05-31 ENCOUNTER — Ambulatory Visit (INDEPENDENT_AMBULATORY_CARE_PROVIDER_SITE_OTHER): Payer: 59 | Admitting: Orthopaedic Surgery

## 2016-05-31 ENCOUNTER — Ambulatory Visit (INDEPENDENT_AMBULATORY_CARE_PROVIDER_SITE_OTHER): Payer: 59

## 2016-05-31 DIAGNOSIS — G8929 Other chronic pain: Secondary | ICD-10-CM | POA: Diagnosis not present

## 2016-05-31 DIAGNOSIS — M25561 Pain in right knee: Secondary | ICD-10-CM

## 2016-05-31 NOTE — Progress Notes (Signed)
Office Visit Note   Patient: Jay Walters           Date of Birth: 1931-06-18           MRN: [id number] Visit Date: 05/31/2016              Requested by: Elby Showers, MD [address] [place], [place] [id number] PCP: Elby Showers, MD   Assessment & Plan: Visit Diagnoses:  1. Chronic pain of right knee     Plan: I would not recommend a revision arthroplasty of his right knee. Although the implant hurts there is no effusion and he definitely has quad weakness. He is someone that would benefit from physical therapy to work on balance, coordination, quad strengthening, gait and any attempts at pain reduction. They can even consider a TENS unit. Again I would not recommend a revision arthroplasty of his knee based on his exam and the x-ray findings. I do feel this he wanted this proceed with a revision that it may cause more she's combined and certainly weaken the quads more. We'll send him to physical therapy and try a knee sleeve as well and see him back in about 6 weeks to see how he is doing from her progress standpoint and pain standpoint.  Follow-Up Instructions: Return in about 6 weeks (around 07/12/2016).   Orders:  Orders Placed This Encounter  Procedures  . XR Knee 1-2 Views Right   No orders of the defined types were placed in this encounter.     Procedures: No procedures performed   Clinical Data: No additional findings.   Subjective: Chief Complaint  Patient presents with  . Right Knee - Pain, Injury    S/P FALL 05/30/2016, fell down steps.      HPI The patient is a 82-year-old gentleman I've seen before. He has a remote history of a total knee replacement placed by [facility] years ago. He says that he has hurt ever since the surgery and he doesn't walk well and his legs are weak and give out on him. I've seen in my she for this before and have only recommended ever physical therapy for him. Review of Systems   Objective: Vital  Signs: There were no vitals taken for this visit.  Physical Exam  Ortho Exam His right knee is assessed and shows a well-healed surgical incision. He's got good range of motion of his right knee. It is ligamentous is stable. There is no effusion at all. Both his quadriceps are weak. Specialty Comments:  No specialty comments available.  Imaging: Xr Knee 1-2 Views Right  Result Date: 05/31/2016 2 views of his right knee show a right total knee arthroplasty. The femoral component and the tibial component appear well seated and a Alignment. There is no lucency around the implants to suggest loosening. There is calcifications in the quadriceps muscle near the patella it's more of calcinosis from the patella tendon itself but the implants in good position with no effusion and no, getting features.    PMFS History: Patient Active Problem List   Diagnosis Date Noted  . Mixed hyperlipidemia 05/02/2016  . Dependent edema 07/01/2015  . Syncope 03/23/2014  . Preoperative cardiovascular examination 01/06/2014  . S/P ablation of atrial flutter 03/24/2013  . Spinal stenosis of lumbar region 01/07/2013  . S/P right unicompartmental knee replacement 10/07/2012  . Avulsion fracture 10/07/2012  . Long term current use of anticoagulant therapy 09/28/2012  . Cervical disc disease with myelopathy 08/10/2012  .  HTN (hypertension) 07/28/2012  . Pulmonary nodules 09/19/2011  . Atrial flutter ([place]) 08/28/2011  . Allergic rhinitis 08/28/2011  . Status post patch closure of ASD 05/09/2011  . Seizure disorder ([place]) 04/18/2011  . BPH (benign prostatic hyperplasia) 04/18/2011  . Migraine headache 04/18/2011  . Osteopenia 04/18/2011  . Vitamin D deficiency 04/18/2011  . GERD 08/16/2010   Past Medical History:  Diagnosis Date  . Anemia   . Arthritis    "right hip; right knee; lower back ~ 1/2 way across"  . Atrial fibrillation ([place])    remotely   . Atrial flutter ([place])    typical appearing by ekg.  s/p ablation 07/28/11  . Diverticulosis   . Dysphagia    with solids  . Esophageal stricture   . GERD (gastroesophageal reflux disease)   . H/O hiatal hernia   . Heart murmur   . Hemorrhoids   . High cholesterol   . Hypertension   . Osteopenia   . Seizures ([place])    "back w/migraine headaches; 1990's or before"  . Sinus bradycardia    asymptomatic  . SSS (sick sinus syndrome) ([place])   . Stroke [facility]) 2012   "left me weaker on left side; balance is not good"    Family History  Problem Relation Age of Onset  . Heart disease Father   . Cancer Sister     unsure what type    Past Surgical History:  Procedure Laterality Date  . ASD REPAIR  1972   Patch repair  . ATRIAL FLUTTER ABLATION N/A 07/28/2011   Procedure: ATRIAL FLUTTER ABLATION;  Surgeon: Thompson Grayer, MD;  [facility] LAB;  Service: Cardiovascular;  Laterality: N/A;  . CARDIAC ELECTROPHYSIOLOGY STUDY AND ABLATION  07/28/11  . CARDIOVASCULAR STRESS TEST  03/07/2010   Perfusion defect in the inferior myocardial region is consistent with diaphragmatic attenuation. No ischemia or infarct/scar is seen in the remaining myocardium. No ECG changes.  . CAROTID DOPPLER  06/26/2011   Bilateral ICAs-demonstrated normal patency without eivdence of significant diameter reduction, dissection, or any other vascular abnormality.  . INGUINAL HERNIA REPAIR  2011   right  . JOINT REPLACEMENT     right knee replacement  . KNEE ARTHROSCOPY     right; "maybe twice"  . TOTAL KNEE ARTHROPLASTY  07/10/2012   Procedure: TOTAL KNEE ARTHROPLASTY;  Surgeon: Tobi Bastos, MD;  Location: [facility] ORS;  Service: Orthopedics;  Laterality: Right;  . TRANSTHORACIC ECHOCARDIOGRAM  12/10/2012   EF 55-60%. No regional wall motion abnormalities. LA moderately dialted.Mild-moderate regurg of the tricuspid valve.   Social History   Occupational History  . retired but still works part-time [employer]  . Smoking  status: Never Smoker  . Smokeless tobacco: Never Used  . Alcohol use 0.0 oz/week     Comment: 07/28/11 "1/5 will last me a year; w/company"  . Drug use: No  . Sexual activity: Not Currently

## 2016-06-07 ENCOUNTER — Other Ambulatory Visit: Payer: Self-pay | Admitting: Internal Medicine

## 2016-06-09 ENCOUNTER — Other Ambulatory Visit: Payer: Self-pay | Admitting: Internal Medicine

## 2016-06-09 NOTE — Telephone Encounter (Signed)
Rx has been sent to the pharmacy electronically. ° °

## 2016-06-10 NOTE — Patient Instructions (Signed)
It was a pleasure to see you today. Please continue same medications and return in 6 months. We will arrange for second opinion regarding right knee pain. For knee pain and right sciatica we are going to try a six-day Medrol Dosepak.

## 2016-06-15 ENCOUNTER — Telehealth: Payer: Self-pay | Admitting: Internal Medicine

## 2016-06-15 ENCOUNTER — Telehealth: Payer: Self-pay | Admitting: Pharmacist Clinician (PhC)/ Clinical Pharmacy Specialist

## 2016-06-15 NOTE — Telephone Encounter (Signed)
Called patient and gave him his appt for INR check on 06-23-16 at 3:10.

## 2016-06-15 NOTE — Telephone Encounter (Signed)
Jay Walters is requesting some one give him call. Thanks.

## 2016-06-15 NOTE — Telephone Encounter (Signed)
Returned call to patient he stated he needed a INR appointment for next Friday.Advised scheduler will call back to schedule.

## 2016-07-06 ENCOUNTER — Ambulatory Visit (INDEPENDENT_AMBULATORY_CARE_PROVIDER_SITE_OTHER): Payer: 59 | Admitting: Pharmacist Clinician (PhC)/ Clinical Pharmacy Specialist

## 2016-07-06 DIAGNOSIS — Z7901 Long term (current) use of anticoagulants: Secondary | ICD-10-CM

## 2016-07-06 DIAGNOSIS — I4892 Unspecified atrial flutter: Secondary | ICD-10-CM | POA: Diagnosis not present

## 2016-07-06 LAB — POCT INR: INR: 2

## 2016-07-11 ENCOUNTER — Other Ambulatory Visit: Payer: Self-pay | Admitting: Internal Medicine

## 2016-07-12 ENCOUNTER — Ambulatory Visit (INDEPENDENT_AMBULATORY_CARE_PROVIDER_SITE_OTHER): Payer: 59 | Admitting: Orthopaedic Surgery

## 2016-08-04 ENCOUNTER — Ambulatory Visit (INDEPENDENT_AMBULATORY_CARE_PROVIDER_SITE_OTHER): Payer: 59 | Admitting: Pharmacist

## 2016-08-04 DIAGNOSIS — Z7901 Long term (current) use of anticoagulants: Secondary | ICD-10-CM | POA: Diagnosis not present

## 2016-08-04 DIAGNOSIS — I4892 Unspecified atrial flutter: Secondary | ICD-10-CM

## 2016-08-04 LAB — POCT INR: INR: 2.3

## 2016-08-07 ENCOUNTER — Telehealth: Payer: Self-pay | Admitting: Pharmacist

## 2016-08-07 MED ORDER — WARFARIN SODIUM 5 MG PO TABS: ORAL_TABLET | ORAL | 3 refills | Status: DC

## 2016-08-07 NOTE — Telephone Encounter (Signed)
Patient taking phenytoin daily. Per wife (ms [employer]) ; will prefer to continue warfarin therapy and avoid changes in medication at this time.  Thursdays better days for follow up appointment.   Recommended to contact neurologist/pcp if and explore options to change phenytoin still interested in NOACs

## 2016-09-05 ENCOUNTER — Other Ambulatory Visit: Payer: Self-pay | Admitting: Internal Medicine

## 2016-09-15 ENCOUNTER — Ambulatory Visit (INDEPENDENT_AMBULATORY_CARE_PROVIDER_SITE_OTHER): Payer: 59 | Admitting: Pharmacist

## 2016-09-15 DIAGNOSIS — I4892 Unspecified atrial flutter: Secondary | ICD-10-CM | POA: Diagnosis not present

## 2016-09-15 DIAGNOSIS — Z7901 Long term (current) use of anticoagulants: Secondary | ICD-10-CM | POA: Diagnosis not present

## 2016-09-15 LAB — POCT INR: INR: 2

## 2016-10-24 ENCOUNTER — Ambulatory Visit (INDEPENDENT_AMBULATORY_CARE_PROVIDER_SITE_OTHER): Payer: 59 | Admitting: Pharmacist Clinician (PhC)/ Clinical Pharmacy Specialist

## 2016-10-24 DIAGNOSIS — I4892 Unspecified atrial flutter: Secondary | ICD-10-CM

## 2016-10-24 DIAGNOSIS — Z7901 Long term (current) use of anticoagulants: Secondary | ICD-10-CM | POA: Diagnosis not present

## 2016-10-24 LAB — POCT INR: INR: 2

## 2016-12-01 ENCOUNTER — Other Ambulatory Visit: Payer: 59 | Admitting: Internal Medicine

## 2016-12-01 DIAGNOSIS — I10 Essential (primary) hypertension: Secondary | ICD-10-CM

## 2016-12-01 DIAGNOSIS — R7302 Impaired glucose tolerance (oral): Secondary | ICD-10-CM

## 2016-12-01 DIAGNOSIS — E782 Mixed hyperlipidemia: Secondary | ICD-10-CM

## 2016-12-01 DIAGNOSIS — E0590 Thyrotoxicosis, unspecified without thyrotoxic crisis or storm: Secondary | ICD-10-CM

## 2016-12-01 LAB — HEPATIC FUNCTION PANEL
ALK PHOS: 65 U/L (ref 40–115)
ALT: 19 U/L (ref 9–46)
AST: 16 U/L (ref 10–35)
Albumin: 3.6 g/dL (ref 3.6–5.1)
BILIRUBIN DIRECT: 0.1 mg/dL (ref <=0.2)
BILIRUBIN TOTAL: 0.4 mg/dL (ref 0.2–1.2)
Indirect Bilirubin: 0.3 mg/dL (ref 0.2–1.2)
Total Protein: 6.7 g/dL (ref 6.1–8.1)

## 2016-12-01 LAB — LIPID PANEL
CHOL/HDL RATIO: 3.1 ratio (ref <5.0)
CHOLESTEROL: 165 mg/dL (ref <200)
HDL: 53 mg/dL (ref >40)
LDL Cholesterol: 93 mg/dL (ref <100)
Triglycerides: 96 mg/dL (ref <150)
VLDL: 19 mg/dL (ref <30)

## 2016-12-01 LAB — BASIC METABOLIC PANEL
BUN: 13 mg/dL (ref 7–25)
CALCIUM: 9.1 mg/dL (ref 8.6–10.3)
CO2: 28 mmol/L (ref 20–31)
CREATININE: 0.82 mg/dL (ref 0.70–1.11)
Chloride: 103 mmol/L (ref 98–110)
GLUCOSE: 81 mg/dL (ref 65–99)
Potassium: 4 mmol/L (ref 3.5–5.3)
Sodium: 140 mmol/L (ref 135–146)

## 2016-12-02 LAB — TSH: TSH: 3.8 mIU/L (ref 0.40–4.50)

## 2016-12-02 LAB — MICROALBUMIN / CREATININE URINE RATIO
Creatinine, Urine: 96 mg/dL (ref 20–370)
MICROALB UR: 0.6 mg/dL
Microalb Creat Ratio: 6 mcg/mg creat (ref <30)

## 2016-12-02 LAB — HEMOGLOBIN A1C
Hgb A1c MFr Bld: 5.5 % (ref <5.7)
MEAN PLASMA GLUCOSE: 111 mg/dL

## 2016-12-05 ENCOUNTER — Encounter: Payer: Self-pay | Admitting: Internal Medicine

## 2016-12-05 ENCOUNTER — Ambulatory Visit (INDEPENDENT_AMBULATORY_CARE_PROVIDER_SITE_OTHER): Payer: 59 | Admitting: Internal Medicine

## 2016-12-05 VITALS — BP 100/60 | HR 68 | Temp 97.9°F | Ht 68.0 in

## 2016-12-05 DIAGNOSIS — R296 Repeated falls: Secondary | ICD-10-CM

## 2016-12-05 DIAGNOSIS — Z96651 Presence of right artificial knee joint: Secondary | ICD-10-CM

## 2016-12-05 DIAGNOSIS — I10 Essential (primary) hypertension: Secondary | ICD-10-CM | POA: Diagnosis not present

## 2016-12-05 DIAGNOSIS — N401 Enlarged prostate with lower urinary tract symptoms: Secondary | ICD-10-CM | POA: Diagnosis not present

## 2016-12-05 DIAGNOSIS — Z8679 Personal history of other diseases of the circulatory system: Secondary | ICD-10-CM

## 2016-12-05 DIAGNOSIS — G40909 Epilepsy, unspecified, not intractable, without status epilepticus: Secondary | ICD-10-CM

## 2016-12-05 DIAGNOSIS — R7302 Impaired glucose tolerance (oral): Secondary | ICD-10-CM | POA: Diagnosis not present

## 2016-12-05 DIAGNOSIS — Z8669 Personal history of other diseases of the nervous system and sense organs: Secondary | ICD-10-CM | POA: Diagnosis not present

## 2016-12-05 MED ORDER — DOXAZOSIN MESYLATE 4 MG PO TABS: ORAL_TABLET | ORAL | 3 refills | Status: DC

## 2016-12-05 MED ORDER — ATORVASTATIN CALCIUM 20 MG PO TABS: 20.0000 mg | ORAL_TABLET | Freq: Every day | ORAL | 3 refills | Status: DC

## 2016-12-05 MED ORDER — TAMSULOSIN HCL 0.4 MG PO CAPS: 0.4000 mg | ORAL_CAPSULE | Freq: Two times a day (BID) | ORAL | 3 refills | Status: DC

## 2016-12-05 MED ORDER — FINASTERIDE 5 MG PO TABS: 5.0000 mg | ORAL_TABLET | Freq: Every day | ORAL | 11 refills | Status: DC

## 2016-12-05 MED ORDER — TRAMADOL HCL 50 MG PO TABS: 50.0000 mg | ORAL_TABLET | Freq: Three times a day (TID) | ORAL | 0 refills | Status: DC | PRN

## 2016-12-05 MED ORDER — PHENYTOIN SODIUM EXTENDED 100 MG PO CAPS: ORAL_CAPSULE | ORAL | 3 refills | Status: DC

## 2016-12-05 NOTE — Progress Notes (Signed)
Subjective:    Patient ID: Jay Walters, male    DOB: 03/26/1932, 83 y.o.   MRN: [id number]  HPI  83 year old Male , history of atrial flutter on chronic anticoagulation, hyperlipidemia, GE reflux, spinal stenosis, chronic bilateral knee pain, BPH.  History of right knee arthroplasty February 2014. Continues to have issue with right knee pain. After hospitalization for knee arthroplasty, he was sent to [facility] for rehabilitation. He was found to be Dilantin toxic and had to be readmitted to the hospital and improved.  Recently of had an orthopedic consultation at [place] for an opinion regarding what to do about his chronic knee pain. They do not think another operation would help his situation.  Patient was hospitalized November 2012 with cervical myelopathy possibly due to transverse myelitis. He also had an episode of atrial flutter that time. He is now on Coumadin for that reason.  He has a history of cervical spinal stenosis.  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 At the time of his stroke he was in atrial flutter and was felt to be a candidate for a flutter ablation due to his bradycardia. This was done by Dr. Rayann Heman. He has a remote history of ASD repair and for that reason was on Coumadin in the remote past. He remains on chronic Coumadin therapy now and is followed for this in the cardiology clinic. He did have CT angiogram of the neck which showed question of thrombus or atherosclerotic plaque at the carotid bifurcation and an ultrasound of the neck was repeated January 2013 demonstrating no significant carotid stenosis.  He has frequent falls and this is concerning with him being on Coumadin. He actually Phyllis morning at home. Part of the issue is that he loses his balance. Unfortunately he is at home  sometimes alone. His wife continues to work 2 jobs. His son has been in some trouble with drugs and was in jail for while. Pt doesn't talk about this. I have spoken with his wife about his son's situation recently.  History of vitamin D deficiency and GE reflux.  Right inguinal hernia repair by Dr. Rise Patience in 2011.  Had upper endoscopy in 2012 for reflux and dysphagia. Was found to have esophagitis in the distal esophagus.  From time to time he's had epidural steroid injections by Dr. Nelva Bush.    Review of Systems no new complaints     Objective:   Physical Exam  HENT:  Head: Normocephalic and atraumatic.  Neck: No JVD present.  Cardiovascular: Normal rate, regular rhythm and normal heart sounds.   Pulmonary/Chest: Effort normal and breath sounds normal. No respiratory distress. He has no wheezes.  Musculoskeletal: He exhibits no edema.  Lymphadenopathy:    He has no cervical adenopathy.  Psychiatric: He has a normal mood and affect. His behavior is normal.          Assessment & Plan:  Frequent falls  Chronic Coumadin therapy prior history of stroke in atrial flutter  BPH  Patch closure of ASD 1972  History of atrial fib requiring hospitalization early 1990s  Hypertension  Impaired glucose tolerance  History of right brain stroke with left hemi-cyst 2013  Hyperlipidemia  Glaucoma  Hypothyroidism  Chronic back and knee pain. Have represcribed tramadol. Consider referral to pain management. We have ordered physical therapy  for him from time to time. He is status post right knee replacement 2014 but has had persistent right knee pain since.I don't feel his pain control is adequate but he is at home alone a fair amount of time. I do not know if physical therapy would help.  It might be good to have rehabilitation reevaluate him in the near future. He might be a candidate for chronic pain management. He will be due for physical exam here in 6 months.   Remote  history of seizure disorder maintained on Dilantin

## 2016-12-07 ENCOUNTER — Telehealth: Payer: Self-pay | Admitting: Internal Medicine

## 2016-12-07 MED ORDER — ATORVASTATIN CALCIUM 20 MG PO TABS: 20.0000 mg | ORAL_TABLET | Freq: Every day | ORAL | 3 refills | Status: DC

## 2016-12-07 MED ORDER — DOXAZOSIN MESYLATE 4 MG PO TABS: ORAL_TABLET | ORAL | 3 refills | Status: DC

## 2016-12-07 MED ORDER — FINASTERIDE 5 MG PO TABS: 5.0000 mg | ORAL_TABLET | Freq: Every day | ORAL | 11 refills | Status: AC

## 2016-12-07 MED ORDER — TAMSULOSIN HCL 0.4 MG PO CAPS: 0.4000 mg | ORAL_CAPSULE | Freq: Two times a day (BID) | ORAL | 3 refills | Status: AC

## 2016-12-07 MED ORDER — PHENYTOIN SODIUM EXTENDED 100 MG PO CAPS: ORAL_CAPSULE | ORAL | 3 refills | Status: DC

## 2016-12-07 NOTE — Telephone Encounter (Signed)
None of this was requested in the note she sent with him. Loma Sousa, can you please change these Rxs that were recently refilled?

## 2016-12-07 NOTE — Telephone Encounter (Signed)
Patient's wife called and wants to know if we will please send his prescriptions to his Mail Order please.  They use [facility].  They went to his local pharmacy.  She did not pick them up.  So, can we please change that for him?  And, [facility] can be removed.    Thank you.

## 2016-12-07 NOTE — Telephone Encounter (Signed)
Resent to [facility] the 5 medications ordered. I did not resend tramadol since it was a [employer].

## 2016-12-09 NOTE — Patient Instructions (Signed)
Have refilled tramadol for pain. Apparently had has not been picked up in some time. It may be adequate if he would take it consistently. I'm concerned about his frequent falls. We need to have him reassessed by rehabilitation and possibly pain management. I am not sure more physical therapy would help but  we can consider it.

## 2016-12-21 ENCOUNTER — Ambulatory Visit (INDEPENDENT_AMBULATORY_CARE_PROVIDER_SITE_OTHER): Payer: 59 | Admitting: Pharmacist

## 2016-12-21 DIAGNOSIS — Z7901 Long term (current) use of anticoagulants: Secondary | ICD-10-CM

## 2016-12-21 DIAGNOSIS — I4892 Unspecified atrial flutter: Secondary | ICD-10-CM | POA: Diagnosis not present

## 2016-12-21 LAB — POCT INR: INR: 1.8

## 2017-01-22 ENCOUNTER — Encounter: Payer: Self-pay | Admitting: Internal Medicine

## 2017-01-22 ENCOUNTER — Ambulatory Visit (INDEPENDENT_AMBULATORY_CARE_PROVIDER_SITE_OTHER): Payer: 59 | Admitting: Internal Medicine

## 2017-01-22 ENCOUNTER — Telehealth: Payer: Self-pay

## 2017-01-22 VITALS — BP 130/78 | HR 60 | Temp 98.5°F | Wt 130.0 lb

## 2017-01-22 DIAGNOSIS — R296 Repeated falls: Secondary | ICD-10-CM

## 2017-01-22 DIAGNOSIS — M25561 Pain in right knee: Secondary | ICD-10-CM

## 2017-01-22 DIAGNOSIS — I10 Essential (primary) hypertension: Secondary | ICD-10-CM

## 2017-01-22 DIAGNOSIS — Z7901 Long term (current) use of anticoagulants: Secondary | ICD-10-CM

## 2017-01-22 DIAGNOSIS — R600 Localized edema: Secondary | ICD-10-CM

## 2017-01-22 DIAGNOSIS — M25562 Pain in left knee: Secondary | ICD-10-CM | POA: Diagnosis not present

## 2017-01-22 MED ORDER — FUROSEMIDE 20 MG PO TABS: 20.0000 mg | ORAL_TABLET | Freq: Every day | ORAL | 0 refills | Status: DC

## 2017-01-22 NOTE — Progress Notes (Signed)
   Subjective:    Patient ID: Jay Walters, male    DOB: 08-04-1931, 83 y.o.   MRN: [id number]  HPI Patient called today complaining of swelling in his ankles. He was given an appointment for tomorrow but came today so we went ahead and saw him as he has difficulty with transportation.  His wife works 2 jobs. He used to have Lasix for dependent edema but apparently has not been filled in over a year. We have started him now on Lasix 20 mg daily as of today.  He ambulates with a walker but is very difficult for him to ambulate at all. He tells me spends most of his time on the second floor of his house. He has frequent falls at home. He doesn't go outside much. He has seen orthopedist regarding his knee pain. He has a history of right knee replacement February 2014. Continues to have pain. Also has some left knee pain.He had consultation at [facility] earlier this year and it was not felt that he would benefit from another knee surgery. We have prescribed tramadol in the past for pain. I don't think he steak it right now.  He hasn't had physical therapy in some time.     Review of Systems     Objective:   Physical Exam  Neck: Neck supple. No JVD present.  Cardiovascular: Normal rate, regular rhythm and normal heart sounds.   Pulmonary/Chest: Effort normal and breath sounds normal. No respiratory distress. He has no wheezes. He has no rales.  Musculoskeletal: He exhibits edema.  Skin: Skin is warm and dry.  Psychiatric: He has a normal mood and affect. His behavior is normal. Judgment and thought content normal.  Chronic anticoagulation          Assessment & Plan:  Bilateral knee pain right worse than left status post right knee arthroplasty for 2014  Dependent edema ankles and feet-start Lasix 20 mg daily and follow-up in 2 weeks  Frequent falls and ambulation issues-order home PT and nurse assessment  Chronic anticoagulation-worrisome for frequent falls  Plan:  Return in 2 weeks.

## 2017-01-22 NOTE — Telephone Encounter (Signed)
Appointment made for Tuesday, 8/14 @ 4pm.

## 2017-01-22 NOTE — Telephone Encounter (Signed)
Book appt tomorrow

## 2017-01-22 NOTE — Patient Instructions (Signed)
Lasix 20 mg daily. Home PT consultation requested return in 2 weeks

## 2017-01-22 NOTE — Telephone Encounter (Signed)
Pts called and stated that his legs are swollen and hurting him. He falls a lot to his knees. He is taking his lasix and gabapentin. Lower legs and feet are burning all the time. Wants advise on what to do or if he needs to come in

## 2017-01-23 ENCOUNTER — Ambulatory Visit: Payer: 59 | Admitting: Internal Medicine

## 2017-01-24 ENCOUNTER — Telehealth: Payer: Self-pay

## 2017-01-24 DIAGNOSIS — G40909 Epilepsy, unspecified, not intractable, without status epilepticus: Secondary | ICD-10-CM | POA: Diagnosis not present

## 2017-01-24 DIAGNOSIS — R296 Repeated falls: Secondary | ICD-10-CM | POA: Diagnosis not present

## 2017-01-24 DIAGNOSIS — M4802 Spinal stenosis, cervical region: Secondary | ICD-10-CM | POA: Diagnosis not present

## 2017-01-24 NOTE — Telephone Encounter (Signed)
Jay Walters is aware

## 2017-01-24 NOTE — Telephone Encounter (Signed)
Needed verbal order for Speech Therapy evaluation. PT will be 1 time a week for 8 weeks. Needs a call back to [place] to verify, [phone]

## 2017-01-24 NOTE — Telephone Encounter (Signed)
OK to do this

## 2017-02-02 ENCOUNTER — Encounter: Payer: 59 | Admitting: Pharmacist Clinician (PhC)/ Clinical Pharmacy Specialist

## 2017-02-05 ENCOUNTER — Telehealth: Payer: Self-pay

## 2017-02-05 NOTE — Telephone Encounter (Signed)
Requesting verbal order to see him once a week for four weeks to address his memory. Alonna Minium, [facility], [phone]. Please advise

## 2017-02-05 NOTE — Telephone Encounter (Signed)
This is fine 

## 2017-02-05 NOTE — Telephone Encounter (Signed)
Ebony Hail is aware

## 2017-02-09 ENCOUNTER — Ambulatory Visit: Payer: 59 | Admitting: Internal Medicine

## 2017-02-16 ENCOUNTER — Encounter: Payer: Self-pay | Admitting: Internal Medicine

## 2017-02-16 ENCOUNTER — Ambulatory Visit (INDEPENDENT_AMBULATORY_CARE_PROVIDER_SITE_OTHER): Payer: 59 | Admitting: Internal Medicine

## 2017-02-16 VITALS — BP 140/60 | HR 60 | Temp 98.7°F | Wt 126.0 lb

## 2017-02-16 DIAGNOSIS — R296 Repeated falls: Secondary | ICD-10-CM | POA: Diagnosis not present

## 2017-02-16 DIAGNOSIS — Z23 Encounter for immunization: Secondary | ICD-10-CM

## 2017-02-16 DIAGNOSIS — E782 Mixed hyperlipidemia: Secondary | ICD-10-CM

## 2017-02-16 DIAGNOSIS — R634 Abnormal weight loss: Secondary | ICD-10-CM

## 2017-02-16 DIAGNOSIS — N401 Enlarged prostate with lower urinary tract symptoms: Secondary | ICD-10-CM | POA: Diagnosis not present

## 2017-02-16 DIAGNOSIS — E039 Hypothyroidism, unspecified: Secondary | ICD-10-CM

## 2017-02-16 DIAGNOSIS — R600 Localized edema: Secondary | ICD-10-CM

## 2017-02-16 DIAGNOSIS — M25562 Pain in left knee: Secondary | ICD-10-CM

## 2017-02-16 DIAGNOSIS — Z7901 Long term (current) use of anticoagulants: Secondary | ICD-10-CM

## 2017-02-16 DIAGNOSIS — M25561 Pain in right knee: Secondary | ICD-10-CM | POA: Diagnosis not present

## 2017-02-16 DIAGNOSIS — I10 Essential (primary) hypertension: Secondary | ICD-10-CM | POA: Diagnosis not present

## 2017-02-16 MED ORDER — PREDNISONE 5 MG PO TABS: 5.0000 mg | ORAL_TABLET | Freq: Every day | ORAL | 2 refills | Status: DC

## 2017-02-16 NOTE — Patient Instructions (Signed)
Flu vaccine given today. Start prednisone 5 mg daily to see if this will help with pain and appetite. Has appointment October 25 with [facility]. Continue home PT.

## 2017-02-16 NOTE — Progress Notes (Signed)
   Subjective:    Patient ID: Jay Walters, male    DOB: 07-May-1932, 83 y.o.   MRN: [id number]  HPI  Pt staying in small bedroom at home. Discussed a motorized wheelchair with him. He feels he can't get around with it because of  close spaces at home. Have ordered home PT recently. Depenedent edema has improved. Only eats 2 meals a day. Check  Prealbumin today. Weight is down 4 pounds today.   He does not have a device to wear around his neck to call for help if he falls.  His son stays at home but sometimes doesn't get up until late morning to fix his breakfast. Wife works 2 jobs.  He says he doesn't want to be a burden to anybody. He enjoys watching TV. When he gets tired he takes a nap. He is alert and cooperative.       Review of Systems see above-he says he takes all of his medication that is laid out for him religiously. He doesn't really understand ways taking. Went through some of that with him today.     Objective:   Physical Exam Skin warm and dry. Chest clear. Cardiac exam regular rate and rhythm today extremities 1+ pitting edema of the lower extremities but I think it's improved       Assessment & Plan:  Weight loss  Dependent edema  Musculoskeletal pain-try prednisone 5 mg daily. Not only will this help his appetite and may help his chronic pain  He has appointment for chronic pain management October 25. His sister is with him today and I reminded her of this appointment and gave her a copy of the appointment time. His wife is also aware of the appointment. We will continue with home PT. It would be good if he could have some part-time help at home. I will see him again in 2 months.

## 2017-02-19 LAB — PREALBUMIN: PREALBUMIN: 28 mg/dL (ref 21–43)

## 2017-02-19 LAB — CBC WITH DIFFERENTIAL/PLATELET
BASOS PCT: 1.2 %
Basophils Absolute: 49 cells/uL (ref 0–200)
Eosinophils Absolute: 78 cells/uL (ref 15–500)
Eosinophils Relative: 1.9 %
HCT: 38.1 % — ABNORMAL LOW (ref 38.5–50.0)
Hemoglobin: 12.8 g/dL — ABNORMAL LOW (ref 13.2–17.1)
Lymphs Abs: 1771 cells/uL (ref 850–3900)
MCH: 30.6 pg (ref 27.0–33.0)
MCHC: 33.6 g/dL (ref 32.0–36.0)
MCV: 91.1 fL (ref 80.0–100.0)
MPV: 11 fL (ref 7.5–12.5)
Monocytes Relative: 9.2 %
NEUTROS PCT: 44.5 %
Neutro Abs: 1825 cells/uL (ref 1500–7800)
PLATELETS: 156 10*3/uL (ref 140–400)
RBC: 4.18 10*6/uL — AB (ref 4.20–5.80)
RDW: 13.1 % (ref 11.0–15.0)
TOTAL LYMPHOCYTE: 43.2 %
WBC mixed population: 377 cells/uL (ref 200–950)
WBC: 4.1 10*3/uL (ref 3.8–10.8)

## 2017-02-19 LAB — BASIC METABOLIC PANEL WITH GFR
BUN: 13 mg/dL (ref 7–25)
CALCIUM: 9 mg/dL (ref 8.6–10.3)
CHLORIDE: 108 mmol/L (ref 98–110)
CO2: 27 mmol/L (ref 20–32)
Creat: 0.76 mg/dL (ref 0.70–1.11)
GFR, Est African American: 98 mL/min/{1.73_m2} (ref >=60)
GFR, Est Non African American: 84 mL/min/{1.73_m2} (ref >=60)
GLUCOSE: 116 mg/dL — AB (ref 65–99)
POTASSIUM: 3.6 mmol/L (ref 3.5–5.3)
SODIUM: 141 mmol/L (ref 135–146)

## 2017-02-19 LAB — TSH: TSH: 3.36 m[IU]/L (ref 0.40–4.50)

## 2017-03-04 ENCOUNTER — Other Ambulatory Visit: Payer: Self-pay | Admitting: Internal Medicine

## 2017-03-06 NOTE — Telephone Encounter (Signed)
Overdue INR.   Unable to reach at [phone] or [phone].   Appt made for 03/08/2017.

## 2017-03-07 ENCOUNTER — Telehealth: Payer: Self-pay

## 2017-03-07 DIAGNOSIS — M25561 Pain in right knee: Secondary | ICD-10-CM

## 2017-03-07 DIAGNOSIS — W19XXXA Unspecified fall, initial encounter: Secondary | ICD-10-CM

## 2017-03-07 NOTE — Telephone Encounter (Signed)
Unable to LVM mailbox is full

## 2017-03-07 NOTE — Telephone Encounter (Signed)
LM for wife to call office back

## 2017-03-07 NOTE — Telephone Encounter (Signed)
Jay Walters with [facility] called to tell us that the pt fell 2 days ago. Fell on his right knee with a little swelling but not warm to the touch and no bruising, and is at a 7 on the scale of 1-10 at rest and a 10 when using. Will do short walking exercises and sitting movements for PT today. Pt has taken his aleve and tramadol but that only takes the edge off he states. Also increased urge to urinate, no burning or odor, but the output has decreased. Pt can not come in on Friday but may could get a ride tomorrow here for an appt. Please advise

## 2017-03-07 NOTE — Telephone Encounter (Signed)
LMOM; patient to call back

## 2017-03-07 NOTE — Telephone Encounter (Signed)
Called number listed as home, wifes cell, and wifes work number and have been unable to contact anyone

## 2017-03-07 NOTE — Telephone Encounter (Signed)
Get knee Xray in advance of appt. May be able to work in Friday. May need to go to ED if worse before then.

## 2017-03-08 NOTE — Telephone Encounter (Signed)
Wife stated that tomorrow does not work for her so she will bring him Monday and he will have xray first.

## 2017-03-12 ENCOUNTER — Ambulatory Visit: Payer: 59 | Admitting: Internal Medicine

## 2017-04-04 DIAGNOSIS — G40909 Epilepsy, unspecified, not intractable, without status epilepticus: Secondary | ICD-10-CM | POA: Diagnosis not present

## 2017-04-04 DIAGNOSIS — R296 Repeated falls: Secondary | ICD-10-CM | POA: Diagnosis not present

## 2017-04-04 DIAGNOSIS — M4802 Spinal stenosis, cervical region: Secondary | ICD-10-CM | POA: Diagnosis not present

## 2017-04-20 ENCOUNTER — Ambulatory Visit: Payer: 59 | Admitting: Internal Medicine

## 2017-05-11 ENCOUNTER — Ambulatory Visit (INDEPENDENT_AMBULATORY_CARE_PROVIDER_SITE_OTHER): Payer: 59 | Admitting: Internal Medicine

## 2017-05-11 ENCOUNTER — Encounter: Payer: Self-pay | Admitting: Internal Medicine

## 2017-05-11 VITALS — BP 128/60 | HR 60 | Ht 68.0 in | Wt 150.0 lb

## 2017-05-11 DIAGNOSIS — E039 Hypothyroidism, unspecified: Secondary | ICD-10-CM | POA: Diagnosis not present

## 2017-05-11 DIAGNOSIS — R296 Repeated falls: Secondary | ICD-10-CM | POA: Diagnosis not present

## 2017-05-11 DIAGNOSIS — I10 Essential (primary) hypertension: Secondary | ICD-10-CM

## 2017-05-11 DIAGNOSIS — Z8669 Personal history of other diseases of the nervous system and sense organs: Secondary | ICD-10-CM | POA: Diagnosis not present

## 2017-05-11 DIAGNOSIS — R7302 Impaired glucose tolerance (oral): Secondary | ICD-10-CM | POA: Diagnosis not present

## 2017-05-11 DIAGNOSIS — M25561 Pain in right knee: Secondary | ICD-10-CM | POA: Diagnosis not present

## 2017-05-11 DIAGNOSIS — Z8679 Personal history of other diseases of the circulatory system: Secondary | ICD-10-CM

## 2017-05-11 DIAGNOSIS — N401 Enlarged prostate with lower urinary tract symptoms: Secondary | ICD-10-CM

## 2017-05-11 DIAGNOSIS — Z96651 Presence of right artificial knee joint: Secondary | ICD-10-CM

## 2017-05-11 DIAGNOSIS — Z7901 Long term (current) use of anticoagulants: Secondary | ICD-10-CM | POA: Diagnosis not present

## 2017-05-11 NOTE — Progress Notes (Signed)
   Subjective:    Patient ID: Jay Walters, male    DOB: 08-15-1931, 83 y.o.   MRN: [id number]  HPI 83 year old Male in today with his wife for follow-up on orthopedic issues and pain management.  Also for follow-up on his weight. In September he weighed 126 pounds.  Now weighs 141-1/2 pounds.  His physical therapy at home has ended.  Wife would like it restarted but I am not sure that is available.  I think he is ambulating fairly well although he pushes his walker bent forward.  He tends to drag his right foot a bit.  Says his knees still hurt a lot.  He saw Dr. Hardin Negus at pain management and was given Tylenol with codeine.  That has helped some.  I think he might would benefit from a rehab consult and we will try to work on diet in the near future.  His affect is brighter.   Review of Systems     Objective:   Physical Exam Chest clear.  Cardiac exam: regular rate and rhythm normal S1 and S2.       Assessment & Plan:  Wife wants medications refilled to mail order pharmacy.  Flu vaccine given.  Return in 3 months for follow-up.  Try to arrange for rehab consult with physiatrist.  25 minutes spent with patient and his wife today

## 2017-07-17 ENCOUNTER — Ambulatory Visit (INDEPENDENT_AMBULATORY_CARE_PROVIDER_SITE_OTHER): Payer: 59 | Admitting: Pharmacist Clinician (PhC)/ Clinical Pharmacy Specialist

## 2017-07-17 ENCOUNTER — Encounter: Payer: Self-pay | Admitting: Physician Assistant

## 2017-07-17 ENCOUNTER — Ambulatory Visit: Payer: 59 | Admitting: Physician Assistant

## 2017-07-17 VITALS — BP 178/79 | HR 61 | Ht 69.0 in | Wt 142.0 lb

## 2017-07-17 DIAGNOSIS — I639 Cerebral infarction, unspecified: Secondary | ICD-10-CM | POA: Diagnosis not present

## 2017-07-17 DIAGNOSIS — I4892 Unspecified atrial flutter: Secondary | ICD-10-CM

## 2017-07-17 DIAGNOSIS — Z8774 Personal history of (corrected) congenital malformations of heart and circulatory system: Secondary | ICD-10-CM

## 2017-07-17 DIAGNOSIS — I10 Essential (primary) hypertension: Secondary | ICD-10-CM

## 2017-07-17 DIAGNOSIS — Z79899 Other long term (current) drug therapy: Secondary | ICD-10-CM | POA: Diagnosis not present

## 2017-07-17 DIAGNOSIS — I5032 Chronic diastolic (congestive) heart failure: Secondary | ICD-10-CM

## 2017-07-17 DIAGNOSIS — Z7901 Long term (current) use of anticoagulants: Secondary | ICD-10-CM | POA: Diagnosis not present

## 2017-07-17 DIAGNOSIS — E782 Mixed hyperlipidemia: Secondary | ICD-10-CM | POA: Diagnosis not present

## 2017-07-17 LAB — POCT INR: INR: 1.6

## 2017-07-17 MED ORDER — FUROSEMIDE 40 MG PO TABS: 40.0000 mg | ORAL_TABLET | ORAL | 1 refills | Status: DC | PRN

## 2017-07-17 NOTE — Progress Notes (Signed)
Cardiology Office Note:    Date:  07/17/2017   ID:  Jay Walters, DOB [birth year], MRN [id number]  PCP:  Elby Showers, MD  Cardiologist:  Pixie Casino, MD   Referring MD: Elby Showers, MD   Chief Complaint  Patient presents with  . Follow-up    atrial flutter    History of Present Illness:    Jay Walters is a 84 y.o. male with a hx of typical atrial flutter s/p ablation 07/28/11, hx of ASD repair, on chronic coumadin, hx of stroke with residual left-sided weakness, GERD, HTN, HLD, and SSS. He last saw Dr. Debara Pickett on 05/02/16. A that time, he was having bilateral knee pain and was referred to orthopedics. No medications were made at that time, aside for adjustments in his coumadin dose per pharmacy. He was then sent to pain management and home PT ordered.  Since then, he fell in 02/2017 on both knees. He has been home-bound since falling as his mobility was severely decreased.   Last INR in Epic 12/21/16.  INR was checked with pharmacy today and was 1.6.  He presents today for routine follow up with his wife.  He has had mobility problems over the past several months, he cannot drive, and his wife works 2 jobs.  It is difficult for them to make appointments.  This is why he has not had an INR check in over 6 months.  He reports falling quite frequently.  A fall generally consists of him using his walker to get around the house and his knees giving way.  He generally falls to the ground on both knees.  He has not hit his head recently.  He reports falling 3 times in the past month.  He reports falling 1-3 times every month for the past year.  He denies loss of consciousness and presyncope.  He has continued his Coumadin without INR check.  EKG today was normal sinus rhythm with first degree heart block.  I had a long discussion with the patient and his wife about ongoing anticoagulation.  He cannot go on a NOAC while on Dilantin.  I discussed his high risk of stroke with a chads vas score of  5.  However given his frequent falls and difficulty making it to INR checks, I question his need for ongoing anticoagulation. I will discuss this with Dr. Debara Pickett.  The patient and his wife also reports that he has chronic lower extremity pain.  They question his need for Lipitor.  I agreed to a trial period of discontinuation of Lipitor to see if this improves his lower extremity pain.  Labs collected today and will be collected in follow-up in 6-8 weeks.  Aside from his frequent falls and mobility issues, he is doing quite well from a cardiac standpoint.  He controls his lower extremity swelling with PRN Lasix, which is managed by his PCP.  He denies chest pain shortness of breath, dyspnea on exertion, orthopnea, and syncope.    Past Medical History:  Diagnosis Date  . Anemia   . Arthritis    "right hip; right knee; lower back ~ 1/2 way across"  . Atrial fibrillation ([place])    remotely   . Atrial flutter ([place])    typical appearing by ekg. s/p ablation 07/28/11  . Diverticulosis   . Dysphagia    with solids  . Esophageal stricture   . GERD (gastroesophageal reflux disease)   . H/O hiatal hernia   . Heart murmur   .  Hemorrhoids   . High cholesterol   . Hypertension   . Osteopenia   . Seizures ([place])    "back w/migraine headaches; 1990's or before"  . Sinus bradycardia    asymptomatic  . SSS (sick sinus syndrome) ([place])   . Stroke [facility]) 2012   "left me weaker on left side; balance is not good"    Past Surgical History:  Procedure Laterality Date  . ASD REPAIR  1972   Patch repair  . ATRIAL FLUTTER ABLATION N/A 07/28/2011   Procedure: ATRIAL FLUTTER ABLATION;  Surgeon: Thompson Grayer, MD;  Location: [facility] CATH LAB;  Service: Cardiovascular;  Laterality: N/A;  . CARDIAC ELECTROPHYSIOLOGY STUDY AND ABLATION  07/28/11  . CARDIOVASCULAR STRESS TEST  03/07/2010   Perfusion defect in the inferior myocardial region is consistent with diaphragmatic attenuation. No ischemia or infarct/scar is seen  in the remaining myocardium. No ECG changes.  . CAROTID DOPPLER  06/26/2011   Bilateral ICAs-demonstrated normal patency without eivdence of significant diameter reduction, dissection, or any other vascular abnormality.  . INGUINAL HERNIA REPAIR  2011   right  . JOINT REPLACEMENT     right knee replacement  . KNEE ARTHROSCOPY     right; "maybe twice"  . TOTAL KNEE ARTHROPLASTY  07/10/2012   Procedure: TOTAL KNEE ARTHROPLASTY;  Surgeon: Tobi Bastos, MD;  Location: [facility] ORS;  Service: Orthopedics;  Laterality: Right;  . TRANSTHORACIC ECHOCARDIOGRAM  12/10/2012   EF 55-60%. No regional wall motion abnormalities. LA moderately dialted.Mild-moderate regurg of the tricuspid valve.    Current Medications: Current Meds  Medication Sig  . amitriptyline (ELAVIL) 10 MG tablet Take 1 tablet (10 mg total) by mouth at bedtime.  Marland Kitchen BESIVANCE 0.6 % SUSP   . bimatoprost (LUMIGAN) 0.03 % ophthalmic solution Place 1 drop into both eyes at bedtime. For 3 weeks, then return for a check up. Starting on 03/13/2014.  Marland Kitchen doxazosin (CARDURA) 4 MG tablet TAKE 1 TABLET DAILY  . DUREZOL 0.05 % EMUL   . ferrous sulfate 325 (65 FE) MG tablet Take 325 mg by mouth 2 (two) times daily after a meal.   . finasteride (PROSCAR) 5 MG tablet Take 1 tablet (5 mg total) by mouth daily.  . furosemide (LASIX) 20 MG tablet Take 1 tablet (20 mg total) by mouth daily.  . furosemide (LASIX) 40 MG tablet Take 1 tablet (40 mg total) by mouth as needed.  . gabapentin (NEURONTIN) 300 MG capsule Take 300 mg by mouth 2 (two) times daily.  Marland Kitchen levothyroxine (SYNTHROID, LEVOTHROID) 50 MCG tablet Take 1 tablet (50 mcg total) by mouth daily.  . Multiple Vitamin (MULTIVITAMIN WITH MINERALS) TABS tablet Take 1 tablet by mouth daily.  . naproxen sodium (ALEVE) 220 MG tablet Take 220 mg by mouth daily as needed (arthritis pain).  . phenytoin (DILANTIN) 100 MG ER capsule TAKE 3 CAPSULES AT BEDTIME  . Polyethyl Glycol-Propyl Glycol (SYSTANE ULTRA)  0.4-0.3 % SOLN Place 1 drop into both eyes daily.  . predniSONE (DELTASONE) 5 MG tablet Take 1 tablet (5 mg total) by mouth daily with breakfast.  . PROLENSA 0.07 % SOLN   . tamsulosin (FLOMAX) 0.4 MG CAPS capsule Take 1 capsule (0.4 mg total) by mouth 2 (two) times daily.  . traMADol (ULTRAM) 50 MG tablet Take 1 tablet (50 mg total) by mouth every 8 (eight) hours as needed.  . TRAVATAN Z 0.004 % SOLN ophthalmic solution Place 1 drop into both eyes daily.  Marland Kitchen warfarin (COUMADIN) 5 MG tablet Take  1/2 to 1 tablet daily as directed by coumadin clinic. NEED INR appointment prior to next refill authorization.  . [DISCONTINUED] atorvastatin (LIPITOR) 20 MG tablet Take 1 tablet (20 mg total) by mouth daily.  . [DISCONTINUED] furosemide (LASIX) 40 MG tablet Take 1 tablet (40 mg total) by mouth daily.     Allergies:   Patient has no known allergies.   Social History   Socioeconomic History  . Marital status: Married    Spouse name: None  . Number of children: 2  . Years of education: None  . Highest education level: None  Social Needs  . Financial resource strain: None  . Food insecurity - worry: None  . Food insecurity - inability: None  . Transportation needs - medical: None  . Transportation needs - non-medical: None  Occupational History  . Occupation: retired but still works Programmer, applications: [employer]  Tobacco Use  . Smoking status: Never Smoker  . Smokeless tobacco: Never Used  Substance and Sexual Activity  . Alcohol use: Yes    Alcohol/week: 0.0 oz    Comment: 07/28/11 "1/5 will last me a year; w/company"  . Drug use: No  . Sexual activity: Not Currently  Other Topics Concern  . None  Social History Narrative  . None     Family History: The patient's family history includes Cancer in his sister; Heart disease in his father.  ROS:   Please see the history of present illness.    All other systems reviewed and are negative.  EKGs/Labs/Other Studies  Reviewed:    The following studies were reviewed today:  Echo 03/23/14 Study Conclusions - Left ventricle: The cavity size was normal. There was moderate concentric hypertrophy. Systolic function was normal. The estimated ejection fraction was in the range of 55% to 60%. Doppler parameters are consistent with abnormal left ventricular relaxation (grade 1 diastolic dysfunction). The E/e&' ratio is <8, suggesting normal LV filling pressure. - Aortic valve: Trileaflet. Sclerosis without stenosis. There was mild regurgitation. - Mitral valve: Mildly thickened leaflets . There was mild regurgitation. - Left atrium: The atrium was mildly dilated. - Right atrium: Severely dilated (29 cm2). - Atrial septum: Aneurysmal IAS - PFO cannot be excluded. - Tricuspid valve: There was trivial regurgitation. - Pulmonary arteries: PA peak pressure: 18 mm Hg (S). - Inferior vena cava: The vessel was normal in size. The respirophasic diameter changes were in the normal range (>= 50%), consistent with normal central venous pressure.  Impressions: - Compared to the prior echo in 2014, the RA is now severely dilated. There is an aneurysmal IAS with possible PFO.   EKG:  EKG is ordered today.  The ekg ordered today demonstrates   Recent Labs: 12/01/2016: ALT 19 02/16/2017: BUN 13; Creat 0.76; Hemoglobin 12.8; Platelets 156; Potassium 3.6; Sodium 141; TSH 3.36  Recent Lipid Panel    Component Value Date/Time   CHOL 165 12/01/2016 1105   TRIG 96 12/01/2016 1105   HDL 53 12/01/2016 1105   CHOLHDL 3.1 12/01/2016 1105   VLDL 19 12/01/2016 1105   LDLCALC 93 12/01/2016 1105    Physical Exam:    VS:  BP (!) 178/79   Pulse 61   Ht 5\' 9"  (1.753 m)   Wt 142 lb (64.4 kg)   BMI 20.97 kg/m     Repeat BP 150/78   Wt Readings from Last 3 Encounters:  07/17/17 142 lb (64.4 kg)  05/11/17 150 lb (68 kg)  02/16/17 126  lb (57.2 kg)     GEN: frail, elderly male in no acute distress,  walks with rolling walker HEENT: Normal NECK: No JVD; No carotid bruits LYMPHATICS: No lymphadenopathy CARDIAC: RRR, no murmurs, rubs, gallops RESPIRATORY:  Clear to auscultation without rales, wheezing or rhonchi  ABDOMEN: Soft, non-tender, non-distended MUSCULOSKELETAL:  + edema B LE; No deformity. 1+ bilateral pedal pulses SKIN: Warm and dry NEUROLOGIC:  Alert and oriented x 3 PSYCHIATRIC:  Normal affect   ASSESSMENT:    1. Atrial flutter, unspecified type ([place])   2. Hypertension, unspecified type   3. Mixed hyperlipidemia   4. Cerebrovascular accident (CVA), unspecified mechanism ([place])   5. H/O congenital atrial septal defect (ASD) repair   6. Chronic anticoagulation   7. Chronic diastolic heart failure ([place])   8. Medication management    PLAN:    In order of problems listed above:  1.  Atrial flutter s/p ablation 07/28/11, maintaining sinus rhythm with first degree heart block This patients CHA2DS2-VASc Score and unadjusted Ischemic Stroke Rate (% per year) is equal to 7.2 % stroke rate/year from a score of 5 (stroke, HTN, age).  Continue Coumadin as below for now.  INR today was 1.6.  In consultation with pharmacy, he is not eligible for an NOAC because he is on Dilantin.  I will discussed with Dr. Debara Pickett the need for ongoing anticoagulation given his frequent falls and difficulty making it to INR checks.   2.  Hypertension Pressure today was high, improved on recheck. His pressure was normal 05/11/17. He is on Flomax and Proscar, but no other hypertensive medications. Given his frequent falls, I am hesitant to add hypertension medication for an isolated high pressure. Patient was asked to log pressures. Will follow up pressure at next appt in 6-8 weeks.    3. HLD He is on 20 mg of Lipitor. 12/01/2016: Cholesterol 165; HDL 53; LDL Cholesterol 93; Triglycerides 96; VLDL 19 Given his lower extremity pain, will collect fasting lipid panel today along with LFTs.  Will discontinue  Lipitor today for a trial to see if this improves his lower extremity pain.  Plan to repeat labs on follow-up in 6-8 weeks.   4. Prior stroke, back pain, knee pain He was working with home physical therapy for weakness, back pain, and knee pain.  Unfortunately this pain limits his mobility.     5. Chronic anticoagulation on warfarin Last INR check was 12/21/2016.  INR was 1.8. INR today was 1.6. He has frequent falls in which he tries to walk with his walker and falls on both knees.  He denies bleeding and bruising. No visible ecchymosis on exam. He hasn't left the house and has not had his INR checked in over 3 months. He can't drive and his wife works two jobs, making frequent visits for INR checks difficult.  He is not a candidate for NOAC due to dilantin.  Will discuss with Dr. Debara Pickett has need for ongoing anticoagulation.  According to a note by Dr. Lovena Le in 2014, he recommended Coumadin therapy following ablation because of evidence of atrial fibrillation isolated from his atrial flutter.   6. History of atrial septal defect s/p patch repair Stable   7.  Chronic diastolic heart failure Echo in 2015 with grade 1 diastolic dysfunction.  He had normal LV ejection fraction at that time: 55-60%.  He is on a diuretic regimen at home of 40 mg lasix that he uses PRN for leg swelling. This has been managed by his  PCP Dr. Renold Genta. He denies DOE and orthopnea.    Medication Adjustments/Labs and Tests Ordered: Current medicines are reviewed at length with the patient today.  Concerns regarding medicines are outlined above.  Orders Placed This Encounter  Procedures  . Comprehensive metabolic panel  . CBC  . Lipid panel  . Lipid panel  . EKG 12-Lead   Meds ordered this encounter  Medications  . furosemide (LASIX) 40 MG tablet    Sig: Take 1 tablet (40 mg total) by mouth as needed.    Dispense:  30 tablet    Refill:  1    Signed, Ledora Bottcher, [place]  07/17/2017 4:33 PM    [facility]

## 2017-07-17 NOTE — Patient Instructions (Addendum)
Medication Instructions: STOP the Atorvastatin  If you need a refill on your cardiac medications before your next appointment, please call your pharmacy.   Labwork: Your provider would like for you to have the following labs today: CBC, Lipid, and a CMET.  Your provider would like for you to return in 6-8 weeks to have the following labs  FASTING Lipid. You do not need an appointment for the lab. Once in our office lobby there is a podium where you can sign in and ring the doorbell to alert [place] that you are here. The lab is open from 8:00 am to 4:00 pm; closed for lunch from 12:45pm-1:45pm.   Follow-Up: Your physician wants you to follow-up in: 6-8 weeks with Dr. Debara Pickett. You will receive a reminder letter in the mail two months in advance. If you don't receive a letter, please call our office at [phone] to schedule this follow-up appointment.   Special Instructions:  Please a log of your blood pressures and bring to your next appointment.   Thank you for choosing [facility] at [facility]!!

## 2017-07-17 NOTE — Patient Instructions (Signed)
Description   Take 1.5 tablets today Tuesday Feb 5, then continue with 1 tablet daily except 1/2 tablet each Sunday, Tuesday and Thursday.  Repeat INR in 2 weeks.

## 2017-07-18 LAB — LIPID PANEL
CHOLESTEROL TOTAL: 171 mg/dL (ref 100–199)
Chol/HDL Ratio: 3.4 ratio (ref 0.0–5.0)
HDL: 50 mg/dL (ref >39)
LDL CALC: 105 mg/dL — AB (ref 0–99)
TRIGLYCERIDES: 79 mg/dL (ref 0–149)
VLDL CHOLESTEROL CAL: 16 mg/dL (ref 5–40)

## 2017-07-18 LAB — COMPREHENSIVE METABOLIC PANEL
A/G RATIO: 1.3 (ref 1.2–2.2)
ALK PHOS: 71 IU/L (ref 39–117)
ALT: 18 IU/L (ref 0–44)
AST: 18 IU/L (ref 0–40)
Albumin: 4.1 g/dL (ref 3.5–4.7)
BUN/Creatinine Ratio: 23 (ref 10–24)
BUN: 20 mg/dL (ref 8–27)
CALCIUM: 9.3 mg/dL (ref 8.6–10.2)
CHLORIDE: 106 mmol/L (ref 96–106)
CO2: 23 mmol/L (ref 20–29)
Creatinine, Ser: 0.86 mg/dL (ref 0.76–1.27)
GFR calc Af Amer: 93 mL/min/{1.73_m2} (ref >59)
GFR, EST NON AFRICAN AMERICAN: 80 mL/min/{1.73_m2} (ref >59)
Globulin, Total: 3.2 g/dL (ref 1.5–4.5)
Glucose: 93 mg/dL (ref 65–99)
POTASSIUM: 4.5 mmol/L (ref 3.5–5.2)
Sodium: 143 mmol/L (ref 134–144)
Total Protein: 7.3 g/dL (ref 6.0–8.5)

## 2017-07-18 LAB — CBC
HEMOGLOBIN: 13.7 g/dL (ref 13.0–17.7)
Hematocrit: 40.6 % (ref 37.5–51.0)
MCH: 31.3 pg (ref 26.6–33.0)
MCHC: 33.7 g/dL (ref 31.5–35.7)
MCV: 93 fL (ref 79–97)
PLATELETS: 214 10*3/uL (ref 150–379)
RBC: 4.38 x10E6/uL (ref 4.14–5.80)
RDW: 14.4 % (ref 12.3–15.4)
WBC: 5.5 10*3/uL (ref 3.4–10.8)

## 2017-07-19 ENCOUNTER — Telehealth: Payer: Self-pay | Admitting: *Deleted

## 2017-07-19 DIAGNOSIS — Z79899 Other long term (current) drug therapy: Secondary | ICD-10-CM

## 2017-07-19 NOTE — Addendum Note (Signed)
Addended by: Gaetano Net on: 07/19/2017 10:17 AM   Modules accepted: Orders

## 2017-07-19 NOTE — Telephone Encounter (Signed)
-----   Message from Ledora Bottcher, [place] sent at 07/18/2017  8:14 AM EST ----- Your labs look good. Your LDL/bad cholesterol is only slightly elevated. Continue holding lipitor and monitor leg pain. We will repeat labs in 6-8 weeks when you follow up with [place].

## 2017-08-17 ENCOUNTER — Telehealth: Payer: Self-pay | Admitting: Physician Assistant

## 2017-08-17 NOTE — Telephone Encounter (Signed)
New Message   Patient is requesting a call back from the coumadin clinic. He states that he was taken off coumadin and put on something else. Yet he does not recall what. He wants to know does he still have to have his INR check. Please call to discuss.

## 2017-08-17 NOTE — Telephone Encounter (Signed)
LMOM; no answer at this time. Patient to call back today or I will try to return call again on Monday.  Atorvastatin was discontinued at most recent office visit but warfarin continued as previously prescribed.  Patient not a candidate for NOACs due to chronic use of dilantin.

## 2017-08-20 NOTE — Telephone Encounter (Signed)
LMOM; Mrs Modesitt to call back and discuss warfarin dose nd further follow up.

## 2017-08-21 ENCOUNTER — Other Ambulatory Visit: Payer: Self-pay | Admitting: Pharmacist Clinician (PhC)/ Clinical Pharmacy Specialist

## 2017-08-21 MED ORDER — WARFARIN SODIUM 5 MG PO TABS: ORAL_TABLET | ORAL | 0 refills | Status: DC

## 2017-08-21 NOTE — Telephone Encounter (Signed)
Next INR schedule for 3/22 - patient still taking warfarin

## 2017-08-31 ENCOUNTER — Ambulatory Visit (INDEPENDENT_AMBULATORY_CARE_PROVIDER_SITE_OTHER): Payer: 59 | Admitting: Pharmacist Clinician (PhC)/ Clinical Pharmacy Specialist

## 2017-08-31 DIAGNOSIS — I4892 Unspecified atrial flutter: Secondary | ICD-10-CM

## 2017-08-31 DIAGNOSIS — Z7901 Long term (current) use of anticoagulants: Secondary | ICD-10-CM

## 2017-08-31 LAB — POCT INR: INR: 1.3

## 2017-08-31 NOTE — Patient Instructions (Signed)
Description   Take 2 tablets Friday March 22 and Saturday March 23, then take 1 tablet daily except 1/2 tablet each Sunday, Tuesday and Thursday.  Repeat INR in 3 weeks

## 2017-10-03 ENCOUNTER — Ambulatory Visit: Payer: 59 | Admitting: Internal Medicine

## 2017-10-16 ENCOUNTER — Ambulatory Visit: Payer: 59 | Admitting: Internal Medicine

## 2017-10-23 ENCOUNTER — Ambulatory Visit: Payer: 59 | Admitting: Internal Medicine

## 2017-10-25 ENCOUNTER — Ambulatory Visit (INDEPENDENT_AMBULATORY_CARE_PROVIDER_SITE_OTHER): Payer: 59 | Admitting: Internal Medicine

## 2017-10-25 VITALS — BP 140/80 | HR 54 | Temp 97.9°F | Wt 148.0 lb

## 2017-10-25 DIAGNOSIS — N401 Enlarged prostate with lower urinary tract symptoms: Secondary | ICD-10-CM

## 2017-10-25 DIAGNOSIS — N492 Inflammatory disorders of scrotum: Secondary | ICD-10-CM

## 2017-10-25 DIAGNOSIS — R609 Edema, unspecified: Secondary | ICD-10-CM

## 2017-10-25 DIAGNOSIS — Z7901 Long term (current) use of anticoagulants: Secondary | ICD-10-CM

## 2017-10-25 DIAGNOSIS — M25561 Pain in right knee: Secondary | ICD-10-CM

## 2017-10-25 DIAGNOSIS — R7302 Impaired glucose tolerance (oral): Secondary | ICD-10-CM | POA: Diagnosis not present

## 2017-10-25 DIAGNOSIS — E039 Hypothyroidism, unspecified: Secondary | ICD-10-CM

## 2017-10-25 DIAGNOSIS — Z8679 Personal history of other diseases of the circulatory system: Secondary | ICD-10-CM

## 2017-10-25 DIAGNOSIS — I10 Essential (primary) hypertension: Secondary | ICD-10-CM | POA: Diagnosis not present

## 2017-10-25 DIAGNOSIS — M25562 Pain in left knee: Secondary | ICD-10-CM

## 2017-10-25 DIAGNOSIS — Z8669 Personal history of other diseases of the nervous system and sense organs: Secondary | ICD-10-CM | POA: Diagnosis not present

## 2017-10-25 DIAGNOSIS — S30813A Abrasion of scrotum and testes, initial encounter: Secondary | ICD-10-CM | POA: Diagnosis not present

## 2017-10-25 DIAGNOSIS — I639 Cerebral infarction, unspecified: Secondary | ICD-10-CM

## 2017-10-25 LAB — BRAIN NATRIURETIC PEPTIDE: Brain Natriuretic Peptide: 65 pg/mL (ref <100)

## 2017-10-25 MED ORDER — CEFPROZIL 500 MG PO TABS: 500.0000 mg | ORAL_TABLET | Freq: Two times a day (BID) | ORAL | 0 refills | Status: DC

## 2017-10-25 MED ORDER — PREDNISONE 5 MG PO TABS: 5.0000 mg | ORAL_TABLET | Freq: Every day | ORAL | 2 refills | Status: DC

## 2017-10-25 MED ORDER — GABAPENTIN 300 MG PO CAPS: ORAL_CAPSULE | ORAL | 5 refills | Status: DC

## 2017-10-25 MED ORDER — HYDROCODONE-ACETAMINOPHEN 10-325 MG PO TABS: 1.0000 | ORAL_TABLET | Freq: Three times a day (TID) | ORAL | 0 refills | Status: DC | PRN

## 2017-10-25 MED ORDER — FUROSEMIDE 40 MG PO TABS: 40.0000 mg | ORAL_TABLET | ORAL | 1 refills | Status: DC | PRN

## 2017-10-25 NOTE — Patient Instructions (Addendum)
Hydrocodone APAP prescription written but denied.  Appeal sent to insurance company.  Make sure you take Furosemide 40 mg daily for leg swelling.. Labs drawn today.  Return to pain management with Dr. Hardin Negus.  He needs to be on chronic pain management.  We checked with [facility].  He had been advised to return in 1 month but never did.  Wife says she was not aware that an appointment for follow-up was advised.  Cefzil 500 mg twice daily for 7 days for cellulitis right scrotum.   His wife will be contacted about an appointment with [facility].  They are happy to recheck him. Wife says he does not do well with tramadol and gabapentin.

## 2017-10-25 NOTE — Progress Notes (Signed)
Subjective:    Patient ID: Jay Walters, male    DOB: 02/23/1932, 84 y.o.   MRN: [id number]  HPI Last seen November and weight was 150 and now is 148 pounds.  Wife says he was not really good at chronic pain management.  We called [facility] and they indicated that he did not return for follow-up of the supposed to go back in 4 weeks.  Somehow the most miscommunication.  They are agreeable to seeing him again.  His insurance would not approve his Norco today for chronic pain.  We had to file an appeal for that.  He clearly continues to have issues ambulating and appears to not be able to straighten his knees.  He ambulates with a walker.  He basically goes from his bed to the bathroom and back to the bed or chair.  No recent falls he says.  Since he has always stayed in good shape and played golf he has been able to remain mobile I think.  He is complaining of some irritation in his right scrotal area.  Appears to have aa small area of cellulitis for which Cefzil has been prescribed.  His feet are swollen I think due to dependent edema.  We did check BNP which was normal at 65.  B met was normal.  He remains on chronic anticoagulation for heart issues.  Orthopedist have not wanted to consider more surgery on his knees.  Wife says the tramadol made him confused as well as gabapentin so these apparently are not options.  This is the first time of her this.  Usually she does not come with him to the appointment.  He has glaucoma and he has BPH.  He has hypothyroidism and is on low-dose Synthroid.  We have tried Elavil at bedtime and that did not seem to help much either according to his wife.    Review of Systems we have recontatec [facility] who will reach out to him for an appointment.  His wife was contacted about this as well.     Objective:   Physical Exam Chest is clear.  Cardiac exam today regular rate and rhythm normal S1 and S2 2.  He has 1+ pitting edema of his  feet.  His mentation is normal.  I think he is mildly depressed due to his situation       Assessment & Plan:  Dependent edema-prescribed Lasix 40 mg daily.  He will need follow-up on potassium in 4 to 6 weeks  Infected abrasion of scrotum treated with Cefzil today  No evidence of congestive heart failure with normal BNP  Chronic knee pain-we have prescribed Norco 10/325 every 8 hours thinking he could break this in half but this prescription was denied.  They could pay out of pocket but was on exorbitant amount of money.  We sent in a written appeal with him for this pain medication which several days later was approved.  He apparently is intolerant of tramadol and gabapentin.  Wife says these make him loopy  History of seizure disorder maintained on Dilantin for many years with no recurrence  History of atrial flutter on chronic anticoagulation agraffe history of impaired glucose tolerance  BPH  Hypothyroidism  Plan: He will return in 6 months.  I expect his wife to take him back to [facility] for assessment.  40 minutes was spent with patient and 30 minutes was spent with writing his appeal to the insurance company for approval  of his pain medication.

## 2017-10-26 ENCOUNTER — Telehealth: Payer: Self-pay | Admitting: Internal Medicine

## 2017-10-26 LAB — BASIC METABOLIC PANEL
BUN: 19 mg/dL (ref 7–25)
CALCIUM: 9.6 mg/dL (ref 8.6–10.3)
CO2: 27 mmol/L (ref 20–32)
CREATININE: 0.9 mg/dL (ref 0.70–1.11)
Chloride: 105 mmol/L (ref 98–110)
GLUCOSE: 96 mg/dL (ref 65–99)
Potassium: 4.5 mmol/L (ref 3.5–5.3)
Sodium: 140 mmol/L (ref 135–146)

## 2017-10-26 MED ORDER — HYDROCODONE-ACETAMINOPHEN 10-325 MG PO TABS: 1.0000 | ORAL_TABLET | Freq: Three times a day (TID) | ORAL | 0 refills | Status: AC | PRN

## 2017-10-26 NOTE — Telephone Encounter (Signed)
LVM at [facility] to CB and let me know if patient has been seen and if they have been seen to please fax me office notes.

## 2017-10-26 NOTE — Telephone Encounter (Signed)
[address] [place] [phone]  Elster called back to say that his Eye Surgeon was Dr Clent Jacks and his eye doctor is Dr [place].

## 2017-10-29 ENCOUNTER — Telehealth: Payer: Self-pay | Admitting: Internal Medicine

## 2017-10-29 NOTE — Telephone Encounter (Signed)
Faxed [phone] and mailed  Clinical Appeals [place] [place] [address] a request for Clinical Appeal on patients HYDROcodone-acetaminophen ([place]) 10-325 MG tablet  Prescription. On 10/29/2017

## 2017-10-29 NOTE — Telephone Encounter (Signed)
LVM for Jay Walters to call [facility] [phone] and schedule an appointment.

## 2017-10-29 NOTE — Telephone Encounter (Signed)
Ryan from [facility] CB to say that Mr Kalmbach had been there  On 04/05/2017 and was supposed to come back in a month and he had not been back. I ask him to fax office notes from visit and I let him know that I would call patient and have them call back and schedule appointment. Phone [phone]

## 2017-10-31 ENCOUNTER — Telehealth: Payer: Self-pay | Admitting: Internal Medicine

## 2017-10-31 ENCOUNTER — Encounter: Payer: Self-pay | Admitting: Internal Medicine

## 2017-10-31 NOTE — Telephone Encounter (Signed)
Left message on wife's cell phone that [facility] had planned to see him a month after initial visit and would be happy to see him again. Insurance has approved Norco 10/325.

## 2017-10-31 NOTE — Telephone Encounter (Signed)
Received approval from [employer] for HYDROcodone-acetaminophen [facility]) 10-325 MG tablet  For 10/15/17 through 11/01/18

## 2017-11-01 ENCOUNTER — Encounter: Payer: Self-pay | Admitting: Internal Medicine

## 2017-11-01 ENCOUNTER — Ambulatory Visit (INDEPENDENT_AMBULATORY_CARE_PROVIDER_SITE_OTHER): Payer: 59 | Admitting: Pharmacist Clinician (PhC)/ Clinical Pharmacy Specialist

## 2017-11-01 ENCOUNTER — Ambulatory Visit: Payer: 59 | Admitting: Internal Medicine

## 2017-11-01 VITALS — BP 156/78 | HR 62 | Ht 69.0 in | Wt 148.0 lb

## 2017-11-01 DIAGNOSIS — E782 Mixed hyperlipidemia: Secondary | ICD-10-CM | POA: Diagnosis not present

## 2017-11-01 DIAGNOSIS — R600 Localized edema: Secondary | ICD-10-CM | POA: Diagnosis not present

## 2017-11-01 DIAGNOSIS — Z7901 Long term (current) use of anticoagulants: Secondary | ICD-10-CM | POA: Diagnosis not present

## 2017-11-01 DIAGNOSIS — I5032 Chronic diastolic (congestive) heart failure: Secondary | ICD-10-CM | POA: Diagnosis not present

## 2017-11-01 DIAGNOSIS — I4892 Unspecified atrial flutter: Secondary | ICD-10-CM

## 2017-11-01 LAB — POCT INR: INR: 1.7 — AB (ref 2.0–3.0)

## 2017-11-01 NOTE — Progress Notes (Signed)
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 He was previously followed by Dr. Rollene Fare. At that time, he was seen and was in atrial flutter and felt to be potentially a candidate for A-flutter ablation due to his bradycardia; however, the timing was such that it was recommended that this would be postponed until after he has recovered. He also has a history of a remote ASD repair and was on Coumadin in the past but has not been on that recently. He was restarted on Coumadin after there was time to allow for recovery from the stroke. He did have a CT angiogram of the neck which showed question of thrombus or atherosclerotic plaque at the carotid bifurcation and an ultrasound of the neck was repeated in our office on June 26, 2011, which demonstrated no significant carotid stenosis.  Mr. Weatherly returns today for follow-up. He's had some back injections for pain but he reports only last for about 1 day. He's also had problems with his right knee. This was replaced over year ago at [facility] and he reports instability and pain in that knee. He's had falls recently including a fall that caused a laceration above his left eyebrow which was sutured. His head was scanned and fortunately there was no intracranial bleeding on warfarin. In October he was in the hospital for what was thought to be a syncopal event however he feels that he fell due to tripping on the carpet. He denies any loss of consciousness. Heart rate was low in the 40s and he's had problems with bradycardia in the past. There is been some medication adjustments and he's felt pretty well since then. He was supposed to get a follow-up monitor but that was never arranged.  I saw Mr. Samples in the office today for follow-up. Overall he  feels well except he continues to have problems with the right knee. I sent him for a second opinion with Dr. [name] at [facility] orthopedics. He felt that he could do some cleaning up of the knee but could not "guarantee" Mr. Sayed that he be free of pain. Based on this, Jaydrien didn't think that additional surgery was a good idea. His INR today was 3.6 in the office and he was advised to hold his dose tonight and additional dose adjustments of warfarin will be made. He denies any recurrent atrial flutter. Heart rate today is stable in the 50s with a first-degree AV block, not uncommon after ablation. Blood pressure is at goal.  05/02/2016  Mr. Gallicchio returns today for follow-up. Overall he is doing fairly well. He continues to have problems with orthopedic issues particularly in the knees. He has lower extremity swelling which is due to venous insufficiency and wears compression stockings. He's not had any recurrent A. fib. Warfarin level was slightly low today and he will need adjustments per pharmacy. Blood pressure is borderline elevated but I will not change that today as he has a primary care follow-up in one week.  11/01/2017  Mr. Betzer returns for follow-up.  He has no specific complaints.  He continues to have lower extremity swelling.  In the past he is worn compression stockings but not recently.  I am not sure if he has any so we will provide him a prescription for that.  His wife brought him today however she works 2 jobs and is generally gone throughout the day after she leaves early in the morning.  Blood pressure was elevated slightly today.  PMHx:  Past Medical History:  Diagnosis Date  . Anemia   . Arthritis    "right hip; right knee; lower back ~ 1/2 way across"  . Atrial fibrillation ([place])    remotely   . Atrial flutter ([place])    typical appearing by ekg. s/p ablation 07/28/11  . Diverticulosis   . Dysphagia    with solids  . Esophageal stricture   . GERD  (gastroesophageal reflux disease)   . H/O hiatal hernia   . Heart murmur   . Hemorrhoids   . High cholesterol   . Hypertension   . Osteopenia   . Seizures ([place])    "back w/migraine headaches; 1990's or before"  . Sinus bradycardia    asymptomatic  . SSS (sick sinus syndrome) ([place])   . Stroke [facility]) 2012   "left me weaker on left side; balance is not good"    Past Surgical History:  Procedure Laterality Date  . ASD REPAIR  1972   Patch repair  . ATRIAL FLUTTER ABLATION N/A 07/28/2011   Procedure: ATRIAL FLUTTER ABLATION;  Surgeon: Thompson Grayer, MD;  Location: [facility] CATH LAB;  Service: Cardiovascular;  Laterality: N/A;  . CARDIAC ELECTROPHYSIOLOGY STUDY AND ABLATION  07/28/11  . CARDIOVASCULAR STRESS TEST  03/07/2010   Perfusion defect in the inferior myocardial region is consistent with diaphragmatic attenuation. No ischemia or infarct/scar is seen in the remaining myocardium. No ECG changes.  . CAROTID DOPPLER  06/26/2011   Bilateral ICAs-demonstrated normal patency without eivdence of significant diameter reduction, dissection, or any other vascular abnormality.  . INGUINAL HERNIA REPAIR  2011   right  . JOINT REPLACEMENT     right knee replacement  . KNEE ARTHROSCOPY     right; "maybe twice"  . TOTAL KNEE ARTHROPLASTY  07/10/2012   Procedure: TOTAL KNEE ARTHROPLASTY;  Surgeon: Tobi Bastos, MD;  Location: [facility] ORS;  Service: Orthopedics;  Laterality: Right;  . TRANSTHORACIC ECHOCARDIOGRAM  12/10/2012   EF 55-60%. No regional wall motion abnormalities. LA moderately dialted.Mild-moderate regurg of the tricuspid valve.    FAMHx:  Family History  Problem Relation Age of Onset  . Heart disease Father   . Cancer Sister        unsure what type    SOCHx:   reports that he has never smoked. He has never used smokeless tobacco. He reports that he drinks alcohol. He reports that he does not use drugs.  ALLERGIES:  No Known Allergies  ROS: Pertinent items noted in HPI and  remainder of comprehensive ROS otherwise negative.  HOME MEDS: Current Outpatient Medications  Medication Sig Dispense Refill  . BESIVANCE 0.6 % SUSP     . bimatoprost (LUMIGAN) 0.03 % ophthalmic solution Place 1 drop into both eyes at bedtime. For 3 weeks, then return for a check up. Starting on 03/13/2014.    . cefPROZIL (CEFZIL) 500 MG tablet Take 1 tablet (500 mg total) by mouth 2 (two) times daily. For scrotum infection 14 tablet 0  . doxazosin (CARDURA) 4 MG tablet TAKE 1 TABLET DAILY 90 tablet 3  . DUREZOL 0.05 % EMUL     . ferrous sulfate 325 (65 FE) MG tablet Take 325 mg by mouth 2 (two) times daily after a meal.     . finasteride (PROSCAR) 5 MG tablet Take  1 tablet (5 mg total) by mouth daily. 30 tablet 11  . furosemide (LASIX) 40 MG tablet Take 1 tablet (40 mg total) by mouth as needed. 90 tablet 1  . gabapentin (NEURONTIN) 300 MG capsule 2 po qhs for chronic pain 60 capsule 5  . HYDROcodone-acetaminophen (NORCO) 10-325 MG tablet Take 1 tablet by mouth every 8 (eight) hours as needed for up to 7 days. 21 tablet 0  . levothyroxine (SYNTHROID, LEVOTHROID) 50 MCG tablet Take 1 tablet (50 mcg total) by mouth daily. 90 tablet 0  . Multiple Vitamin (MULTIVITAMIN WITH MINERALS) TABS tablet Take 1 tablet by mouth daily.    . naproxen sodium (ALEVE) 220 MG tablet Take 220 mg by mouth daily as needed (arthritis pain).    . phenytoin (DILANTIN) 100 MG ER capsule TAKE 3 CAPSULES AT BEDTIME 270 capsule 3  . Polyethyl Glycol-Propyl Glycol (SYSTANE ULTRA) 0.4-0.3 % SOLN Place 1 drop into both eyes daily.    . predniSONE (DELTASONE) 5 MG tablet Take 1 tablet (5 mg total) by mouth daily with breakfast. 30 tablet 2  . PROLENSA 0.07 % SOLN     . tamsulosin (FLOMAX) 0.4 MG CAPS capsule Take 1 capsule (0.4 mg total) by mouth 2 (two) times daily. 180 capsule 3  . traMADol (ULTRAM) 50 MG tablet Take 1 tablet (50 mg total) by mouth every 8 (eight) hours as needed. 30 tablet 0  . TRAVATAN Z 0.004 % SOLN  ophthalmic solution Place 1 drop into both eyes daily.    Marland Kitchen warfarin (COUMADIN) 5 MG tablet Take 1/2 to 1 tablet daily as directed by coumadin clinic. NEED INR appointment prior to next refill authorization. 90 tablet 0   No current facility-administered medications for this visit.     LABS/IMAGING: No results found for this or any previous visit (from the past 48 hour(s)). No results found.  VITALS: BP (!) 156/78   Pulse 62   Ht 5\' 9"  (1.753 m)   Wt 148 lb (67.1 kg)   BMI 21.86 kg/m   EXAM: General appearance: alert and no distress Neck: no carotid bruit, no JVD and thyroid not enlarged, symmetric, no tenderness/mass/nodules Lungs: clear to auscultation bilaterally Heart: regular rate and rhythm, S1, S2 normal, no murmur, click, rub or gallop Abdomen: soft, non-tender; bowel sounds normal; no masses,  no organomegaly Extremities: extremities normal, atraumatic, no cyanosis or edema Pulses: 2+ and symmetric Skin: Skin color, texture, turgor normal. No rashes or lesions Neurologic: Mental status: Alert, oriented, thought content appropriate PSych: Normal  EKG: Sinus rhythm first-degree AV block at 62-personally reviewed  ASSESSMENT: 1. Paroxysmal atrial fibrillation status post ablation, maintaining sinus - CHADSVASC score 5 2. Hypertension 3. Prior stroke with aphasia and balance problems 4. Ongoing low back pain 5. Chronic anticoagulation on warfarin 6. History of atrial septal defect status post patch repair  PLAN: 1.   Mr. Stuhr returns for routine follow-up.  Overall he is complaining mostly lower extremity swelling.  In the past he is worn stockings but is not wearing them today.  Pressure is somewhat elevated although has been better at home.  He is anticoagulated on warfarin.  He denies any recurrent A. fib.  He has had no new strokes.  No changes were made to his medicines today.  Plan to see him back annually or sooner as necessary.  I did provide him with  compression stocking prescription.  Pixie Casino, MD, [facility], [place] Director of  the [facility] Diplomate of the American Board of Clinical Lipidology Attending Cardiologist  Direct Dial: [phone]  Fax: [phone]  Website:  [email or web address] 11/01/2017, 2:36 PM

## 2017-11-01 NOTE — Patient Instructions (Signed)
Dr. Debara Pickett recommends KNEE HIGH COMPRESSION STOCKINGS  -- 20-30 mmHg (compression strength) -- [facility]  -- [address]  -- [place]   -- Jay Walters  -- [address] [place]  -- [phone]  Your physician wants you to follow-up in: ONE YEAR with Dr. Debara Pickett. You will receive a reminder letter in the mail two months in advance. If you don't receive a letter, please call our office to schedule the follow-up appointment.   How to Use Compression Stockings Compression stockings are elastic socks that squeeze the legs. They help to increase blood flow to the legs, decrease swelling in the legs, and reduce the chance of developing blood clots in the lower legs. Compression stockings are often used by people who:  Are recovering from surgery.  Have poor circulation in their legs.  Are prone to getting blood clots in their legs.  Have varicose veins.  Sit or stay in bed for long periods of time. How to use compression stockings Before you put on your compression stockings:  Make sure that they are the correct size. If you do not know your size, ask your health care provider.  Make sure that they are clean, dry, and in good condition.  Check them for rips and tears. Do not put them on if they are ripped or torn. Put your stockings on first thing in the morning, before you get out of bed. Keep them on for as long as your health care provider advises. When you are wearing your stockings:  Keep them as smooth as possible. Do not allow them to bunch up. It is especially important to prevent the stockings from bunching up around your toes or behind your knees.  Do not roll the stockings downward and leave them rolled down. This can decrease blood flow to your leg.  Change them right away if they become wet or dirty. When you take off your  stockings, inspect your legs and feet. Anything that does not seem normal may require medical attention. Look for:  Open sores.  Red spots.  Swelling. Information and tips  Do not stop wearing your compression stockings without talking to your health care provider first.  Wash your stockings every day with mild detergent in cold or warm water. Do not use bleach. Air-dry your stockings or dry them in a clothes dryer on low heat.  Replace your stockings every 3-6 months.  If skin moisturizing is part of your treatment plan, apply lotion or cream at night so that your skin will be dry when you put on the stockings in the morning. It is harder to put the stockings on when you have lotion on your legs or feet. Contact a health care provider if: Remove your stockings and seek medical care if:  You have a feeling of pins and needles in your feet or legs.  You have any new changes in your skin.  You have skin lesions that are getting worse.  You have swelling or pain that is getting worse. Get help right away if:  You have numbness or tingling in your lower legs that does not get better right after you take the stockings off.  Your toes or feet become cold and blue.  You develop open sores or red spots on your legs that do not go away.  You see or feel a warm spot on your  leg.  You have new swelling or soreness in your leg.  You are short of breath or you have chest pain for no reason.  You have a rapid or irregular heartbeat.  You feel light-headed or dizzy. This information is not intended to replace advice given to you by your health care provider. Make sure you discuss any questions you have with your health care provider. Document Released: 03/26/2009 Document Revised: 10/27/2015 Document Reviewed: 05/06/2014 Elsevier Interactive Patient Education  2017 [employer].

## 2017-11-07 ENCOUNTER — Encounter: Payer: Self-pay | Admitting: Internal Medicine

## 2017-11-08 ENCOUNTER — Other Ambulatory Visit: Payer: 59 | Admitting: Internal Medicine

## 2017-11-13 ENCOUNTER — Other Ambulatory Visit (INDEPENDENT_AMBULATORY_CARE_PROVIDER_SITE_OTHER): Payer: 59 | Admitting: Internal Medicine

## 2017-11-13 DIAGNOSIS — R609 Edema, unspecified: Secondary | ICD-10-CM | POA: Diagnosis not present

## 2017-11-13 DIAGNOSIS — Z5181 Encounter for therapeutic drug level monitoring: Secondary | ICD-10-CM

## 2017-11-13 DIAGNOSIS — Z79899 Other long term (current) drug therapy: Secondary | ICD-10-CM

## 2017-11-13 LAB — BASIC METABOLIC PANEL
BUN: 20 mg/dL (ref 7–25)
CHLORIDE: 108 mmol/L (ref 98–110)
CO2: 27 mmol/L (ref 20–32)
CREATININE: 0.85 mg/dL (ref 0.70–1.11)
Calcium: 9.4 mg/dL (ref 8.6–10.3)
GLUCOSE: 96 mg/dL (ref 65–99)
Potassium: 4.6 mmol/L (ref 3.5–5.3)
SODIUM: 142 mmol/L (ref 135–146)

## 2017-11-20 ENCOUNTER — Other Ambulatory Visit: Payer: Self-pay | Admitting: Internal Medicine

## 2017-12-12 ENCOUNTER — Other Ambulatory Visit: Payer: Self-pay | Admitting: Internal Medicine

## 2017-12-17 ENCOUNTER — Telehealth: Payer: Self-pay

## 2017-12-17 NOTE — Telephone Encounter (Signed)
Patient's wife called states patient has been complaining of shoulder pain for weeks now, she would like to know if they can come see you? She said he does not have an orthopedist. Faythe Ghee to schedule?

## 2017-12-17 NOTE — Telephone Encounter (Signed)
Jay Walters was notified and provided [facility]'s number.

## 2017-12-17 NOTE — Telephone Encounter (Signed)
This is an orthopedic problem. Please refer them them to [facility] orthopedics Dr. Onnie Graham who is a shoulder expert. He has seen Dr. Gladstone Lighter there for his knee.

## 2017-12-30 ENCOUNTER — Inpatient Hospital Stay (HOSPITAL_COMMUNITY)
Admission: EM | Admit: 2017-12-30 | Discharge: 2018-01-04 | DRG: 871 | Disposition: A | Discharge status: Hospice | Payer: 59 | Attending: Internal Medicine | Admitting: Internal Medicine

## 2017-12-30 ENCOUNTER — Other Ambulatory Visit: Payer: Self-pay

## 2017-12-30 ENCOUNTER — Encounter (HOSPITAL_COMMUNITY): Payer: Self-pay | Admitting: Internal Medicine

## 2017-12-30 ENCOUNTER — Emergency Department (HOSPITAL_COMMUNITY): Payer: 59

## 2017-12-30 DIAGNOSIS — C7951 Secondary malignant neoplasm of bone: Secondary | ICD-10-CM | POA: Diagnosis present

## 2017-12-30 DIAGNOSIS — J309 Allergic rhinitis, unspecified: Secondary | ICD-10-CM | POA: Diagnosis present

## 2017-12-30 DIAGNOSIS — R599 Enlarged lymph nodes, unspecified: Secondary | ICD-10-CM | POA: Diagnosis not present

## 2017-12-30 DIAGNOSIS — K449 Diaphragmatic hernia without obstruction or gangrene: Secondary | ICD-10-CM | POA: Diagnosis present

## 2017-12-30 DIAGNOSIS — E870 Hyperosmolality and hypernatremia: Secondary | ICD-10-CM | POA: Diagnosis present

## 2017-12-30 DIAGNOSIS — R410 Disorientation, unspecified: Secondary | ICD-10-CM | POA: Diagnosis not present

## 2017-12-30 DIAGNOSIS — R1312 Dysphagia, oropharyngeal phase: Secondary | ICD-10-CM | POA: Diagnosis present

## 2017-12-30 DIAGNOSIS — E782 Mixed hyperlipidemia: Secondary | ICD-10-CM | POA: Diagnosis present

## 2017-12-30 DIAGNOSIS — G40909 Epilepsy, unspecified, not intractable, without status epilepticus: Secondary | ICD-10-CM | POA: Diagnosis present

## 2017-12-30 DIAGNOSIS — I4892 Unspecified atrial flutter: Secondary | ICD-10-CM | POA: Diagnosis not present

## 2017-12-30 DIAGNOSIS — I6381 Other cerebral infarction due to occlusion or stenosis of small artery: Secondary | ICD-10-CM | POA: Diagnosis not present

## 2017-12-30 DIAGNOSIS — R0689 Other abnormalities of breathing: Secondary | ICD-10-CM | POA: Diagnosis not present

## 2017-12-30 DIAGNOSIS — Z6822 Body mass index (BMI) 22.0-22.9, adult: Secondary | ICD-10-CM

## 2017-12-30 DIAGNOSIS — R1902 Left upper quadrant abdominal swelling, mass and lump: Secondary | ICD-10-CM

## 2017-12-30 DIAGNOSIS — R0902 Hypoxemia: Secondary | ICD-10-CM

## 2017-12-30 DIAGNOSIS — K219 Gastro-esophageal reflux disease without esophagitis: Secondary | ICD-10-CM | POA: Diagnosis present

## 2017-12-30 DIAGNOSIS — A411 Sepsis due to other specified staphylococcus: Principal | ICD-10-CM | POA: Diagnosis present

## 2017-12-30 DIAGNOSIS — M5000 Cervical disc disorder with myelopathy, unspecified cervical region: Secondary | ICD-10-CM | POA: Diagnosis present

## 2017-12-30 DIAGNOSIS — I484 Atypical atrial flutter: Secondary | ICD-10-CM | POA: Diagnosis not present

## 2017-12-30 DIAGNOSIS — R791 Abnormal coagulation profile: Secondary | ICD-10-CM | POA: Diagnosis present

## 2017-12-30 DIAGNOSIS — R0600 Dyspnea, unspecified: Secondary | ICD-10-CM | POA: Diagnosis not present

## 2017-12-30 DIAGNOSIS — R911 Solitary pulmonary nodule: Secondary | ICD-10-CM | POA: Diagnosis not present

## 2017-12-30 DIAGNOSIS — C786 Secondary malignant neoplasm of retroperitoneum and peritoneum: Secondary | ICD-10-CM | POA: Diagnosis present

## 2017-12-30 DIAGNOSIS — Z7901 Long term (current) use of anticoagulants: Secondary | ICD-10-CM

## 2017-12-30 DIAGNOSIS — Z66 Do not resuscitate: Secondary | ICD-10-CM | POA: Diagnosis present

## 2017-12-30 DIAGNOSIS — R4781 Slurred speech: Secondary | ICD-10-CM | POA: Diagnosis not present

## 2017-12-30 DIAGNOSIS — N179 Acute kidney failure, unspecified: Secondary | ICD-10-CM | POA: Diagnosis present

## 2017-12-30 DIAGNOSIS — C7801 Secondary malignant neoplasm of right lung: Secondary | ICD-10-CM | POA: Diagnosis present

## 2017-12-30 DIAGNOSIS — E43 Unspecified severe protein-calorie malnutrition: Secondary | ICD-10-CM | POA: Diagnosis present

## 2017-12-30 DIAGNOSIS — C7972 Secondary malignant neoplasm of left adrenal gland: Secondary | ICD-10-CM | POA: Diagnosis present

## 2017-12-30 DIAGNOSIS — C7971 Secondary malignant neoplasm of right adrenal gland: Secondary | ICD-10-CM | POA: Diagnosis present

## 2017-12-30 DIAGNOSIS — I110 Hypertensive heart disease with heart failure: Secondary | ICD-10-CM | POA: Diagnosis present

## 2017-12-30 DIAGNOSIS — I495 Sick sinus syndrome: Secondary | ICD-10-CM | POA: Diagnosis present

## 2017-12-30 DIAGNOSIS — I5032 Chronic diastolic (congestive) heart failure: Secondary | ICD-10-CM | POA: Diagnosis present

## 2017-12-30 DIAGNOSIS — C779 Secondary and unspecified malignant neoplasm of lymph node, unspecified: Secondary | ICD-10-CM | POA: Diagnosis present

## 2017-12-30 DIAGNOSIS — C3411 Malignant neoplasm of upper lobe, right bronchus or lung: Secondary | ICD-10-CM | POA: Diagnosis not present

## 2017-12-30 DIAGNOSIS — J9601 Acute respiratory failure with hypoxia: Secondary | ICD-10-CM | POA: Diagnosis present

## 2017-12-30 DIAGNOSIS — J181 Lobar pneumonia, unspecified organism: Secondary | ICD-10-CM | POA: Diagnosis present

## 2017-12-30 DIAGNOSIS — R531 Weakness: Secondary | ICD-10-CM | POA: Diagnosis not present

## 2017-12-30 DIAGNOSIS — J90 Pleural effusion, not elsewhere classified: Secondary | ICD-10-CM | POA: Diagnosis not present

## 2017-12-30 DIAGNOSIS — G8929 Other chronic pain: Secondary | ICD-10-CM | POA: Diagnosis present

## 2017-12-30 DIAGNOSIS — C7989 Secondary malignant neoplasm of other specified sites: Secondary | ICD-10-CM | POA: Diagnosis present

## 2017-12-30 DIAGNOSIS — K669 Disorder of peritoneum, unspecified: Secondary | ICD-10-CM | POA: Diagnosis not present

## 2017-12-30 DIAGNOSIS — C3491 Malignant neoplasm of unspecified part of right bronchus or lung: Secondary | ICD-10-CM

## 2017-12-30 DIAGNOSIS — C799 Secondary malignant neoplasm of unspecified site: Secondary | ICD-10-CM | POA: Diagnosis not present

## 2017-12-30 DIAGNOSIS — Z8249 Family history of ischemic heart disease and other diseases of the circulatory system: Secondary | ICD-10-CM

## 2017-12-30 DIAGNOSIS — Z79890 Hormone replacement therapy (postmenopausal): Secondary | ICD-10-CM

## 2017-12-30 DIAGNOSIS — R64 Cachexia: Secondary | ICD-10-CM | POA: Diagnosis present

## 2017-12-30 DIAGNOSIS — R29705 NIHSS score 5: Secondary | ICD-10-CM | POA: Diagnosis not present

## 2017-12-30 DIAGNOSIS — R0602 Shortness of breath: Secondary | ICD-10-CM | POA: Diagnosis present

## 2017-12-30 DIAGNOSIS — J189 Pneumonia, unspecified organism: Secondary | ICD-10-CM | POA: Diagnosis present

## 2017-12-30 DIAGNOSIS — R918 Other nonspecific abnormal finding of lung field: Secondary | ICD-10-CM

## 2017-12-30 DIAGNOSIS — Z7401 Bed confinement status: Secondary | ICD-10-CM

## 2017-12-30 DIAGNOSIS — I639 Cerebral infarction, unspecified: Secondary | ICD-10-CM | POA: Diagnosis not present

## 2017-12-30 DIAGNOSIS — Z96651 Presence of right artificial knee joint: Secondary | ICD-10-CM | POA: Diagnosis present

## 2017-12-30 DIAGNOSIS — C3490 Malignant neoplasm of unspecified part of unspecified bronchus or lung: Secondary | ICD-10-CM | POA: Diagnosis present

## 2017-12-30 DIAGNOSIS — E785 Hyperlipidemia, unspecified: Secondary | ICD-10-CM | POA: Diagnosis present

## 2017-12-30 DIAGNOSIS — I672 Cerebral atherosclerosis: Secondary | ICD-10-CM | POA: Diagnosis present

## 2017-12-30 DIAGNOSIS — E7800 Pure hypercholesterolemia, unspecified: Secondary | ICD-10-CM | POA: Diagnosis present

## 2017-12-30 DIAGNOSIS — Z8673 Personal history of transient ischemic attack (TIA), and cerebral infarction without residual deficits: Secondary | ICD-10-CM

## 2017-12-30 DIAGNOSIS — E279 Disorder of adrenal gland, unspecified: Secondary | ICD-10-CM | POA: Diagnosis not present

## 2017-12-30 DIAGNOSIS — R52 Pain, unspecified: Secondary | ICD-10-CM | POA: Diagnosis not present

## 2017-12-30 DIAGNOSIS — Z79899 Other long term (current) drug therapy: Secondary | ICD-10-CM

## 2017-12-30 LAB — COMPREHENSIVE METABOLIC PANEL
ALK PHOS: 67 U/L (ref 38–126)
ALT: 15 U/L (ref 0–44)
AST: 16 U/L (ref 15–41)
Albumin: 2.1 g/dL — ABNORMAL LOW (ref 3.5–5.0)
Anion gap: 13 (ref 5–15)
BUN: 28 mg/dL — AB (ref 8–23)
CALCIUM: 8.2 mg/dL — AB (ref 8.9–10.3)
CHLORIDE: 110 mmol/L (ref 98–111)
CO2: 20 mmol/L — AB (ref 22–32)
CREATININE: 1.23 mg/dL (ref 0.61–1.24)
GFR calc Af Amer: >60 mL/min (ref >60)
GFR, EST NON AFRICAN AMERICAN: 52 mL/min — AB (ref >60)
Glucose, Bld: 117 mg/dL — ABNORMAL HIGH (ref 70–99)
Potassium: 3.7 mmol/L (ref 3.5–5.1)
SODIUM: 143 mmol/L (ref 135–145)
Total Bilirubin: 0.9 mg/dL (ref 0.3–1.2)
Total Protein: 5.2 g/dL — ABNORMAL LOW (ref 6.5–8.1)

## 2017-12-30 LAB — CBC WITH DIFFERENTIAL/PLATELET
Abs Immature Granulocytes: 0.3 10*3/uL — ABNORMAL HIGH (ref 0.0–0.1)
Basophils Absolute: 0.2 10*3/uL — ABNORMAL HIGH (ref 0.0–0.1)
Basophils Relative: 1 %
EOS ABS: 1.2 10*3/uL — AB (ref 0.0–0.7)
Eosinophils Relative: 5 %
HEMATOCRIT: 41.7 % (ref 39.0–52.0)
HEMOGLOBIN: 13.2 g/dL (ref 13.0–17.0)
IMMATURE GRANULOCYTES: 1 %
LYMPHS ABS: 1.6 10*3/uL (ref 0.7–4.0)
LYMPHS PCT: 7 %
MCH: 30.6 pg (ref 26.0–34.0)
MCHC: 31.7 g/dL (ref 30.0–36.0)
MCV: 96.5 fL (ref 78.0–100.0)
MONOS PCT: 7 %
Monocytes Absolute: 1.7 10*3/uL — ABNORMAL HIGH (ref 0.1–1.0)
NEUTROS PCT: 79 %
Neutro Abs: 18 10*3/uL — ABNORMAL HIGH (ref 1.7–7.7)
Platelets: 287 10*3/uL (ref 150–400)
RBC: 4.32 MIL/uL (ref 4.22–5.81)
RDW: 15.7 % — AB (ref 11.5–15.5)
WBC: 22.9 10*3/uL — ABNORMAL HIGH (ref 4.0–10.5)

## 2017-12-30 LAB — I-STAT CG4 LACTIC ACID, ED
Lactic Acid, Venous: 2.05 mmol/L (ref 0.5–1.9)
Lactic Acid, Venous: 1.66 mmol/L (ref 0.5–1.9)

## 2017-12-30 LAB — PROTIME-INR: Prothrombin Time: >90 seconds — ABNORMAL HIGH (ref 11.4–15.2)

## 2017-12-30 LAB — I-STAT TROPONIN, ED: Troponin i, poc: 0.02 ng/mL (ref 0.00–0.08)

## 2017-12-30 LAB — URINALYSIS, ROUTINE W REFLEX MICROSCOPIC
Bilirubin Urine: NEGATIVE
GLUCOSE, UA: NEGATIVE mg/dL
Hgb urine dipstick: NEGATIVE
Ketones, ur: 5 mg/dL — AB
LEUKOCYTES UA: NEGATIVE
NITRITE: NEGATIVE
PH: 5 (ref 5.0–8.0)
Protein, ur: NEGATIVE mg/dL

## 2017-12-30 LAB — TSH: TSH: 1.922 u[IU]/mL (ref 0.350–4.500)

## 2017-12-30 LAB — BRAIN NATRIURETIC PEPTIDE: B NATRIURETIC PEPTIDE 5: 150 pg/mL — AB (ref 0.0–100.0)

## 2017-12-30 MED ORDER — ONDANSETRON HCL 4 MG/2ML IJ SOLN: 4.0000 mg | Freq: Four times a day (QID) | INTRAMUSCULAR | Status: DC | PRN

## 2017-12-30 MED ORDER — HYDROCODONE-ACETAMINOPHEN 10-325 MG PO TABS
1.0000 | ORAL_TABLET | ORAL | Status: DC | PRN
  Administered 2017-12-30 – 2018-01-03 (×3): 1 via ORAL
  Filled 2017-12-30 (×3): qty 1

## 2017-12-30 MED ORDER — ACETAMINOPHEN 650 MG RE SUPP: 650.0000 mg | Freq: Four times a day (QID) | RECTAL | Status: DC | PRN

## 2017-12-30 MED ORDER — SODIUM CHLORIDE 0.9 % IV SOLN
500.0000 mg | INTRAVENOUS | Status: DC
  Administered 2017-12-30 – 2018-01-02 (×4): 500 mg via INTRAVENOUS
  Filled 2017-12-30 (×7): qty 500

## 2017-12-30 MED ORDER — WARFARIN SODIUM 2.5 MG PO TABS: 2.5000 mg | ORAL_TABLET | Freq: Once | ORAL | Status: DC

## 2017-12-30 MED ORDER — FERROUS SULFATE 325 (65 FE) MG PO TABS
325.0000 mg | ORAL_TABLET | Freq: Two times a day (BID) | ORAL | Status: DC
  Administered 2017-12-30 – 2018-01-04 (×10): 325 mg via ORAL
  Filled 2017-12-30 (×10): qty 1

## 2017-12-30 MED ORDER — TAMSULOSIN HCL 0.4 MG PO CAPS
0.4000 mg | ORAL_CAPSULE | Freq: Two times a day (BID) | ORAL | Status: DC
  Administered 2017-12-30 – 2018-01-02 (×7): 0.4 mg via ORAL
  Filled 2017-12-30 (×7): qty 1

## 2017-12-30 MED ORDER — IOHEXOL 300 MG/ML  SOLN
75.0000 mL | Freq: Once | INTRAMUSCULAR | Status: AC | PRN
  Administered 2017-12-30: 75 mL via INTRAVENOUS

## 2017-12-30 MED ORDER — ONDANSETRON HCL 4 MG PO TABS: 4.0000 mg | ORAL_TABLET | Freq: Four times a day (QID) | ORAL | Status: DC | PRN

## 2017-12-30 MED ORDER — ACETAMINOPHEN 325 MG PO TABS
650.0000 mg | ORAL_TABLET | Freq: Four times a day (QID) | ORAL | Status: DC | PRN
  Administered 2017-12-31: 650 mg via ORAL
  Filled 2017-12-30: qty 2

## 2017-12-30 MED ORDER — SODIUM CHLORIDE 0.9 % IV BOLUS (SEPSIS)
1000.0000 mL | Freq: Once | INTRAVENOUS | Status: AC
  Administered 2017-12-30: 1000 mL via INTRAVENOUS

## 2017-12-30 MED ORDER — TRAZODONE HCL 50 MG PO TABS
25.0000 mg | ORAL_TABLET | Freq: Every evening | ORAL | Status: DC | PRN
  Administered 2017-12-30: 25 mg via ORAL
  Filled 2017-12-30: qty 1

## 2017-12-30 MED ORDER — POTASSIUM CHLORIDE IN NACL 20-0.9 MEQ/L-% IV SOLN
INTRAVENOUS | Status: DC
  Administered 2017-12-30 – 2018-01-01 (×4): via INTRAVENOUS
  Filled 2017-12-30 (×4): qty 1000

## 2017-12-30 MED ORDER — HYPROMELLOSE (GONIOSCOPIC) 2.5 % OP SOLN: 1.0000 [drp] | Freq: Every day | OPHTHALMIC | Status: DC | PRN

## 2017-12-30 MED ORDER — DOXAZOSIN MESYLATE 4 MG PO TABS
4.0000 mg | ORAL_TABLET | Freq: Every day | ORAL | Status: DC
  Administered 2017-12-30 – 2018-01-01 (×3): 4 mg via ORAL
  Filled 2017-12-30 (×2): qty 2
  Filled 2017-12-30: qty 1
  Filled 2017-12-30: qty 2
  Filled 2017-12-30 (×3): qty 1

## 2017-12-30 MED ORDER — ADULT MULTIVITAMIN W/MINERALS CH
1.0000 | ORAL_TABLET | Freq: Every day | ORAL | Status: DC
  Administered 2017-12-30 – 2018-01-04 (×6): 1 via ORAL
  Filled 2017-12-30 (×6): qty 1

## 2017-12-30 MED ORDER — SODIUM CHLORIDE 0.9 % IV SOLN: 250.0000 mL | INTRAVENOUS | Status: DC | PRN

## 2017-12-30 MED ORDER — PHYTONADIONE 5 MG PO TABS
10.0000 mg | ORAL_TABLET | Freq: Once | ORAL | Status: AC
  Administered 2017-12-30: 10 mg via ORAL
  Filled 2017-12-30: qty 2

## 2017-12-30 MED ORDER — ATORVASTATIN CALCIUM 20 MG PO TABS
20.0000 mg | ORAL_TABLET | Freq: Every day | ORAL | Status: DC
  Administered 2017-12-30 – 2018-01-03 (×4): 20 mg via ORAL
  Filled 2017-12-30 (×4): qty 1

## 2017-12-30 MED ORDER — PHENYTOIN SODIUM EXTENDED 100 MG PO CAPS
300.0000 mg | ORAL_CAPSULE | Freq: Every day | ORAL | Status: DC
  Administered 2017-12-30 – 2018-01-01 (×3): 300 mg via ORAL
  Filled 2017-12-30 (×3): qty 3

## 2017-12-30 MED ORDER — FINASTERIDE 5 MG PO TABS
5.0000 mg | ORAL_TABLET | Freq: Every day | ORAL | Status: DC
  Administered 2017-12-30 – 2018-01-04 (×6): 5 mg via ORAL
  Filled 2017-12-30 (×6): qty 1

## 2017-12-30 MED ORDER — SODIUM CHLORIDE 0.9% FLUSH
3.0000 mL | Freq: Two times a day (BID) | INTRAVENOUS | Status: DC
  Administered 2017-12-30 – 2018-01-04 (×3): 3 mL via INTRAVENOUS

## 2017-12-30 MED ORDER — SODIUM CHLORIDE 0.9% FLUSH: 3.0000 mL | INTRAVENOUS | Status: DC | PRN

## 2017-12-30 MED ORDER — ENSURE ENLIVE PO LIQD
237.0000 mL | Freq: Two times a day (BID) | ORAL | Status: DC
  Administered 2017-12-30: 237 mL via ORAL

## 2017-12-30 MED ORDER — SODIUM CHLORIDE 0.9 % IV SOLN
2.0000 g | INTRAVENOUS | Status: DC
  Administered 2017-12-30 – 2017-12-31 (×2): 2 g via INTRAVENOUS
  Filled 2017-12-30 (×3): qty 20

## 2017-12-30 NOTE — ED Provider Notes (Signed)
[place] EMERGENCY DEPARTMENT Provider Note   CSN: [id number] Arrival date & time: 12/30/17  1540     History   Chief Complaint Chief Complaint  Patient presents with  . Chest Pain  . Weakness  . Shortness of Breath  . Fever    HPI Jay Walters is a 84 y.o. male.  HPI   Jay Walters is a 84 y.o. male, with a history of HTN, stroke, A. fib, a flutter, presenting to the ED with cough and shortness of breath for the past week.  Accompanied by intermittent subjective fever.  Denies chest pain, abdominal pain, N/V/D, hemoptysis, dizziness, diaphoresis, trauma, peripheral edema, or any other complaints.  Past Medical History:  Diagnosis Date  . Anemia   . Arthritis    "right hip; right knee; lower back ~ 1/2 way across"  . Atrial fibrillation ([place])    remotely   . Atrial flutter ([place])    typical appearing by ekg. s/p ablation 07/28/11  . Diverticulosis   . Dysphagia    with solids  . Esophageal stricture   . GERD (gastroesophageal reflux disease)   . H/O hiatal hernia   . Heart murmur   . Hemorrhoids   . High cholesterol   . Hypertension   . Osteopenia   . Seizures ([place])    "back w/migraine headaches; 1990's or before"  . Sinus bradycardia    asymptomatic  . SSS (sick sinus syndrome) ([place])   . Stroke [facility]) 2012   "left me weaker on left side; balance is not good"    Patient Active Problem List   Diagnosis Date Noted  . Cerebral arteriosclerosis with history of previous stroke 12/30/2017  . Acute respiratory insufficiency 12/30/2017  . Bilateral lower extremity edema 11/01/2017  . Chronic diastolic heart failure ([place]) 11/01/2017  . Mixed hyperlipidemia 05/02/2016  . Dependent edema 07/01/2015  . S/P ablation of atrial flutter 03/24/2013  . Spinal stenosis of lumbar region 01/07/2013  . S/P right unicompartmental knee replacement 10/07/2012  . Long term current use of anticoagulant therapy 09/28/2012  . Cervical disc disease with  myelopathy 08/10/2012  . HTN (hypertension) 07/28/2012  . Pulmonary nodules 09/19/2011  . Atrial flutter ([place]) 08/28/2011  . Allergic rhinitis 08/28/2011  . Status post patch closure of ASD 05/09/2011  . Seizure disorder ([place]) 04/18/2011  . BPH (benign prostatic hyperplasia) 04/18/2011  . Osteopenia 04/18/2011  . Vitamin D deficiency 04/18/2011  . GERD 08/16/2010    Past Surgical History:  Procedure Laterality Date  . ASD REPAIR  1972   Patch repair  . ATRIAL FLUTTER ABLATION N/A 07/28/2011   Procedure: ATRIAL FLUTTER ABLATION;  Surgeon: Thompson Grayer, MD;  Location: [facility] CATH LAB;  Service: Cardiovascular;  Laterality: N/A;  . CARDIAC ELECTROPHYSIOLOGY STUDY AND ABLATION  07/28/11  . CARDIOVASCULAR STRESS TEST  03/07/2010   Perfusion defect in the inferior myocardial region is consistent with diaphragmatic attenuation. No ischemia or infarct/scar is seen in the remaining myocardium. No ECG changes.  . CAROTID DOPPLER  06/26/2011   Bilateral ICAs-demonstrated normal patency without eivdence of significant diameter reduction, dissection, or any other vascular abnormality.  . INGUINAL HERNIA REPAIR  2011   right  . JOINT REPLACEMENT     right knee replacement  . KNEE ARTHROSCOPY     right; "maybe twice"  . TOTAL KNEE ARTHROPLASTY  07/10/2012   Procedure: TOTAL KNEE ARTHROPLASTY;  Surgeon: Tobi Bastos, MD;  Location: [facility] ORS;  Service: Orthopedics;  Laterality: Right;  .  TRANSTHORACIC ECHOCARDIOGRAM  12/10/2012   EF 55-60%. No regional wall motion abnormalities. LA moderately dialted.Mild-moderate regurg of the tricuspid valve.        Home Medications    Prior to Admission medications   Medication Sig Start Date End Date Taking? Authorizing Provider  acetaminophen (TYLENOL) 500 MG tablet Take 1,000 mg by mouth every 6 (six) hours as needed for mild pain or moderate pain.   Yes [provider]  atorvastatin (LIPITOR) 20 MG tablet TAKE 1 TABLET DAILY 12/12/17  Yes Baxley, Cresenciano Lick, MD  doxazosin (CARDURA) 4 MG tablet TAKE 1 TABLET DAILY 12/07/16  Yes Baxley, Cresenciano Lick, MD  ferrous sulfate 325 (65 FE) MG tablet Take 325 mg by mouth 2 (two) times daily after a meal.    Yes [provider]  finasteride (PROSCAR) 5 MG tablet Take 1 tablet (5 mg total) by mouth daily. 12/07/16  Yes Baxley, Cresenciano Lick, MD  furosemide (LASIX) 40 MG tablet Take 1 tablet (40 mg total) by mouth as needed. 10/25/17  Yes Baxley, Cresenciano Lick, MD  HYDROcodone-acetaminophen (NORCO) 10-325 MG tablet Take 1 tablet by mouth as needed. 11/13/17  Yes [provider]  Multiple Vitamin (MULTIVITAMIN WITH MINERALS) TABS tablet Take 1 tablet by mouth daily.   Yes [provider]  phenytoin (DILANTIN) 100 MG ER capsule TAKE 3 CAPSULES AT BEDTIME 12/12/17  Yes Baxley, Cresenciano Lick, MD  Polyethyl Glycol-Propyl Glycol (SYSTANE ULTRA) 0.4-0.3 % SOLN Place 1 drop into both eyes daily.   Yes [provider]  tamsulosin (FLOMAX) 0.4 MG CAPS capsule Take 1 capsule (0.4 mg total) by mouth 2 (two) times daily. 12/07/16  Yes Baxley, Cresenciano Lick, MD  warfarin (COUMADIN) 5 MG tablet TAKE ONE-HALF (1/2) TO ONE TABLET DAILY AS DIRECTED BY COUMADIN CLINIC. NEED INR APPOINTMENT PRIOR TO NEXT REFILL AUTHORIZATION. Patient taking differently: Take 2.5-5 mg by mouth one time only at 6 PM. Take 2.5 mg on Tuesday, thursday, Sunday 5 mg Monday, wedsesday, Friday, saturday 11/20/17  Yes Hilty, Nadean Corwin, MD    Family History Family History  Problem Relation Age of Onset  . Heart disease Father   . Cancer Sister        unsure what type    Social History Social History   Tobacco Use  . Smoking status: Never Smoker  . Smokeless tobacco: Never Used  Substance Use Topics  . Alcohol use: Yes    Comment: 07/28/11 "1/5 will last me a year; w/company"  . Drug use: No     Allergies   Patient has no known allergies.   Review of Systems Review of Systems  Constitutional: Positive for fever (intermittent, subjective).  Negative for diaphoresis.  Respiratory: Positive for cough and shortness of breath.   Cardiovascular: Negative for chest pain and leg swelling.  Gastrointestinal: Negative for abdominal pain, diarrhea, nausea and vomiting.  Neurological: Negative for dizziness and light-headedness.  All other systems reviewed and are negative.    Physical Exam Updated Vital Signs BP (!) 98/50   Pulse 67   Temp (S) 97.9 F (36.6 C) (Oral)   Resp (!) 26   SpO2 99%   Physical Exam  Constitutional: He appears well-developed and well-nourished. No distress.  HENT:  Head: Normocephalic and atraumatic.  Eyes: Conjunctivae are normal.  Neck: Neck supple.  Cardiovascular: Normal rate, regular rhythm, normal heart sounds and intact distal pulses.  Pulmonary/Chest: Effort normal. No respiratory distress. He has decreased breath sounds in the right upper field.  No increased work of breathing.  Speaks in full sentences without difficulty.  Abdominal: Soft. There is no tenderness. There is no guarding.  Musculoskeletal: He exhibits no edema.  Lymphadenopathy:    He has no cervical adenopathy.  Neurological: He is alert.  Skin: Skin is warm and dry. He is not diaphoretic.  Psychiatric: He has a normal mood and affect. His behavior is normal.  Nursing note and vitals reviewed.    ED Treatments / Results  Labs (all labs ordered are listed, but only abnormal results are displayed) Labs Reviewed  CBC WITH DIFFERENTIAL/PLATELET - Abnormal; Notable for the following components:      Result Value   WBC 22.9 (*)    RDW 15.7 (*)    Neutro Abs 18.0 (*)    Monocytes Absolute 1.7 (*)    Eosinophils Absolute 1.2 (*)    Basophils Absolute 0.2 (*)    Abs Immature Granulocytes 0.3 (*)    All other components within normal limits  BRAIN NATRIURETIC PEPTIDE - Abnormal; Notable for the following components:   B Natriuretic Peptide 150.0 (*)    All other components within normal limits  COMPREHENSIVE METABOLIC  PANEL - Abnormal; Notable for the following components:   CO2 20 (*)    Glucose, Bld 117 (*)    BUN 28 (*)    Calcium 8.2 (*)    Total Protein 5.2 (*)    Albumin 2.1 (*)    GFR calc non Af Amer 52 (*)    All other components within normal limits  I-STAT CG4 LACTIC ACID, ED - Abnormal; Notable for the following components:   Lactic Acid, Venous 2.05 (*)    All other components within normal limits  CULTURE, BLOOD (ROUTINE X 2)  CULTURE, BLOOD (ROUTINE X 2)  CULTURE, EXPECTORATED SPUTUM-ASSESSMENT  URINALYSIS, ROUTINE W REFLEX MICROSCOPIC  TSH  PROTIME-INR  I-STAT TROPONIN, ED  I-STAT CG4 LACTIC ACID, ED  I-STAT CG4 LACTIC ACID, ED  I-STAT CG4 LACTIC ACID, ED    EKG EKG Interpretation  Date/Time:  Sunday December 30 2017 06:28:16 EDT Ventricular Rate:  68 PR Interval:    QRS Duration: 90 QT Interval:  453 QTC Calculation: 482 R Axis:   6 Text Interpretation:  Sinus rhythm Atrial premature complexes Prolonged PR interval Low voltage, extremity leads Borderline prolonged QT interval no significant change since 2015 Confirmed by Sherwood Gambler [phone]) on 12/30/2017 7:35:24 AM Also confirmed by Sherwood Gambler [phone]), editor Lynder Parents [phone])  on 12/30/2017 8:59:44 AM   Radiology Dg Chest 2 View  Result Date: 12/30/2017 CLINICAL DATA:  Shortness of breath several weeks. EXAM: CHEST - 2 VIEW COMPARISON:  02/02/2015 FINDINGS: New confluent consolidation over the right upper lobe with suggestion of pleural fluid over the right apex. Mild tracheal deviation to the right. Left lung is clear. Cardiac silhouette is within normal. Remainder the exam is unchanged. IMPRESSION: New right upper lobe collapse with possible loculated right apical pleural fluid. Recommend chest CT with contrast for further evaluation to exclude central obstructing lesion. Electronically Signed   By: Marin Olp M.D.   On: 12/30/2017 07:42   Ct Chest W Contrast  Result Date: 12/30/2017 CLINICAL DATA:   Chest pain and shortness-of-breath. EXAM: CT CHEST WITH CONTRAST TECHNIQUE: Multidetector CT imaging of the chest was performed during intravenous contrast administration. CONTRAST:  33mL OMNIPAQUE IOHEXOL 300 MG/ML  SOLN COMPARISON:  Chest x-ray today as well as chest CT 04/16/2012 FINDINGS: Cardiovascular: Heart is normal size. Calcified plaque over the  left anterior descending coronary artery. Minimal calcified plaque over the thoracic aorta. Prominence of the main pulmonary arteries. No evidence of pulmonary embolism. Mild compression of the SVC due to the right upper lobe lung mass as well as mediastinal adenopathy. Right upper lobe pulmonary artery is also mildly compressed by the adjacent adenopathy and right upper lobe mass. Mediastinum/Nodes: Extensive mediastinal adenopathy. Right superior mediastinal lymph node measures 2.6 cm by short axis immediately below the thyroid gland. Large right paratracheal/pretracheal lymph node measures 3.5 cm by short axis. Subcarinal lymph node measures 2.6 cm by short axis. Right hilar adenopathy appears to be confluent with the right upper lobe mass. Small lymph nodes along the right pericardiophrenic region and adjacent the distal esophagus. Lungs/Pleura: Examination demonstrates complete collapse of the right upper lobe with obstructed right upper lobe bronchus. Findings suggest a large right upper lobe lung mass with borders difficult to define, but measuring approximately 6.1 x 8.2 cm with minimal mass effect on the adjacent SVC and right upper lobe pulmonary artery. This favors primary bronchogenic carcinoma. This mass abuts the suprahilar region. There is loculated fluid over the right upper lobe/apex. There are numerous bilateral pulmonary nodules compatible with metastatic disease. Small amount of pleural fluid in the right base. Upper Abdomen: Numerous mesenteric, retroperitoneal and peritoneal masses compatible metastatic disease with the largest over the left  upper quadrant measuring 6.4 x 7.7 cm. Bilateral adrenal masses likely metastatic disease. Somewhat nodular wall thickening of the gallbladder as cannot exclude metastatic disease. Minimal calcified plaque over the abdominal aorta. Musculoskeletal: Curvature of the thoracic spine convex right. Degenerative change of the spine. IMPRESSION: Large right upper lobe mass extending towards the suprahilar region measuring approximately 6.1 x 8.2 cm concerning for primary bronchogenic carcinoma. This causes obstruction of the right upper lobe bronchus with mild mass effect on the SVC and right upper lobe pulmonary artery. Small associated right pleural effusion. Extensive associated mediastinal and right hilar adenopathy. Numerous bilateral pulmonary nodules compatible with metastatic disease. Numerous mesenteric, retroperitoneal and peritoneal nodules/masses compatible with metastatic disease with the largest over the left upper quadrant measuring 7.7 cm. Bilateral adrenal masses likely metastatic disease. Nodular wall thickening of the gallbladder suggesting metastatic disease. Minimal atherosclerotic coronary artery disease. Aortic Atherosclerosis (ICD10-I70.0). Electronically Signed   By: Marin Olp M.D.   On: 12/30/2017 10:14    Procedures .Critical Care Performed by: Lorayne Bender, PA-C Authorized by: Lorayne Bender, PA-C   Critical care provider statement:    Critical care time (minutes):  35   Critical care time was exclusive of:  Separately billable procedures and treating other patients   Critical care was necessary to treat or prevent imminent or life-threatening deterioration of the following conditions:  Respiratory failure   Critical care was time spent personally by me on the following activities:  Development of treatment plan with patient or surrogate, discussions with consultants, evaluation of patient's response to treatment, examination of patient, obtaining history from patient or surrogate,  pulse oximetry, re-evaluation of patient's condition, review of old charts, ordering and review of radiographic studies, ordering and review of laboratory studies and ordering and performing treatments and interventions   I assumed direction of critical care for this patient from another provider in my specialty: no     (including critical care time)  Medications Ordered in ED Medications  cefTRIAXone (ROCEPHIN) 2 g in sodium chloride 0.9 % 100 mL IVPB (0 g Intravenous Stopped 12/30/17 0847)  azithromycin (ZITHROMAX) 500 mg in sodium chloride  0.9 % 250 mL IVPB (500 mg Intravenous New Bag/Given 12/30/17 0854)  sodium chloride 0.9 % bolus 1,000 mL (0 mLs Intravenous Stopped 12/30/17 1001)    And  sodium chloride 0.9 % bolus 1,000 mL (has no administration in time range)  atorvastatin (LIPITOR) tablet 20 mg (has no administration in time range)  doxazosin (CARDURA) tablet 4 mg (has no administration in time range)  finasteride (PROSCAR) tablet 5 mg (has no administration in time range)  phenytoin (DILANTIN) ER capsule 300 mg (has no administration in time range)  tamsulosin (FLOMAX) capsule 0.4 mg (has no administration in time range)  warfarin (COUMADIN) tablet 2.5-5 mg (has no administration in time range)  ferrous sulfate tablet 325 mg (has no administration in time range)  HYDROcodone-acetaminophen (NORCO) 10-325 MG per tablet 1 tablet (has no administration in time range)  multivitamin with minerals tablet 1 tablet (has no administration in time range)  Polyethyl Glycol-Propyl Glycol 0.4-0.3 % SOLN 1 drop (has no administration in time range)  sodium chloride flush (NS) 0.9 % injection 3 mL (has no administration in time range)  sodium chloride flush (NS) 0.9 % injection 3 mL (has no administration in time range)  0.9 %  sodium chloride infusion (has no administration in time range)  0.9 % NaCl with KCl 20 mEq/ L  infusion (has no administration in time range)  acetaminophen (TYLENOL) tablet  650 mg (has no administration in time range)    Or  acetaminophen (TYLENOL) suppository 650 mg (has no administration in time range)  traZODone (DESYREL) tablet 25 mg (has no administration in time range)  ondansetron (ZOFRAN) tablet 4 mg (has no administration in time range)    Or  ondansetron (ZOFRAN) injection 4 mg (has no administration in time range)  iohexol (OMNIPAQUE) 300 MG/ML solution 75 mL (75 mLs Intravenous Contrast Given 12/30/17 0923)     Initial Impression / Assessment and Plan / ED Course  I have reviewed the triage vital signs and the nursing notes.  Pertinent labs & imaging results that were available during my care of the patient were reviewed by me and considered in my medical decision making (see chart for details).  Clinical Course as of Dec 30 1028  Sun Dec 30, 2017  0900 Spoke with Dr. Evangeline Gula, hospitalist. Will admit the patient.   [SJ]    Clinical Course User Index [SJ] Joy, Shawn C, PA-C      Patient presents with shortness of breath and cough with hypoxia to 88% at presentation, requiring 2 L supplemental O2.  Right-sided loculated pleural fluid noted on chest x-ray.  Chest CT better characterizes this as a large mass with mass-effect as noted above. The results of these imaging studies were discussed with the patient.  He was admitted for further management.  Findings and plan of care discussed with Sherwood Gambler, MD. Dr. Regenia Skeeter personally evaluated and examined this patient.  Vitals:   12/30/17 0858 12/30/17 0900 12/30/17 0915 12/30/17 1000  BP: (!) 127/53 (!) 120/48 (!) 108/49 (!) 107/51  Pulse:      Resp: 17 20 (!) 25 (!) 24  Temp:      TempSrc:      SpO2:  99%    Weight:      Height:          Final Clinical Impressions(s) / ED Diagnoses   Final diagnoses:  Shortness of breath  Pulmonary mass    ED Discharge Orders    None  Lorayne Bender, PA-C 12/30/17 1100    Sherwood Gambler, MD 01/01/18 1544

## 2017-12-30 NOTE — ED Triage Notes (Signed)
Pt BIB [facility] for cough, weakness, and shob for approx a week. Pt also has had intermittent chest pain and family states malodorous urine. Pt was 88% RA does not normally wear oxygen

## 2017-12-30 NOTE — H&P (Signed)
History and Physical    Jay Walters JJO:[id number] DOB: 05/01/1932 DOA: 12/30/2017  PCP: Elby Showers, MD   Patient coming from: Home  I have personally briefly reviewed patient's old medical records in [place]  Chief Complaint: Cough, weakness, shortness of breath and fever for 1 week.  HPI: Jay Walters is a 84 y.o. male with medical history significant of pretension, stroke, chronic back pain regard to cervical disc and lumbar disc myelopathy atrial fibrillation and flutter now in presents the emergency department with complaints of cough, weakness, shortness of breath and fever for approximately 1 week.  Denies any abdominal pain, chest pain, nausea vomiting diarrhea, hemoptysis, dizziness, diaphoresis, trauma, peripheral edema or other complaints.  He states that his cough is fairly wet without significant sputum production.  He also reports some associated weight loss of wife thinks approximately 12 pounds in the past week to 10 days.  He has had very poor appetite and has been unable to eat very much at all.  His shortness of breath has worsened to the point where when he presented to the emergency department his sats were 88%.  He does not use any home oxygen.  He has no prior history of smoking.  He does not have any occupational exposures to fumes, fibers, or known lung toxins.  Still complains of some malodorous urine.  In the emergency department his white blood cell count was noted to be 22,000, his lactate is elevated at 2 his BNP is also elevated.  Found to have a history of atrial flutter and an echocardiogram in 1601 showed diastolic dysfunction with an ejection fraction of 55 to 60% with a severely dilated right atrium.  Possibly low at 77/52 with respirations of 29 on presentation when I saw the patient he looks very comfortable.  He has received 2 L of fluid boluses in the emergency department as well as ceftriaxone and azithromycin.  His chest x-ray showed significant  abnormalities in the right upper lobe with a severe consolidation and possible pleural fluid.  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  Conversation with his wife she reports that the patient has recently seen his neurosurgeon due to pain across his shoulders and back that was thought due to his cervical disease.  He received steroid injections in his shoulder with no relief.  [facility] hospitalist were asked to admit.  Review of Systems: As per HPI otherwise all other systems reviewed and  negative.    Past Medical History:  Diagnosis Date  . Anemia   . Arthritis    "right hip; right knee; lower back ~ 1/2 way across"  . Atrial fibrillation ([place])    remotely   . Atrial flutter ([place])    typical appearing by ekg. s/p ablation 07/28/11  . Diverticulosis   . Dysphagia    with solids  . Esophageal stricture   . GERD (gastroesophageal reflux disease)   . H/O hiatal hernia   . Heart murmur   . Hemorrhoids   . High cholesterol   . Hypertension   . Osteopenia   . Seizures ([place])    "back w/migraine headaches; 1990's or before"  . Sinus bradycardia    asymptomatic  . SSS (sick sinus syndrome) ([place])   . Stroke [facility]) 2012   "left me weaker on left side; balance  is not good"    Past Surgical History:  Procedure Laterality Date  . ASD REPAIR  1972   Patch repair  . ATRIAL FLUTTER ABLATION N/A 07/28/2011   Procedure: ATRIAL FLUTTER ABLATION;  Surgeon: Thompson Grayer, MD;  Location: [facility] CATH LAB;  Service: Cardiovascular;  Laterality: N/A;  . CARDIAC ELECTROPHYSIOLOGY STUDY AND ABLATION  07/28/11  . CARDIOVASCULAR STRESS TEST  03/07/2010   Perfusion defect in the inferior myocardial region is consistent with diaphragmatic attenuation. No ischemia or infarct/scar is seen in the remaining myocardium. No ECG  changes.  . CAROTID DOPPLER  06/26/2011   Bilateral ICAs-demonstrated normal patency without eivdence of significant diameter reduction, dissection, or any other vascular abnormality.  . INGUINAL HERNIA REPAIR  2011   right  . JOINT REPLACEMENT     right knee replacement  . KNEE ARTHROSCOPY     right; "maybe twice"  . TOTAL KNEE ARTHROPLASTY  07/10/2012   Procedure: TOTAL KNEE ARTHROPLASTY;  Surgeon: Tobi Bastos, MD;  Location: [facility] ORS;  Service: Orthopedics;  Laterality: Right;  . TRANSTHORACIC ECHOCARDIOGRAM  12/10/2012   EF 55-60%. No regional wall motion abnormalities. LA moderately dialted.Mild-moderate regurg of the tricuspid valve.    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  Has had no significant occupational exposure to fumes, fibers, or dust.     reports that he has never smoked. He has never used smokeless tobacco. He reports that he drinks alcohol. He reports that he does not use drugs.  No Known Allergies  Family History  Problem Relation Age of Onset  . Heart disease Father   . Cancer Sister        unsure what type     Prior to Admission medications   Medication Sig Start Date End Date Taking? Authorizing Provider  acetaminophen (TYLENOL) 500 MG tablet Take 1,000 mg by mouth every 6 (six) hours as needed for mild pain or moderate pain.   Yes [provider]  atorvastatin (LIPITOR) 20 MG tablet TAKE 1 TABLET DAILY 12/12/17  Yes Baxley, Cresenciano Lick, MD  doxazosin (CARDURA) 4 MG tablet TAKE 1 TABLET DAILY 12/07/16  Yes Baxley, Cresenciano Lick, MD  ferrous sulfate 325 (65 FE) MG tablet Take 325 mg by mouth 2 (two) times daily after a meal.    Yes [provider]  finasteride (PROSCAR) 5 MG tablet Take 1 tablet (5 mg total) by mouth daily. 12/07/16  Yes Baxley, Cresenciano Lick, MD  furosemide (LASIX) 40 MG tablet Take 1 tablet (40 mg total) by mouth as needed. 10/25/17  Yes Baxley, Cresenciano Lick, MD    HYDROcodone-acetaminophen (NORCO) 10-325 MG tablet Take 1 tablet by mouth as needed. 11/13/17  Yes [provider]  Multiple Vitamin (MULTIVITAMIN WITH MINERALS) TABS tablet Take 1 tablet by mouth daily.   Yes [provider]  phenytoin (DILANTIN) 100 MG ER capsule TAKE 3 CAPSULES AT BEDTIME 12/12/17  Yes Baxley, Cresenciano Lick, MD  Polyethyl Glycol-Propyl Glycol (SYSTANE ULTRA) 0.4-0.3 % SOLN Place 1 drop into both eyes daily.   Yes [provider]  tamsulosin (FLOMAX) 0.4 MG CAPS capsule Take 1 capsule (0.4 mg total) by mouth 2 (two) times daily. 12/07/16  Yes Baxley, Cresenciano Lick, MD  warfarin (COUMADIN) 5 MG tablet TAKE ONE-HALF (1/2) TO ONE TABLET DAILY AS DIRECTED BY COUMADIN CLINIC. NEED INR APPOINTMENT PRIOR TO NEXT REFILL AUTHORIZATION. Patient taking differently: Take 2.5-5 mg  by mouth one time only at 6 PM. Take 2.5 mg on Tuesday, thursday, Sunday 5 mg Monday, wedsesday, Friday, saturday 11/20/17  Yes Hilty, Nadean Corwin, MD  cefPROZIL (CEFZIL) 500 MG tablet Take 1 tablet (500 mg total) by mouth 2 (two) times daily. For scrotum infection Patient not taking: Reported on 12/30/2017 10/25/17   Elby Showers, MD  gabapentin (NEURONTIN) 300 MG capsule 2 po qhs for chronic pain Patient not taking: Reported on 12/30/2017 10/25/17   Elby Showers, MD  levothyroxine (SYNTHROID, LEVOTHROID) 50 MCG tablet Take 1 tablet (50 mcg total) by mouth daily. Patient not taking: Reported on 12/30/2017 07/08/15   Elby Showers, MD  predniSONE (DELTASONE) 5 MG tablet Take 1 tablet (5 mg total) by mouth daily with breakfast. Patient not taking: Reported on 12/30/2017 10/25/17   Elby Showers, MD    Physical Exam:  Constitutional: NAD, calm, comfortable, frail thin ill-appearing Vitals:   12/30/17 0858 12/30/17 0900 12/30/17 0915 12/30/17 1000  BP: (!) 127/53 (!) 120/48 (!) 108/49 (!) 107/51  Pulse:      Resp: 17 20 (!) 25 (!) 24  Temp:      TempSrc:      SpO2:  99%    Weight:      Height:        Eyes: PERRL, lids and conjunctivae normal ENMT: Mucous membranes are moist. Posterior pharynx clear of any exudate or lesions.Normal dentition.  Neck: normal, supple, no masses, no thyromegaly Respiratory: Dullness to percussion in the right upper lobe, no breath sounds appreciated in the right upper lobe good air movement otherwise throughout., no wheezing, no crackles. Normal respiratory effort. No accessory muscle use.  Cardiovascular: Regular rate and rhythm, no murmurs / rubs / gallops. No extremity edema. 2+ pedal pulses. No carotid bruits.  Abdomen: no tenderness, no masses palpated. No hepatosplenomegaly. Bowel sounds positive.  Thin scaphoid abdomen Musculoskeletal: no clubbing / cyanosis. No joint deformity upper and lower extremities. Good ROM, no contractures. Normal muscle tone.  Skin: no rashes, lesions, ulcers. No induration Neurologic: CN 2-12 grossly intact. Sensation intact, DTR normal. Strength 5/5 in all 4.  Psychiatric: Normal judgment and insight. Alert and oriented x 3. Normal mood.   Labs on Admission: I have personally reviewed following labs and imaging studies  CBC: Recent Labs  Lab 12/30/17 0659  WBC 22.9*  NEUTROABS 18.0*  HGB 13.2  HCT 41.7  MCV 96.5  PLT 295   Basic Metabolic Panel: Recent Labs  Lab 12/30/17 0659  NA 143  K 3.7  CL 110  CO2 20*  GLUCOSE 117*  BUN 28*  CREATININE 1.23  CALCIUM 8.2*   GFR: Estimated Creatinine Clearance: 38.1 mL/min (by C-G formula based on SCr of 1.23 mg/dL). Liver Function Tests: Recent Labs  Lab 12/30/17 0659  AST 16  ALT 15  ALKPHOS 67  BILITOT 0.9  PROT 5.2*  ALBUMIN 2.1*   Urine analysis:    Component Value Date/Time   COLORURINE ORANGE (A) 07/28/2012 1239   APPEARANCEUR CLOUDY (A) 07/28/2012 1239   LABSPEC 1.022 07/28/2012 1239   PHURINE 6.5 07/28/2012 1239   GLUCOSEU NEGATIVE 07/28/2012 1239   HGBUR NEGATIVE 07/28/2012 1239   BILIRUBINUR neg 08/21/2014 1522   KETONESUR 15 (A)  07/28/2012 1239   PROTEINUR trace 08/21/2014 1522   PROTEINUR NEGATIVE 07/28/2012 1239   UROBILINOGEN negative 08/21/2014 1522   UROBILINOGEN 2.0 (H) 07/28/2012 1239   NITRITE neg 08/21/2014 1522   NITRITE POSITIVE (A) 07/28/2012 1239  LEUKOCYTESUR Negative 08/21/2014 1522    Radiological Exams on Admission: Dg Chest 2 View  Result Date: 12/30/2017 CLINICAL DATA:  Shortness of breath several weeks. EXAM: CHEST - 2 VIEW COMPARISON:  02/02/2015 FINDINGS: New confluent consolidation over the right upper lobe with suggestion of pleural fluid over the right apex. Mild tracheal deviation to the right. Left lung is clear. Cardiac silhouette is within normal. Remainder the exam is unchanged. IMPRESSION: New right upper lobe collapse with possible loculated right apical pleural fluid. Recommend chest CT with contrast for further evaluation to exclude central obstructing lesion. Electronically Signed   By: Marin Olp M.D.   On: 12/30/2017 07:42   Ct Chest W Contrast  Result Date: 12/30/2017 CLINICAL DATA:  Chest pain and shortness-of-breath. EXAM: CT CHEST WITH CONTRAST TECHNIQUE: Multidetector CT imaging of the chest was performed during intravenous contrast administration. CONTRAST:  75mL OMNIPAQUE IOHEXOL 300 MG/ML  SOLN COMPARISON:  Chest x-ray today as well as chest CT 04/16/2012 FINDINGS: Cardiovascular: Heart is normal size. Calcified plaque over the left anterior descending coronary artery. Minimal calcified plaque over the thoracic aorta. Prominence of the main pulmonary arteries. No evidence of pulmonary embolism. Mild compression of the SVC due to the right upper lobe lung mass as well as mediastinal adenopathy. Right upper lobe pulmonary artery is also mildly compressed by the adjacent adenopathy and right upper lobe mass. Mediastinum/Nodes: Extensive mediastinal adenopathy. Right superior mediastinal lymph node measures 2.6 cm by short axis immediately below the thyroid gland. Large right  paratracheal/pretracheal lymph node measures 3.5 cm by short axis. Subcarinal lymph node measures 2.6 cm by short axis. Right hilar adenopathy appears to be confluent with the right upper lobe mass. Small lymph nodes along the right pericardiophrenic region and adjacent the distal esophagus. Lungs/Pleura: Examination demonstrates complete collapse of the right upper lobe with obstructed right upper lobe bronchus. Findings suggest a large right upper lobe lung mass with borders difficult to define, but measuring approximately 6.1 x 8.2 cm with minimal mass effect on the adjacent SVC and right upper lobe pulmonary artery. This favors primary bronchogenic carcinoma. This mass abuts the suprahilar region. There is loculated fluid over the right upper lobe/apex. There are numerous bilateral pulmonary nodules compatible with metastatic disease. Small amount of pleural fluid in the right base. Upper Abdomen: Numerous mesenteric, retroperitoneal and peritoneal masses compatible metastatic disease with the largest over the left upper quadrant measuring 6.4 x 7.7 cm. Bilateral adrenal masses likely metastatic disease. Somewhat nodular wall thickening of the gallbladder as cannot exclude metastatic disease. Minimal calcified plaque over the abdominal aorta. Musculoskeletal: Curvature of the thoracic spine convex right. Degenerative change of the spine. IMPRESSION: Large right upper lobe mass extending towards the suprahilar region measuring approximately 6.1 x 8.2 cm concerning for primary bronchogenic carcinoma. This causes obstruction of the right upper lobe bronchus with mild mass effect on the SVC and right upper lobe pulmonary artery. Small associated right pleural effusion. Extensive associated mediastinal and right hilar adenopathy. Numerous bilateral pulmonary nodules compatible with metastatic disease. Numerous mesenteric, retroperitoneal and peritoneal nodules/masses compatible with metastatic disease with the  largest over the left upper quadrant measuring 7.7 cm. Bilateral adrenal masses likely metastatic disease. Nodular wall thickening of the gallbladder suggesting metastatic disease. Minimal atherosclerotic coronary artery disease. Aortic Atherosclerosis (ICD10-I70.0). Electronically Signed   By: Marin Olp M.D.   On: 12/30/2017 10:14    EKG: Independently reviewed.  Sinus rhythm with apical premature complexes, increased PR interval, slightly increased QT with no  change compared to 2015.  Assessment/Plan Principal Problem:   Bronchogenic carcinoma of right lung (HCC) Active Problems:   Acute respiratory insufficiency   Pneumonia of upper lobe due to infectious organism [facility])   Metastatic lung cancer (metastasis from lung to other site) [facility])   Atrial flutter (HCC)   Cervical disc disease with myelopathy   Long term current use of anticoagulant therapy   Chronic diastolic heart failure (HCC)   Cerebral arteriosclerosis with history of previous stroke   1.  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  Patient will need a biopsy.  I have spoken to pulmonary who will see the patient for planning a bronchoscopic biopsy.  Patient will need referral to oncology once this is complete possibly as an outpatient.  He will also need an abdomen pelvis CT scan with contrast as well as a CT scan of the head with contrast.  He is already received contrast today so will plan those for future once oncology is involved.  2.  Acute respiratory insufficiency: Patient doing better on 2 L nasal cannula his initial sats were 88%.  We will continue to monitor and provide oxygen as necessary line   3.  Pneumonia of the upper lobe due to infectious organism: Likely this is a postobstructive pneumonia associated with this lung tumor which is fairly large.  He has an elevated white blood cell count and an elevated lactate.  He has  been given Rocephin and azithromycin in the emergency department and we will continue this as an inpatient.  We will check a sputum culture blood cultures are ordered and pending.  4.  Atrial flutter: Patient has had an ablative procedure in the past.  He is currently on warfarin he is not requiring any rate control medication.  EKG shows a sinus rhythm.  5.  Cervical disc disease with myelopathy: Patient has well described cervical and lumbar back problems he is on chronic pain medications which will be continued.  6.  Long-term current use of anticoagulant therapy: Patient on warfarin pharmacy to consult.  7.  Chronic diastolic heart failure: Given that the patient will likely be requiring more chemotherapy will check an echocardiogram.  His previous echocardiogram is as described in HPI.  8.  Cerebral arterial sclerosis with a history of previous stroke: Noted.  And warfarin.   DVT prophylaxis: Warfarin Code Status: Full code Family Communication: Patient and his wife who is present at the time of admission. Disposition Plan: Likely home in 4 days Consults called: Dr. Lamonte Sakai from pulmonary Admission status: Inpatient   Lady Deutscher MD [facility] Pager [phone]  If 7PM-7AM, please contact night-coverage [email or web address] Password [facility]  12/30/2017, 10:59 AM

## 2017-12-30 NOTE — Care Management (Signed)
INR is greater than 10. Will give vitamin k 10 mg po times 1

## 2017-12-30 NOTE — Progress Notes (Addendum)
ANTICOAGULATION CONSULT NOTE - Initial Consult  Pharmacy Consult for Warfarin Indication: atrial fibrillation  No Known Allergies  Patient Measurements: Height: 5\' 8"  (172.7 cm) Weight: 133 lb (60.3 kg) IBW/kg (Calculated) : 68.4 Heparin Dosing Weight: n/a  Vital Signs: Temp: 98.1 F (36.7 C) (07/21 1110) Temp Source: Oral (07/21 1110) BP: 91/58 (07/21 1112) Pulse Rate: 70 (07/21 1112)  Labs: Recent Labs    12/30/17 0659 12/30/17 1024  HGB 13.2  --   HCT 41.7  --   PLT 287  --   LABPROT  --  >90.0*  INR  --  >10.00*  CREATININE 1.23  --     Estimated Creatinine Clearance: 38.1 mL/min (by C-G formula based on SCr of 1.23 mg/dL).   Medical History: Past Medical History:  Diagnosis Date  . Anemia   . Arthritis    "right hip; right knee; lower back ~ 1/2 way across"  . Atrial fibrillation ([place])    remotely   . Atrial flutter ([place])    typical appearing by ekg. s/p ablation 07/28/11  . Diverticulosis   . Dysphagia    with solids  . Esophageal stricture   . GERD (gastroesophageal reflux disease)   . H/O hiatal hernia   . Heart murmur   . Hemorrhoids   . High cholesterol   . Hypertension   . Osteopenia   . Seizures ([place])    "back w/migraine headaches; 1990's or before"  . Sinus bradycardia    asymptomatic  . SSS (sick sinus syndrome) ([place])   . Stroke [facility]) 2012   "left me weaker on left side; balance is not good"    Medications:  Scheduled:  . atorvastatin  20 mg Oral q1800  . doxazosin  4 mg Oral Daily  . feeding supplement (ENSURE ENLIVE)  237 mL Oral BID BM  . ferrous sulfate  325 mg Oral BID PC  . finasteride  5 mg Oral Daily  . multivitamin with minerals  1 tablet Oral Daily  . phenytoin  300 mg Oral QHS  . phytonadione  10 mg Oral Once  . sodium chloride flush  3 mL Intravenous Q12H  . tamsulosin  0.4 mg Oral BID    Assessment: 15 YOM with PMH of HTN, stroke, and afib on warfarin PTA who presented on 7/21 with cough and SOB. PTA warfarin  dose is 5 mg daily except 2.5 mg TTSat. Pt reports last dose of warfarin taken was 5 mg on 7/20.  On admission, INR >10. No documented s/sx of bleeding. CBC within normal limits and stable.  Goal of Therapy:  INR 2-3 Monitor platelets by anticoagulation protocol: Yes   Plan:  Hold warfarin  Check daily INR Monitor for s/sx of bleeding Consider ordering Vitamin K  Addendum: MD ordered vit K 10 mg PO x1. Will plan to start IV heparin once INR <2.   Jackson Latino, PharmD PGY1 Pharmacy Resident Phone [phone] 12/30/2017     1:55 PM

## 2017-12-30 NOTE — ED Notes (Signed)
Patient transported to X-ray 

## 2017-12-30 NOTE — Progress Notes (Addendum)
ANTICOAGULATION CONSULT NOTE - Initial Consult  Pharmacy Consult for Warfarin Indication: atrial fibrillation  No Known Allergies  Patient Measurements: Height: 5\' 8"  (172.7 cm) Weight: 133 lb (60.3 kg) IBW/kg (Calculated) : 68.4 Heparin Dosing Weight: n/a  Vital Signs: Temp: 98.1 F (36.7 C) (07/21 1110) Temp Source: Oral (07/21 1110) BP: 91/58 (07/21 1112) Pulse Rate: 70 (07/21 1112)  Labs: Recent Labs    12/30/17 0659 12/30/17 1024  HGB 13.2  --   HCT 41.7  --   PLT 287  --   LABPROT  --  >90.0*  INR  --  >10.00*  CREATININE 1.23  --     Estimated Creatinine Clearance: 38.1 mL/min (by C-G formula based on SCr of 1.23 mg/dL).   Medical History: Past Medical History:  Diagnosis Date  . Anemia   . Arthritis    "right hip; right knee; lower back ~ 1/2 way across"  . Atrial fibrillation ([place])    remotely   . Atrial flutter ([place])    typical appearing by ekg. s/p ablation 07/28/11  . Diverticulosis   . Dysphagia    with solids  . Esophageal stricture   . GERD (gastroesophageal reflux disease)   . H/O hiatal hernia   . Heart murmur   . Hemorrhoids   . High cholesterol   . Hypertension   . Osteopenia   . Seizures ([place])    "back w/migraine headaches; 1990's or before"  . Sinus bradycardia    asymptomatic  . SSS (sick sinus syndrome) ([place])   . Stroke [facility]) 2012   "left me weaker on left side; balance is not good"    Medications:  Scheduled:  . atorvastatin  20 mg Oral q1800  . doxazosin  4 mg Oral Daily  . ferrous sulfate  325 mg Oral BID PC  . finasteride  5 mg Oral Daily  . multivitamin with minerals  1 tablet Oral Daily  . phenytoin  300 mg Oral QHS  . sodium chloride flush  3 mL Intravenous Q12H  . tamsulosin  0.4 mg Oral BID    Assessment: 32 YOM with PMH of HTN, stroke, and afib on warfarin PTA who presented on 7/21 with cough and SOB. PTA warfarin dose is 5 mg daily except 2.5 mg TTSat. Pt reports last dose of warfarin taken was 5 mg on  7/20.  On admission, INR >10. No documented s/sx of bleeding. CBC within normal limits and stable.  Goal of Therapy:  INR 2-3 Monitor platelets by anticoagulation protocol: Yes   Plan:  Hold warfarin  Check daily INR Monitor for s/sx of bleeding Consider ordering Vitamin K   Jackson Latino, PharmD PGY1 Pharmacy Resident Phone [phone] 12/30/2017     12:50 PM

## 2017-12-30 NOTE — Consult Note (Addendum)
Name: Jay Walters MRN: [id number] DOB: [birth year]    ADMISSION DATE:  12/30/2017 CONSULTATION DATE:  7/21  REFERRING MD :  Evangeline Gula  CHIEF COMPLAINT:  Lung mass  BRIEF PATIENT DESCRIPTION/HPI:  This is an 84 year old male patient with a history of hypertension, prior stroke, prior back pain in setting of cervical and lumbar disc disease, atrial fibrillation and flutter.  He presented to the emergency room on 7/21 with chief complaint of cough, weakness, and shortness of breath which is persisted over approximately 1 week.Marland Kitchen  On presentation he denied chest pain, nausea vomiting or abdominal discomfort.  He denies worsening peripheral edema.  He reported the cough is coarse and wet, but ultimately nonproductive.  He has had weight loss over about 10 days estimating somewhere around 10 pounds, his appetite has been poor.  On arrival in the emergency room his saturations were 88%.  He is a non-smoker.  In the ER his white blood cell count was noted at 22.  His blood pressure initially was 77/52, however he responded favorably to IV fluid resuscitation a CT of chest was obtained this demonstrated : Large right upper lobe mass extending suprahilar region measuring measuring 6.1 x 8.2 cm with obstruction of the right upper lobe bronchus and mass-effect on the superior vena cava there is a very small right pleural effusion there are numerous bilateral pulmonary nodules.  There are also numerous mesenteric and retroperitoneal as well as peritoneal nodules and masses the largest over the left upper quadrant.  He also had bilateral adrenal masses.  He was admitted with a working diagnosis of postobstructive pneumonia in the setting of new right upper lobe lung mass and sepsis.  Pulmonary is been asked to see in regards to his lung mass  SIGNIFICANT EVENTS  7/21 admitted  STUDIES:  CT of chest: Demonstrates a large right upper lobe lung mass which extends suprahilar region and measures 6.1 x 8.2 cm it causes  obstruction of the right upper lobe bronchus as well as mild mass-effect of the SVC and right upper lobe pulmonary artery he has a very small associated right pleural effusion there is extensive mediastinal and right hilar adenopathy numerous bilateral pulmonary nodules.  Numerous mesenteric retroperitoneal and peritoneal nodule masses also bilateral adrenal masses     PAST MEDICAL HISTORY :   has a past medical history of Anemia, Arthritis, Atrial fibrillation ([place]), Atrial flutter ([place]), Diverticulosis, Dysphagia, Esophageal stricture, GERD (gastroesophageal reflux disease), H/O hiatal hernia, Heart murmur, Hemorrhoids, High cholesterol, Hypertension, Osteopenia, Seizures ([place]), Sinus bradycardia, SSS (sick sinus syndrome) ([place]), and Stroke ([place]) (2012).  has a past surgical history that includes ASD repair (1972); Knee arthroscopy; Inguinal hernia repair (2011); Cardiac electrophysiology study and ablation (07/28/11); Total knee arthroplasty (07/10/2012); Joint replacement; CAROTID DOPPLER (06/26/2011); Cardiovascular stress test (03/07/2010); transthoracic echocardiogram (12/10/2012); and Atrial flutter ablation (N/A, 07/28/2011). Prior to Admission medications   Medication Sig Start Date End Date Taking? Authorizing Provider  acetaminophen (TYLENOL) 500 MG tablet Take 1,000 mg by mouth every 6 (six) hours as needed for mild pain or moderate pain.   Yes [provider]  atorvastatin (LIPITOR) 20 MG tablet TAKE 1 TABLET DAILY 12/12/17  Yes Baxley, Cresenciano Lick, MD  doxazosin (CARDURA) 4 MG tablet TAKE 1 TABLET DAILY 12/07/16  Yes Baxley, Cresenciano Lick, MD  ferrous sulfate 325 (65 FE) MG tablet Take 325 mg by mouth 2 (two) times daily after a meal.    Yes [provider]  finasteride (PROSCAR) 5 MG tablet Take  1 tablet (5 mg total) by mouth daily. 12/07/16  Yes Baxley, Cresenciano Lick, MD  furosemide (LASIX) 40 MG tablet Take 1 tablet (40 mg total) by mouth as needed. 10/25/17  Yes Baxley, Cresenciano Lick, MD    HYDROcodone-acetaminophen (NORCO) 10-325 MG tablet Take 1 tablet by mouth as needed. 11/13/17  Yes [provider]  Multiple Vitamin (MULTIVITAMIN WITH MINERALS) TABS tablet Take 1 tablet by mouth daily.   Yes [provider]  phenytoin (DILANTIN) 100 MG ER capsule TAKE 3 CAPSULES AT BEDTIME 12/12/17  Yes Baxley, Cresenciano Lick, MD  Polyethyl Glycol-Propyl Glycol (SYSTANE ULTRA) 0.4-0.3 % SOLN Place 1 drop into both eyes daily.   Yes [provider]  tamsulosin (FLOMAX) 0.4 MG CAPS capsule Take 1 capsule (0.4 mg total) by mouth 2 (two) times daily. 12/07/16  Yes Baxley, Cresenciano Lick, MD  warfarin (COUMADIN) 5 MG tablet TAKE ONE-HALF (1/2) TO ONE TABLET DAILY AS DIRECTED BY COUMADIN CLINIC. NEED INR APPOINTMENT PRIOR TO NEXT REFILL AUTHORIZATION. Patient taking differently: Take 2.5-5 mg by mouth one time only at 6 PM. Take 2.5 mg on Tuesday, thursday, Sunday 5 mg Monday, wedsesday, Friday, saturday 11/20/17  Yes Hilty, Nadean Corwin, MD   No Known Allergies  FAMILY HISTORY:  family history includes Cancer in his sister; Heart disease in his father. SOCIAL HISTORY:  reports that he has never smoked. He has never used smokeless tobacco. He reports that he drinks alcohol. He reports that he does not use drugs.  REVIEW OF SYSTEMS:   Constitutional: Negative for fever, chills, weight loss, malaise/fatigue and diaphoresis.  HENT: Negative for hearing loss, ear pain, nosebleeds, congestion, sore throat, neck pain, tinnitus and ear discharge.   Eyes: Negative for blurred vision, double vision, photophobia, pain, discharge and redness.  Respiratory: non-productive cough, no hemoptysis, co sputum production, shortness of breath, wheezing and stridor.   Cardiovascular: Negative for chest pain, palpitations, orthopnea, claudication, leg swelling and PND.  Gastrointestinal: Negative for heartburn, nausea, vomiting, abdominal pain, diarrhea, constipation, blood in stool and melena.  Genitourinary:  Negative for dysuria, urgency, frequency, hematuria and flank pain.  Musculoskeletal: Negative for myalgias, back pain, joint pain and falls.  Skin: Negative for itching and rash.  Neurological: Negative for dizziness, tingling, tremors, sensory change, speech change, focal weakness, seizures, loss of consciousness, weakness and headaches.  Endo/Heme/Allergies: Negative for environmental allergies and polydipsia. Does not bruise/bleed easily.  SUBJECTIVE:  Feels a little better since arriving in the hospital VITAL SIGNS: Temp:  [97.9 F (36.6 C)-98.1 F (36.7 C)] 98.1 F (36.7 C) (07/21 1110) Pulse Rate:  [67-75] 70 (07/21 1112) Resp:  [17-29] 20 (07/21 1110) BP: (77-127)/(48-58) 91/58 (07/21 1112) SpO2:  [98 %-100 %] 98 % (07/21 1112) Weight:  [133 lb (60.3 kg)] 133 lb (60.3 kg) (07/21 0845)  PHYSICAL EXAMINATION: General: Frail 84 year old African-American male currently resting in bed he is in no acute distress Neuro: Awake oriented no focal deficits equal strength HEENT: Temporal wasting, some prominent neck veins noted on the right, mucous membranes moist Cardiovascular: Regular irregular Lungs: Diminished on the right, scattered rhonchi, no accessory use Abdomen: Soft nontender Musculoskeletal: Equal strength and bulk Skin: Warm and dry without breakdown  Recent Labs  Lab 12/30/17 0659  NA 143  K 3.7  CL 110  CO2 20*  BUN 28*  CREATININE 1.23  GLUCOSE 117*   Recent Labs  Lab 12/30/17 0659  HGB 13.2  HCT 41.7  WBC 22.9*  PLT 287   Dg Chest 2 View  Result Date: 12/30/2017 CLINICAL DATA:  Shortness of breath several weeks. EXAM: CHEST - 2 VIEW COMPARISON:  02/02/2015 FINDINGS: New confluent consolidation over the right upper lobe with suggestion of pleural fluid over the right apex. Mild tracheal deviation to the right. Left lung is clear. Cardiac silhouette is within normal. Remainder the exam is unchanged. IMPRESSION: New right upper lobe collapse with possible  loculated right apical pleural fluid. Recommend chest CT with contrast for further evaluation to exclude central obstructing lesion. Electronically Signed   By: Marin Olp M.D.   On: 12/30/2017 07:42   Ct Chest W Contrast  Result Date: 12/30/2017 CLINICAL DATA:  Chest pain and shortness-of-breath. EXAM: CT CHEST WITH CONTRAST TECHNIQUE: Multidetector CT imaging of the chest was performed during intravenous contrast administration. CONTRAST:  63mL OMNIPAQUE IOHEXOL 300 MG/ML  SOLN COMPARISON:  Chest x-ray today as well as chest CT 04/16/2012 FINDINGS: Cardiovascular: Heart is normal size. Calcified plaque over the left anterior descending coronary artery. Minimal calcified plaque over the thoracic aorta. Prominence of the main pulmonary arteries. No evidence of pulmonary embolism. Mild compression of the SVC due to the right upper lobe lung mass as well as mediastinal adenopathy. Right upper lobe pulmonary artery is also mildly compressed by the adjacent adenopathy and right upper lobe mass. Mediastinum/Nodes: Extensive mediastinal adenopathy. Right superior mediastinal lymph node measures 2.6 cm by short axis immediately below the thyroid gland. Large right paratracheal/pretracheal lymph node measures 3.5 cm by short axis. Subcarinal lymph node measures 2.6 cm by short axis. Right hilar adenopathy appears to be confluent with the right upper lobe mass. Small lymph nodes along the right pericardiophrenic region and adjacent the distal esophagus. Lungs/Pleura: Examination demonstrates complete collapse of the right upper lobe with obstructed right upper lobe bronchus. Findings suggest a large right upper lobe lung mass with borders difficult to define, but measuring approximately 6.1 x 8.2 cm with minimal mass effect on the adjacent SVC and right upper lobe pulmonary artery. This favors primary bronchogenic carcinoma. This mass abuts the suprahilar region. There is loculated fluid over the right upper  lobe/apex. There are numerous bilateral pulmonary nodules compatible with metastatic disease. Small amount of pleural fluid in the right base. Upper Abdomen: Numerous mesenteric, retroperitoneal and peritoneal masses compatible metastatic disease with the largest over the left upper quadrant measuring 6.4 x 7.7 cm. Bilateral adrenal masses likely metastatic disease. Somewhat nodular wall thickening of the gallbladder as cannot exclude metastatic disease. Minimal calcified plaque over the abdominal aorta. Musculoskeletal: Curvature of the thoracic spine convex right. Degenerative change of the spine. IMPRESSION: Large right upper lobe mass extending towards the suprahilar region measuring approximately 6.1 x 8.2 cm concerning for primary bronchogenic carcinoma. This causes obstruction of the right upper lobe bronchus with mild mass effect on the SVC and right upper lobe pulmonary artery. Small associated right pleural effusion. Extensive associated mediastinal and right hilar adenopathy. Numerous bilateral pulmonary nodules compatible with metastatic disease. Numerous mesenteric, retroperitoneal and peritoneal nodules/masses compatible with metastatic disease with the largest over the left upper quadrant measuring 7.7 cm. Bilateral adrenal masses likely metastatic disease. Nodular wall thickening of the gallbladder suggesting metastatic disease. Minimal atherosclerotic coronary artery disease. Aortic Atherosclerosis (ICD10-I70.0). Electronically Signed   By: Marin Olp M.D.   On: 12/30/2017 10:14    ASSESSMENT / PLAN:  Large right upper lobe lung mass with postobstructive atelectasis versus pneumonia Small right effusion Multiple pulmonary nodules Multiple mesenteric retroperitoneal masses Bilateral adrenal masses Sepsis AF Azotemia Chronic back pain  Discussion Highly concerning for advanced cancer with diffuse metastasis.  Agree currently with treating for postobstructive process.   Plan   will need tissue sampling, will discuss with Dr. Lamonte Sakai would be easy to get a tissue sample by either CT-guided needle biopsy or bronchoscopy.  However with peritoneal and adrenal masses as well;  may be best to first biopsy one of these as would give [place] better staging data.  I will discuss this with Dr. Lamonte Sakai Continue current antibiotics   Erick Colace ACNP-BC [place] Pager # [phone] OR # [phone] if no answer  12/30/2017, 11:30 AM

## 2017-12-30 NOTE — ED Notes (Signed)
Patient transported to CT 

## 2017-12-31 ENCOUNTER — Inpatient Hospital Stay (HOSPITAL_COMMUNITY): Payer: 59

## 2017-12-31 DIAGNOSIS — E43 Unspecified severe protein-calorie malnutrition: Secondary | ICD-10-CM

## 2017-12-31 DIAGNOSIS — I484 Atypical atrial flutter: Secondary | ICD-10-CM

## 2017-12-31 DIAGNOSIS — R0600 Dyspnea, unspecified: Secondary | ICD-10-CM

## 2017-12-31 LAB — BLOOD CULTURE ID PANEL (REFLEXED)
Acinetobacter baumannii: NOT DETECTED
CANDIDA ALBICANS: NOT DETECTED
CANDIDA GLABRATA: NOT DETECTED
Candida krusei: NOT DETECTED
Candida parapsilosis: NOT DETECTED
Candida tropicalis: NOT DETECTED
ENTEROBACTER CLOACAE COMPLEX: NOT DETECTED
ENTEROBACTERIACEAE SPECIES: NOT DETECTED
ENTEROCOCCUS SPECIES: NOT DETECTED
Escherichia coli: NOT DETECTED
Haemophilus influenzae: NOT DETECTED
Klebsiella oxytoca: NOT DETECTED
Klebsiella pneumoniae: NOT DETECTED
LISTERIA MONOCYTOGENES: NOT DETECTED
Methicillin resistance: NOT DETECTED
NEISSERIA MENINGITIDIS: NOT DETECTED
PSEUDOMONAS AERUGINOSA: NOT DETECTED
Proteus species: NOT DETECTED
STREPTOCOCCUS PNEUMONIAE: NOT DETECTED
STREPTOCOCCUS PYOGENES: NOT DETECTED
STREPTOCOCCUS SPECIES: NOT DETECTED
Serratia marcescens: NOT DETECTED
Staphylococcus aureus (BCID): NOT DETECTED
Staphylococcus species: DETECTED — AB
Streptococcus agalactiae: NOT DETECTED

## 2017-12-31 LAB — BASIC METABOLIC PANEL
ANION GAP: 8 (ref 5–15)
BUN: 28 mg/dL — ABNORMAL HIGH (ref 8–23)
CHLORIDE: 112 mmol/L — AB (ref 98–111)
CO2: 22 mmol/L (ref 22–32)
Calcium: 8.3 mg/dL — ABNORMAL LOW (ref 8.9–10.3)
Creatinine, Ser: 0.97 mg/dL (ref 0.61–1.24)
GFR calc Af Amer: >60 mL/min (ref >60)
GLUCOSE: 142 mg/dL — AB (ref 70–99)
POTASSIUM: 3.6 mmol/L (ref 3.5–5.1)
SODIUM: 142 mmol/L (ref 135–145)

## 2017-12-31 LAB — ECHOCARDIOGRAM COMPLETE
Height: 68 in
WEIGHTICAEL: 2128 [oz_av]

## 2017-12-31 LAB — CBC
HEMATOCRIT: 38.1 % — AB (ref 39.0–52.0)
HEMOGLOBIN: 12.4 g/dL — AB (ref 13.0–17.0)
MCH: 30.6 pg (ref 26.0–34.0)
MCHC: 32.5 g/dL (ref 30.0–36.0)
MCV: 94.1 fL (ref 78.0–100.0)
Platelets: 232 10*3/uL (ref 150–400)
RBC: 4.05 MIL/uL — ABNORMAL LOW (ref 4.22–5.81)
RDW: 15.5 % (ref 11.5–15.5)
WBC: 24.6 10*3/uL — AB (ref 4.0–10.5)

## 2017-12-31 LAB — PROTIME-INR
INR: 3.87
Prothrombin Time: 37.7 seconds — ABNORMAL HIGH (ref 11.4–15.2)

## 2017-12-31 NOTE — Progress Notes (Signed)
  Echocardiogram 2D Echocardiogram has been performed.  Jay Walters 12/31/2017, 10:20 AM

## 2017-12-31 NOTE — Progress Notes (Signed)
Initial Nutrition Assessment  DOCUMENTATION CODES:   Severe malnutrition in context of chronic illness  INTERVENTION:    Ensure Enlive po BID, each supplement provides 350 kcal and 20 grams of protein  NUTRITION DIAGNOSIS:   Severe Malnutrition related to chronic illness(bronchogenic cancer with metastases) as evidenced by severe fat depletion, severe muscle depletion and severe weight loss of 10% x 2 months  GOAL:   Patient will meet greater than or equal to 90% of their needs  MONITOR:   PO intake, Supplement acceptance, Labs, Skin, Weight trends, I & O's  REASON FOR ASSESSMENT:   Malnutrition Screening Tool  ASSESSMENT:   84 yo Male with PMH significant of HTN, stroke, chronic back pain regard to cervical disc and lumbar disc myelopathy atrial fibrillation and flutter; presented to the ED with complaints of cough, weakness, shortness of breath and fever for approximately 1 week.   Pt admitted for Shortness of breath [R06.02] Pulmonary mass [R91.8]   CT scan was consistent with a right upper lobe bronchogenic tumor with metastases.  RD unable to obtain nutrition hx. No family present. Pt's responses to RD interview questions are unintelligible.  No % PO intake records available per flowsheets.  Per H&P pt has had a 12 lb weight loss in the last week to 10 days. Per readings below, pt has had a 10% weight loss since 10/2017. Severe for time frame. Medications include MVI and Vitamin K. Labs reviewed.  Ensure Enlive supplement ordered BID 7/21.  NUTRITION - FOCUSED PHYSICAL EXAM:    Most Recent Value  Orbital Region  Moderate depletion  Upper Arm Region  Severe depletion  Thoracic and Lumbar Region  Severe depletion  Buccal Region  Moderate depletion  Temple Region  Moderate depletion  Clavicle Bone Region  Severe depletion  Clavicle and Acromion Bone Region  Severe depletion  Scapular Bone Region  Unable to assess  Dorsal Hand  Unable to assess  Patellar  Region  Severe depletion  Anterior Thigh Region  Severe depletion  Posterior Calf Region  Severe depletion  Edema (RD Assessment)  None     Diet Order:   Diet Order           Diet regular Room service appropriate? Yes; Fluid consistency: Thin  Diet effective now         EDUCATION NEEDS:   Not appropriate for education at this time  Skin:  Skin Assessment: Reviewed RN Assessment  Last BM:  7/20  Height:   Ht Readings from Last 1 Encounters:  12/30/17 5\' 8"  (1.727 m)   Weight:   Wt Readings from Last 1 Encounters:  12/30/17 133 lb (60.3 kg)   Wt Readings from Last 10 Encounters:  12/30/17 133 lb (60.3 kg)  11/01/17 148 lb (67.1 kg)  10/25/17 148 lb (67.1 kg)  07/17/17 142 lb (64.4 kg)  05/11/17 150 lb (68 kg)  02/16/17 126 lb (57.2 kg)  01/22/17 130 lb (59 kg)  05/11/16 127 lb (57.6 kg)  05/02/16 138 lb 12.8 oz (63 kg)  01/28/16 136 lb 8 oz (61.9 kg)   Ideal Body Weight:  70 kg  BMI:  Body mass index is 20.22 kg/m.  Estimated Nutritional Needs:   Kcal:  1500-1700  Protein:  75-90 gm  Fluid:  1.5-1.7 L  Arthur Holms, RD, LDN Pager #: [phone] After-Hours Pager #: [phone]

## 2017-12-31 NOTE — Progress Notes (Signed)
PHARMACY - PHYSICIAN COMMUNICATION CRITICAL VALUE ALERT - BLOOD CULTURE IDENTIFICATION (BCID)  Jay Walters is an 84 y.o. male who presented to [facility] on 12/30/2017 with a chief complaint of cough, weakness, and shortness of breath.  Assessment:  He is currently being treated for a lung mass and post-obstructive pneumonia. He has 1 blood culture with Gram positive cocci. BCID detected Staphylococcus species which likely represents contamination.  Name of physician (or Provider) Contacted: [place]  Current antibiotics: Ceftriaxone + Azithromycin  Changes to prescribed antibiotics recommended:  Patient is on recommended antibiotics - No changes needed  Results for orders placed or performed during the hospital encounter of 12/30/17  Blood Culture ID Panel (Reflexed) (Collected: 12/30/2017  7:55 AM)  Result Value Ref Range   Enterococcus species NOT DETECTED NOT DETECTED   Listeria monocytogenes NOT DETECTED NOT DETECTED   Staphylococcus species DETECTED (A) NOT DETECTED   Staphylococcus aureus NOT DETECTED NOT DETECTED   Methicillin resistance NOT DETECTED NOT DETECTED   Streptococcus species NOT DETECTED NOT DETECTED   Streptococcus agalactiae NOT DETECTED NOT DETECTED   Streptococcus pneumoniae NOT DETECTED NOT DETECTED   Streptococcus pyogenes NOT DETECTED NOT DETECTED   Acinetobacter baumannii NOT DETECTED NOT DETECTED   Enterobacteriaceae species NOT DETECTED NOT DETECTED   Enterobacter cloacae complex NOT DETECTED NOT DETECTED   Escherichia coli NOT DETECTED NOT DETECTED   Klebsiella oxytoca NOT DETECTED NOT DETECTED   Klebsiella pneumoniae NOT DETECTED NOT DETECTED   Proteus species NOT DETECTED NOT DETECTED   Serratia marcescens NOT DETECTED NOT DETECTED   Haemophilus influenzae NOT DETECTED NOT DETECTED   Neisseria meningitidis NOT DETECTED NOT DETECTED   Pseudomonas aeruginosa NOT DETECTED NOT DETECTED   Candida albicans NOT DETECTED NOT DETECTED   Candida glabrata  NOT DETECTED NOT DETECTED   Candida krusei NOT DETECTED NOT DETECTED   Candida parapsilosis NOT DETECTED NOT DETECTED   Candida tropicalis NOT DETECTED NOT DETECTED    Legrand Como, Pharm.D., BCPS, BCIDP Clinical Pharmacist Phone: [phone] Please check AMION for all [place] numbers 12/31/2017, 10:08 AM

## 2017-12-31 NOTE — Progress Notes (Signed)
[facility] PCCM  S: Feels about the same  O:  Vitals:   12/30/17 1112 12/30/17 1553 12/30/17 2300 12/31/17 0838  BP: (!) 91/58 (!) 95/50 (!) 111/54 107/63  Pulse: 70 66 67 75  Resp:  (!) 36 20 (!) 32  Temp:  97.8 F (36.6 C) 98.5 F (36.9 C) 98.3 F (36.8 C)  TempSrc:  Oral Oral Oral  SpO2: 98% 98% 100% 97%  Weight:      Height:       2 L [place]  General:  Resting comfortably in bed HENT: NCAT OP clear PULM: Diminished RUL B, normal effort CV: RRR, no mgr GI: BS+, soft, nontender, left upper abdominal mass palpable but to deep palpation under costal border MSK: normal bulk and tone Neuro: awake, alert, no distress, MAEW  INR this morning 3.87  Impression/plan:  Right upper lobe mass with multiple abdominal lesions: agree with Dr. Lamonte Sakai, the best approach would be to get a tissue diagnosis from the abdominal lesion for staging purposes. However if that is non-diagnostic we are happy to perform a bronchoscopy.  Coagulopathy: from warfarin, s/p Vit K, follow INR prior to any biopsy  Patient updated with plan, discussed with Dr. Tyrell Antonio.  Roselie Awkward, MD [place] PCCM Pager: [phone] Cell: [phone] After 3pm or if no response, call [phone]

## 2017-12-31 NOTE — Care Management Note (Signed)
Case Management Note  Patient Details  Name: Jay Haskew MRN: [id number] Date of Birth: 1932/02/08  Subjective/Objective:   From home with spouse, , large r lung mass , atx vs pna, right pl effusion, multiple pulmonary nodules, multiple mesenteric retroperitoneal masses, bil adrenal masses, sepsis, afib, azotemia, chronic back pain, will need needles bx or bronch.                  Action/Plan: NCM will follow for dc needs.  Expected Discharge Date:                  Expected Discharge Plan:  Home/Self Care  In-House Referral:     Discharge planning Services  CM Consult  Post Acute Care Choice:    Choice offered to:     DME Arranged:    DME Agency:     HH Arranged:    HH Agency:     Status of Service:  In process, will continue to follow  If discussed at Long Length of Stay Meetings, dates discussed:    Additional Comments:  Zenon Mayo, RN 12/31/2017, 8:10 AM

## 2017-12-31 NOTE — Progress Notes (Signed)
ANTICOAGULATION CONSULT NOTE   Pharmacy Consult for Heparin Indication: atrial fibrillation  No Known Allergies  Patient Measurements: Height: 5\' 8"  (172.7 cm) Weight: 133 lb (60.3 kg) IBW/kg (Calculated) : 68.4 Heparin Dosing Weight: n/a  Vital Signs: Temp: 98.3 F (36.8 C) (07/22 0838) Temp Source: Oral (07/22 0838) BP: 107/63 (07/22 0838) Pulse Rate: 75 (07/22 0838)  Labs: Recent Labs    12/30/17 0659 12/30/17 1024 12/31/17 0312  HGB 13.2  --  12.4*  HCT 41.7  --  38.1*  PLT 287  --  232  LABPROT  --  >90.0* 37.7*  INR  --  >10.00* 3.87  CREATININE 1.23  --  0.97    Estimated Creatinine Clearance: 48.4 mL/min (by C-G formula based on SCr of 0.97 mg/dL).   Medical History: Past Medical History:  Diagnosis Date  . Anemia   . Arthritis    "right hip; right knee; lower back ~ 1/2 way across"  . Atrial fibrillation ([place])    remotely   . Atrial flutter ([place])    typical appearing by ekg. s/p ablation 07/28/11  . Diverticulosis   . Dysphagia    with solids  . Esophageal stricture   . GERD (gastroesophageal reflux disease)   . H/O hiatal hernia   . Heart murmur   . Hemorrhoids   . High cholesterol   . Hypertension   . Osteopenia   . Seizures ([place])    "back w/migraine headaches; 1990's or before"  . Sinus bradycardia    asymptomatic  . SSS (sick sinus syndrome) ([place])   . Stroke [facility]) 2012   "left me weaker on left side; balance is not good"    Medications:  Scheduled:  . atorvastatin  20 mg Oral q1800  . doxazosin  4 mg Oral Daily  . feeding supplement (ENSURE ENLIVE)  237 mL Oral BID BM  . ferrous sulfate  325 mg Oral BID PC  . finasteride  5 mg Oral Daily  . multivitamin with minerals  1 tablet Oral Daily  . phenytoin  300 mg Oral QHS  . sodium chloride flush  3 mL Intravenous Q12H  . tamsulosin  0.4 mg Oral BID    Assessment: 68 YOM with PMH of HTN, stroke, and afib on warfarin PTA who presented on 7/21 with cough and SOB. He needs a biopsy  for his lung mass and his warfarin is on hold.   His INR is down to 3.87 after receiving Vitamin K 7/21. No documented s/sx of bleeding. CBC within normal limits and stable.  Heparin is to start once INR is <2  Goal of Therapy:  INR 2-3 Monitor platelets by anticoagulation protocol: Yes   Plan:  Hold warfarin  Check daily INR Monitor for s/sx of bleeding Start heparin when INR <2   Legrand Como, Pharm.D., BCPS, BCIDP Clinical Pharmacist Phone: [phone] Please check AMION for all [place] numbers 12/31/2017, 10:10 AM

## 2017-12-31 NOTE — Progress Notes (Addendum)
PROGRESS NOTE    Jay Walters  WVP:[id number] DOB: January 05, 1932 DOA: 12/30/2017 PCP: Elby Showers, MD    Brief Narrative: Jay Walters is a 84 y.o. male with medical history significant of pretension, stroke, chronic back pain regard to cervical disc and lumbar disc myelopathy atrial fibrillation and flutter now in presents the emergency department with complaints of cough, weakness, shortness of breath and fever for approximately 1 week.  Denies any abdominal pain, chest pain, nausea vomiting diarrhea, hemoptysis, dizziness, diaphoresis, trauma, peripheral edema or other complaints.  He states that his cough is fairly wet without significant sputum production.  He also reports some associated weight loss of wife thinks approximately 12 pounds in the past week to 10 days.  He has had very poor appetite and has been unable to eat very much at all.  His shortness of breath has worsened to the point where when he presented to the emergency department his sats were 88%.  He does not use any home oxygen.  He has no prior history of smoking.  He does not have any occupational exposures to fumes, fibers, or known lung toxins.  Still complains of some malodorous urine.  In the emergency department his white blood cell count was noted to be 22,000, his lactate is elevated at 2 his BNP is also elevated.  Found to have a history of atrial flutter and an echocardiogram in 5462 showed diastolic dysfunction with an ejection fraction of 55 to 60% with a severely dilated right atrium.  Possibly low at 77/52 with respirations of 29 on presentation when I saw the patient he looks very comfortable.  He has received 2 L of fluid boluses in the emergency department as well as ceftriaxone and azithromycin.  His chest x-ray showed significant abnormalities in the right upper lobe with a severe consolidation and possible pleural fluid.  A CT scan was obtained which is consistent with a right upper lobe bronchogenic tumor with  metastases to peritoneal and retroperitoneal lymph nodes, hilar lymph nodes, adrenal glands, the largest abdominal lymph node measuring 7.7 cm.  Conversation with his wife she reports that the patient has recently seen his neurosurgeon due to pain across his shoulders and back that was thought due to his cervical disease.  He received steroid injections in his shoulder with no relief.  [facility] were asked to admit.     Assessment & Plan:   Principal Problem:   Bronchogenic carcinoma of right lung ([place]) Active Problems:   Atrial flutter (HCC)   Cervical disc disease with myelopathy   Long term current use of anticoagulant therapy   Chronic diastolic heart failure (HCC)   Cerebral arteriosclerosis with history of previous stroke   Acute respiratory insufficiency   Metastatic lung cancer (metastasis from lung to other site) [facility])   Pneumonia of upper lobe due to infectious organism ([place])  1-Right upper lobe lung Mass;  Pulmonary following.  IR consulted for  Biopsy.   2-Post obstructive PNA; Continue with IV ceftriaxone and azithromycin.   3-Acute hypoxic respiratory failure secondary to number one.  Continue with oxygen supplementation.   4-A flutter;  History of ablation in the past.  On coumadin. Holding coumadin in anticipation of biopsy.  INR supra-therapeutic at 10. Received a dose of vitamin K.   5-Cervical disc disease with myelopathy:  Patient has well described cervical and lumbar back problems he is on chronic pain medications which will be continued.  6-Chronic diastolic HF;  Appears compensated.   7-one  of two blood culture positive for staph, coagulase. Likely contaminant   Addendum;  Patient had shocking episode on water, per sister his speech is slurred.  His motor strength 5/5, he does slurred some words at time. Will get CT head, prior history of fall. If CT head negative, will do MRI with contrast rule out brain mets  DVT prophylaxis:  start heparin when INR less than 2.  Code Status: full code.  Family Communication:  Disposition Plan: Home when stable.   Consultants:   Pulmonology    Procedures:     Antimicrobials:  Ceftriaxone 7-21 Azithromycin 7-21  Subjective: He is breathing better on oxygen.  He report pain and dyspnea for years.    Objective: Vitals:   12/30/17 1110 12/30/17 1112 12/30/17 1553 12/30/17 2300  BP: (!) 84/55 (!) 91/58 (!) 95/50 (!) 111/54  Pulse: 69 70 66 67  Resp: 20  (!) 36 20  Temp: 98.1 F (36.7 C)  97.8 F (36.6 C) 98.5 F (36.9 C)  TempSrc: Oral  Oral Oral  SpO2: 100% 98% 98% 100%  Weight:      Height:        Intake/Output Summary (Last 24 hours) at 12/31/2017 0707 Last data filed at 12/30/2017 1553 Gross per 24 hour  Intake 2901.18 ml  Output -  Net 2901.18 ml   Filed Weights   12/30/17 0845  Weight: 60.3 kg (133 lb)    Examination:  General exam: thin appearing.  Respiratory system: crackles bases.  Cardiovascular system: S1 & S2 heard, RRR. No JVD, murmurs, rubs, gallops or clicks. No pedal edema. Gastrointestinal system: Abdomen is nondistended, soft and nontender. No organomegaly or masses felt. Normal bowel sounds heard. Central nervous system: Alert and oriented. No focal neurological deficits. Extremities: Symmetric 5 x 5 power. Skin: No rashes, lesions or ulcers Psychiatry: . Mood & affect appropriate.     Data Reviewed: I have personally reviewed following labs and imaging studies  CBC: Recent Labs  Lab 12/30/17 0659 12/31/17 0312  WBC 22.9* 24.6*  NEUTROABS 18.0*  --   HGB 13.2 12.4*  HCT 41.7 38.1*  MCV 96.5 94.1  PLT 287 732   Basic Metabolic Panel: Recent Labs  Lab 12/30/17 0659 12/31/17 0312  NA 143 142  K 3.7 3.6  CL 110 112*  CO2 20* 22  GLUCOSE 117* 142*  BUN 28* 28*  CREATININE 1.23 0.97  CALCIUM 8.2* 8.3*   GFR: Estimated Creatinine Clearance: 48.4 mL/min (by C-G formula based on SCr of 0.97 mg/dL). Liver  Function Tests: Recent Labs  Lab 12/30/17 0659  AST 16  ALT 15  ALKPHOS 67  BILITOT 0.9  PROT 5.2*  ALBUMIN 2.1*   No results for input(s): LIPASE, AMYLASE in the last 168 hours. No results for input(s): AMMONIA in the last 168 hours. Coagulation Profile: Recent Labs  Lab 12/30/17 1024 12/31/17 0312  INR >10.00* 3.87   Cardiac Enzymes: No results for input(s): CKTOTAL, CKMB, CKMBINDEX, TROPONINI in the last 168 hours. BNP (last 3 results) No results for input(s): PROBNP in the last 8760 hours. HbA1C: No results for input(s): HGBA1C in the last 72 hours. CBG: No results for input(s): GLUCAP in the last 168 hours. Lipid Profile: No results for input(s): CHOL, HDL, LDLCALC, TRIG, CHOLHDL, LDLDIRECT in the last 72 hours. Thyroid Function Tests: Recent Labs    12/30/17 1024  TSH 1.922   Anemia Panel: No results for input(s): VITAMINB12, FOLATE, FERRITIN, TIBC, IRON, RETICCTPCT in the last 72 hours.  Sepsis Labs: Recent Labs  Lab 12/30/17 0754 12/30/17 1038  LATICACIDVEN 2.05* 1.66    No results found for this or any previous visit (from the past 240 hour(s)).       Radiology Studies: Dg Chest 2 View  Result Date: 12/30/2017 CLINICAL DATA:  Shortness of breath several weeks. EXAM: CHEST - 2 VIEW COMPARISON:  02/02/2015 FINDINGS: New confluent consolidation over the right upper lobe with suggestion of pleural fluid over the right apex. Mild tracheal deviation to the right. Left lung is clear. Cardiac silhouette is within normal. Remainder the exam is unchanged. IMPRESSION: New right upper lobe collapse with possible loculated right apical pleural fluid. Recommend chest CT with contrast for further evaluation to exclude central obstructing lesion. Electronically Signed   By: Marin Olp M.D.   On: 12/30/2017 07:42   Ct Chest W Contrast  Result Date: 12/30/2017 CLINICAL DATA:  Chest pain and shortness-of-breath. EXAM: CT CHEST WITH CONTRAST TECHNIQUE: Multidetector  CT imaging of the chest was performed during intravenous contrast administration. CONTRAST:  59mL OMNIPAQUE IOHEXOL 300 MG/ML  SOLN COMPARISON:  Chest x-ray today as well as chest CT 04/16/2012 FINDINGS: Cardiovascular: Heart is normal size. Calcified plaque over the left anterior descending coronary artery. Minimal calcified plaque over the thoracic aorta. Prominence of the main pulmonary arteries. No evidence of pulmonary embolism. Mild compression of the SVC due to the right upper lobe lung mass as well as mediastinal adenopathy. Right upper lobe pulmonary artery is also mildly compressed by the adjacent adenopathy and right upper lobe mass. Mediastinum/Nodes: Extensive mediastinal adenopathy. Right superior mediastinal lymph node measures 2.6 cm by short axis immediately below the thyroid gland. Large right paratracheal/pretracheal lymph node measures 3.5 cm by short axis. Subcarinal lymph node measures 2.6 cm by short axis. Right hilar adenopathy appears to be confluent with the right upper lobe mass. Small lymph nodes along the right pericardiophrenic region and adjacent the distal esophagus. Lungs/Pleura: Examination demonstrates complete collapse of the right upper lobe with obstructed right upper lobe bronchus. Findings suggest a large right upper lobe lung mass with borders difficult to define, but measuring approximately 6.1 x 8.2 cm with minimal mass effect on the adjacent SVC and right upper lobe pulmonary artery. This favors primary bronchogenic carcinoma. This mass abuts the suprahilar region. There is loculated fluid over the right upper lobe/apex. There are numerous bilateral pulmonary nodules compatible with metastatic disease. Small amount of pleural fluid in the right base. Upper Abdomen: Numerous mesenteric, retroperitoneal and peritoneal masses compatible metastatic disease with the largest over the left upper quadrant measuring 6.4 x 7.7 cm. Bilateral adrenal masses likely metastatic disease.  Somewhat nodular wall thickening of the gallbladder as cannot exclude metastatic disease. Minimal calcified plaque over the abdominal aorta. Musculoskeletal: Curvature of the thoracic spine convex right. Degenerative change of the spine. IMPRESSION: Large right upper lobe mass extending towards the suprahilar region measuring approximately 6.1 x 8.2 cm concerning for primary bronchogenic carcinoma. This causes obstruction of the right upper lobe bronchus with mild mass effect on the SVC and right upper lobe pulmonary artery. Small associated right pleural effusion. Extensive associated mediastinal and right hilar adenopathy. Numerous bilateral pulmonary nodules compatible with metastatic disease. Numerous mesenteric, retroperitoneal and peritoneal nodules/masses compatible with metastatic disease with the largest over the left upper quadrant measuring 7.7 cm. Bilateral adrenal masses likely metastatic disease. Nodular wall thickening of the gallbladder suggesting metastatic disease. Minimal atherosclerotic coronary artery disease. Aortic Atherosclerosis (ICD10-I70.0). Electronically Signed   By: Quillian Quince  Derrel Nip M.D.   On: 12/30/2017 10:14        Scheduled Meds: . atorvastatin  20 mg Oral q1800  . doxazosin  4 mg Oral Daily  . feeding supplement (ENSURE ENLIVE)  237 mL Oral BID BM  . ferrous sulfate  325 mg Oral BID PC  . finasteride  5 mg Oral Daily  . multivitamin with minerals  1 tablet Oral Daily  . phenytoin  300 mg Oral QHS  . sodium chloride flush  3 mL Intravenous Q12H  . tamsulosin  0.4 mg Oral BID   Continuous Infusions: . sodium chloride    . 0.9 % NaCl with KCl 20 mEq / L 75 mL/hr at 12/31/17 0146  . azithromycin Stopped (12/30/17 0954)  . cefTRIAXone (ROCEPHIN)  IV Stopped (12/30/17 0847)     LOS: 1 day    Time spent: 35 minutes.     Elmarie Shiley, MD [facility] Pager [phone]  If 7PM-7AM, please contact night-coverage [email or web address] Password  [facility] 12/31/2017, 7:07 AM

## 2018-01-01 ENCOUNTER — Inpatient Hospital Stay (HOSPITAL_COMMUNITY): Payer: 59

## 2018-01-01 ENCOUNTER — Encounter (HOSPITAL_COMMUNITY): Payer: Self-pay | Admitting: Interventional Radiology

## 2018-01-01 HISTORY — PX: IR US GUIDE BX ASP/DRAIN: IMG2392

## 2018-01-01 LAB — PROTIME-INR
INR: 1.61
INR: 1.57
PROTHROMBIN TIME: 19 s — AB (ref 11.4–15.2)
Prothrombin Time: 18.7 s — ABNORMAL HIGH (ref 11.4–15.2)

## 2018-01-01 LAB — BASIC METABOLIC PANEL
Anion gap: 9 (ref 5–15)
BUN: 27 mg/dL — AB (ref 8–23)
CO2: 19 mmol/L — ABNORMAL LOW (ref 22–32)
CREATININE: 0.9 mg/dL (ref 0.61–1.24)
Calcium: 8.1 mg/dL — ABNORMAL LOW (ref 8.9–10.3)
Chloride: 116 mmol/L — ABNORMAL HIGH (ref 98–111)
GFR calc Af Amer: >60 mL/min (ref >60)
GLUCOSE: 105 mg/dL — AB (ref 70–99)
Potassium: 4.3 mmol/L (ref 3.5–5.1)
Sodium: 144 mmol/L (ref 135–145)

## 2018-01-01 LAB — CBC
HCT: 39.3 % (ref 39.0–52.0)
HEMOGLOBIN: 12.4 g/dL — AB (ref 13.0–17.0)
MCH: 30.8 pg (ref 26.0–34.0)
MCHC: 31.6 g/dL (ref 30.0–36.0)
MCV: 97.5 fL (ref 78.0–100.0)
PLATELETS: 227 10*3/uL (ref 150–400)
RBC: 4.03 MIL/uL — AB (ref 4.22–5.81)
RDW: 16.2 % — ABNORMAL HIGH (ref 11.5–15.5)
WBC: 28.7 10*3/uL — ABNORMAL HIGH (ref 4.0–10.5)

## 2018-01-01 LAB — BLOOD GAS, ARTERIAL
Acid-base deficit: 4.5 mmol/L — ABNORMAL HIGH (ref 0.0–2.0)
BICARBONATE: 19.1 mmol/L — AB (ref 20.0–28.0)
Drawn by: 535471
O2 Content: 1.5 L/min
O2 SAT: 97 %
PCO2 ART: 29.2 mmHg — AB (ref 32.0–48.0)
PO2 ART: 89.7 mmHg (ref 83.0–108.0)
Patient temperature: 98.6
pH, Arterial: 7.43 (ref 7.350–7.450)

## 2018-01-01 LAB — VITAMIN B12: VITAMIN B 12: 317 pg/mL (ref 180–914)

## 2018-01-01 LAB — CK: CK TOTAL: 107 U/L (ref 49–397)

## 2018-01-01 LAB — TSH: TSH: 1.748 u[IU]/mL (ref 0.350–4.500)

## 2018-01-01 LAB — GLUCOSE, CAPILLARY: Glucose-Capillary: 115 mg/dL — ABNORMAL HIGH (ref 70–99)

## 2018-01-01 LAB — AMMONIA: Ammonia: 21 umol/L (ref 9–35)

## 2018-01-01 MED ORDER — MIDAZOLAM HCL 2 MG/2ML IJ SOLN
INTRAMUSCULAR | Status: AC | PRN
  Administered 2018-01-01: 0.5 mg via INTRAVENOUS

## 2018-01-01 MED ORDER — PIPERACILLIN-TAZOBACTAM 3.375 G IVPB
3.3750 g | Freq: Three times a day (TID) | INTRAVENOUS | Status: DC
  Administered 2018-01-01 – 2018-01-03 (×5): 3.375 g via INTRAVENOUS
  Filled 2018-01-01 (×8): qty 50

## 2018-01-01 MED ORDER — VANCOMYCIN HCL IN DEXTROSE 1-5 GM/200ML-% IV SOLN
1000.0000 mg | INTRAVENOUS | Status: DC
  Administered 2018-01-02: 1000 mg via INTRAVENOUS
  Filled 2018-01-01: qty 200

## 2018-01-01 MED ORDER — SODIUM CHLORIDE 0.9 % IV SOLN
INTRAVENOUS | Status: DC
  Administered 2018-01-02: 03:00:00 via INTRAVENOUS

## 2018-01-01 MED ORDER — WARFARIN - PHARMACIST DOSING INPATIENT: Freq: Every day | Status: DC

## 2018-01-01 MED ORDER — PIPERACILLIN-TAZOBACTAM 3.375 G IVPB 30 MIN
3.3750 g | Freq: Once | INTRAVENOUS | Status: AC
  Administered 2018-01-01: 3.375 g via INTRAVENOUS
  Filled 2018-01-01: qty 50

## 2018-01-01 MED ORDER — LIDOCAINE HCL 1 % IJ SOLN
INTRAMUSCULAR | Status: AC
  Filled 2018-01-01: qty 20

## 2018-01-01 MED ORDER — MIDAZOLAM HCL 2 MG/2ML IJ SOLN
INTRAMUSCULAR | Status: AC
  Filled 2018-01-01: qty 2

## 2018-01-01 MED ORDER — FENTANYL CITRATE (PF) 100 MCG/2ML IJ SOLN
INTRAMUSCULAR | Status: AC
  Filled 2018-01-01: qty 2

## 2018-01-01 MED ORDER — HEPARIN (PORCINE) IN NACL 100-0.45 UNIT/ML-% IJ SOLN: 700.0000 [IU]/h | INTRAMUSCULAR | Status: DC

## 2018-01-01 MED ORDER — WARFARIN SODIUM 3 MG PO TABS
3.0000 mg | ORAL_TABLET | Freq: Once | ORAL | Status: AC
  Administered 2018-01-01: 3 mg via ORAL
  Filled 2018-01-01: qty 1

## 2018-01-01 MED ORDER — VANCOMYCIN HCL 10 G IV SOLR
1250.0000 mg | Freq: Once | INTRAVENOUS | Status: AC
  Administered 2018-01-01: 1250 mg via INTRAVENOUS
  Filled 2018-01-01: qty 1250

## 2018-01-01 NOTE — Progress Notes (Signed)
[facility]  Pulmonary following along, will see again after biopsy result is available.  Roselie Awkward, MD [facility] Pager: [phone] Cell: [phone] After 3pm or if no response, call [phone]

## 2018-01-01 NOTE — Significant Event (Addendum)
Rapid Response Event Note  Overview: Neurologic - AMS/Slurred Speech  Initial Focused Assessment: Called 0054 about concerns over worsening slurred speech. Per RN, patient had new onset to slurred speech yesterday and a STAT HEAD CT was ordered. Per RN, CT was done and it was done til I was called. When I arrived, patient was in CT and I was able to assess after.  Upon entering his room, patient was alert, able to follow commands, speech was very slurred, was able move his eyes in all directions when prompted but would otherwise just gaze straight forward, pupils 4 mm bilateral/reactive/brisk.  BUE 5/5, no drift, equal grips, no sensory loss.  However BLE 3/5 and had significant weakness bilaterally but no sensory loss, LLE was slightly weaker than RLE. + Limb Ataxia, + visual neglect as well. NIH = 11. Skin warm and dry and VSS. Not in acute distress. Of note, LSN - Unknown and cannot be determined.  Interventions: - Patient just got CT HEAD - negative -  I paged [place] NP at [phone], page returned at [phone], patient discussed and orders received for MRI BRAIN STAT.  Plan of Care: - Monitor neuro exam  Event Summary:    at    Call Time 0054 Arrival Time 0100 ( Patient was in CT, did not see til 120) End Time [place], [place]

## 2018-01-01 NOTE — Consult Note (Addendum)
Neurology Consultation  Reason for Consult: AMS and LE weakness Referring Physician: Regalado  History is obtained from:   HPI: Jay Walters is a 84 y.o. male with history of atrial fibrillation, high hyperlipidemia, hypertension, seizures, stroke which has left him having gait difficulty and balance difficulty.  And talking to the sister apparently he has been bedridden for the past 2 weeks secondary to back pain and weakness in his legs.  He does walk with a walker however the last 2 weeks if not more he is progressively gotten weaker.  Per sister he apparently is left alone all day at home while his wife works and there is as she states "no telling how many times he is fallen".  I tried to get in contact with the wife however she is unavailable at this time.  Patient was brought to the emergency department where "his white blood cell count was noted to be 22,000, his lactate is elevated at 2 his BNP is also elevated. Found to have a history of atrial flutter and an echocardiogram in 1096 showed diastolic dysfunction with an ejection fraction of 55 to 60% with a severely dilated right atrium. Pressure was found to be as low as 77/52 with respirations of 29 on presentation . He wired 2 L of fluid boluses in the emergency department as well as ceftriaxone and azithromycin. His chest x-ray showed significant abnormalities in the right upper lobe with a severe consolidation and possible pleural fluid. 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  On admission INR level was greater than 10 and he is on chronic Coumadin."  Well in the hospital apparently had an episode of slurred speech.  Thus neurology was consulted.  MRI was obtained.  Showing bilateral occipital infarcts.  Allergy was consulted secondary to the MRI  findings.   LKW: Unknown tpa given?: no, no last known normal Premorbid modified Rankin scale (mRS): 4 NIHSS 5  ROS: A 14 point ROS was performed and is negative except as noted in the HPI.   Past Medical History:  Diagnosis Date  . Anemia   . Arthritis    "right hip; right knee; lower back ~ 1/2 way across"  . Atrial fibrillation ([place])    remotely   . Atrial flutter ([place])    typical appearing by ekg. s/p ablation 07/28/11  . Diverticulosis   . Dysphagia    with solids  . Esophageal stricture   . GERD (gastroesophageal reflux disease)   . H/O hiatal hernia   . Heart murmur   . Hemorrhoids   . High cholesterol   . Hypertension   . Osteopenia   . Seizures ([place])    "back w/migraine headaches; 1990's or before"  . Sinus bradycardia    asymptomatic  . SSS (sick sinus syndrome) ([place])   . Stroke [facility]) 2012   "left me weaker on left side; balance is not good"    Stroke morbilities -Afib -HTN -hypercholestolemia   Family History  Problem Relation Age of Onset  . Heart disease Father   . Cancer Sister        unsure what type     Social History:   reports that he has never smoked. He has never used smokeless tobacco. He reports that he drinks alcohol. He reports that he does not use drugs.  Medications  Current Facility-Administered Medications:  .  0.9 %  sodium chloride infusion, 250 mL, Intravenous, PRN, Randa Spike C, MD .  0.9 % NaCl with KCl 20 mEq/ L  infusion, , Intravenous, Continuous, Lady Deutscher, MD, Last Rate: 75 mL/hr at 01/01/18 0556 .  acetaminophen (TYLENOL) tablet 650 mg, 650 mg, Oral, Q6H PRN, 650 mg at 12/31/17 1631 **OR** acetaminophen (TYLENOL) suppository 650 mg, 650 mg, Rectal, Q6H PRN, Lady Deutscher, MD .  atorvastatin (LIPITOR) tablet 20 mg, 20 mg, Oral, q1800, Lady Deutscher, MD, 20 mg at 12/31/17 1753 .  azithromycin (ZITHROMAX) 500 mg in sodium chloride 0.9 % 250 mL IVPB, 500 mg, Intravenous, Q24H, Evangeline Gula Wyatt Haste,  MD, Stopped at 12/31/17 1604 .  doxazosin (CARDURA) tablet 4 mg, 4 mg, Oral, Daily, Lady Deutscher, MD, Stopped at 01/01/18 [phone] .  feeding supplement (ENSURE ENLIVE) (ENSURE ENLIVE) liquid 237 mL, 237 mL, Oral, BID BM, Lady Deutscher, MD, Stopped at 01/01/18 [phone] .  ferrous sulfate tablet 325 mg, 325 mg, Oral, BID PC, Lady Deutscher, MD, Stopped at 01/01/18 [phone] .  finasteride (PROSCAR) tablet 5 mg, 5 mg, Oral, Daily, Lady Deutscher, MD, Stopped at 01/01/18 [phone] .  HYDROcodone-acetaminophen (NORCO) 10-325 MG per tablet 1 tablet, 1 tablet, Oral, Q4H PRN, Lady Deutscher, MD, 1 tablet at 12/30/17 2114 .  hydroxypropyl methylcellulose / hypromellose (ISOPTO TEARS / GONIOVISC) 2.5 % ophthalmic solution 1 drop, 1 drop, Both Eyes, Daily PRN, Lady Deutscher, MD .  multivitamin with minerals tablet 1 tablet, 1 tablet, Oral, Daily, Lady Deutscher, MD, Stopped at 01/01/18 [phone] .  ondansetron (ZOFRAN) tablet 4 mg, 4 mg, Oral, Q6H PRN **OR** ondansetron (ZOFRAN) injection 4 mg, 4 mg, Intravenous, Q6H PRN, Lady Deutscher, MD .  phenytoin (DILANTIN) ER capsule 300 mg, 300 mg, Oral, QHS, Lady Deutscher, MD, 300 mg at 12/31/17 2128 .  sodium chloride flush (NS) 0.9 % injection 3 mL, 3 mL, Intravenous, Q12H, Lady Deutscher, MD, Stopped at 01/01/18 [phone] .  sodium chloride flush (NS) 0.9 % injection 3 mL, 3 mL, Intravenous, PRN, Lady Deutscher, MD .  tamsulosin [facility]) capsule 0.4 mg, 0.4 mg, Oral, BID, Evangeline Gula Wyatt Haste, MD, Stopped at 01/01/18 [phone] .  traZODone (DESYREL) tablet 25 mg, 25 mg, Oral, QHS PRN, Lady Deutscher, MD, 25 mg at 12/30/17 2115   Exam: Current vital signs: BP 112/63 (BP Location: Right Arm)   Pulse 71   Temp 98.2 F (36.8 C) (Oral)   Resp (!) 30   Ht 5\' 8"  (1.727 m)   Wt 65.4 kg (144 lb 2.9 oz)   SpO2 96%   BMI 21.92 kg/m  Vital signs in last 24 hours: Temp:  [98.2 F (36.8 C)-98.3 F (36.8 C)] 98.2 F (36.8 C) (07/23 0042) Pulse  Rate:  [70-71] 71 (07/23 0042) Resp:  [30] 30 (07/22 1522) BP: (99-112)/(56-63) 112/63 (07/23 0042) SpO2:  [96 %-97 %] 96 % (07/23 0042) Weight:  [65.4 kg (144 lb 2.9 oz)] 65.4 kg (144 lb 2.9 oz) (07/23 0500)  GENERAL: Awake, alert in NAD HEENT: - Normocephalic and atraumatic, dry  Ext: warm, well perfused, intact peripheral pulses, no edema  NEURO:  Mental Status: Patient is alert, he knows he is in the hospital, he thinks it is August, he was unable to get a year.  He was able to do simple commands and also able to do some complex commands such as taking his left thumb and touching  his right ear.  He was unable to recall 3 objects. Language: speech is clear.  Naming, repetition, fluency, and comprehension intact. Cranial Nerves: PERRL 2 mm/brisk. EOMI, visual fields full, no facial asymmetry, facial sensation intact, hearing intact, tongue/uvula/soft palate midline,  Motor: Upper extremities are 5 out of 5, patient unable to lift legs off of the bed or hold him antigravity.  He has loss of muscle mass bilateral legs.  2/5 with knee extension and flexion.  He does have 4+/5 with dorsiflexion plantarflexion. Reflexes: diminished bilaterally. Absent patellar and ankle reflexes.1+ over biceps.  Tone: is normal bulk is decreased Sensation- Intact to light touch bilaterally Coordination: No dysmetria finger-nose however he did pass point with his right hand Gait- deferred     Labs I have reviewed labs in epic and the results pertinent to this consultation are: Blood culture: Culture GRAM POSITIVE COCCI  Final      CBC    Component Value Date/Time   WBC 28.7 (H) 01/01/2018 0803   RBC 4.03 (L) 01/01/2018 0803   HGB 12.4 (L) 01/01/2018 0803   HGB 13.7 07/17/2017 1622   HCT 39.3 01/01/2018 0803   HCT 40.6 07/17/2017 1622   PLT 227 01/01/2018 0803   PLT 214 07/17/2017 1622   MCV 97.5 01/01/2018 0803   MCV 93 07/17/2017 1622   MCH 30.8 01/01/2018 0803   MCHC 31.6 01/01/2018 0803    RDW 16.2 (H) 01/01/2018 0803   RDW 14.4 07/17/2017 1622   LYMPHSABS 1.6 12/30/2017 0659   MONOABS 1.7 (H) 12/30/2017 0659   EOSABS 1.2 (H) 12/30/2017 0659   BASOSABS 0.2 (H) 12/30/2017 0659    CMP     Component Value Date/Time   NA 144 01/01/2018 0803   NA 143 07/17/2017 1622   K 4.3 01/01/2018 0803   CL 116 (H) 01/01/2018 0803   CO2 19 (L) 01/01/2018 0803   GLUCOSE 105 (H) 01/01/2018 0803   BUN 27 (H) 01/01/2018 0803   BUN 20 07/17/2017 1622   CREATININE 0.90 01/01/2018 0803   CREATININE 0.85 11/13/2017 1407   CALCIUM 8.1 (L) 01/01/2018 0803   PROT 5.2 (L) 12/30/2017 0659   PROT 7.3 07/17/2017 1622   ALBUMIN 2.1 (L) 12/30/2017 0659   ALBUMIN 4.1 07/17/2017 1622   AST 16 12/30/2017 0659   ALT 15 12/30/2017 0659   ALKPHOS 67 12/30/2017 0659   BILITOT 0.9 12/30/2017 0659   BILITOT <0.2 07/17/2017 1622   GFRNONAA >60 01/01/2018 0803   GFRNONAA 84 02/16/2017 1143   GFRAA >60 01/01/2018 0803   GFRAA 98 02/16/2017 1143    Lipid Panel     Component Value Date/Time   CHOL 171 07/17/2017 1622   TRIG 79 07/17/2017 1622   HDL 50 07/17/2017 1622   CHOLHDL 3.4 07/17/2017 1622   CHOLHDL 3.1 12/01/2016 1105   VLDL 19 12/01/2016 1105   LDLCALC 105 (H) 07/17/2017 1622     Imaging I have reviewed the images obtained:  CT-scan of the brain: IMPRESSION: No acute intracranial abnormalities. Chronic atrophy and small vessel ischemic changes.   MRI examination of the brain-- IMPRESSION: 1. Bilateral acute nonhemorrhagic occipital lobe infarcts, right greater than left. There is cortical involvement on the right measuring up to 17 mm. The left occipital infarct is contain to the white matter. 2. Progression of advanced diffuse cerebral atrophy and white matter Disease.     NEUROHOSPITALIST ADDENDUM Seen and examined the patient today. I have reviewed the contents of history and physical exam  as documented by PA/ARNP/Resident and agree with above documentation.  I  have discussed and formulated the above plan as documented. Edits to the note have been made as needed.   Assessment: 84 year old male with a 2-week history of being bedridden secondary to lower extremity weakness which has been progressive for greater than 2 weeks.  Upon hospitalization found to be septic along with showing a lung mass concerning for bronchogenic carcinoma.  Further investigation underway.  Patient was also found to have an INR greater than 10 and Coumadin stopped and received Vitamin. Patient was confused and MRI brain ordered which showed  multiple infarcts in the occipital lobes.   Impression:  - Subacute infarcts in bilateral occipital lobes-which is likely a incidental finding and has no correlation with his bilateral lower extremity weakness and or confusion  Etiology;  Likely cardioembolic in the setting of Afib.  Recommendations: -- Restart Warfarin with goal of 2-3 INR, most recent INR 1.57, No need to bridge with heparin   #Transthoracic Echo:  performed, shows Ef 65- 70 %, no RWMA, no LV thrombus # Start patient on ASA 325mg  daily # Carotid doppler  #Start or continue Atorvastatin 40 mg/other high intensity statin # BP goal: gradual reduction to normotension  # HBAIC and Lipid profile # Telemetry monitoring # Frequent neuro checks #  stroke swallow screen  Subacute to chronic bilateral lower extremity weakness -D/D myelopathy ( less likely)  vs neuropathy, deconditioning  - hyporeflexic --b12, TSH: normal -- check  CK - Consider MRI T spine and L spine, prior MRI L in 2014 showed severe right foraminal stenosis.  - PT/OT       Karena Addison Hetty Linhart MD [facility] [phone]   If 7pm to 7am, please call on call as listed on AMION.

## 2018-01-01 NOTE — Progress Notes (Addendum)
ANTICOAGULATION CONSULT NOTE   Pharmacy Consult for Heparin Indication: atrial fibrillation and acute CVA  No Known Allergies  Patient Measurements: Height: 5\' 8"  (172.7 cm) Weight: 144 lb 2.9 oz (65.4 kg) IBW/kg (Calculated) : 68.4 Heparin Dosing Weight: n/a  Vital Signs: Temp: 98.2 F (36.8 C) (07/23 0042) Temp Source: Oral (07/23 0042) BP: 112/63 (07/23 0042) Pulse Rate: 71 (07/23 0042)  Labs: Recent Labs    12/30/17 0659 12/30/17 1024 12/31/17 0312 01/01/18 0202 01/01/18 0803  HGB 13.2  --  12.4*  --  12.4*  HCT 41.7  --  38.1*  --  39.3  PLT 287  --  232  --  227  LABPROT  --  >90.0* 37.7* 19.0*  --   INR  --  >10.00* 3.87 1.61  --   CREATININE 1.23  --  0.97  --  0.90    Estimated Creatinine Clearance: 56.5 mL/min (by C-G formula based on SCr of 0.9 mg/dL).   Medical History: Past Medical History:  Diagnosis Date  . Anemia   . Arthritis    "right hip; right knee; lower back ~ 1/2 way across"  . Atrial fibrillation ([place])    remotely   . Atrial flutter ([place])    typical appearing by ekg. s/p ablation 07/28/11  . Diverticulosis   . Dysphagia    with solids  . Esophageal stricture   . GERD (gastroesophageal reflux disease)   . H/O hiatal hernia   . Heart murmur   . Hemorrhoids   . High cholesterol   . Hypertension   . Osteopenia   . Seizures ([place])    "back w/migraine headaches; 1990's or before"  . Sinus bradycardia    asymptomatic  . SSS (sick sinus syndrome) ([place])   . Stroke [facility]) 2012   "left me weaker on left side; balance is not good"    Medications:  Scheduled:  . atorvastatin  20 mg Oral q1800  . doxazosin  4 mg Oral Daily  . feeding supplement (ENSURE ENLIVE)  237 mL Oral BID BM  . ferrous sulfate  325 mg Oral BID PC  . finasteride  5 mg Oral Daily  . multivitamin with minerals  1 tablet Oral Daily  . phenytoin  300 mg Oral QHS  . sodium chloride flush  3 mL Intravenous Q12H  . tamsulosin  0.4 mg Oral BID    Assessment: 37 YOM  with PMH of HTN, stroke, and afib on warfarin PTA who presented on 7/21 with cough and SOB. INR > 10 on admission and given vitamin K for reversal. Worsening AMS on 7/23 and MRI significant for bilateral acute nonhemorrhagic occipital lobe infarcts. Warfarin is on hold due to pending biopsy of lung mass.   INR today is 1.61 with stable CBC.  Goal of Therapy:  INR 2-3 Heparin level 0.3-0.5 units/ml Monitor platelets by anticoagulation protocol: Yes   Plan:  Initial plan was to start heparin infusion for acute CVA but this is now on hold due to planned biopsy of abdominal mass this afternoon. Post procedure MD to determine start time of heparin. Will continue to follow.     Vincenza Hews, PharmD, BCPS Please check AMION for all [place] numbers 01/01/2018, 11:07 AM

## 2018-01-01 NOTE — Progress Notes (Signed)
SLP Cancellation Note  Patient Details Name: Jay Walters MRN: [id number] DOB: Dec 08, 1931   Cancelled treatment:       Reason Eval/Treat Not Completed: Medical issues which prohibited therapy - pt currently NPO pending biopsy, to be completed this afternoon. Will f/u for swallow evaluation as able.   Germain Osgood 01/01/2018, 2:37 PM  Germain Osgood, M.A. CCC-SLP [phone]

## 2018-01-01 NOTE — Progress Notes (Signed)
MRI called to see when pt will be able to be transported down for STAT brain MRI. Per MRI staff, pt 6th on the list, most likely will not go down for MRI before 7 am.

## 2018-01-01 NOTE — Progress Notes (Addendum)
Pharmacy Antibiotic Note Jay Walters is a 84 y.o. male admitted on 12/30/2017 with R upper lobe mass and concern for post obstructive PNA. Initially started on ceftriaxone and azithromycin but to change to Zosyn to increase in WBC.   Discussed with Dr Tyrell Antonio about BCID findings. Although possibly contaminant with 1/2 blood cultures growing coag-negative staph, given WBC of 28.7, would like to initiate vancomycin until repeat blood cultures come back. Afebrile. Scr 0.9.  Plan: Vancomycin 1250 mg IV once then 1000 mg IV every 24 hours. Zosyn 3.375g IV q8h (4 hour infusion).  Monitor renal fx, clinical pic, cx results, and VT as appropriate  Height: 5\' 8"  (172.7 cm) Weight: 144 lb 2.9 oz (65.4 kg) IBW/kg (Calculated) : 68.4  Temp (24hrs), Avg:98.3 F (36.8 C), Min:98.2 F (36.8 C), Max:98.3 F (36.8 C)  Recent Labs  Lab 12/30/17 0659 12/30/17 0754 12/30/17 1038 12/31/17 0312 01/01/18 0803  WBC 22.9*  --   --  24.6* 28.7*  CREATININE 1.23  --   --  0.97 0.90  LATICACIDVEN  --  2.05* 1.66  --   --      No Known Allergies  Antimicrobials this admission:  Azithromycin IV 7/21>>  Zosyn 7/23 >>   Vancomycin 7/23>>  Ceftriaxone /21>>7/23  Microbiology results: 7/23 BCx x2: sent 7/21 BCx x2: 1/2 GPCs, Staph species on BCID  Thank you for allowing pharmacy to be a part of this patient's care.  Doylene Canard, PharmD Clinical Pharmacist  Pager: [phone] Phone: [phone] 01/01/2018, 3:06 PM

## 2018-01-01 NOTE — Progress Notes (Addendum)
PROGRESS NOTE    Jay Walters  RWE:[id number] DOB: 01-07-1932 DOA: 12/30/2017 PCP: Elby Showers, MD    Brief Narrative: Jay Walters is a 84 y.o. male with medical history significant of pretension, stroke, chronic back pain regard to cervical disc and lumbar disc myelopathy atrial fibrillation and flutter now in presents the emergency department with complaints of cough, weakness, shortness of breath and fever for approximately 1 week.  Denies any abdominal pain, chest pain, nausea vomiting diarrhea, hemoptysis, dizziness, diaphoresis, trauma, peripheral edema or other complaints.  He states that his cough is fairly wet without significant sputum production.  He also reports some associated weight loss of wife thinks approximately 12 pounds in the past week to 10 days.  He has had very poor appetite and has been unable to eat very much at all.  His shortness of breath has worsened to the point where when he presented to the emergency department his sats were 88%.  He does not use any home oxygen.  He has no prior history of smoking.  He does not have any occupational exposures to fumes, fibers, or known lung toxins.  Still complains of some malodorous urine.  In the emergency department his white blood cell count was noted to be 22,000, his lactate is elevated at 2 his BNP is also elevated.  Found to have a history of atrial flutter and an echocardiogram in 6195 showed diastolic dysfunction with an ejection fraction of 55 to 60% with a severely dilated right atrium.  Possibly low at 77/52 with respirations of 29 on presentation when I saw the patient he looks very comfortable.  He has received 2 L of fluid boluses in the emergency department as well as ceftriaxone and azithromycin.  His chest x-ray showed significant abnormalities in the right upper lobe with a severe consolidation and possible pleural fluid.  A CT scan was obtained which is consistent with a right upper lobe bronchogenic tumor with  metastases to peritoneal and retroperitoneal lymph nodes, hilar lymph nodes, adrenal glands, the largest abdominal lymph node measuring 7.7 cm.  Conversation with his wife she reports that the patient has recently seen his neurosurgeon due to pain across his shoulders and back that was thought due to his cervical disease.  He received steroid injections in his shoulder with no relief.  [facility] were asked to admit.     Assessment & Plan:   Principal Problem:   Bronchogenic carcinoma of right lung ([place]) Active Problems:   Atrial flutter (HCC)   Cervical disc disease with myelopathy   Long term current use of anticoagulant therapy   Chronic diastolic heart failure (HCC)   Cerebral arteriosclerosis with history of previous stroke   Acute respiratory insufficiency   Metastatic lung cancer (metastasis from lung to other site) [facility])   Pneumonia of upper lobe due to infectious organism ([place])   Protein-calorie malnutrition, severe  1-Acute neurological deficit;  Patient had shocking episode on water, per sister his speech is slurred on 7-22. He would slurred a few words when I first saw him yesterday early morning.  Sister came later and reported slurred speech was new. I did neuro exam and  his motor strength was  5/5, he does slurred some words at time.  -CT head was negative foe bleeding.  -overnight he develops worsening slurred speech, and lower extremities weakness.  -Neurology consulted.  -awaiting MRI brain  -check EEG.  -Will check ABG.   Addendum;  Discussed with Dr Lorraine Lax, ok to  hold on anticoagulation for Biopsy. Start coumadin tonight. No need for bridge due to A flutter.   Right upper lobe lung Mass;  Pulmonary following.  IR consulted for  Biopsy.   2-Post obstructive PNA; Continue with IV ceftriaxone and azithromycin.  Follow WBC, if increase will change antibiotics to zosyn.   3-Acute hypoxic respiratory failure secondary to number one.  Continue with  oxygen supplementation.   4-A flutter;  History of ablation in the past.  On coumadin. Holding coumadin in anticipation of biopsy.  INR supra-therapeutic at 10. Received a dose of vitamin K.  Plan to resume heparin after biopsy. Would like to have MRI results prior resuming heparin.   5-Cervical disc disease with myelopathy:  Patient has well described cervical and lumbar back problems he is on chronic pain medications which will be continued.  6-Chronic diastolic HF;  Appears compensated.   7-one of two blood culture positive for staph, coagulase. Likely contaminant  Follow culture.  Worsening leukocytosis, will repeat blood culture, start IV vancomycin     DVT prophylaxis: start heparin when INR less than 2.  Code Status: full code.  Family Communication:  Disposition Plan: Home when stable.   Consultants:   Pulmonology    Procedures:     Antimicrobials:  Ceftriaxone 7-21 Azithromycin 7-21  Subjective: He is alert, speech is more slurred.  Denies dyspnea. Denies headaches.    Objective: Vitals:   12/31/17 0838 12/31/17 1522 01/01/18 0042 01/01/18 0500  BP: 107/63 (!) 99/56 112/63   Pulse: 75 70 71   Resp: (!) 32 (!) 30    Temp: 98.3 F (36.8 C) 98.3 F (36.8 C) 98.2 F (36.8 C)   TempSrc: Oral Oral Oral   SpO2: 97% 97% 96%   Weight:    65.4 kg (144 lb 2.9 oz)  Height:        Intake/Output Summary (Last 24 hours) at 01/01/2018 0721 Last data filed at 01/01/2018 0500 Gross per 24 hour  Intake 2034.23 ml  Output 500 ml  Net 1534.23 ml   Filed Weights   12/30/17 0845 01/01/18 0500  Weight: 60.3 kg (133 lb) 65.4 kg (144 lb 2.9 oz)    Examination:  General exam: chronic ill appearing.  Respiratory system: Normal respiratory effort, crackles bases.  Cardiovascular system: S 1, S 2 RRR Gastrointestinal system: BS present, soft, nt Central nervous system: Alert, confused, speech slurred, upper extremity 5/5, unable to raise bilateral Lower  extremities.  Extremities:no edema Skin: No rashes, lesions or ulcers     Data Reviewed: I have personally reviewed following labs and imaging studies  CBC: Recent Labs  Lab 12/30/17 0659 12/31/17 0312  WBC 22.9* 24.6*  NEUTROABS 18.0*  --   HGB 13.2 12.4*  HCT 41.7 38.1*  MCV 96.5 94.1  PLT 287 921   Basic Metabolic Panel: Recent Labs  Lab 12/30/17 0659 12/31/17 0312  NA 143 142  K 3.7 3.6  CL 110 112*  CO2 20* 22  GLUCOSE 117* 142*  BUN 28* 28*  CREATININE 1.23 0.97  CALCIUM 8.2* 8.3*   GFR: Estimated Creatinine Clearance: 52.4 mL/min (by C-G formula based on SCr of 0.97 mg/dL). Liver Function Tests: Recent Labs  Lab 12/30/17 0659  AST 16  ALT 15  ALKPHOS 67  BILITOT 0.9  PROT 5.2*  ALBUMIN 2.1*   No results for input(s): LIPASE, AMYLASE in the last 168 hours. No results for input(s): AMMONIA in the last 168 hours. Coagulation Profile: Recent Labs  Lab 12/30/17  1024 12/31/17 0312 01/01/18 0202  INR >10.00* 3.87 1.61   Cardiac Enzymes: No results for input(s): CKTOTAL, CKMB, CKMBINDEX, TROPONINI in the last 168 hours. BNP (last 3 results) No results for input(s): PROBNP in the last 8760 hours. HbA1C: No results for input(s): HGBA1C in the last 72 hours. CBG: No results for input(s): GLUCAP in the last 168 hours. Lipid Profile: No results for input(s): CHOL, HDL, LDLCALC, TRIG, CHOLHDL, LDLDIRECT in the last 72 hours. Thyroid Function Tests: Recent Labs    12/30/17 1024  TSH 1.922   Anemia Panel: No results for input(s): VITAMINB12, FOLATE, FERRITIN, TIBC, IRON, RETICCTPCT in the last 72 hours. Sepsis Labs: Recent Labs  Lab 12/30/17 0754 12/30/17 1038  LATICACIDVEN 2.05* 1.66    Recent Results (from the past 240 hour(s))  Blood Culture (routine x 2)     Status: None (Preliminary result)   Collection Time: 12/30/17  7:55 AM  Result Value Ref Range Status   Specimen Description BLOOD RIGHT HAND  Final   Special Requests   Final     BOTTLES DRAWN AEROBIC AND ANAEROBIC Blood Culture adequate volume   Culture  Setup Time   Final    GRAM POSITIVE COCCI AEROBIC BOTTLE ONLY CRITICAL RESULT CALLED TO, READ BACK BY AND VERIFIED WITH: Burman Foster [id number] MLM Performed at [facility], [place] [address] [place], [place] [address]    Culture GRAM POSITIVE COCCI  Final   Report Status PENDING  Incomplete  Blood Culture ID Panel (Reflexed)     Status: Abnormal   Collection Time: 12/30/17  7:55 AM  Result Value Ref Range Status   Enterococcus species NOT DETECTED NOT DETECTED Final   Listeria monocytogenes NOT DETECTED NOT DETECTED Final   Staphylococcus species DETECTED (A) NOT DETECTED Final    Comment: Methicillin (oxacillin) susceptible coagulase negative staphylococcus. Possible blood culture contaminant (unless isolated from more than one blood culture draw or clinical case suggests pathogenicity). No antibiotic treatment is indicated for blood  culture contaminants. CRITICAL RESULT CALLED TO, READ BACK BY AND VERIFIED WITH: PHARMD M TURNER [id number] MLM    Staphylococcus aureus NOT DETECTED NOT DETECTED Final   Methicillin resistance NOT DETECTED NOT DETECTED Final   Streptococcus species NOT DETECTED NOT DETECTED Final   Streptococcus agalactiae NOT DETECTED NOT DETECTED Final   Streptococcus pneumoniae NOT DETECTED NOT DETECTED Final   Streptococcus pyogenes NOT DETECTED NOT DETECTED Final   Acinetobacter baumannii NOT DETECTED NOT DETECTED Final   Enterobacteriaceae species NOT DETECTED NOT DETECTED Final   Enterobacter cloacae complex NOT DETECTED NOT DETECTED Final   Escherichia coli NOT DETECTED NOT DETECTED Final   Klebsiella oxytoca NOT DETECTED NOT DETECTED Final   Klebsiella pneumoniae NOT DETECTED NOT DETECTED Final   Proteus species NOT DETECTED NOT DETECTED Final   Serratia marcescens NOT DETECTED NOT DETECTED Final   Haemophilus influenzae NOT DETECTED NOT DETECTED Final   Neisseria  meningitidis NOT DETECTED NOT DETECTED Final   Pseudomonas aeruginosa NOT DETECTED NOT DETECTED Final   Candida albicans NOT DETECTED NOT DETECTED Final   Candida glabrata NOT DETECTED NOT DETECTED Final   Candida krusei NOT DETECTED NOT DETECTED Final   Candida parapsilosis NOT DETECTED NOT DETECTED Final   Candida tropicalis NOT DETECTED NOT DETECTED Final    Comment: Performed at [facility], [address] [place], [place] [address]  Blood Culture (routine x 2)     Status: None (Preliminary result)   Collection Time: 12/30/17  8:00 AM  Result Value Ref Range Status   Specimen Description BLOOD LEFT ANTECUBITAL  Final   Special Requests   Final    BOTTLES DRAWN AEROBIC AND ANAEROBIC Blood Culture adequate volume   Culture   Final    NO GROWTH 1 DAY Performed at [facility], [address] [place], [place] [address]    Report Status PENDING  Incomplete         Radiology Studies: Ct Head Wo Contrast  Result Date: 01/01/2018 CLINICAL DATA:  Speech difficulties.  History of stroke. EXAM: CT HEAD WITHOUT CONTRAST TECHNIQUE: Contiguous axial images were obtained from the base of the skull through the vertex without intravenous contrast. COMPARISON:  04/28/2014 FINDINGS: Brain: Diffuse cerebral atrophy. Ventricular dilatation consistent with central atrophy. Cavum septum pellucidum. Low-attenuation changes throughout the deep white matter consistent with small vessel ischemia. No mass-effect or midline shift. No abnormal extra-axial fluid collections. Gray-white matter junctions are distinct. Basal cisterns are not effaced. No acute intracranial hemorrhage. Vascular: Intracranial arterial vascular calcifications are present. Skull: Calvarium appears intact. Sinuses/Orbits: Paranasal sinuses and mastoid air cells are clear. Other: None. IMPRESSION: No acute intracranial abnormalities. Chronic atrophy and small vessel ischemic changes. Electronically Signed   By: Lucienne Capers M.D.   On: 01/01/2018 02:03   Ct Chest W Contrast  Result Date: 12/30/2017 CLINICAL DATA:  Chest pain and shortness-of-breath. EXAM: CT CHEST WITH CONTRAST TECHNIQUE: Multidetector CT imaging of the chest was performed during intravenous contrast administration. CONTRAST:  90mL OMNIPAQUE IOHEXOL 300 MG/ML  SOLN COMPARISON:  Chest x-ray today as well as chest CT 04/16/2012 FINDINGS: Cardiovascular: Heart is normal size. Calcified plaque over the left anterior descending coronary artery. Minimal calcified plaque over the thoracic aorta. Prominence of the main pulmonary arteries. No evidence of pulmonary embolism. Mild compression of the SVC due to the right upper lobe lung mass as well as mediastinal adenopathy. Right upper lobe pulmonary artery is also mildly compressed by the adjacent adenopathy and right upper lobe mass. Mediastinum/Nodes: Extensive mediastinal adenopathy. Right superior mediastinal lymph node measures 2.6 cm by short axis immediately below the thyroid gland. Large right paratracheal/pretracheal lymph node measures 3.5 cm by short axis. Subcarinal lymph node measures 2.6 cm by short axis. Right hilar adenopathy appears to be confluent with the right upper lobe mass. Small lymph nodes along the right pericardiophrenic region and adjacent the distal esophagus. Lungs/Pleura: Examination demonstrates complete collapse of the right upper lobe with obstructed right upper lobe bronchus. Findings suggest a large right upper lobe lung mass with borders difficult to define, but measuring approximately 6.1 x 8.2 cm with minimal mass effect on the adjacent SVC and right upper lobe pulmonary artery. This favors primary bronchogenic carcinoma. This mass abuts the suprahilar region. There is loculated fluid over the right upper lobe/apex. There are numerous bilateral pulmonary nodules compatible with metastatic disease. Small amount of pleural fluid in the right base. Upper Abdomen: Numerous  mesenteric, retroperitoneal and peritoneal masses compatible metastatic disease with the largest over the left upper quadrant measuring 6.4 x 7.7 cm. Bilateral adrenal masses likely metastatic disease. Somewhat nodular wall thickening of the gallbladder as cannot exclude metastatic disease. Minimal calcified plaque over the abdominal aorta. Musculoskeletal: Curvature of the thoracic spine convex right. Degenerative change of the spine. IMPRESSION: Large right upper lobe mass extending towards the suprahilar region measuring approximately 6.1 x 8.2 cm concerning for primary bronchogenic carcinoma. This causes obstruction of the right upper lobe bronchus with mild mass effect  on the SVC and right upper lobe pulmonary artery. Small associated right pleural effusion. Extensive associated mediastinal and right hilar adenopathy. Numerous bilateral pulmonary nodules compatible with metastatic disease. Numerous mesenteric, retroperitoneal and peritoneal nodules/masses compatible with metastatic disease with the largest over the left upper quadrant measuring 7.7 cm. Bilateral adrenal masses likely metastatic disease. Nodular wall thickening of the gallbladder suggesting metastatic disease. Minimal atherosclerotic coronary artery disease. Aortic Atherosclerosis (ICD10-I70.0). Electronically Signed   By: Marin Olp M.D.   On: 12/30/2017 10:14        Scheduled Meds: . atorvastatin  20 mg Oral q1800  . doxazosin  4 mg Oral Daily  . feeding supplement (ENSURE ENLIVE)  237 mL Oral BID BM  . ferrous sulfate  325 mg Oral BID PC  . finasteride  5 mg Oral Daily  . multivitamin with minerals  1 tablet Oral Daily  . phenytoin  300 mg Oral QHS  . sodium chloride flush  3 mL Intravenous Q12H  . tamsulosin  0.4 mg Oral BID   Continuous Infusions: . sodium chloride    . 0.9 % NaCl with KCl 20 mEq / L 75 mL/hr at 01/01/18 0556  . azithromycin Stopped (12/31/17 1604)  . cefTRIAXone (ROCEPHIN)  IV Stopped (12/31/17  1334)     LOS: 2 days    Time spent: 35 minutes.     Elmarie Shiley, MD [facility] Pager [phone]  If 7PM-7AM, please contact night-coverage [email or web address] Password [facility] 01/01/2018, 7:21 AM

## 2018-01-01 NOTE — Progress Notes (Signed)
EEG complete - results pending 

## 2018-01-01 NOTE — Procedures (Signed)
Interventional Radiology Procedure Note  Procedure: US guided core biopsy omental mass  Complications: None  Estimated Blood Loss: None  Recommendations: - Return to room - Path pending  Signed,  Criselda Peaches, MD

## 2018-01-01 NOTE — Procedures (Signed)
ELECTROENCEPHALOGRAM REPORT   Patient: Jay Walters       Room #: 0F00F EEG No. ID: [id number] Age: 84 y.o.        Sex: male Referring Physician: Regalado Report Date:  01/01/2018        Interpreting Physician: Alexis Goodell  History: [name] is an 84 y.o. male with a history of seizures  Medications:  Lipitor, Zithromax, Cardura, Ferrous Sulfate, Proscar, MVI, Dilantin, Zosyn, Flomax  Conditions of Recording:  This is a 21 channel routine scalp EEG performed with bipolar and monopolar montages arranged in accordance to the international 10/20 system of electrode placement. One channel was dedicated to EKG recording.  The patient is in the awake and drowsy states.  Description:  The waking background activity consists of a low voltage, symmetrical, fairly well organized, 7 Hz theta activity, seen from the parieto-occipital and posterior temporal regions.  Low voltage fast activity, poorly organized, is seen anteriorly and is at times superimposed on more posterior regions.  A mixture of theta and alpha rhythms are seen from the central and temporal regions. The patient drowses with slowing to irregular, low voltage theta and beta activity.  Vertex central sharp transients of sleep are noted as well.   No epileptiform activity is noted.   Hyperventilation and intermittent photic stimulation were not performed.   IMPRESSION: This is an abnormal EEG secondary to posterior background slowing.  This finding may be seen with a diffuse gray matter disturbance that is etiologically nonspecific, but may include a dementia, among other possibilities.  No epileptiform activity is noted.     Alexis Goodell, MD Neurology [phone] 01/01/2018, 12:26 PM

## 2018-01-01 NOTE — Progress Notes (Signed)
Pharmacy Antibiotic Note Jay Walters is a 84 y.o. male admitted on 12/30/2017 with R upper lobe mass and concern for post obstructive PNA. Initially started on ceftriaxone and azithromycin but to change to Zosyn to increase in WBC.   Plan: Zosyn 3.375g IV q8h (4 hour infusion).  Height: 5\' 8"  (172.7 cm) Weight: 144 lb 2.9 oz (65.4 kg) IBW/kg (Calculated) : 68.4  Temp (24hrs), Avg:98.3 F (36.8 C), Min:98.2 F (36.8 C), Max:98.3 F (36.8 C)  Recent Labs  Lab 12/30/17 0659 12/30/17 0754 12/30/17 1038 12/31/17 0312 01/01/18 0803  WBC 22.9*  --   --  24.6* 28.7*  CREATININE 1.23  --   --  0.97 0.90  LATICACIDVEN  --  2.05* 1.66  --   --      No Known Allergies  Antimicrobials this admission:  Azithromycin IV 7/21>>  Zosyn 7/23 >>  Ceftriaxone /21>>7/23  Microbiology results: 7/21 Blood: 1/2 GPCs, Staph species on BCID  Thank you for allowing pharmacy to be a part of this patient's care.  Vincenza Hews, PharmD, BCPS 01/01/2018, 10:32 AM

## 2018-01-01 NOTE — Progress Notes (Addendum)
ANTICOAGULATION CONSULT NOTE   Pharmacy Consult for warfarin Indication: atrial fibrillation and acute CVA  No Known Allergies  Patient Measurements: Height: 5\' 8"  (172.7 cm) Weight: 144 lb 2.9 oz (65.4 kg) IBW/kg (Calculated) : 68.4  Vital Signs: BP: 103/46 (07/23 1535) Pulse Rate: 66 (07/23 1535)  Labs: Recent Labs    12/30/17 0659 12/30/17 1024 12/31/17 0312 01/01/18 0202 01/01/18 0803  HGB 13.2  --  12.4*  --  12.4*  HCT 41.7  --  38.1*  --  39.3  PLT 287  --  232  --  227  LABPROT  --  >90.0* 37.7* 19.0*  --   INR  --  >10.00* 3.87 1.61  --   CREATININE 1.23  --  0.97  --  0.90    Estimated Creatinine Clearance: 56.5 mL/min (by C-G formula based on SCr of 0.9 mg/dL).   Medical History: Past Medical History:  Diagnosis Date  . Anemia   . Arthritis    "right hip; right knee; lower back ~ 1/2 way across"  . Atrial fibrillation ([place])    remotely   . Atrial flutter ([place])    typical appearing by ekg. s/p ablation 07/28/11  . Diverticulosis   . Dysphagia    with solids  . Esophageal stricture   . GERD (gastroesophageal reflux disease)   . H/O hiatal hernia   . Heart murmur   . Hemorrhoids   . High cholesterol   . Hypertension   . Osteopenia   . Seizures ([place])    "back w/migraine headaches; 1990's or before"  . Sinus bradycardia    asymptomatic  . SSS (sick sinus syndrome) ([place])   . Stroke [facility]) 2012   "left me weaker on left side; balance is not good"    Medications:  Scheduled:  . atorvastatin  20 mg Oral q1800  . doxazosin  4 mg Oral Daily  . feeding supplement (ENSURE ENLIVE)  237 mL Oral BID BM  . ferrous sulfate  325 mg Oral BID PC  . finasteride  5 mg Oral Daily  . lidocaine      . midazolam      . multivitamin with minerals  1 tablet Oral Daily  . phenytoin  300 mg Oral QHS  . sodium chloride flush  3 mL Intravenous Q12H  . tamsulosin  0.4 mg Oral BID    Assessment: 70 YOM with PMH of HTN, stroke, and afib on warfarin PTA who  presented on 7/21 with cough and SOB. INR > 10 on admission and given vitamin K for reversal. Worsening AMS on 7/23 and MRI significant for bilateral acute nonhemorrhagic occipital lobe infarcts.   Underwent US guided core omental mass biopsy today with no estimated blood loss. Based on IR protocol post procedure based on standard bleeding risk, okay to resume warfarin therapy evening of the procedure.   INR this morning 1.61. Discussed with medical teams and okay to restart warfarin and hold on heparin initiation at this time given indication of atrial flutter. INR repeated this afternoon is 1.57, drop likely secondary to held warfarin doses and 10 mg vitamin K on 7/21. Hgb stable at 12.4, plt 227. No s/sx of bleeding. On concurrent phenytoin (PTA med) and azithromycin, which can impact warfarin sensitivity.   Home regimen: 2.5 mg every Sun, Tue, Thu; 5 mg all other days  Goal of Therapy:  INR 2-3 Monitor platelets by anticoagulation protocol: Yes   Plan:  Resume warfarin 3 mg tonight Monitor daily INR  and CBC Monitor for signs/symptms of bleeding   Doylene Canard, PharmD Clinical Pharmacist  Pager: [phone] Phone: [phone] 01/01/2018, 4:12 PM

## 2018-01-01 NOTE — Consult Note (Signed)
Chief Complaint: Patient was seen in consultation today for LUQ mass biopsy Chief Complaint  Patient presents with  . Chest Pain  . Weakness  . Shortness of Breath  . Fever   at the request of Dr Rudolpho Sevin  Supervising Physician: Jacqulynn Cadet  Patient Status: [facility] - In-pt  History of Present Illness: Jay Walters is a 84 y.o. male   HTN; CVA; CBP: Afib(coumadin INR over 10 on admission) To ED 7/21 with cough; SOB; and fever 1 week Wt loss Work up  Revealed: CT IMPRESSION: Large right upper lobe mass extending towards the suprahilar region measuring approximately 6.1 x 8.2 cm concerning for primary bronchogenic carcinoma. This causes obstruction of the right upper lobe bronchus with mild mass effect on the SVC and right upper lobe pulmonary artery. Small associated right pleural effusion. Extensive associated mediastinal and right hilar adenopathy. Numerous bilateral pulmonary nodules compatible with metastatic disease. Numerous mesenteric, retroperitoneal and peritoneal nodules/masses compatible with metastatic disease with the largest over the left upper quadrant measuring 7.7 cm. Bilateral adrenal masses likely metastatic disease.  Recent slurred speech Neuro has consulted Impression:  - Subacute infarcts in bilateral occipital lobes-which is likely a incidental finding and has no correlation with his bilateral lower extremity weakness and or confusion - Sepsis - Large right upper lobe mass concerning for primary bronchogenic carcinoma - Probable underlying neurocognitive decline  INR now 1.6 Request made for biopsy LUQ mass biopsy approved with Dr Anselm Pancoast    Past Medical History:  Diagnosis Date  . Anemia   . Arthritis    "right hip; right knee; lower back ~ 1/2 way across"  . Atrial fibrillation ([place])    remotely   . Atrial flutter ([place])    typical appearing by ekg. s/p ablation 07/28/11  . Diverticulosis   . Dysphagia    with solids  .  Esophageal stricture   . GERD (gastroesophageal reflux disease)   . H/O hiatal hernia   . Heart murmur   . Hemorrhoids   . High cholesterol   . Hypertension   . Osteopenia   . Seizures ([place])    "back w/migraine headaches; 1990's or before"  . Sinus bradycardia    asymptomatic  . SSS (sick sinus syndrome) ([place])   . Stroke [facility]) 2012   "left me weaker on left side; balance is not good"    Past Surgical History:  Procedure Laterality Date  . ASD REPAIR  1972   Patch repair  . ATRIAL FLUTTER ABLATION N/A 07/28/2011   Procedure: ATRIAL FLUTTER ABLATION;  Surgeon: Thompson Grayer, MD;  Location: [facility] CATH LAB;  Service: Cardiovascular;  Laterality: N/A;  . CARDIAC ELECTROPHYSIOLOGY STUDY AND ABLATION  07/28/11  . CARDIOVASCULAR STRESS TEST  03/07/2010   Perfusion defect in the inferior myocardial region is consistent with diaphragmatic attenuation. No ischemia or infarct/scar is seen in the remaining myocardium. No ECG changes.  . CAROTID DOPPLER  06/26/2011   Bilateral ICAs-demonstrated normal patency without eivdence of significant diameter reduction, dissection, or any other vascular abnormality.  . INGUINAL HERNIA REPAIR  2011   right  . JOINT REPLACEMENT     right knee replacement  . KNEE ARTHROSCOPY     right; "maybe twice"  . TOTAL KNEE ARTHROPLASTY  07/10/2012   Procedure: TOTAL KNEE ARTHROPLASTY;  Surgeon: Tobi Bastos, MD;  Location: [facility] ORS;  Service: Orthopedics;  Laterality: Right;  . TRANSTHORACIC ECHOCARDIOGRAM  12/10/2012   EF 55-60%. No regional wall motion abnormalities. LA moderately dialted.Mild-moderate  regurg of the tricuspid valve.    Allergies: Patient has no known allergies.  Medications: Prior to Admission medications   Medication Sig Start Date End Date Taking? Authorizing Provider  acetaminophen (TYLENOL) 500 MG tablet Take 1,000 mg by mouth every 6 (six) hours as needed for mild pain or moderate pain.   Yes [provider]  atorvastatin (LIPITOR)  20 MG tablet TAKE 1 TABLET DAILY 12/12/17  Yes Baxley, Cresenciano Lick, MD  doxazosin (CARDURA) 4 MG tablet TAKE 1 TABLET DAILY 12/07/16  Yes Baxley, Cresenciano Lick, MD  ferrous sulfate 325 (65 FE) MG tablet Take 325 mg by mouth 2 (two) times daily after a meal.    Yes [provider]  finasteride (PROSCAR) 5 MG tablet Take 1 tablet (5 mg total) by mouth daily. 12/07/16  Yes Baxley, Cresenciano Lick, MD  furosemide (LASIX) 40 MG tablet Take 1 tablet (40 mg total) by mouth as needed. 10/25/17  Yes Baxley, Cresenciano Lick, MD  HYDROcodone-acetaminophen (NORCO) 10-325 MG tablet Take 1 tablet by mouth as needed. 11/13/17  Yes [provider]  Multiple Vitamin (MULTIVITAMIN WITH MINERALS) TABS tablet Take 1 tablet by mouth daily.   Yes [provider]  phenytoin (DILANTIN) 100 MG ER capsule TAKE 3 CAPSULES AT BEDTIME 12/12/17  Yes Baxley, Cresenciano Lick, MD  Polyethyl Glycol-Propyl Glycol (SYSTANE ULTRA) 0.4-0.3 % SOLN Place 1 drop into both eyes daily.   Yes [provider]  tamsulosin (FLOMAX) 0.4 MG CAPS capsule Take 1 capsule (0.4 mg total) by mouth 2 (two) times daily. 12/07/16  Yes Baxley, Cresenciano Lick, MD  warfarin (COUMADIN) 5 MG tablet TAKE ONE-HALF (1/2) TO ONE TABLET DAILY AS DIRECTED BY COUMADIN CLINIC. NEED INR APPOINTMENT PRIOR TO NEXT REFILL AUTHORIZATION. Patient taking differently: Take 2.5-5 mg by mouth one time only at 6 PM. Take 2.5 mg on Tuesday, thursday, Sunday 5 mg Monday, wedsesday, Friday, saturday 11/20/17  Yes Hilty, Nadean Corwin, MD     Family History  Problem Relation Age of Onset  . Heart disease Father   . Cancer Sister        unsure what type    Social History   Socioeconomic History  . Marital status: Married    Spouse name: Not on file  . Number of children: 2  . Years of education: Not on file  . Highest education level: Not on file  Occupational History  . Occupation: retired but still works Programmer, applications: [employer]  Social Needs  . Financial resource  strain: Not on file  . Food insecurity:    Worry: Not on file    Inability: Not on file  . Transportation needs:    Medical: Not on file    Non-medical: Not on file  Tobacco Use  . Smoking status: Never Smoker  . Smokeless tobacco: Never Used  Substance and Sexual Activity  . Alcohol use: Yes    Comment: 07/28/11 "1/5 will last me a year; w/company"  . Drug use: No  . Sexual activity: Not Currently  Lifestyle  . Physical activity:    Days per week: Not on file    Minutes per session: Not on file  . Stress: Not on file  Relationships  . Social connections:    Talks on phone: Not on file    Gets together: Not on file    Attends religious service: Not on file    Active member of club or organization: Not on file  Attends meetings of clubs or organizations: Not on file    Relationship status: Not on file  Other Topics Concern  . 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  Has had no significant occupational exposure to fumes, fibers, or dust.    Review of Systems: A 12 point ROS discussed and pertinent positives are indicated in the HPI above.  All other systems are negative.  Review of Systems  Constitutional: Positive for activity change, fatigue and unexpected weight change. Negative for fever.  Respiratory: Positive for cough and shortness of breath.   Cardiovascular: Negative for chest pain.  Gastrointestinal: Positive for abdominal pain.  Musculoskeletal: Positive for back pain and gait problem.  Neurological: Positive for weakness.  Psychiatric/Behavioral: Positive for confusion and decreased concentration. Negative for behavioral problems.    Vital Signs: BP 112/63 (BP Location: Right Arm)   Pulse 71   Temp 98.2 F (36.8 C) (Oral)   Resp (!) 30   Ht 5\' 8"  (1.727 m)   Wt 144 lb 2.9 oz (65.4 kg)   SpO2 96%   BMI 21.92 kg/m   Physical Exam  Cardiovascular: Normal rate. An irregularly  irregular rhythm present.  Pulmonary/Chest: Effort normal and breath sounds normal. No respiratory distress.  Abdominal: Soft. Bowel sounds are normal. There is no tenderness.  Musculoskeletal:  LE weakness  Neurological: He is alert.  Skin: Skin is warm and dry.  Psychiatric:  Will gain consent from wife  Nursing note and vitals reviewed.   Imaging: Dg Chest 2 View  Result Date: 12/30/2017 CLINICAL DATA:  Shortness of breath several weeks. EXAM: CHEST - 2 VIEW COMPARISON:  02/02/2015 FINDINGS: New confluent consolidation over the right upper lobe with suggestion of pleural fluid over the right apex. Mild tracheal deviation to the right. Left lung is clear. Cardiac silhouette is within normal. Remainder the exam is unchanged. IMPRESSION: New right upper lobe collapse with possible loculated right apical pleural fluid. Recommend chest CT with contrast for further evaluation to exclude central obstructing lesion. Electronically Signed   By: Marin Olp M.D.   On: 12/30/2017 07:42   Ct Head Wo Contrast  Result Date: 01/01/2018 CLINICAL DATA:  Speech difficulties.  History of stroke. EXAM: CT HEAD WITHOUT CONTRAST TECHNIQUE: Contiguous axial images were obtained from the base of the skull through the vertex without intravenous contrast. COMPARISON:  04/28/2014 FINDINGS: Brain: Diffuse cerebral atrophy. Ventricular dilatation consistent with central atrophy. Cavum septum pellucidum. Low-attenuation changes throughout the deep white matter consistent with small vessel ischemia. No mass-effect or midline shift. No abnormal extra-axial fluid collections. Gray-white matter junctions are distinct. Basal cisterns are not effaced. No acute intracranial hemorrhage. Vascular: Intracranial arterial vascular calcifications are present. Skull: Calvarium appears intact. Sinuses/Orbits: Paranasal sinuses and mastoid air cells are clear. Other: None. IMPRESSION: No acute intracranial abnormalities. Chronic atrophy  and small vessel ischemic changes. Electronically Signed   By: Lucienne Capers M.D.   On: 01/01/2018 02:03   Ct Chest W Contrast  Result Date: 12/30/2017 CLINICAL DATA:  Chest pain and shortness-of-breath. EXAM: CT CHEST WITH CONTRAST TECHNIQUE: Multidetector CT imaging of the chest was performed during intravenous contrast administration. CONTRAST:  45mL OMNIPAQUE IOHEXOL 300 MG/ML  SOLN COMPARISON:  Chest x-ray today as well as chest CT 04/16/2012 FINDINGS: Cardiovascular: Heart is normal size. Calcified plaque over the left anterior descending coronary artery. Minimal calcified plaque over the thoracic aorta. Prominence of the  main pulmonary arteries. No evidence of pulmonary embolism. Mild compression of the SVC due to the right upper lobe lung mass as well as mediastinal adenopathy. Right upper lobe pulmonary artery is also mildly compressed by the adjacent adenopathy and right upper lobe mass. Mediastinum/Nodes: Extensive mediastinal adenopathy. Right superior mediastinal lymph node measures 2.6 cm by short axis immediately below the thyroid gland. Large right paratracheal/pretracheal lymph node measures 3.5 cm by short axis. Subcarinal lymph node measures 2.6 cm by short axis. Right hilar adenopathy appears to be confluent with the right upper lobe mass. Small lymph nodes along the right pericardiophrenic region and adjacent the distal esophagus. Lungs/Pleura: Examination demonstrates complete collapse of the right upper lobe with obstructed right upper lobe bronchus. Findings suggest a large right upper lobe lung mass with borders difficult to define, but measuring approximately 6.1 x 8.2 cm with minimal mass effect on the adjacent SVC and right upper lobe pulmonary artery. This favors primary bronchogenic carcinoma. This mass abuts the suprahilar region. There is loculated fluid over the right upper lobe/apex. There are numerous bilateral pulmonary nodules compatible with metastatic disease. Small  amount of pleural fluid in the right base. Upper Abdomen: Numerous mesenteric, retroperitoneal and peritoneal masses compatible metastatic disease with the largest over the left upper quadrant measuring 6.4 x 7.7 cm. Bilateral adrenal masses likely metastatic disease. Somewhat nodular wall thickening of the gallbladder as cannot exclude metastatic disease. Minimal calcified plaque over the abdominal aorta. Musculoskeletal: Curvature of the thoracic spine convex right. Degenerative change of the spine. IMPRESSION: Large right upper lobe mass extending towards the suprahilar region measuring approximately 6.1 x 8.2 cm concerning for primary bronchogenic carcinoma. This causes obstruction of the right upper lobe bronchus with mild mass effect on the SVC and right upper lobe pulmonary artery. Small associated right pleural effusion. Extensive associated mediastinal and right hilar adenopathy. Numerous bilateral pulmonary nodules compatible with metastatic disease. Numerous mesenteric, retroperitoneal and peritoneal nodules/masses compatible with metastatic disease with the largest over the left upper quadrant measuring 7.7 cm. Bilateral adrenal masses likely metastatic disease. Nodular wall thickening of the gallbladder suggesting metastatic disease. Minimal atherosclerotic coronary artery disease. Aortic Atherosclerosis (ICD10-I70.0). Electronically Signed   By: Marin Olp M.D.   On: 12/30/2017 10:14   Mr Brain Wo Contrast  Result Date: 01/01/2018 CLINICAL DATA:  Slurred speech and dizziness. Encephalopathy. Altered level of consciousness. EXAM: MRI HEAD WITHOUT CONTRAST TECHNIQUE: Multiplanar, multiecho pulse sequences of the brain and surrounding structures were obtained without intravenous contrast. COMPARISON:  CT head without contrast 01/01/2018. MRI brain 07/29/2012. FINDINGS: Brain: A focal cortical infarct is present the margin of the right occipital and parietal lobe. This infarct measures up to 17 mm.  A punctate left occipital lobe white matter infarct is present. T2 signal change is associated with both areas of acute infarct. There is no acute hemorrhage. Advanced atrophy and confluent white matter disease demonstrate some progression since 2014. Cavum septum pellucidum is incidentally noted. Dilated perivascular spaces are present. The internal auditory canals are within normal limits. The brainstem and cerebellum are normal. Remote infarcts are noted along the corpus callosum. Vascular: Flow is present in the major intracranial arteries. Skull and upper cervical spine: Skull base is within normal limits. Craniocervical junction is normal. Grade 1 anterolisthesis is present at C2-3. Chronic endplate changes are present at C3-4. The central canal is patent. Sinuses/Orbits: The paranasal sinuses and mastoid air cells are clear. Bilateral lens replacements are present. Globes and orbits are otherwise within normal limits.  IMPRESSION: 1. Bilateral acute nonhemorrhagic occipital lobe infarcts, right greater than left. There is cortical involvement on the right measuring up to 17 mm. The left occipital infarct is contain to the white matter. 2. Progression of advanced diffuse cerebral atrophy and white matter disease. Electronically Signed   By: San Morelle M.D.   On: 01/01/2018 09:29    Labs:  CBC: Recent Labs    07/17/17 1622 12/30/17 0659 12/31/17 0312 01/01/18 0803  WBC 5.5 22.9* 24.6* 28.7*  HGB 13.7 13.2 12.4* 12.4*  HCT 40.6 41.7 38.1* 39.3  PLT 214 287 232 227    COAGS: Recent Labs    11/01/17 1459 12/30/17 1024 12/31/17 0312 01/01/18 0202  INR 1.7* >10.00* 3.87 1.61    BMP: Recent Labs    07/17/17 1622  11/13/17 1407 12/30/17 0659 12/31/17 0312 01/01/18 0803  NA 143   < > 142 143 142 144  K 4.5   < > 4.6 3.7 3.6 4.3  CL 106   < > 108 110 112* 116*  CO2 23   < > 27 20* 22 19*  GLUCOSE 93   < > 96 117* 142* 105*  BUN 20   < > 20 28* 28* 27*  CALCIUM 9.3   < >  9.4 8.2* 8.3* 8.1*  CREATININE 0.86   < > 0.85 1.23 0.97 0.90  GFRNONAA 80  --   --  52* >60 >60  GFRAA 93  --   --  >60 >60 >60   < > = values in this interval not displayed.    LIVER FUNCTION TESTS: Recent Labs    07/17/17 1622 12/30/17 0659  BILITOT <0.2 0.9  AST 18 16  ALT 18 15  ALKPHOS 71 67  PROT 7.3 5.2*  ALBUMIN 4.1 2.1*    TUMOR MARKERS: No results for input(s): AFPTM, CEA, CA199, CHROMGRNA in the last 8760 hours.  Assessment and Plan:  Weakness; wt loss; SOB and fever Imaging reveals lung mass; LAN; and LUQ mass Scheduled for LUQ mass biopsy in IR Risks and benefits discussed with the patient's wife Cecelia via phone including, but not limited to bleeding, infection, damage to adjacent structures or low yield requiring additional tests.  All of her questions were answered, she is agreeable to proceed. Consent signed and in chart.    Thank you for this interesting consult.  I greatly enjoyed Amelia and look forward to participating in their care.  A copy of this report was sent to the requesting provider on this date.  Electronically Signed: Lavonia Drafts, PA-C 01/01/2018, 12:33 PM   I spent a total of 40 Minutes    in face to face in clinical consultation, greater than 50% of which was counseling/coordinating care for LUQ mass bx

## 2018-01-01 NOTE — Progress Notes (Signed)
Pt with worsening slurred speech and visual neglect. Per day shift RN, pt's family stated that the pt had new onset slurred speech and a STAT head CT was ordered. Pt never went down to CT on day shift. RRT was called to evaluate pt and CT was called to expedite the pt down to get CT completed.   RRT came up to floor to evaluate pt once pt back from CT, NIH worsening from 6 to 11.   CT negative, on call NP Baltazar Najjar notified and a STAT MRI brain was ordered, otherwise, no new orders, just advised to continue to monitor the pt for any changes.

## 2018-01-02 ENCOUNTER — Inpatient Hospital Stay (HOSPITAL_COMMUNITY): Payer: 59

## 2018-01-02 DIAGNOSIS — R0689 Other abnormalities of breathing: Secondary | ICD-10-CM

## 2018-01-02 DIAGNOSIS — C799 Secondary malignant neoplasm of unspecified site: Secondary | ICD-10-CM

## 2018-01-02 DIAGNOSIS — R0902 Hypoxemia: Secondary | ICD-10-CM

## 2018-01-02 LAB — PROTIME-INR
INR: 1.75
PROTHROMBIN TIME: 20.3 s — AB (ref 11.4–15.2)

## 2018-01-02 LAB — BASIC METABOLIC PANEL
ANION GAP: 9 (ref 5–15)
BUN: 29 mg/dL — ABNORMAL HIGH (ref 8–23)
CALCIUM: 8.3 mg/dL — AB (ref 8.9–10.3)
CO2: 20 mmol/L — ABNORMAL LOW (ref 22–32)
Chloride: 119 mmol/L — ABNORMAL HIGH (ref 98–111)
Creatinine, Ser: 1.09 mg/dL (ref 0.61–1.24)
GLUCOSE: 113 mg/dL — AB (ref 70–99)
POTASSIUM: 4.2 mmol/L (ref 3.5–5.1)
SODIUM: 148 mmol/L — AB (ref 135–145)

## 2018-01-02 LAB — ALBUMIN: ALBUMIN: 1.9 g/dL — AB (ref 3.5–5.0)

## 2018-01-02 LAB — CBC
HCT: 39.7 % (ref 39.0–52.0)
Hemoglobin: 12.4 g/dL — ABNORMAL LOW (ref 13.0–17.0)
MCH: 30.7 pg (ref 26.0–34.0)
MCHC: 31.2 g/dL (ref 30.0–36.0)
MCV: 98.3 fL (ref 78.0–100.0)
Platelets: 194 10*3/uL (ref 150–400)
RBC: 4.04 MIL/uL — AB (ref 4.22–5.81)
RDW: 16.5 % — AB (ref 11.5–15.5)
WBC: 29.1 10*3/uL — ABNORMAL HIGH (ref 4.0–10.5)

## 2018-01-02 LAB — CULTURE, BLOOD (ROUTINE X 2): Special Requests: ADEQUATE

## 2018-01-02 LAB — GLUCOSE, CAPILLARY: GLUCOSE-CAPILLARY: 100 mg/dL — AB (ref 70–99)

## 2018-01-02 LAB — PHENYTOIN LEVEL, TOTAL: PHENYTOIN LVL: 5.6 ug/mL — AB (ref 10.0–20.0)

## 2018-01-02 MED ORDER — SODIUM CHLORIDE 0.45 % IV SOLN
INTRAVENOUS | Status: DC
Start: 2018-01-02 — End: 2018-01-04
  Administered 2018-01-02 – 2018-01-04 (×5): via INTRAVENOUS

## 2018-01-02 MED ORDER — WARFARIN SODIUM 3 MG PO TABS
3.0000 mg | ORAL_TABLET | Freq: Once | ORAL | Status: AC
  Administered 2018-01-02: 3 mg via ORAL
  Filled 2018-01-02: qty 1

## 2018-01-02 MED ORDER — RESOURCE THICKENUP CLEAR PO POWD
ORAL | Status: DC | PRN
  Administered 2018-01-03: 09:00:00 via ORAL
  Filled 2018-01-02 (×2): qty 125

## 2018-01-02 MED ORDER — PHENYTOIN 50 MG PO CHEW
100.0000 mg | CHEWABLE_TABLET | Freq: Three times a day (TID) | ORAL | Status: DC
  Administered 2018-01-02 – 2018-01-04 (×5): 100 mg via ORAL
  Filled 2018-01-02 (×6): qty 2

## 2018-01-02 NOTE — Progress Notes (Addendum)
[facility] PCCM  S: Feels about the same, no new complaints  O:  Vitals:   01/01/18 1738 01/01/18 2000 01/02/18 0007 01/02/18 0743  BP: (!) 99/56  (!) 102/48 115/65  Pulse: 67  64 (!) 59  Resp: (!) 34 (!) 27 20 20   Temp: 98.4 F (36.9 C)  98.1 F (36.7 C) 97.7 F (36.5 C)  TempSrc: Oral  Oral Oral  SpO2: 99% 94% 97% 96%  Weight:      Height:       2 L [place]  General:  Resting comfortably in exam bed, NAD HENT: /AT, PERRL,EOM-I and MMM PULM: Decreased BS bilaterally CV: RRR, Nl S1/S2 and -M/R/G GI: Soft, NT, ND and +BS MSK: WNL Neuro: Awake and interactive, moving all ext to command  INR this morning 1.75  Impression/plan:  84 year old male with a RUL lung mass and multiple abdomina lesion.  I reviewed the CT myself, location is not particularly amenable to a non invasive bronchoscopy which means patient will need to be under general anesthesia.  Risk vs benefits for doing that vs abdominal lesion diagnosis is in favor of abdomen.  CT guided biopsy has been done and remains pending.  Discussed with PCCM-NP.  Lung mass: - Wait for abdominal biopsy results, if negative may need to consider alternative methods, please call back if negative and alteratives are needed.  Hypoxemia: - Titrate O2 for sat of 88-92% - May need an ambulatory desaturation study prior to discharge for home O2  Coagulopathy: - Vitamin K - No FFP - F/U INR as needed.  GOC: pending results may need to consider a more palliative approach.  PCCM will signing off, please call back if needed.  Patient seen and examined, agree with above note.  I dictated the care and orders written for this patient under my direction.  Rush Farmer, [place]

## 2018-01-02 NOTE — Progress Notes (Signed)
Modified Barium Swallow Progress Note  Patient Details  Name: Jay Walters MRN: [id number] Date of Birth: 09-Aug-1931  Today's Date: 01/02/2018  Modified Barium Swallow completed.  Full report located under Chart Review in the Imaging Section.  Brief recommendations include the following:  Clinical Impression  Pt has a moderate oropharyngeal dysphagia that may be an acute exacerbation of a likely chronic dysphagia given structural component. Orally he has weak lingual manipulation, tongue pumping, and slow transit. Rather than containing cohesive boluses he allows liquids/solids to spill posteriorly into  the pharynx as his tongue is pumping, which contributes partially to his multiple swallows. Pt does also have large suspected osteophytes (MD not present to confirm), most prominent at C4-5, C5-6, that impacts bolus flow and epiglottic deflection. He has moderate vallecular residue, which may also contribute to his multiple swallows in an attempt to reduce residue. He has aspiration of thin liquids that occurs repeatedly as he continues to swallow a bolus. He says he cannot tuck his chin because he cannot move his neck that way. Limiting bolus size to teaspoon helps to better contain the thin liquids above the laryngeal vestibule, preventing further penetration/aspiration. Pt has better airway protection with thicker substances, but his residue increases as well. Once the vallecula is full from soft solids, nectar thick liquids start to penetrate into the vestibule. Recommend Dys 1 diet and nectar thick liquids. SLP will f/u to determine if water via provale cup may be appropriate in between meals. Pt would benefit from SLP f/u for utilization of strategies, strengthening to maximize function in light of structural component.   Swallow Evaluation Recommendations       SLP Diet Recommendations: Dysphagia 1 (Puree) solids;Nectar thick liquid   Liquid Administration via: Cup;No straw   Medication  Administration: Crushed with puree   Supervision: Full supervision/cueing for compensatory strategies;Staff to assist with self feeding   Compensations: Slow rate;Small sips/bites;Follow solids with liquid   Postural Changes: Remain semi-upright after after feeds/meals (Comment);Seated upright at 90 degrees   Oral Care Recommendations: Oral care BID        Germain Osgood 01/02/2018,12:36 PM   Germain Osgood, M.A. CCC-SLP [phone]

## 2018-01-02 NOTE — Progress Notes (Signed)
Pt has only urinated 325 mL overnight. A bladder scan was completed and showed 575 in the pt's bladder. The pt states that they do not feel like they have to urinate. On call NP Baltazar Najjar paged about potential need for in and out cath. Awaiting response.

## 2018-01-02 NOTE — Care Management Important Message (Signed)
Important Message  Patient Details  Name: Jay Walters MRN: [id number] Date of Birth: 01/18/1932   Medicare Important Message Given:  No Do to illness patient not able to sign/Unsigned copy left   [name] 01/02/2018, 4:06 PM

## 2018-01-02 NOTE — Evaluation (Signed)
Clinical/Bedside Swallow Evaluation Patient Details  Name: Jay Walters MRN: [id number] Date of Birth: 26-Oct-1931  Today's Date: 01/02/2018 Time: SLP Start Time (ACUTE ONLY): 2979 SLP Stop Time (ACUTE ONLY): 0915 SLP Time Calculation (min) (ACUTE ONLY): 23 min  Past Medical History:  Past Medical History:  Diagnosis Date  . Anemia   . Arthritis    "right hip; right knee; lower back ~ 1/2 way across"  . Atrial fibrillation ([place])    remotely   . Atrial flutter ([place])    typical appearing by ekg. s/p ablation 07/28/11  . Diverticulosis   . Dysphagia    with solids  . Esophageal stricture   . GERD (gastroesophageal reflux disease)   . H/O hiatal hernia   . Heart murmur   . Hemorrhoids   . High cholesterol   . Hypertension   . Osteopenia   . Seizures ([place])    "back w/migraine headaches; 1990's or before"  . Sinus bradycardia    asymptomatic  . SSS (sick sinus syndrome) ([place])   . Stroke [facility]) 2012   "left me weaker on left side; balance is not good"   Past Surgical History:  Past Surgical History:  Procedure Laterality Date  . ASD REPAIR  1972   Patch repair  . ATRIAL FLUTTER ABLATION N/A 07/28/2011   Procedure: ATRIAL FLUTTER ABLATION;  Surgeon: Thompson Grayer, MD;  Location[facility] LAB;  Service: Cardiovascular;  Laterality: N/A;  . CARDIAC ELECTROPHYSIOLOGY STUDY AND ABLATION  07/28/11  . CARDIOVASCULAR STRESS TEST  03/07/2010   Perfusion defect in the inferior myocardial region is consistent with diaphragmatic attenuation. No ischemia or infarct/scar is seen in the remaining myocardium. No ECG changes.  . CAROTID DOPPLER  06/26/2011   Bilateral ICAs-demonstrated normal patency without eivdence of significant diameter reduction, dissection, or any other vascular abnormality.  . INGUINAL HERNIA REPAIR  2011   right  . IR US GUIDE BX ASP/DRAIN  01/01/2018  . JOINT REPLACEMENT     right knee replacement  . KNEE ARTHROSCOPY     right; "maybe twice"  . TOTAL KNEE ARTHROPLASTY   07/10/2012   Procedure: TOTAL KNEE ARTHROPLASTY;  Surgeon: Tobi Bastos, MD;  Location: [facility] ORS;  Service: Orthopedics;  Laterality: Right;  . TRANSTHORACIC ECHOCARDIOGRAM  12/10/2012   EF 55-60%. No regional wall motion abnormalities. LA moderately dialted.Mild-moderate regurg of the tricuspid valve.   HPI:  Pt is an 84 y.o. male who presents with complaints of cough, weakness, shortness of breath and fever for approximately 1 week. 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 Pt had an acute episode of choking on water and slurred speech with MRI positive for bilateral acute occipital lobe infarcts, R > L. BSE in February 2014 with a functional appearing oropharyngeal swallow; regular diet, thin liquids recommended. PMH: GERD, esophagel stricture, dilation, dysphagia to solids, stroke, seizures, HTN, HH, afib, chronic back pain   Assessment / Plan / Recommendation Clinical Impression  Pt has discoloration of his tongue, with almost a gray/black color on the posterior third that he says has been present for the last week or two. On the anterior R side he has a hard lump, also with mild discoloration but more pink/purple in color, which he believes has been present for one month and causes him no pain. Pt has anterior spillage, 5+ subswallows, wet vocal quality, and coughing with thin liquids, concerning for dysphagia with aspiration. A cough followed his initial bolus of puree, but with  no further signs of aspiration and the appearance of a more coordinated swallow. Question if this was still in response to thin liquid boluses. Recommend that pt remain NPO pending MBS for further assessment. RN made aware of findings of oral exam - MD may wish to further assess as well. SLP Visit Diagnosis: Dysphagia, unspecified (R13.10)    Aspiration Risk  Moderate aspiration risk    Diet Recommendation NPO   Medication Administration: Crushed with puree    Other   Recommendations Oral Care Recommendations: Oral care QID   Follow up Recommendations (tba)      Frequency and Duration            Prognosis Prognosis for Safe Diet Advancement: Good      Swallow Study   General HPI: Pt is an 84 y.o. male who presents with complaints of cough, weakness, shortness of breath and fever for approximately 1 week. 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 Pt had an acute episode of choking on water and slurred speech with MRI positive for bilateral acute occipital lobe infarcts, R > L. BSE in February 2014 with a functional appearing oropharyngeal swallow; regular diet, thin liquids recommended. PMH: GERD, esophagel stricture, dilation, dysphagia to solids, stroke, seizures, HTN, HH, afib, chronic back pain Type of Study: Bedside Swallow Evaluation Previous Swallow Assessment: see HPI Diet Prior to this Study: NPO Temperature Spikes Noted: No Respiratory Status: Nasal cannula History of Recent Intubation: No Behavior/Cognition: Alert;Cooperative;Pleasant mood Oral Cavity Assessment: Other (comment)(see clinical impressions) Oral Care Completed by SLP: No Vision: Functional for self-feeding Self-Feeding Abilities: Able to feed self;Needs assist Patient Positioning: Upright in bed Baseline Vocal Quality: Low vocal intensity Volitional Cough: Weak Volitional Swallow: Able to elicit    Oral/Motor/Sensory Function Overall Oral Motor/Sensory Function: Generalized oral weakness   Ice Chips Ice chips: Not tested   Thin Liquid Thin Liquid: Impaired Presentation: Cup;Self Fed Oral Phase Impairments: Reduced labial seal Oral Phase Functional Implications: Other (comment)(anterior spillage) Pharyngeal  Phase Impairments: Suspected delayed Swallow;Multiple swallows;Wet Vocal Quality;Cough - Immediate;Cough - Delayed    Nectar Thick Nectar Thick Liquid: Not tested   Honey Thick Honey Thick Liquid: Not tested   Puree  Puree: Impaired Presentation: Spoon Pharyngeal Phase Impairments: Cough - Immediate(? if delayed from thin liquids)   Solid   GO   Solid: Not tested        Germain Osgood 01/02/2018,11:30 AM  Germain Osgood, M.A. CCC-SLP [phone]

## 2018-01-02 NOTE — Progress Notes (Signed)
PROGRESS NOTE    Jay Walters  AYT:[id number] DOB: 11-05-1931 DOA: 12/30/2017 PCP: Elby Showers, MD    Brief Narrative: Jay Walters is a 84 y.o. male with medical history significant of pretension, stroke, chronic back pain regard to cervical disc and lumbar disc myelopathy atrial fibrillation and flutter now in presents the emergency department with complaints of cough, weakness, shortness of breath and fever for approximately 1 week.  Denies any abdominal pain, chest pain, nausea vomiting diarrhea, hemoptysis, dizziness, diaphoresis, trauma, peripheral edema or other complaints.  He states that his cough is fairly wet without significant sputum production.  He also reports some associated weight loss of wife thinks approximately 12 pounds in the past week to 10 days.  He has had very poor appetite and has been unable to eat very much at all.  His shortness of breath has worsened to the point where when he presented to the emergency department his sats were 88%.  He does not use any home oxygen.  He has no prior history of smoking.  He does not have any occupational exposures to fumes, fibers, or known lung toxins.  Still complains of some malodorous urine.  In the emergency department his white blood cell count was noted to be 22,000, his lactate is elevated at 2 his BNP is also elevated.  Found to have a history of atrial flutter and an echocardiogram in 3557 showed diastolic dysfunction with an ejection fraction of 55 to 60% with a severely dilated right atrium.  Possibly low at 77/52 with respirations of 29 on presentation when I saw the patient he looks very comfortable.  He has received 2 L of fluid boluses in the emergency department as well as ceftriaxone and azithromycin.  His chest x-ray showed significant abnormalities in the right upper lobe with a severe consolidation and possible pleural fluid.  A CT scan was obtained which is consistent with a right upper lobe bronchogenic tumor with  metastases to peritoneal and retroperitoneal lymph nodes, hilar lymph nodes, adrenal glands, the largest abdominal lymph node measuring 7.7 cm.  Conversation with his wife she reports that the patient has recently seen his neurosurgeon due to pain across his shoulders and back that was thought due to his cervical disease.  He received steroid injections in his shoulder with no relief.  [facility] were asked to admit.  Assessment & Plan:   Principal Problem:   Bronchogenic carcinoma of right lung ([place]) Active Problems:   Atrial flutter (H[facility])   Cervical disc disease with myelopathy   Long term current use of anticoagulant therapy   Chronic diastolic heart failure (H[facility])   Cerebral arteriosclerosis with history of previous stroke   Acute respiratory insufficiency   Metastatic lung cancer (metastasis from lung to other site) [facility])   Pneumonia of upper lobe due to infectious organism ([place])   Protein-calorie malnutrition, severe  1-Bilateral acute non hemorrhagic occipital lobe infarct Patient had shocking episode on water, per sister his speech is slurred on 7-22. He would slurred a few words when I first saw him 7-22 early morning.  Sister came later and reported slurred speech was new. I did neuro exam and  his motor strength was  5/5, he does slurred some words at time.  -CT head was negative for bleeding.  -overnight he develops worsening slurred speech, and worsening  lower extremities weakness.  -Neurology consulted.  -MRI positive for Bilateral acute nonhemorrhagic occipital lobe infarcts, right greater than left. There is cortical involvement on  the right measuring up to 17 mm. The left occipital infarct is contain to the white matter. -EEG. Background slowing, diffuse gray matter disturbance , nonspecific, dementia. No epileptiform activity.  -ABG negative for hypercapnia.  -coumadin resume, no need to bridge with heparin. Discussed with neurology   Right upper lobe lung  Mass;  Pulmonary following.  IR consulted for  Biopsy. Underwent Biopsy 7-23. Awaiting biopsy result.   -Post obstructive PNA; He was initially on ceftriaxone and azithromycin.  Started zosyn and vancomycin 7-23.   -Acute hypoxic respiratory failure secondary to number one.  Continue with oxygen supplementation.   -A flutter;  History of ablation in the past.  On coumadin. Holding coumadin in anticipation of biopsy.  INR supra-therapeutic at 10. Received a dose of vitamin K.  Plan to resume heparin after biopsy. Would like to have MRI results prior resuming heparin.   -Cervical disc disease with myelopathy:  Patient has well described cervical and lumbar back problems he is on chronic pain medications which will be continued.  -Chronic diastolic HF;  Appears compensated.   -one of two blood culture positive for staph, coagulase. Likely contaminant  Follow culture.  Worsening leukocytosis, will repeat blood culture, start IV vancomycin   Hypernatremia; change IV fluids to half NS  DVT prophylaxis: start heparin when INR less than 2.  Code Status: full code.  Family Communication:  Disposition Plan: Home when stable.   Consultants:   Pulmonology    Procedures:     Antimicrobials:  Ceftriaxone 7-21 Azithromycin 7-21  Subjective: He is alert, speech more clear.  Denies abdominal pain.    Objective: Vitals:   01/01/18 1738 01/01/18 2000 01/02/18 0007 01/02/18 0743  BP: (!) 99/56  (!) 102/48 115/65  Pulse: 67  64 (!) 59  Resp: (!) 34 (!) 27 20 20   Temp: 98.4 F (36.9 C)  98.1 F (36.7 C) 97.7 F (36.5 C)  TempSrc: Oral  Oral Oral  SpO2: 99% 94% 97% 96%  Weight:      Height:        Intake/Output Summary (Last 24 hours) at 01/02/2018 1157 Last data filed at 01/02/2018 0800 Gross per 24 hour  Intake 1022.58 ml  Output 925 ml  Net 97.58 ml   Filed Weights   12/30/17 0845 01/01/18 0500  Weight: 60.3 kg (133 lb) 65.4 kg (144 lb 2.9 oz)     Examination:  General exam: Chronically ill.  Respiratory system: Normal respiratory effort, crackles right Cardiovascular system: S 1, S 2 RRR Gastrointestinal system: BS present, distended, no rigidity  Central nervous system: alert, confused, speech less slurred, B/L weakness Extremities: No edema Skin: No rashes     Data Reviewed: I have personally reviewed following labs and imaging studies  CBC: Recent Labs  Lab 12/30/17 0659 12/31/17 0312 01/01/18 0803 01/02/18 0318  WBC 22.9* 24.6* 28.7* 29.1*  NEUTROABS 18.0*  --   --   --   HGB 13.2 12.4* 12.4* 12.4*  HCT 41.7 38.1* 39.3 39.7  MCV 96.5 94.1 97.5 98.3  PLT 287 232 227 350   Basic Metabolic Panel: Recent Labs  Lab 12/30/17 0659 12/31/17 0312 01/01/18 0803 01/02/18 0318  NA 143 142 144 148*  K 3.7 3.6 4.3 4.2  CL 110 112* 116* 119*  CO2 20* 22 19* 20*  GLUCOSE 117* 142* 105* 113*  BUN 28* 28* 27* 29*  CREATININE 1.23 0.97 0.90 1.09  CALCIUM 8.2* 8.3* 8.1* 8.3*   GFR: Estimated Creatinine Clearance: 46.7 mL/min (  by C-G formula based on SCr of 1.09 mg/dL). Liver Function Tests: Recent Labs  Lab 12/30/17 0659 01/02/18 0318  AST 16  --   ALT 15  --   ALKPHOS 67  --   BILITOT 0.9  --   PROT 5.2*  --   ALBUMIN 2.1* 1.9*   No results for input(s): LIPASE, AMYLASE in the last 168 hours. Recent Labs  Lab 01/01/18 1113  AMMONIA 21   Coagulation Profile: Recent Labs  Lab 12/30/17 1024 12/31/17 0312 01/01/18 0202 01/01/18 1637 01/02/18 0318  INR >10.00* 3.87 1.61 1.57 1.75   Cardiac Enzymes: Recent Labs  Lab 01/01/18 2132  CKTOTAL 107   BNP (last 3 results) No results for input(s): PROBNP in the last 8760 hours. HbA1C: No results for input(s): HGBA1C in the last 72 hours. CBG: Recent Labs  Lab 01/01/18 0742 01/02/18 0739  GLUCAP 115* 100*   Lipid Profile: No results for input(s): CHOL, HDL, LDLCALC, TRIG, CHOLHDL, LDLDIRECT in the last 72 hours. Thyroid Function  Tests: Recent Labs    01/01/18 1113  TSH 1.748   Anemia Panel: Recent Labs    01/01/18 1113  VITAMINB12 317   Sepsis Labs: Recent Labs  Lab 12/30/17 0754 12/30/17 1038  LATICACIDVEN 2.05* 1.66    Recent Results (from the past 240 hour(s))  Blood Culture (routine x 2)     Status: Abnormal   Collection Time: 12/30/17  7:55 AM  Result Value Ref Range Status   Specimen Description BLOOD RIGHT HAND  Final   Special Requests   Final    BOTTLES DRAWN AEROBIC AND ANAEROBIC Blood Culture adequate volume   Culture  Setup Time   Final    GRAM POSITIVE COCCI AEROBIC BOTTLE ONLY CRITICAL RESULT CALLED TO, READ BACK BY AND VERIFIED WITH: PHARMD M TURNER [id number] MLM    Culture (A)  Final    STAPHYLOCOCCUS SPECIES (COAGULASE NEGATIVE) THE SIGNIFICANCE OF ISOLATING THIS ORGANISM FROM A SINGLE SET OF BLOOD CULTURES WHEN MULTIPLE SETS ARE DRAWN IS UNCERTAIN. PLEASE NOTIFY THE MICROBIOLOGY DEPARTMENT WITHIN ONE WEEK IF SPECIATION AND SENSITIVITIES ARE REQUIRED. Performed at [facility], [place] [address] [place], [place] [address]    Report Status 01/02/2018 FINAL  Final  Blood Culture ID Panel (Reflexed)     Status: Abnormal   Collection Time: 12/30/17  7:55 AM  Result Value Ref Range Status   Enterococcus species NOT DETECTED NOT DETECTED Final   Listeria monocytogenes NOT DETECTED NOT DETECTED Final   Staphylococcus species DETECTED (A) NOT DETECTED Final    Comment: Methicillin (oxacillin) susceptible coagulase negative staphylococcus. Possible blood culture contaminant (unless isolated from more than one blood culture draw or clinical case suggests pathogenicity). No antibiotic treatment is indicated for blood  culture contaminants. CRITICAL RESULT CALLED TO, READ BACK BY AND VERIFIED WITH: PHARMD M TURNER [id number] MLM    Staphylococcus aureus NOT DETECTED NOT DETECTED Final   Methicillin resistance NOT DETECTED NOT DETECTED Final   Streptococcus species NOT DETECTED  NOT DETECTED Final   Streptococcus agalactiae NOT DETECTED NOT DETECTED Final   Streptococcus pneumoniae NOT DETECTED NOT DETECTED Final   Streptococcus pyogenes NOT DETECTED NOT DETECTED Final   Acinetobacter baumannii NOT DETECTED NOT DETECTED Final   Enterobacteriaceae species NOT DETECTED NOT DETECTED Final   Enterobacter cloacae complex NOT DETECTED NOT DETECTED Final   Escherichia coli NOT DETECTED NOT DETECTED Final   Klebsiella oxytoca NOT DETECTED NOT DETECTED Final   Klebsiella pneumoniae NOT DETECTED  NOT DETECTED Final   Proteus species NOT DETECTED NOT DETECTED Final   Serratia marcescens NOT DETECTED NOT DETECTED Final   Haemophilus influenzae NOT DETECTED NOT DETECTED Final   Neisseria meningitidis NOT DETECTED NOT DETECTED Final   Pseudomonas aeruginosa NOT DETECTED NOT DETECTED Final   Candida albicans NOT DETECTED NOT DETECTED Final   Candida glabrata NOT DETECTED NOT DETECTED Final   Candida krusei NOT DETECTED NOT DETECTED Final   Candida parapsilosis NOT DETECTED NOT DETECTED Final   Candida tropicalis NOT DETECTED NOT DETECTED Final    Comment: Performed at [facility], [place] [address], [place], [place] [address]  Blood Culture (routine x 2)     Status: None (Preliminary result)   Collection Time: 12/30/17  8:00 AM  Result Value Ref Range Status   Specimen Description BLOOD LEFT ANTECUBITAL  Final   Special Requests   Final    BOTTLES DRAWN AEROBIC AND ANAEROBIC Blood Culture adequate volume   Culture   Final    NO GROWTH 2 DAYS Performed at [facility], [place] [address] [place], [place] [address]    Report Status PENDING  Incomplete         Radiology Studies: Ct Head Wo Contrast  Result Date: 01/01/2018 CLINICAL DATA:  Speech difficulties.  History of stroke. EXAM: CT HEAD WITHOUT CONTRAST TECHNIQUE: Contiguous axial images were obtained from the base of the skull through the vertex without intravenous contrast. COMPARISON:  04/28/2014  FINDINGS: Brain: Diffuse cerebral atrophy. Ventricular dilatation consistent with central atrophy. Cavum septum pellucidum. Low-attenuation changes throughout the deep white matter consistent with small vessel ischemia. No mass-effect or midline shift. No abnormal extra-axial fluid collections. Gray-white matter junctions are distinct. Basal cisterns are not effaced. No acute intracranial hemorrhage. Vascular: Intracranial arterial vascular calcifications are present. Skull: Calvarium appears intact. Sinuses/Orbits: Paranasal sinuses and mastoid air cells are clear. Other: None. IMPRESSION: No acute intracranial abnormalities. Chronic atrophy and small vessel ischemic changes. Electronically Signed   By: Lucienne Capers M.D.   On: 01/01/2018 02:03   Mr Brain Wo Contrast  Result Date: 01/01/2018 CLINICAL DATA:  Slurred speech and dizziness. Encephalopathy. Altered level of consciousness. EXAM: MRI HEAD WITHOUT CONTRAST TECHNIQUE: Multiplanar, multiecho pulse sequences of the brain and surrounding structures were obtained without intravenous contrast. COMPARISON:  CT head without contrast 01/01/2018. MRI brain 07/29/2012. FINDINGS: Brain: A focal cortical infarct is present the margin of the right occipital and parietal lobe. This infarct measures up to 17 mm. A punctate left occipital lobe white matter infarct is present. T2 signal change is associated with both areas of acute infarct. There is no acute hemorrhage. Advanced atrophy and confluent white matter disease demonstrate some progression since 2014. Cavum septum pellucidum is incidentally noted. Dilated perivascular spaces are present. The internal auditory canals are within normal limits. The brainstem and cerebellum are normal. Remote infarcts are noted along the corpus callosum. Vascular: Flow is present in the major intracranial arteries. Skull and upper cervical spine: Skull base is within normal limits. Craniocervical junction is normal. Grade 1  anterolisthesis is present at C2-3. Chronic endplate changes are present at C3-4. The central canal is patent. Sinuses/Orbits: The paranasal sinuses and mastoid air cells are clear. Bilateral lens replacements are present. Globes and orbits are otherwise within normal limits. IMPRESSION: 1. Bilateral acute nonhemorrhagic occipital lobe infarcts, right greater than left. There is cortical involvement on the right measuring up to 17 mm. The left occipital infarct is contain to the white  matter. 2. Progression of advanced diffuse cerebral atrophy and white matter disease. Electronically Signed   By: San Morelle M.D.   On: 01/01/2018 09:29   Ir US Guide Bx Asp/drain  Result Date: 01/01/2018 INDICATION: 84 year old male with right upper lobe mass and omental nodularity concerning for metastatic lung cancer. He presents for ultrasound-guided biopsy of the largest left upper quadrant omental mass. EXAM: Ultrasound-guided biopsy, soft tissue MEDICATIONS: None. ANESTHESIA/SEDATION: A total is 0.5 mg Versed administered intravenously. FLUOROSCOPY TIME:  Fluoroscopy Time: 0 minutes 0 seconds (0 mGy). COMPLICATIONS: None immediate. PROCEDURE: Informed written consent was obtained from the patient after a thorough discussion of the procedural risks, benefits and alternatives. All questions were addressed. Maximal Sterile Barrier Technique was utilized including caps, mask, sterile gowns, sterile gloves, sterile drape, hand hygiene and skin antiseptic. A timeout was performed prior to the initiation of the procedure. The left upper quadrant was interrogated with ultrasound. A large heterogeneous soft tissue mass was successfully identified. A suitable skin entry site was selected and marked. Local anesthesia was attained by infiltration with 1% lidocaine. A small dermatotomy was made. Under real-time ultrasound guidance, an 18 gauge introducer needle was advanced into the margin of the mass. Several core biopsies  were then obtained coaxially using the bio Pince automated biopsy device. Biopsy specimens were placed in saline and delivered to pathology for further analysis. IMPRESSION: Ultrasound-guided core biopsy of left upper quadrant omental mass. Electronically Signed   By: Jacqulynn Cadet M.D.   On: 01/01/2018 16:18        Scheduled Meds: . atorvastatin  20 mg Oral q1800  . ferrous sulfate  325 mg Oral BID PC  . finasteride  5 mg Oral Daily  . multivitamin with minerals  1 tablet Oral Daily  . phenytoin  300 mg Oral QHS  . sodium chloride flush  3 mL Intravenous Q12H  . tamsulosin  0.4 mg Oral BID  . warfarin  3 mg Oral ONCE-1800  . Warfarin - Pharmacist Dosing Inpatient   Does not apply q1800   Continuous Infusions: . sodium chloride 75 mL/hr at 01/02/18 0742  . sodium chloride    . azithromycin Stopped (01/01/18 1830)  . piperacillin-tazobactam (ZOSYN)  IV 3.375 g (01/02/18 1031)  . vancomycin       LOS: 3 days    Time spent: 35 minutes.     Elmarie Shiley, MD [facility] Pager [phone]  If 7PM-7AM, please contact night-coverage [email or web address] Password [facility] 01/02/2018, 11:57 AM

## 2018-01-02 NOTE — Progress Notes (Signed)
ANTICOAGULATION CONSULT NOTE   Pharmacy Consult for warfarin Indication: atrial fibrillation and acute CVA  No Known Allergies  Patient Measurements: Height: 5\' 8"  (172.7 cm) Weight: 144 lb 2.9 oz (65.4 kg) IBW/kg (Calculated) : 68.4  Vital Signs: Temp: 97.7 F (36.5 C) (07/24 0743) Temp Source: Oral (07/24 0743) BP: 115/65 (07/24 0743) Pulse Rate: 59 (07/24 0743)  Labs: Recent Labs    12/31/17 0312 01/01/18 0202 01/01/18 0803 01/01/18 1637 01/01/18 2132 01/02/18 0318  HGB 12.4*  --  12.4*  --   --  12.4*  HCT 38.1*  --  39.3  --   --  39.7  PLT 232  --  227  --   --  194  LABPROT 37.7* 19.0*  --  18.7*  --  20.3*  INR 3.87 1.61  --  1.57  --  1.75  CREATININE 0.97  --  0.90  --   --  1.09  CKTOTAL  --   --   --   --  107  --     Estimated Creatinine Clearance: 46.7 mL/min (by C-G formula based on SCr of 1.09 mg/dL).     Assessment: 89 YOM with PMH of HTN, stroke, and afib on warfarin PTA who presented on 7/21 with cough and SOB. INR > 10 on admission and given vitamin K for reversal. Worsening AMS on 7/23 and MRI significant for bilateral acute nonhemorrhagic occipital lobe infarcts.   Underwent US guided core omental mass biopsy today with no estimated blood loss. Based on IR protocol post procedure based on standard bleeding risk, okay to resume warfarin therapy evening of the procedure.   INR this morning 1.75 Home regimen: 2.5 mg every Sun, Tue, Thu; 5 mg all other days  Goal of Therapy:  INR 2-3 Monitor platelets by anticoagulation protocol: Yes   Plan:  Repeat warfarin 3 mg tonight Monitor daily INR and CBC Monitor for signs/symptms of bleeding   Thank you Anette Guarneri, PharmD [phone] 01/02/2018, 8:12 AM

## 2018-01-02 NOTE — Progress Notes (Signed)
Pt could only tolerate cervical and thoracic MRI without contrast.  Turned in all imaging obtained.  If pt's condition gets better will continue with the lumbar if needed.

## 2018-01-02 NOTE — Progress Notes (Addendum)
STROKE TEAM PROGRESS NOTE  HPI:( Dr Lorraine Lax)  Jay Walters is a 84 y.o. male with history of atrial fibrillation, high hyperlipidemia, hypertension, seizures, stroke which has left him having gait difficulty and balance difficulty.  And talking to the sister apparently he has been bedridden for the past 2 weeks secondary to back pain and weakness in his legs.  He does walk with a walker however the last 2 weeks if not more he is progressively gotten weaker.  Per sister he apparently is left alone all day at home while his wife works and there is as she states "no telling how many times he is fallen".  I tried to get in contact with the wife however she is unavailable at this time.  Patient was brought to the emergency department where "his white blood cell count was noted to be 22,000, his lactate is elevated at 2 his BNP is also elevated. Found to have a history of atrial flutter and an echocardiogram in 1610 showed diastolic dysfunction with an ejection fraction of 55 to 60% with a severely dilated right atrium. Pressure was found to be as low as 77/52 with respirations of 29 on presentation . He wired 2 L of fluid boluses in the emergency department as well as ceftriaxone and azithromycin. His chest x-ray showed significant abnormalities in the right upper lobe with a severe consolidation and possible pleural fluid. 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  On admission INR level was greater than 10 and he is on chronic Coumadin."  Well in the hospital apparently had an episode of slurred speech.  Thus neurology was consulted.  MRI was obtained.  Showing bilateral occipital infarcts.  Allergy was consulted secondary to the MRI findings.   LKW: Unknown tpa given?: no, no last known normal Premorbid modified  Rankin scale (mRS): 4 NIHSS 5   INTERVAL HISTORY His RN and NT are at the bedside.  Patient is undergoing a bath.  Patient's subacute bilateral leg  weakness is not consistent with MRI findings.  Stroke may be incidental.  Need to check spine for possible etiologies.he states for last 2 weeks he is barely walk and has had increasing back pain and he also developed urinary retention this morning  Vitals:   01/01/18 1738 01/01/18 2000 01/02/18 0007 01/02/18 0743  BP: (!) 99/56  (!) 102/48 115/65  Pulse: 67  64 (!) 59  Resp: (!) 34 (!) 27 20 20   Temp: 98.4 F (36.9 C)  98.1 F (36.7 C) 97.7 F (36.5 C)  TempSrc: Oral  Oral Oral  SpO2: 99% 94% 97% 96%  Weight:      Height:        CBC:  Recent Labs  Lab 12/30/17 0659  01/01/18 0803 01/02/18 0318  WBC 22.9*   < > 28.7* 29.1*  NEUTROABS 18.0*  --   --   --   HGB 13.2   < > 12.4* 12.4*  HCT 41.7   < > 39.3 39.7  MCV 96.5   < > 97.5 98.3  PLT 287   < > 227 194   < > = values in this interval not displayed.    Basic Metabolic Panel:  Recent Labs  Lab 01/01/18 0803 01/02/18 0318  NA 144 148*  K 4.3 4.2  CL 116* 119*  CO2  19* 20*  GLUCOSE 105* 113*  BUN 27* 29*  CREATININE 0.90 1.09  CALCIUM 8.1* 8.3*   Lipid Panel:     Component Value Date/Time   CHOL 171 07/17/2017 1622   TRIG 79 07/17/2017 1622   HDL 50 07/17/2017 1622   CHOLHDL 3.4 07/17/2017 1622   CHOLHDL 3.1 12/01/2016 1105   VLDL 19 12/01/2016 1105   LDLCALC 105 (H) 07/17/2017 1622   HgbA1c:  Lab Results  Component Value Date   HGBA1C 5.5 12/01/2016   Urine Drug Screen: No results found for: LABOPIA, COCAINSCRNUR, LABBENZ, AMPHETMU, THCU, LABBARB  Alcohol Level No results found for: [facility]  IMAGING Ct Head Wo Contrast  Result Date: 01/01/2018 CLINICAL DATA:  Speech difficulties.  History of stroke. EXAM: CT HEAD WITHOUT CONTRAST TECHNIQUE: Contiguous axial images were obtained from the base of the skull through the vertex without intravenous contrast.  COMPARISON:  04/28/2014 FINDINGS: Brain: Diffuse cerebral atrophy. Ventricular dilatation consistent with central atrophy. Cavum septum pellucidum. Low-attenuation changes throughout the deep white matter consistent with small vessel ischemia. No mass-effect or midline shift. No abnormal extra-axial fluid collections. Gray-white matter junctions are distinct. Basal cisterns are not effaced. No acute intracranial hemorrhage. Vascular: Intracranial arterial vascular calcifications are present. Skull: Calvarium appears intact. Sinuses/Orbits: Paranasal sinuses and mastoid air cells are clear. Other: None. IMPRESSION: No acute intracranial abnormalities. Chronic atrophy and small vessel ischemic changes. Electronically Signed   By: Lucienne Capers M.D.   On: 01/01/2018 02:03   Mr Brain Wo Contrast  Result Date: 01/01/2018 CLINICAL DATA:  Slurred speech and dizziness. Encephalopathy. Altered level of consciousness. EXAM: MRI HEAD WITHOUT CONTRAST TECHNIQUE: Multiplanar, multiecho pulse sequences of the brain and surrounding structures were obtained without intravenous contrast. COMPARISON:  CT head without contrast 01/01/2018. MRI brain 07/29/2012. FINDINGS: Brain: A focal cortical infarct is present the margin of the right occipital and parietal lobe. This infarct measures up to 17 mm. A punctate left occipital lobe white matter infarct is present. T2 signal change is associated with both areas of acute infarct. There is no acute hemorrhage. Advanced atrophy and confluent white matter disease demonstrate some progression since 2014. Cavum septum pellucidum is incidentally noted. Dilated perivascular spaces are present. The internal auditory canals are within normal limits. The brainstem and cerebellum are normal. Remote infarcts are noted along the corpus callosum. Vascular: Flow is present in the major intracranial arteries. Skull and upper cervical spine: Skull base is within normal limits. Craniocervical  junction is normal. Grade 1 anterolisthesis is present at C2-3. Chronic endplate changes are present at C3-4. The central canal is patent. Sinuses/Orbits: The paranasal sinuses and mastoid air cells are clear. Bilateral lens replacements are present. Globes and orbits are otherwise within normal limits. IMPRESSION: 1. Bilateral acute nonhemorrhagic occipital lobe infarcts, right greater than left. There is cortical involvement on the right measuring up to 17 mm. The left occipital infarct is contain to the white matter. 2. Progression of advanced diffuse cerebral atrophy and white matter disease. Electronically Signed   By: San Morelle M.D.   On: 01/01/2018 09:29   Ir US Guide Bx Asp/drain  Result Date: 01/01/2018 INDICATION: 84 year old male with right upper lobe mass and omental nodularity concerning for metastatic lung cancer. He presents for ultrasound-guided biopsy of the largest left upper quadrant omental mass. EXAM: Ultrasound-guided biopsy, soft tissue MEDICATIONS: None. ANESTHESIA/SEDATION: A total is 0.5 mg Versed administered intravenously. FLUOROSCOPY TIME:  Fluoroscopy Time: 0 minutes 0 seconds (0 mGy). COMPLICATIONS: None immediate. PROCEDURE: Informed  written consent was obtained from the patient after a thorough discussion of the procedural risks, benefits and alternatives. All questions were addressed. Maximal Sterile Barrier Technique was utilized including caps, mask, sterile gowns, sterile gloves, sterile drape, hand hygiene and skin antiseptic. A timeout was performed prior to the initiation of the procedure. The left upper quadrant was interrogated with ultrasound. A large heterogeneous soft tissue mass was successfully identified. A suitable skin entry site was selected and marked. Local anesthesia was attained by infiltration with 1% lidocaine. A small dermatotomy was made. Under real-time ultrasound guidance, an 18 gauge introducer needle was advanced into the margin of the  mass. Several core biopsies were then obtained coaxially using the bio Pince automated biopsy device. Biopsy specimens were placed in saline and delivered to pathology for further analysis. IMPRESSION: Ultrasound-guided core biopsy of left upper quadrant omental mass. Electronically Signed   By: Jacqulynn Cadet M.D.   On: 01/01/2018 16:18    PHYSICAL EXAM Frail cachectic elderly African-American male not in distress. . Afebrile. Head is nontraumatic. Neck is supple without bruit.    Cardiac exam no murmur or gallop. Lungs are clear to auscultation. Distal pulses are well felt. Neurological Exam :  Awake alert oriented 3. No dysarthria or aphasia. Follows commands quite well. Extraocular movements are full range without nystagmus. Blinks to threat bilaterally. Fundi not visualized. Vision acuity seems adequate. Face is symmetric without weakness. Tongue is midline. Motor system exam reveals symmetric upper extremity strength without focal weakness. Lower extremity exam shows mild paraparesis bilaterally with 2/5 proximal hip flexor weakness with mild hip abductor and knee flexor weakness. Good strength distally. Deep tendon reflexes are brisk in both upper and lower extremities. Plantars are both equivocal. Sensation appears intact bilaterally. Gait deferred ASSESSMENT/PLAN Jay Walters is a 84 y.o. 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  Admitted for sepsis and encephalopathy.  During hospitalization, patient had an episode of slurred speech and neurology was consulted.  MRI shows bilateral occipital infarcts, which are an incidental finding.   Stroke:   Incidental bilateral occipital subcortical infarcts, embolic secondary  to known AF on warfarin vs d/t Small vessel disease. -etiology of strokes likely due to anticoagulation reversal with now suboptimal INR  INR was 10 on admission with warfarin reversed.  Possibly lead to hypercoagulable state.    CT head No acute stroke. Small vessel disease. Atrophy.   MRI bilateral right greater than left occipital lobe infarcts.  Right occipital and cortical area.  Left occipital and white matter.  Advanced cerebral atrophy.  White matter disease.  2D Echo  EF 65-70%. No source of embolus   EEG with background slowing, no seizure activity LDL pending   HgbA1c pending   Warfarin for VTE prophylaxis  warfarin daily prior to admission INR 10.0, reversed on arrival, now on warfarin daily. INR 1.75.  Recommend continue warfarin for secondary stroke prevention  Therapy recommendations:  pending   Disposition:  pending   BLE hip weakness  Brisk reflexes   Not related to stroke  Cervical and thoracic spine MRI w/o contrast to eval for mets, versus myelopathy  Atrial Fibrillation/Flutter  Home anticoagulation:  warfarin daily continued in the hospital  INR 10.0 on admission, reversed  Back on warfarin with pharmacy managing   INR 1.75    Hypertension  Stable . BP goal normotensive  Hyperlipidemia  Home meds: Tour 80 mg daily, resumed in hospital  LDL pending, goal < 70  Continue statin at discharge  Other Stroke Risk Factors  Advanced age  ETOH use, advised to drink no more than 2 drink(s) a day  Hx seizures w Migraines. EEG neg for seziures.  Hx stroke  ASD repair 3086  Chronic diastolic CHF  Other Active Problems  Right upper lobe  lung mass  Hypoxemia  Coagulopathy   Postobstructive PNA  Acute hypoxic respiratory failure  1 of 2 blood cultures positive for staph felt to be contaminant  Hospital day # [place], MSN, APRN, ANVP-BC, AGPCNP-BC Advanced Practice Stroke Nurse [place] for  Schedule & Pager information 01/02/2018 3:29 PM  I have personally examined this patient, reviewed notes, independently viewed imaging studies, participated in medical decision making and plan of care.ROS completed by me personally and pertinent positives fully documented  I have made any additions or clarifications directly to the above note. Agree with note above. The patient has complaints of subacute back pain and increasing leg weakness and difficulty walking which cannot be explained by the tiny bilateral occipital white matter infarcts which are likely embolic from his atrial fibrillation with anticoagulation been recently reversed and handed. Given diagnosis of lung cancer compressive myelopathy is a possibility and recommend MRI scan of the cervical, thoracic and lumbar spine. Long discussion with the patient and Dr. Tyrell Antonio and answered questions. Greater than 50% time during this 35 minute visit was spent in counseling and coordination of care about patient's strokes and leg weakness and gait difficulty and answering questions  Antony Contras, [place] Pager: [phone] 01/02/2018 3:38 PM  To contact Stroke Continuity provider, please refer to [email or web address] After hours, contact General Neurology

## 2018-01-03 ENCOUNTER — Other Ambulatory Visit: Payer: Self-pay | Admitting: *Deleted

## 2018-01-03 ENCOUNTER — Encounter: Payer: Self-pay | Admitting: *Deleted

## 2018-01-03 ENCOUNTER — Inpatient Hospital Stay (HOSPITAL_COMMUNITY): Payer: 59

## 2018-01-03 DIAGNOSIS — R599 Enlarged lymph nodes, unspecified: Secondary | ICD-10-CM

## 2018-01-03 DIAGNOSIS — R911 Solitary pulmonary nodule: Secondary | ICD-10-CM

## 2018-01-03 DIAGNOSIS — E279 Disorder of adrenal gland, unspecified: Secondary | ICD-10-CM

## 2018-01-03 DIAGNOSIS — R410 Disorientation, unspecified: Secondary | ICD-10-CM

## 2018-01-03 DIAGNOSIS — J90 Pleural effusion, not elsewhere classified: Secondary | ICD-10-CM

## 2018-01-03 DIAGNOSIS — C3490 Malignant neoplasm of unspecified part of unspecified bronchus or lung: Secondary | ICD-10-CM

## 2018-01-03 DIAGNOSIS — C3411 Malignant neoplasm of upper lobe, right bronchus or lung: Secondary | ICD-10-CM

## 2018-01-03 DIAGNOSIS — E46 Unspecified protein-calorie malnutrition: Secondary | ICD-10-CM

## 2018-01-03 DIAGNOSIS — K669 Disorder of peritoneum, unspecified: Secondary | ICD-10-CM

## 2018-01-03 DIAGNOSIS — I4892 Unspecified atrial flutter: Secondary | ICD-10-CM

## 2018-01-03 DIAGNOSIS — R531 Weakness: Secondary | ICD-10-CM

## 2018-01-03 DIAGNOSIS — R52 Pain, unspecified: Secondary | ICD-10-CM

## 2018-01-03 DIAGNOSIS — Z8673 Personal history of transient ischemic attack (TIA), and cerebral infarction without residual deficits: Secondary | ICD-10-CM

## 2018-01-03 DIAGNOSIS — C7951 Secondary malignant neoplasm of bone: Secondary | ICD-10-CM

## 2018-01-03 LAB — LIPID PANEL
Cholesterol: 93 mg/dL (ref 0–200)
HDL: 25 mg/dL — ABNORMAL LOW (ref >40)
LDL Cholesterol: 35 mg/dL (ref 0–99)
Total CHOL/HDL Ratio: 3.7 RATIO
Triglycerides: 165 mg/dL — ABNORMAL HIGH (ref <150)
VLDL: 33 mg/dL (ref 0–40)

## 2018-01-03 LAB — PROTIME-INR
INR: 2.42
Prothrombin Time: 26.1 seconds — ABNORMAL HIGH (ref 11.4–15.2)

## 2018-01-03 LAB — BASIC METABOLIC PANEL
Anion gap: 7 (ref 5–15)
BUN: 31 mg/dL — ABNORMAL HIGH (ref 8–23)
CO2: 20 mmol/L — ABNORMAL LOW (ref 22–32)
CREATININE: 1.34 mg/dL — AB (ref 0.61–1.24)
Calcium: 8.1 mg/dL — ABNORMAL LOW (ref 8.9–10.3)
Chloride: 119 mmol/L — ABNORMAL HIGH (ref 98–111)
GFR, EST AFRICAN AMERICAN: 54 mL/min — AB (ref >60)
GFR, EST NON AFRICAN AMERICAN: 47 mL/min — AB (ref >60)
Glucose, Bld: 116 mg/dL — ABNORMAL HIGH (ref 70–99)
POTASSIUM: 4 mmol/L (ref 3.5–5.1)
SODIUM: 146 mmol/L — AB (ref 135–145)

## 2018-01-03 LAB — GLUCOSE, CAPILLARY: GLUCOSE-CAPILLARY: 104 mg/dL — AB (ref 70–99)

## 2018-01-03 LAB — CBC
HCT: 37 % — ABNORMAL LOW (ref 39.0–52.0)
HEMOGLOBIN: 11.7 g/dL — AB (ref 13.0–17.0)
MCH: 30.5 pg (ref 26.0–34.0)
MCHC: 31.6 g/dL (ref 30.0–36.0)
MCV: 96.4 fL (ref 78.0–100.0)
Platelets: 151 10*3/uL (ref 150–400)
RBC: 3.84 MIL/uL — AB (ref 4.22–5.81)
RDW: 16.9 % — ABNORMAL HIGH (ref 11.5–15.5)
WBC: 30.7 10*3/uL — ABNORMAL HIGH (ref 4.0–10.5)

## 2018-01-03 LAB — MRSA PCR SCREENING: MRSA by PCR: NEGATIVE

## 2018-01-03 LAB — HEMOGLOBIN A1C
Hgb A1c MFr Bld: 5.8 % — ABNORMAL HIGH (ref 4.8–5.6)
MEAN PLASMA GLUCOSE: 119.76 mg/dL

## 2018-01-03 MED ORDER — GADOBENATE DIMEGLUMINE 529 MG/ML IV SOLN
15.0000 mL | Freq: Once | INTRAVENOUS | Status: AC
  Administered 2018-01-03: 14 mL via INTRAVENOUS

## 2018-01-03 MED ORDER — LORAZEPAM 2 MG/ML IJ SOLN
1.0000 mg | Freq: Once | INTRAMUSCULAR | Status: AC
  Administered 2018-01-03: 1 mg via INTRAVENOUS
  Filled 2018-01-03: qty 1

## 2018-01-03 MED ORDER — AZITHROMYCIN 250 MG PO TABS
500.0000 mg | ORAL_TABLET | Freq: Every day | ORAL | Status: DC
  Filled 2018-01-03: qty 2

## 2018-01-03 MED ORDER — SODIUM CHLORIDE 0.9 % IV SOLN
1.5000 g | Freq: Three times a day (TID) | INTRAVENOUS | Status: DC
  Administered 2018-01-03 – 2018-01-04 (×3): 1.5 g via INTRAVENOUS
  Filled 2018-01-03 (×3): qty 1.5

## 2018-01-03 NOTE — Progress Notes (Signed)
ANTICOAGULATION CONSULT NOTE   Pharmacy Consult for warfarin Indication: atrial fibrillation and acute CVA  No Known Allergies  Patient Measurements: Height: 5\' 8"  (172.7 cm) Weight: 145 lb 4.5 oz (65.9 kg) IBW/kg (Calculated) : 68.4  Vital Signs: Temp: 98.1 F (36.7 C) (07/25 0741) Temp Source: Oral (07/25 0741) BP: 102/53 (07/25 0741) Pulse Rate: 81 (07/25 0741)  Labs: Recent Labs    01/01/18 0803 01/01/18 1637 01/01/18 2132 01/02/18 0318 01/03/18 0247  HGB 12.4*  --   --  12.4*  --   HCT 39.3  --   --  39.7  --   PLT 227  --   --  194  --   LABPROT  --  18.7*  --  20.3* 26.1*  INR  --  1.57  --  1.75 2.42  CREATININE 0.90  --   --  1.09  --   CKTOTAL  --   --  107  --   --     Estimated Creatinine Clearance: 47 mL/min (by C-G formula based on SCr of 1.09 mg/dL).   Assessment: 66 YOM with PMH of HTN, stroke, and afib on warfarin PTA who presented on 7/21 with cough and SOB. INR > 10 on admission and given vitamin K for reversal. Worsening AMS on 7/23 and MRI significant for bilateral acute nonhemorrhagic occipital lobe infarcts.   Underwent US guided core omental mass biopsy today with no estimated blood loss. Based on IR protocol post procedure based on standard bleeding risk, okay to resume warfarin therapy evening of the procedure.   INR up to 2.42 this AM Home regimen: 2.5 mg every Sun, Tue, Thu; 5 mg all other days  Goal of Therapy:  INR 2-3 Monitor platelets by anticoagulation protocol: Yes   Plan:  No warfarin tonight Monitor daily INR and CBC Monitor for signs/symptms of bleeding   Thank you Anette Guarneri, PharmD [phone] 01/03/2018, 8:35 AM

## 2018-01-03 NOTE — Progress Notes (Signed)
Pharmacy Antibiotic Note Jay Walters is a 84 y.o. male admitted on 12/30/2017 with R upper lobe mass and concern for post obstructive PNA. Initially started on ceftriaxone and azithromycin, but changed to Vancomycin and Zosyn 7/23  BCID with contaminant Afebrile  Day # 3 of broad spectrum antibiotics  Plan: Zosyn 3.375g IV q8h (4 hour infusion).  Azithromycin 500 mg iv Q 24 Vancomycin 1 gram iv Q 24 hours -- consider stopping  Height: 5\' 8"  (172.7 cm) Weight: 145 lb 4.5 oz (65.9 kg) IBW/kg (Calculated) : 68.4  Temp (24hrs), Avg:98 F (36.7 C), Min:97.9 F (36.6 C), Max:98.1 F (36.7 C)  Recent Labs  Lab 12/30/17 0659 12/30/17 0754 12/30/17 1038 12/31/17 0312 01/01/18 0803 01/02/18 0318  WBC 22.9*  --   --  24.6* 28.7* 29.1*  CREATININE 1.23  --   --  0.97 0.90 1.09  LATICACIDVEN  --  2.05* 1.66  --   --   --      No Known Allergies   Thank you Anette Guarneri, PharmD [phone]  01/03/2018, 8:38 AM

## 2018-01-03 NOTE — Consult Note (Signed)
New Hematology/Oncology Consult   Requesting WG:NFAOZH Regalado       Reason for consult: Lung cancer  HPI: [name] resented to the emergency room 12/30/2017 with shortness of breath and a cough.  He reported generalized weakness, anorexia, and weight loss.  A chest x-ray revealed right upper lobe collapse.  A chest CT confirmed complete collapse of the right upper lobe with an obstructed right upper lobe bronchus.  A right upper lobe mass was noted.  Bilateral pulmonary nodules and a small right pleural effusion.  Numerous mesenteric, retroperitoneal, and peritoneal masses, bilateral adrenal masses, mediastinal adenopathy were noted.  He was admitted for further evaluation.  He underwent an ultrasound-guided biopsy of an omental mass 01/01/2018.  The pathology revealed poorly differentiated carcinoma, consistent with lung adenocarcinoma.  Neurology was consulted when he had an episode of slurred speech.  MRI revealed bilateral occipital infarcts.  He has a history of leg weakness progressing over several weeks.  MRIs of the spine with metastatic disease at the clivus, paraspinal cervical spine, and L2.  No evidence of cord compression.  His wife reports he has a history of left shoulder and lower back pain for several months.  He received a "injection" to the left shoulder by orthopedics.  She feels he has become more confused in the hospital.  She is not aware of any history of bowel or bladder dysfunction.     Past Medical History:  Diagnosis Date  . Anemia   . Arthritis    "right hip; right knee; lower back ~ 1/2 way across"  . Atrial fibrillation ([place])    remotely   . Atrial flutter ([place])    typical appearing by ekg. s/p ablation 07/28/11  . Diverticulosis   . Dysphagia    with solids  . Esophageal stricture   . GERD (gastroesophageal reflux disease)   . H/O hiatal hernia   . Heart murmur   . Hemorrhoids   . High cholesterol   . Hypertension   . Osteopenia   . Seizures  ([place])    "back w/migraine headaches; 1990's or before"  . Sinus bradycardia    asymptomatic  . SSS (sick sinus syndrome) ([place])   . Stroke [facility]) 2012   "left me weaker on left side; balance is not good"  :  Past Surgical History:  Procedure Laterality Date  . ASD REPAIR  1972   Patch repair  . ATRIAL FLUTTER ABLATION N/A 07/28/2011   Procedure: ATRIAL FLUTTER ABLATION;  Surgeon: Thompson Grayer, MD;  Location: [facility] CATH LAB;  Service: Cardiovascular;  Laterality: N/A;  . CARDIAC ELECTROPHYSIOLOGY STUDY AND ABLATION  07/28/11  . CARDIOVASCULAR STRESS TEST  03/07/2010   Perfusion defect in the inferior myocardial region is consistent with diaphragmatic attenuation. No ischemia or infarct/scar is seen in the remaining myocardium. No ECG changes.  . CAROTID DOPPLER  06/26/2011   Bilateral ICAs-demonstrated normal patency without eivdence of significant diameter reduction, dissection, or any other vascular abnormality.  . INGUINAL HERNIA REPAIR  2011   right  . IR US GUIDE BX ASP/DRAIN  01/01/2018  . JOINT REPLACEMENT     right knee replacement  . KNEE ARTHROSCOPY     right; "maybe twice"  . TOTAL KNEE ARTHROPLASTY  07/10/2012   Procedure: TOTAL KNEE ARTHROPLASTY;  Surgeon: Tobi Bastos, MD;  Location: [facility] ORS;  Service: Orthopedics;  Laterality: Right;  . TRANSTHORACIC ECHOCARDIOGRAM  12/10/2012   EF 55-60%. No regional wall motion abnormalities. LA moderately dialted.Mild-moderate regurg of the  tricuspid valve.  :   Current Facility-Administered Medications:  .  0.45 % sodium chloride infusion, , Intravenous, Continuous, Regalado, Belkys A, MD, Last Rate: 75 mL/hr at 01/03/18 1411 .  0.9 %  sodium chloride infusion, 250 mL, Intravenous, PRN, Lady Deutscher, MD .  acetaminophen (TYLENOL) tablet 650 mg, 650 mg, Oral, Q6H PRN, 650 mg at 12/31/17 1631 **OR** acetaminophen (TYLENOL) suppository 650 mg, 650 mg, Rectal, Q6H PRN, Lady Deutscher, MD .  atorvastatin (LIPITOR) tablet 20 mg, 20  mg, Oral, q1800, Lady Deutscher, MD, 20 mg at 01/02/18 1853 .  [START ON 01/04/2018] azithromycin (ZITHROMAX) tablet 500 mg, 500 mg, Oral, Daily, Regalado, Belkys A, MD .  ferrous sulfate tablet 325 mg, 325 mg, Oral, BID PC, Lady Deutscher, MD, 325 mg at 01/03/18 0849 .  finasteride (PROSCAR) tablet 5 mg, 5 mg, Oral, Daily, Lady Deutscher, MD, 5 mg at 01/03/18 0849 .  HYDROcodone-acetaminophen (NORCO) 10-325 MG per tablet 1 tablet, 1 tablet, Oral, Q4H PRN, Lady Deutscher, MD, 1 tablet at 12/30/17 2114 .  hydroxypropyl methylcellulose / hypromellose (ISOPTO TEARS / GONIOVISC) 2.5 % ophthalmic solution 1 drop, 1 drop, Both Eyes, Daily PRN, Lady Deutscher, MD .  multivitamin with minerals tablet 1 tablet, 1 tablet, Oral, Daily, Lady Deutscher, MD, 1 tablet at 01/03/18 [phone] .  ondansetron (ZOFRAN) tablet 4 mg, 4 mg, Oral, Q6H PRN **OR** ondansetron (ZOFRAN) injection 4 mg, 4 mg, Intravenous, Q6H PRN, Lady Deutscher, MD .  phenytoin (DILANTIN) chewable tablet 100 mg, 100 mg, Oral, TID, Kirby-Graham, Karsten Fells, NP, 100 mg at 01/03/18 0850 .  piperacillin-tazobactam (ZOSYN) IVPB 3.375 g, 3.375 g, Intravenous, Q8H, Regalado, Belkys A, MD, Last Rate: 12.5 mL/hr at 01/03/18 1150 .  RESOURCE THICKENUP CLEAR, , Oral, PRN, Regalado, Belkys A, MD .  sodium chloride flush (NS) 0.9 % injection 3 mL, 3 mL, Intravenous, Q12H, Lady Deutscher, MD, Stopped at 01/01/18 [phone] .  sodium chloride flush (NS) 0.9 % injection 3 mL, 3 mL, Intravenous, PRN, Lady Deutscher, MD .  traZODone (DESYREL) tablet 25 mg, 25 mg, Oral, QHS PRN, Lady Deutscher, MD, 25 mg at 12/30/17 2115 .  Warfarin - Pharmacist Dosing Inpatient, , Does not apply, q1800, Regalado, Belkys A, MD:  . atorvastatin  20 mg Oral q1800  . [START ON 01/04/2018] azithromycin  500 mg Oral Daily  . ferrous sulfate  325 mg Oral BID PC  . finasteride  5 mg Oral Daily  . multivitamin with minerals  1 tablet Oral Daily  . phenytoin   100 mg Oral TID  . sodium chloride flush  3 mL Intravenous Q12H  . Warfarin - Pharmacist Dosing Inpatient   Does not apply q1800  :  No Known Allergies:  FH:  SOCIAL HISTORY: He lives with his wife in [place].  He worked in a golf course like a room.  He does not smoke cigarettes.  He drinks alcohol.  Review of Systems: (Unable to obtain from patient) history is from his wife.  Positives include: Dysarthria, confusion, pain at the lower back and left shoulder, leg weakness  A complete ROS was otherwise negative.   Physical Exam:  Blood pressure (!) 102/53, pulse 81, temperature 98.1 F (36.7 C), temperature source Oral, resp. rate 17, height '5\' 8"'$  (1.727 m), weight 145 lb 4.5 oz (65.9 kg), SpO2 94 %.  HEENT: No thrush Lungs: Diminished breath sounds throughout the right chest, increased respiratory rate Cardiac: Regular rate and  rhythm Abdomen: No hepatomegaly, no mass, nontender GU: Testes without mass Vascular: No leg edema Lymph nodes: No cervical, supraclavicular, axillary, or inguinal nodes Neurologic: Alert, speech is dysarthric, the motor strength appears 4/5 bilaterally in the upper extremities, 3/5 strength with flexion at the hips, 4/5 strength with dorsiflexion and plantarflexion at the feet Skin: No rash  LABS:  Recent Labs    01/02/18 0318 01/03/18 1204  WBC 29.1* 30.7*  HGB 12.4* 11.7*  HCT 39.7 37.0*  PLT 194 151    Recent Labs    01/02/18 0318 01/03/18 1204  NA 148* 146*  K 4.2 4.0  CL 119* 119*  CO2 20* 20*  GLUCOSE 113* 116*  BUN 29* 31*  CREATININE 1.09 1.34*  CALCIUM 8.3* 8.1*      RADIOLOGY:  Dg Chest 2 View  Result Date: 12/30/2017 CLINICAL DATA:  Shortness of breath several weeks. EXAM: CHEST - 2 VIEW COMPARISON:  02/02/2015 FINDINGS: New confluent consolidation over the right upper lobe with suggestion of pleural fluid over the right apex. Mild tracheal deviation to the right. Left lung is clear. Cardiac silhouette is within  normal. Remainder the exam is unchanged. IMPRESSION: New right upper lobe collapse with possible loculated right apical pleural fluid. Recommend chest CT with contrast for further evaluation to exclude central obstructing lesion. Electronically Signed   By: Marin Olp M.D.   On: 12/30/2017 07:42   Ct Head Wo Contrast  Result Date: 01/01/2018 CLINICAL DATA:  Speech difficulties.  History of stroke. EXAM: CT HEAD WITHOUT CONTRAST TECHNIQUE: Contiguous axial images were obtained from the base of the skull through the vertex without intravenous contrast. COMPARISON:  04/28/2014 FINDINGS: Brain: Diffuse cerebral atrophy. Ventricular dilatation consistent with central atrophy. Cavum septum pellucidum. Low-attenuation changes throughout the deep white matter consistent with small vessel ischemia. No mass-effect or midline shift. No abnormal extra-axial fluid collections. Gray-white matter junctions are distinct. Basal cisterns are not effaced. No acute intracranial hemorrhage. Vascular: Intracranial arterial vascular calcifications are present. Skull: Calvarium appears intact. Sinuses/Orbits: Paranasal sinuses and mastoid air cells are clear. Other: None. IMPRESSION: No acute intracranial abnormalities. Chronic atrophy and small vessel ischemic changes. Electronically Signed   By: Lucienne Capers M.D.   On: 01/01/2018 02:03   Ct Chest W Contrast  Result Date: 12/30/2017 CLINICAL DATA:  Chest pain and shortness-of-breath. EXAM: CT CHEST WITH CONTRAST TECHNIQUE: Multidetector CT imaging of the chest was performed during intravenous contrast administration. CONTRAST:  77m OMNIPAQUE IOHEXOL 300 MG/ML  SOLN COMPARISON:  Chest x-ray today as well as chest CT 04/16/2012 FINDINGS: Cardiovascular: Heart is normal size. Calcified plaque over the left anterior descending coronary artery. Minimal calcified plaque over the thoracic aorta. Prominence of the main pulmonary arteries. No evidence of pulmonary embolism. Mild  compression of the SVC due to the right upper lobe lung mass as well as mediastinal adenopathy. Right upper lobe pulmonary artery is also mildly compressed by the adjacent adenopathy and right upper lobe mass. Mediastinum/Nodes: Extensive mediastinal adenopathy. Right superior mediastinal lymph node measures 2.6 cm by short axis immediately below the thyroid gland. Large right paratracheal/pretracheal lymph node measures 3.5 cm by short axis. Subcarinal lymph node measures 2.6 cm by short axis. Right hilar adenopathy appears to be confluent with the right upper lobe mass. Small lymph nodes along the right pericardiophrenic region and adjacent the distal esophagus. Lungs/Pleura: Examination demonstrates complete collapse of the right upper lobe with obstructed right upper lobe bronchus. Findings suggest a large right upper lobe lung mass with  borders difficult to define, but measuring approximately 6.1 x 8.2 cm with minimal mass effect on the adjacent SVC and right upper lobe pulmonary artery. This favors primary bronchogenic carcinoma. This mass abuts the suprahilar region. There is loculated fluid over the right upper lobe/apex. There are numerous bilateral pulmonary nodules compatible with metastatic disease. Small amount of pleural fluid in the right base. Upper Abdomen: Numerous mesenteric, retroperitoneal and peritoneal masses compatible metastatic disease with the largest over the left upper quadrant measuring 6.4 x 7.7 cm. Bilateral adrenal masses likely metastatic disease. Somewhat nodular wall thickening of the gallbladder as cannot exclude metastatic disease. Minimal calcified plaque over the abdominal aorta. Musculoskeletal: Curvature of the thoracic spine convex right. Degenerative change of the spine. IMPRESSION: Large right upper lobe mass extending towards the suprahilar region measuring approximately 6.1 x 8.2 cm concerning for primary bronchogenic carcinoma. This causes obstruction of the right  upper lobe bronchus with mild mass effect on the SVC and right upper lobe pulmonary artery. Small associated right pleural effusion. Extensive associated mediastinal and right hilar adenopathy. Numerous bilateral pulmonary nodules compatible with metastatic disease. Numerous mesenteric, retroperitoneal and peritoneal nodules/masses compatible with metastatic disease with the largest over the left upper quadrant measuring 7.7 cm. Bilateral adrenal masses likely metastatic disease. Nodular wall thickening of the gallbladder suggesting metastatic disease. Minimal atherosclerotic coronary artery disease. Aortic Atherosclerosis (ICD10-I70.0). Electronically Signed   By: Marin Olp M.D.   On: 12/30/2017 10:14   Mr Brain Wo Contrast  Result Date: 01/01/2018 CLINICAL DATA:  Slurred speech and dizziness. Encephalopathy. Altered level of consciousness. EXAM: MRI HEAD WITHOUT CONTRAST TECHNIQUE: Multiplanar, multiecho pulse sequences of the brain and surrounding structures were obtained without intravenous contrast. COMPARISON:  CT head without contrast 01/01/2018. MRI brain 07/29/2012. FINDINGS: Brain: A focal cortical infarct is present the margin of the right occipital and parietal lobe. This infarct measures up to 17 mm. A punctate left occipital lobe white matter infarct is present. T2 signal change is associated with both areas of acute infarct. There is no acute hemorrhage. Advanced atrophy and confluent white matter disease demonstrate some progression since 2014. Cavum septum pellucidum is incidentally noted. Dilated perivascular spaces are present. The internal auditory canals are within normal limits. The brainstem and cerebellum are normal. Remote infarcts are noted along the corpus callosum. Vascular: Flow is present in the major intracranial arteries. Skull and upper cervical spine: Skull base is within normal limits. Craniocervical junction is normal. Grade 1 anterolisthesis is present at C2-3. Chronic  endplate changes are present at C3-4. The central canal is patent. Sinuses/Orbits: The paranasal sinuses and mastoid air cells are clear. Bilateral lens replacements are present. Globes and orbits are otherwise within normal limits. IMPRESSION: 1. Bilateral acute nonhemorrhagic occipital lobe infarcts, right greater than left. There is cortical involvement on the right measuring up to 17 mm. The left occipital infarct is contain to the white matter. 2. Progression of advanced diffuse cerebral atrophy and white matter disease. Electronically Signed   By: San Morelle M.D.   On: 01/01/2018 09:29   Mr Cervical Spine Wo Contrast  Result Date: 01/02/2018 CLINICAL DATA:  Back pain.  Myelopathy.  Metastatic lung cancer. EXAM: MRI CERVICAL, THORACIC SPINE WITHOUT CONTRAST TECHNIQUE: Multiplanar and multiecho pulse sequences of the cervical spine, to include the craniocervical junction and cervicothoracic junction, and thoracic spine, were obtained without intravenous contrast. COMPARISON:  CT chest December 30, 2017, MRI cervical spine May 08, 2011. FINDINGS: MRI CERVICAL SPINE FINDINGS ALIGNMENT: Broad reversed cervical  lordosis.  No malalignment. VERTEBRAE/DISCS: Abnormal infiltrative low T1, bright STIR signal LEFT C5, C6 articular pillars and lateral vertebral bodies, and adjacent soft tissues. Intact vertebral bodies intact. Severe progressed disc height loss C3-4 through C6-7 with severe subacute discogenic endplate changes. Heterogeneously bright STIR signal C4-5 disc favoring mineralization. Moderate to severe C3-4 degenerative disc. 8 mm low signal lesion in clivus, potential metastasis. CORD:Multifocal spinal cord myelomalacia at sites of prior cord edema. No syrinx. POSTERIOR FOSSA, VERTEBRAL ARTERIES, PARASPINAL TISSUES: No MR findings of ligamentous injury. Vertebral artery flow voids present. Multiple paraspinous cystic masses at C7 and T1 measuring to 17 mm these may arrive from the spinous  process. DISC LEVELS: C2-3: Uncovertebral hypertrophy. Severe RIGHT and moderate LEFT facet arthropathy. No canal stenosis. Moderate RIGHT neural foraminal narrowing. C3-4: Uncovertebral hypertrophy mild facet arthropathy. No canal stenosis. Mild neural foraminal narrowing. C4-5: Uncovertebral hypertrophy. Mild facet arthropathy. No canal stenosis. Moderate LEFT neural foraminal narrowing with soft tissue component. C5-6: Small broad-based disc osteophyte complex. Abnormal soft tissue effacing LEFT neural foramen. No canal stenosis. Moderate RIGHT neural foraminal narrowing. C6-7: Small broad-based disc osteophyte complex. No canal stenosis. Moderate to severe bilateral neural foraminal narrowing. C7-T1: Uncovertebral hypertrophy. No canal stenosis. Mild LEFT neural foraminal narrowing. MRI THORACIC SPINE FINDINGS ALIGNMENT: Maintenance of the thoracic kyphosis. No malalignment. VERTEBRAE/DISCS: Vertebral bodies are intact. Intervertebral discs morphology maintained with mild desiccation. Abnormal signal bilateral T12 ribs compatible with metastasis. Subcentimeter bright STIR signal RIGHT T5 facet. CORD: Thoracic spinal cord is normal morphology and signal characteristics to the level of the conus medullaris which terminates at. PREVERTEBRAL AND PARASPINAL SOFT TISSUES: Abnormal masses within the mediastinum and RIGHT lung consistent with tumor with small RIGHT pleural effusion. DISC LEVELS: No disc bulge, canal stenosis nor neural foraminal narrowing. IMPRESSION: MRI cervical spine: 1. LEFT C5 and C6 interosseous and paraspinal mass consistent with metastatic disease. Paraspinous nodules C7 and T1 concerning for metastatic disease. Potential clival metastasis. No pathologic fracture. Contrast enhanced sequences would better characterize tumor. 2. Multi cervical spinal cord myelomalacia.  No syrinx. 3. Progressed degenerative change of the cervical spine. No canal stenosis. 4. Neural foraminal narrowing C2-3 through  C6-7: Moderate to severe at C6-7. Tumor effacing LEFT C5-6 and to lesser extent LEFT C4-5 neural foramen. MRI thoracic spine: 1. Bilateral T12 rib osseous metastasis. Extensive mediastinal and RIGHT lung metastasis. RIGHT T5 facet metastasis without pathologic fracture. Contrast enhanced sequences may detected additional metastasis. 2. No canal stenosis or neural foraminal narrowing. Electronically Signed   By: Elon Alas M.D.   On: 01/02/2018 19:06   Mr Cervical Spine W Contrast  Result Date: 01/03/2018 CLINICAL DATA:  84 year old male with large right lung mass discovered four days ago. Slurred speech and worsening lower extremity weakness. Lower extremity symptoms not explained by small posterior circulation infarcts seen on MRI 01/01/2018. Spinal and paraspinal metastases in the cervical and thoracic spine demonstrated yesterday without IV contrast. EXAM: MRI CERVICAL SPINE WITH CONTRAST MRI THORACIC SPINE WITH CONTRAST MRI LUMBAR SPINE WITHOUT AND WITH CONTRAST TECHNIQUE: Multisequence MR imaging of the spine from the cervical spine to the sacrum was performed prior to and following IV contrast administration for evaluation of spinal metastatic disease. CONTRAST:  47m MULTIHANCE GADOBENATE DIMEGLUMINE 529 MG/ML IV SOLN COMPARISON:  Noncontrast cervical and thoracic MRI 01/02/2018. Noncontrast brain MRI 01/01/2018. Chest CT 12/30/2017. FINDINGS: MRI CERVICAL SPINE FINDINGS Alignment: Stable since yesterday with mild reversal of cervical lordosis. Vertebrae: Left vertebral enhancing metastasis with epidural and paraspinal extraosseous extension re-demonstrated  at the C5-C6 level on the left. As seen yesterday, the tumor is approximately 3 centimeters in diameter centered at the left C5 pedicle and C6 neural foramen. There is epidural extension of tumor into the left lateral spinal canal at C5 (series 19, image 18). This abuts the lateral left spinal cord without mass effect on the cord. The left C5  and C6 neural foramina are completely invaded by tumor. No other cervical vertebral tumor identified. Cord: No abnormal spinal cord enhancement. Abnormal left lateral epidural space at C5, but no dural thickening. Posterior Fossa, vertebral arteries, paraspinal tissues: There is a 17 millimeter round posterior right paraspinal soft tissue metastasis abutting the C7 spinous process (series 18, image 7 and series 19, image 31. The small clivus lesion is enhancing and highly suspicious for small metastasis. No abnormal enhancement identified in the posterior fossa. Disc levels: Stable since yesterday, no cervical spinal stenosis. MRI THORACIC SPINE FINDINGS Segmentation: Absent ribs at T12. This is the same numbering system used yesterday. Alignment:  Stable.  Moderate dextroconvex scoliosis. Vertebrae: The ribs at T12 are absent, not involved by tumor or metastasis. No thoracic vertebral tumor is identified. The right lung mass closely abuts the right thoracic spine but there is no foraminal or vertebral invasion identified. Cord: On the sagittal post-contrast images today there is speckled artifact projecting over the T8 and T9 spinal levels. No abnormal spinal cord enhancement. No thoracic dural thickening or enhancement. Paraspinal and other soft tissues: Stable since yesterday. Disc levels: Stable since yesterday, no thoracic spinal stenosis. MRI LUMBAR SPINE FINDINGS Segmentation:  Normal, confirming absent ribs at T12. Alignment: Preserved lumbar lordosis. Superimposed dextroconvex upper lumbar curvature. Vertebrae: There is a 2 centimeter round enhancing metastasis in the right superior L2 vertebral body (series 5, image 6). No extraosseous extension. No other lumbar metastasis identified. No central sacral metastasis. Possible small metastasis partially visible in the medial right iliac bone on series 8, image 34. Conus medullaris: Extends to the T11-T12 level and was best demonstrated on the thoracic MRI  yesterday. The cauda equina nerve roots are non thickened and there is no abnormal lumbar intradural enhancement. No dural thickening. Paraspinal and other soft tissues: There is abnormal prevertebral and mesenteric soft tissue nodules with enhancement in the visible abdomen suggesting widespread metastatic lymphadenopathy or mesenteric tumor. Some of the largest lesions are 15-17 millimeter short axis (series 7, image 25 and 27). Superimposed small volume of pelvic free fluid (series 6, image 1). Negative visualized posterior paraspinal soft tissues. Disc levels: Lumbar spine degeneration, worst at L4-L5 and eccentric to the right. No associated spinal stenosis. There is severe right L4 foraminal stenosis. IMPRESSION: 1. No spinal cord or cauda equina metastasis. No spinal cord or cauda equina stenosis/compression. But there is severe degenerative right L4 neural foraminal stenosis. 2. No thoracic spine metastasis (absent ribs at T12). Isolated small lumbar metastasis in the L2 vertebral body. 3. Re-demonstrated left C5 vertebral and paraspinal metastasis with infiltration of the left C5 and C6 neural foramina. There is left lateral epidural tumor at the C5 spinal cord level, but no cord compression or enhancement. 4. Extensive soft tissue nodularity in the visible lower abdominal mesentery and lumbar prevertebral space compatible with disseminated soft tissue or lymph node metastases. Associated small volume lower abdominal and pelvic free fluid. 5. Small posterior paraspinal soft tissue metastasis abutting the C7 spinous process. Small clivus metastasis suspected. Electronically Signed   By: Genevie Ann M.D.   On: 01/03/2018 12:12   Mr  Thoracic Spine Wo Contrast  Result Date: 01/02/2018 CLINICAL DATA:  Back pain.  Myelopathy.  Metastatic lung cancer. EXAM: MRI CERVICAL, THORACIC SPINE WITHOUT CONTRAST TECHNIQUE: Multiplanar and multiecho pulse sequences of the cervical spine, to include the craniocervical  junction and cervicothoracic junction, and thoracic spine, were obtained without intravenous contrast. COMPARISON:  CT chest December 30, 2017, MRI cervical spine May 08, 2011. FINDINGS: MRI CERVICAL SPINE FINDINGS ALIGNMENT: Broad reversed cervical lordosis.  No malalignment. VERTEBRAE/DISCS: Abnormal infiltrative low T1, bright STIR signal LEFT C5, C6 articular pillars and lateral vertebral bodies, and adjacent soft tissues. Intact vertebral bodies intact. Severe progressed disc height loss C3-4 through C6-7 with severe subacute discogenic endplate changes. Heterogeneously bright STIR signal C4-5 disc favoring mineralization. Moderate to severe C3-4 degenerative disc. 8 mm low signal lesion in clivus, potential metastasis. CORD:Multifocal spinal cord myelomalacia at sites of prior cord edema. No syrinx. POSTERIOR FOSSA, VERTEBRAL ARTERIES, PARASPINAL TISSUES: No MR findings of ligamentous injury. Vertebral artery flow voids present. Multiple paraspinous cystic masses at C7 and T1 measuring to 17 mm these may arrive from the spinous process. DISC LEVELS: C2-3: Uncovertebral hypertrophy. Severe RIGHT and moderate LEFT facet arthropathy. No canal stenosis. Moderate RIGHT neural foraminal narrowing. C3-4: Uncovertebral hypertrophy mild facet arthropathy. No canal stenosis. Mild neural foraminal narrowing. C4-5: Uncovertebral hypertrophy. Mild facet arthropathy. No canal stenosis. Moderate LEFT neural foraminal narrowing with soft tissue component. C5-6: Small broad-based disc osteophyte complex. Abnormal soft tissue effacing LEFT neural foramen. No canal stenosis. Moderate RIGHT neural foraminal narrowing. C6-7: Small broad-based disc osteophyte complex. No canal stenosis. Moderate to severe bilateral neural foraminal narrowing. C7-T1: Uncovertebral hypertrophy. No canal stenosis. Mild LEFT neural foraminal narrowing. MRI THORACIC SPINE FINDINGS ALIGNMENT: Maintenance of the thoracic kyphosis. No malalignment.  VERTEBRAE/DISCS: Vertebral bodies are intact. Intervertebral discs morphology maintained with mild desiccation. Abnormal signal bilateral T12 ribs compatible with metastasis. Subcentimeter bright STIR signal RIGHT T5 facet. CORD: Thoracic spinal cord is normal morphology and signal characteristics to the level of the conus medullaris which terminates at. PREVERTEBRAL AND PARASPINAL SOFT TISSUES: Abnormal masses within the mediastinum and RIGHT lung consistent with tumor with small RIGHT pleural effusion. DISC LEVELS: No disc bulge, canal stenosis nor neural foraminal narrowing. IMPRESSION: MRI cervical spine: 1. LEFT C5 and C6 interosseous and paraspinal mass consistent with metastatic disease. Paraspinous nodules C7 and T1 concerning for metastatic disease. Potential clival metastasis. No pathologic fracture. Contrast enhanced sequences would better characterize tumor. 2. Multi cervical spinal cord myelomalacia.  No syrinx. 3. Progressed degenerative change of the cervical spine. No canal stenosis. 4. Neural foraminal narrowing C2-3 through C6-7: Moderate to severe at C6-7. Tumor effacing LEFT C5-6 and to lesser extent LEFT C4-5 neural foramen. MRI thoracic spine: 1. Bilateral T12 rib osseous metastasis. Extensive mediastinal and RIGHT lung metastasis. RIGHT T5 facet metastasis without pathologic fracture. Contrast enhanced sequences may detected additional metastasis. 2. No canal stenosis or neural foraminal narrowing. Electronically Signed   By: Elon Alas M.D.   On: 01/02/2018 19:06   Mr Thoracic Spine W Contrast  Result Date: 01/03/2018 CLINICAL DATA:  84 year old male with large right lung mass discovered four days ago. Slurred speech and worsening lower extremity weakness. Lower extremity symptoms not explained by small posterior circulation infarcts seen on MRI 01/01/2018. Spinal and paraspinal metastases in the cervical and thoracic spine demonstrated yesterday without IV contrast. EXAM: MRI  CERVICAL SPINE WITH CONTRAST MRI THORACIC SPINE WITH CONTRAST MRI LUMBAR SPINE WITHOUT AND WITH CONTRAST TECHNIQUE: Multisequence MR imaging of the  spine from the cervical spine to the sacrum was performed prior to and following IV contrast administration for evaluation of spinal metastatic disease. CONTRAST:  3m MULTIHANCE GADOBENATE DIMEGLUMINE 529 MG/ML IV SOLN COMPARISON:  Noncontrast cervical and thoracic MRI 01/02/2018. Noncontrast brain MRI 01/01/2018. Chest CT 12/30/2017. FINDINGS: MRI CERVICAL SPINE FINDINGS Alignment: Stable since yesterday with mild reversal of cervical lordosis. Vertebrae: Left vertebral enhancing metastasis with epidural and paraspinal extraosseous extension re-demonstrated at the C5-C6 level on the left. As seen yesterday, the tumor is approximately 3 centimeters in diameter centered at the left C5 pedicle and C6 neural foramen. There is epidural extension of tumor into the left lateral spinal canal at C5 (series 19, image 18). This abuts the lateral left spinal cord without mass effect on the cord. The left C5 and C6 neural foramina are completely invaded by tumor. No other cervical vertebral tumor identified. Cord: No abnormal spinal cord enhancement. Abnormal left lateral epidural space at C5, but no dural thickening. Posterior Fossa, vertebral arteries, paraspinal tissues: There is a 17 millimeter round posterior right paraspinal soft tissue metastasis abutting the C7 spinous process (series 18, image 7 and series 19, image 31. The small clivus lesion is enhancing and highly suspicious for small metastasis. No abnormal enhancement identified in the posterior fossa. Disc levels: Stable since yesterday, no cervical spinal stenosis. MRI THORACIC SPINE FINDINGS Segmentation: Absent ribs at T12. This is the same numbering system used yesterday. Alignment:  Stable.  Moderate dextroconvex scoliosis. Vertebrae: The ribs at T12 are absent, not involved by tumor or metastasis. No thoracic  vertebral tumor is identified. The right lung mass closely abuts the right thoracic spine but there is no foraminal or vertebral invasion identified. Cord: On the sagittal post-contrast images today there is speckled artifact projecting over the T8 and T9 spinal levels. No abnormal spinal cord enhancement. No thoracic dural thickening or enhancement. Paraspinal and other soft tissues: Stable since yesterday. Disc levels: Stable since yesterday, no thoracic spinal stenosis. MRI LUMBAR SPINE FINDINGS Segmentation:  Normal, confirming absent ribs at T12. Alignment: Preserved lumbar lordosis. Superimposed dextroconvex upper lumbar curvature. Vertebrae: There is a 2 centimeter round enhancing metastasis in the right superior L2 vertebral body (series 5, image 6). No extraosseous extension. No other lumbar metastasis identified. No central sacral metastasis. Possible small metastasis partially visible in the medial right iliac bone on series 8, image 34. Conus medullaris: Extends to the T11-T12 level and was best demonstrated on the thoracic MRI yesterday. The cauda equina nerve roots are non thickened and there is no abnormal lumbar intradural enhancement. No dural thickening. Paraspinal and other soft tissues: There is abnormal prevertebral and mesenteric soft tissue nodules with enhancement in the visible abdomen suggesting widespread metastatic lymphadenopathy or mesenteric tumor. Some of the largest lesions are 15-17 millimeter short axis (series 7, image 25 and 27). Superimposed small volume of pelvic free fluid (series 6, image 1). Negative visualized posterior paraspinal soft tissues. Disc levels: Lumbar spine degeneration, worst at L4-L5 and eccentric to the right. No associated spinal stenosis. There is severe right L4 foraminal stenosis. IMPRESSION: 1. No spinal cord or cauda equina metastasis. No spinal cord or cauda equina stenosis/compression. But there is severe degenerative right L4 neural foraminal  stenosis. 2. No thoracic spine metastasis (absent ribs at T12). Isolated small lumbar metastasis in the L2 vertebral body. 3. Re-demonstrated left C5 vertebral and paraspinal metastasis with infiltration of the left C5 and C6 neural foramina. There is left lateral epidural tumor at the C5  spinal cord level, but no cord compression or enhancement. 4. Extensive soft tissue nodularity in the visible lower abdominal mesentery and lumbar prevertebral space compatible with disseminated soft tissue or lymph node metastases. Associated small volume lower abdominal and pelvic free fluid. 5. Small posterior paraspinal soft tissue metastasis abutting the C7 spinous process. Small clivus metastasis suspected. Electronically Signed   By: Genevie Ann M.D.   On: 01/03/2018 12:12   Mr Lumbar Spine W Wo Contrast  Result Date: 01/03/2018 CLINICAL DATA:  84 year old male with large right lung mass discovered four days ago. Slurred speech and worsening lower extremity weakness. Lower extremity symptoms not explained by small posterior circulation infarcts seen on MRI 01/01/2018. Spinal and paraspinal metastases in the cervical and thoracic spine demonstrated yesterday without IV contrast. EXAM: MRI CERVICAL SPINE WITH CONTRAST MRI THORACIC SPINE WITH CONTRAST MRI LUMBAR SPINE WITHOUT AND WITH CONTRAST TECHNIQUE: Multisequence MR imaging of the spine from the cervical spine to the sacrum was performed prior to and following IV contrast administration for evaluation of spinal metastatic disease. CONTRAST:  34m MULTIHANCE GADOBENATE DIMEGLUMINE 529 MG/ML IV SOLN COMPARISON:  Noncontrast cervical and thoracic MRI 01/02/2018. Noncontrast brain MRI 01/01/2018. Chest CT 12/30/2017. FINDINGS: MRI CERVICAL SPINE FINDINGS Alignment: Stable since yesterday with mild reversal of cervical lordosis. Vertebrae: Left vertebral enhancing metastasis with epidural and paraspinal extraosseous extension re-demonstrated at the C5-C6 level on the left. As  seen yesterday, the tumor is approximately 3 centimeters in diameter centered at the left C5 pedicle and C6 neural foramen. There is epidural extension of tumor into the left lateral spinal canal at C5 (series 19, image 18). This abuts the lateral left spinal cord without mass effect on the cord. The left C5 and C6 neural foramina are completely invaded by tumor. No other cervical vertebral tumor identified. Cord: No abnormal spinal cord enhancement. Abnormal left lateral epidural space at C5, but no dural thickening. Posterior Fossa, vertebral arteries, paraspinal tissues: There is a 17 millimeter round posterior right paraspinal soft tissue metastasis abutting the C7 spinous process (series 18, image 7 and series 19, image 31. The small clivus lesion is enhancing and highly suspicious for small metastasis. No abnormal enhancement identified in the posterior fossa. Disc levels: Stable since yesterday, no cervical spinal stenosis. MRI THORACIC SPINE FINDINGS Segmentation: Absent ribs at T12. This is the same numbering system used yesterday. Alignment:  Stable.  Moderate dextroconvex scoliosis. Vertebrae: The ribs at T12 are absent, not involved by tumor or metastasis. No thoracic vertebral tumor is identified. The right lung mass closely abuts the right thoracic spine but there is no foraminal or vertebral invasion identified. Cord: On the sagittal post-contrast images today there is speckled artifact projecting over the T8 and T9 spinal levels. No abnormal spinal cord enhancement. No thoracic dural thickening or enhancement. Paraspinal and other soft tissues: Stable since yesterday. Disc levels: Stable since yesterday, no thoracic spinal stenosis. MRI LUMBAR SPINE FINDINGS Segmentation:  Normal, confirming absent ribs at T12. Alignment: Preserved lumbar lordosis. Superimposed dextroconvex upper lumbar curvature. Vertebrae: There is a 2 centimeter round enhancing metastasis in the right superior L2 vertebral body  (series 5, image 6). No extraosseous extension. No other lumbar metastasis identified. No central sacral metastasis. Possible small metastasis partially visible in the medial right iliac bone on series 8, image 34. Conus medullaris: Extends to the T11-T12 level and was best demonstrated on the thoracic MRI yesterday. The cauda equina nerve roots are non thickened and there is no abnormal lumbar intradural enhancement.  No dural thickening. Paraspinal and other soft tissues: There is abnormal prevertebral and mesenteric soft tissue nodules with enhancement in the visible abdomen suggesting widespread metastatic lymphadenopathy or mesenteric tumor. Some of the largest lesions are 15-17 millimeter short axis (series 7, image 25 and 27). Superimposed small volume of pelvic free fluid (series 6, image 1). Negative visualized posterior paraspinal soft tissues. Disc levels: Lumbar spine degeneration, worst at L4-L5 and eccentric to the right. No associated spinal stenosis. There is severe right L4 foraminal stenosis. IMPRESSION: 1. No spinal cord or cauda equina metastasis. No spinal cord or cauda equina stenosis/compression. But there is severe degenerative right L4 neural foraminal stenosis. 2. No thoracic spine metastasis (absent ribs at T12). Isolated small lumbar metastasis in the L2 vertebral body. 3. Re-demonstrated left C5 vertebral and paraspinal metastasis with infiltration of the left C5 and C6 neural foramina. There is left lateral epidural tumor at the C5 spinal cord level, but no cord compression or enhancement. 4. Extensive soft tissue nodularity in the visible lower abdominal mesentery and lumbar prevertebral space compatible with disseminated soft tissue or lymph node metastases. Associated small volume lower abdominal and pelvic free fluid. 5. Small posterior paraspinal soft tissue metastasis abutting the C7 spinous process. Small clivus metastasis suspected. Electronically Signed   By: Genevie Ann M.D.   On:  01/03/2018 12:12   Ir US Guide Bx Asp/drain  Result Date: 01/01/2018 INDICATION: 84 year old male with right upper lobe mass and omental nodularity concerning for metastatic lung cancer. He presents for ultrasound-guided biopsy of the largest left upper quadrant omental mass. EXAM: Ultrasound-guided biopsy, soft tissue MEDICATIONS: None. ANESTHESIA/SEDATION: A total is 0.5 mg Versed administered intravenously. FLUOROSCOPY TIME:  Fluoroscopy Time: 0 minutes 0 seconds (0 mGy). COMPLICATIONS: None immediate. PROCEDURE: Informed written consent was obtained from the patient after a thorough discussion of the procedural risks, benefits and alternatives. All questions were addressed. Maximal Sterile Barrier Technique was utilized including caps, mask, sterile gowns, sterile gloves, sterile drape, hand hygiene and skin antiseptic. A timeout was performed prior to the initiation of the procedure. The left upper quadrant was interrogated with ultrasound. A large heterogeneous soft tissue mass was successfully identified. A suitable skin entry site was selected and marked. Local anesthesia was attained by infiltration with 1% lidocaine. A small dermatotomy was made. Under real-time ultrasound guidance, an 18 gauge introducer needle was advanced into the margin of the mass. Several core biopsies were then obtained coaxially using the bio Pince automated biopsy device. Biopsy specimens were placed in saline and delivered to pathology for further analysis. IMPRESSION: Ultrasound-guided core biopsy of left upper quadrant omental mass. Electronically Signed   By: Jacqulynn Cadet M.D.   On: 01/01/2018 16:18   Dg Swallowing Func-speech Pathology  Result Date: 01/02/2018 Objective Swallowing Evaluation: Type of Study: MBS-Modified Barium Swallow Study  Patient Details Name: Jay Walters MRN: [id number] Date of Birth: [birth year] Today's Date: 01/02/2018 Time: SLP Start Time (ACUTE ONLY): 0947 -SLP Stop Time (ACUTE ONLY): 1000  SLP Time Calculation (min) (ACUTE ONLY): 13 min Past Medical History: Past Medical History: Diagnosis Date . Anemia  . Arthritis   "right hip; right knee; lower back ~ 1/2 way across" . Atrial fibrillation ([place])   remotely  . Atrial flutter ([place])   typical appearing by ekg. s/p ablation 07/28/11 . Diverticulosis  . Dysphagia   with solids . Esophageal stricture  . GERD (gastroesophageal reflux disease)  . H/O hiatal hernia  . Heart murmur  . Hemorrhoids  . High  cholesterol  . Hypertension  . Osteopenia  . Seizures ([place])   "back w/migraine headaches; 1990's or before" . Sinus bradycardia   asymptomatic . SSS (sick sinus syndrome) ([place])  . Stroke [facility]) 2012  "left me weaker on left side; balance is not good" Past Surgical History: Past Surgical History: Procedure Laterality Date . ASD REPAIR  1972  Patch repair . ATRIAL FLUTTER ABLATION N/A 07/28/2011  Procedure: ATRIAL FLUTTER ABLATION;  Surgeon: Thompson Grayer, MD;  Location: [facility] CATH LAB;  Service: Cardiovascular;  Laterality: N/A; . CARDIAC ELECTROPHYSIOLOGY STUDY AND ABLATION  07/28/11 . CARDIOVASCULAR STRESS TEST  03/07/2010  Perfusion defect in the inferior myocardial region is consistent with diaphragmatic attenuation. No ischemia or infarct/scar is seen in the remaining myocardium. No ECG changes. . CAROTID DOPPLER  06/26/2011  Bilateral ICAs-demonstrated normal patency without eivdence of significant diameter reduction, dissection, or any other vascular abnormality. . INGUINAL HERNIA REPAIR  2011  right . IR US GUIDE BX ASP/DRAIN  01/01/2018 . JOINT REPLACEMENT    right knee replacement . KNEE ARTHROSCOPY    right; "maybe twice" . TOTAL KNEE ARTHROPLASTY  07/10/2012  Procedure: TOTAL KNEE ARTHROPLASTY;  Surgeon: Tobi Bastos, MD;  Location: [facility] ORS;  Service: Orthopedics;  Laterality: Right; . TRANSTHORACIC ECHOCARDIOGRAM  12/10/2012  EF 55-60%. No regional wall motion abnormalities. LA moderately dialted.Mild-moderate regurg of the tricuspid valve. HPI: Pt is an 84  y.o. male who presents with complaints of cough, weakness, shortness of breath and fever for approximately 1 week. 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 Pt had an acute episode of choking on water and slurred speech with MRI positive for bilateral acute occipital lobe infarcts, R > L. BSE in February 2014 with a functional appearing oropharyngeal swallow; regular diet, thin liquids recommended. PMH: GERD, esophagel stricture, dilation, dysphagia to solids, stroke, seizures, HTN, HH, afib, chronic back pain  Subjective: pt alert, cooperative, not a clear historian Assessment / Plan / Recommendation CHL IP CLINICAL IMPRESSIONS 01/02/2018 Clinical Impression Pt has a moderate oropharyngeal dysphagia that may be an acute exacerbation of a likely chronic dysphagia given structural component. Orally he has weak lingual manipulation, tongue pumping, and slow transit. Rather than containing cohesive boluses he allows liquids/solids to spill posteriorly into  the pharynx as his tongue is pumping, which contributes partially to his multiple swallows. Pt does also have large suspected osteophytes (MD not present to confirm), most prominent at C4-5, C5-6, that impacts bolus flow and epiglottic deflection. He has moderate vallecular residue, which may also contribute to his multiple swallows in an attempt to reduce residue. He has aspiration of thin liquids that occurs repeatedly as he continues to swallow a bolus. He says he cannot tuck his chin because he cannot move his neck that way. Limiting bolus size to teaspoon helps to better contain the thin liquids above the laryngeal vestibule, preventing further penetration/aspiration. Pt has better airway protection with thicker substances, but his residue increases as well. Once the vallecula is full from soft solids, nectar thick liquids start to penetrate into the vestibule. Recommend Dys 1 diet and nectar thick liquids. SLP will  f/u to determine if water via provale cup may be appropriate in between meals. Pt would benefit from SLP f/u for utilization of strategies, strengthening to maximize function in light of structural component. SLP Visit Diagnosis Dysphagia, oropharyngeal phase (R13.12) Attention and concentration deficit following -- Frontal lobe and executive function deficit following -- Impact on safety and function  Mild aspiration risk;Moderate aspiration risk   CHL IP TREATMENT RECOMMENDATION 01/02/2018 Treatment Recommendations Therapy as outlined in treatment plan below   Prognosis 01/02/2018 Prognosis for Safe Diet Advancement Fair Barriers to Reach Goals Other (Comment) Barriers/Prognosis Comment -- CHL IP DIET RECOMMENDATION 01/02/2018 SLP Diet Recommendations Dysphagia 1 (Puree) solids;Nectar thick liquid Liquid Administration via Cup;No straw Medication Administration Crushed with puree Compensations Slow rate;Small sips/bites;Follow solids with liquid Postural Changes Remain semi-upright after after feeds/meals (Comment);Seated upright at 90 degrees   CHL IP OTHER RECOMMENDATIONS 01/02/2018 Recommended Consults -- Oral Care Recommendations Oral care BID Other Recommendations --   CHL IP FOLLOW UP RECOMMENDATIONS 01/02/2018 Follow up Recommendations (No Data)   CHL IP FREQUENCY AND DURATION 01/02/2018 Speech Therapy Frequency (ACUTE ONLY) min 2x/week Treatment Duration 2 weeks      CHL IP ORAL PHASE 01/02/2018 Oral Phase Impaired Oral - Pudding Teaspoon -- Oral - Pudding Cup -- Oral - Honey Teaspoon -- Oral - Honey Cup -- Oral - Nectar Teaspoon -- Oral - Nectar Cup Weak lingual manipulation;Lingual pumping;Reduced posterior propulsion;Piecemeal swallowing;Delayed oral transit;Decreased bolus cohesion Oral - Nectar Straw -- Oral - Thin Teaspoon Weak lingual manipulation;Lingual pumping;Reduced posterior propulsion;Piecemeal swallowing;Delayed oral transit;Decreased bolus cohesion Oral - Thin Cup Weak lingual manipulation;Lingual  pumping;Reduced posterior propulsion;Piecemeal swallowing;Delayed oral transit;Decreased bolus cohesion Oral - Thin Straw -- Oral - Puree Weak lingual manipulation;Lingual pumping;Reduced posterior propulsion;Piecemeal swallowing;Delayed oral transit;Decreased bolus cohesion Oral - Mech Soft Weak lingual manipulation;Lingual pumping;Reduced posterior propulsion;Piecemeal swallowing;Delayed oral transit;Decreased bolus cohesion;Impaired mastication Oral - Regular -- Oral - Multi-Consistency -- Oral - Pill -- Oral Phase - Comment --  CHL IP PHARYNGEAL PHASE 01/02/2018 Pharyngeal Phase Impaired Pharyngeal- Pudding Teaspoon -- Pharyngeal -- Pharyngeal- Pudding Cup -- Pharyngeal -- Pharyngeal- Honey Teaspoon -- Pharyngeal -- Pharyngeal- Honey Cup -- Pharyngeal -- Pharyngeal- Nectar Teaspoon -- Pharyngeal -- Pharyngeal- Nectar Cup Delayed swallow initiation-pyriform sinuses;Reduced epiglottic inversion;Reduced airway/laryngeal closure;Reduced tongue base retraction;Pharyngeal residue - valleculae;Penetration/Aspiration during swallow Pharyngeal Material enters airway, remains ABOVE vocal cords then ejected out Pharyngeal- Nectar Straw -- Pharyngeal -- Pharyngeal- Thin Teaspoon Delayed swallow initiation-pyriform sinuses;Reduced epiglottic inversion;Reduced airway/laryngeal closure;Reduced tongue base retraction;Pharyngeal residue - valleculae Pharyngeal -- Pharyngeal- Thin Cup Delayed swallow initiation-pyriform sinuses;Reduced epiglottic inversion;Reduced airway/laryngeal closure;Reduced tongue base retraction;Pharyngeal residue - valleculae;Penetration/Aspiration during swallow;Penetration/Apiration after swallow Pharyngeal Material enters airway, passes BELOW cords and not ejected out despite cough attempt by patient Pharyngeal- Thin Straw -- Pharyngeal -- Pharyngeal- Puree Delayed swallow initiation-pyriform sinuses;Reduced epiglottic inversion;Reduced airway/laryngeal closure;Reduced tongue base retraction;Pharyngeal  residue - valleculae Pharyngeal -- Pharyngeal- Mechanical Soft Delayed swallow initiation-pyriform sinuses;Reduced epiglottic inversion;Reduced airway/laryngeal closure;Reduced tongue base retraction;Pharyngeal residue - valleculae Pharyngeal -- Pharyngeal- Regular -- Pharyngeal -- Pharyngeal- Multi-consistency -- Pharyngeal -- Pharyngeal- Pill -- Pharyngeal -- Pharyngeal Comment --  CHL IP CERVICAL ESOPHAGEAL PHASE 01/02/2018 Cervical Esophageal Phase WFL Pudding Teaspoon -- Pudding Cup -- Honey Teaspoon -- Honey Cup -- Nectar Teaspoon -- Nectar Cup -- Nectar Straw -- Thin Teaspoon -- Thin Cup -- Thin Straw -- Puree -- Mechanical Soft -- Regular -- Multi-consistency -- Pill -- Cervical Esophageal Comment -- No flowsheet data found. Germain Osgood 01/02/2018, 12:47 PM  Germain Osgood, M.A. CCC-SLP [phone]              Assessment and Plan:   1.  Metastatic poorly differentiated carcinoma- clinical presentation/pathology consistent with non-small cell lung cancer  CT chest 12/30/2017-right upper lobe mass, right upper lobe collapse, small right pleural effusion, mediastinal/right hilar adenopathy, bilateral pulmonary nodules, mesenteric/retroperitoneal/peritoneal masses, bilateral adrenal masses, nodular wall thickening  of the gallbladder  Ultrasound-guided biopsy of a left upper quadrant omental mass 01/01/2018-poorly differentiated carcinoma consistent with adenocarcinoma of the lung  MRI cervical and thoracic spine 01/02/2018- left C5 and C6 intraosseous/paraspinal mass, tumor effacing left C5-6 and left C4-5 neural foramen, paraspinous nodules at C7 and T1, clivus metastasis, right T5 facet metastasis  MRI lumbar spine 01/03/2018- L2 vertebral body metastasis, no thickening of the cauda equina nerve roots, no abnormal lumbar intradural enhancement  2.  Altered mental status  3.  Leg weakness- likely multifactorial, no evidence of cord compression  4.  Pain-likely secondary to metastatic  disease involving the bones and paraspinous soft tissues  5.  Bilateral occipital subcortical infarcts noted on MRI 01/01/2018  6.  Atrial flutter  7.  History of a CVA  8.  Malnutrition  Jay Walters has been diagnosed with metastatic poorly differentiated carcinoma.  The clinical presentation and tumor histology are consistent with a diagnosis of metastatic non-small cell lung cancer.  He has a poor performance status.  He is currently confused and unable to participate in conversation.  I explained the diagnosis, prognosis, and treatment options to his wife.  He has widespread metastatic disease.  His prognosis for survival beyond days or weeks is poor.  His wife indicates they have had discussions regarding aggressive interventions in the setting of critical illness.  We discussed CPR and ACLS issues.  She says he would like to be placed on a no CODE BLUE status.  I recommend Hospice care and a referral to consider transfer to [facility].  The leg weakness is likely secondary to deconditioning, degenerative spine disease, and potentially unrecognized carcinomatous meningitis.  He is a never smoker.  There is a chance the tumor could harbor an EGFR mutation.  The pathologist indicates there is insufficient tissue for additional testing.  We will submit peripheral blood for EGFR testing.  Ms. Prusinski understands this testing will take days to a few weeks to result.  Recommendations: 1.  [facility] referral to consider [facility] transfer 2.  No CODE BLUE 3.  Consolidate medical regimen for comfort 4.  Submit peripheral blood for EGFR mutation testing   I will check on him 01/04/2018.  Please call Oncology over the weekend as needed.   Betsy Coder, MD 01/03/2018, 2:54 PM

## 2018-01-03 NOTE — Progress Notes (Signed)
Pharmacy Antibiotic Note Jay Walters is a 84 y.o. male admitted on 12/30/2017 with R upper lobe mass and concern for post obstructive PNA. Initially started on ceftriaxone and azithromycin, but changed to Vancomycin and Zosyn 7/23  BCID with contaminant Afebrile  Day # 3 of broad spectrum antibiotics  Plan: unasyn 1.5 q8  Height: 5\' 8"  (172.7 cm) Weight: 145 lb 4.5 oz (65.9 kg) IBW/kg (Calculated) : 68.4  Temp (24hrs), Avg:98 F (36.7 C), Min:97.9 F (36.6 C), Max:98.1 F (36.7 C)  Recent Labs  Lab 12/30/17 0659 12/30/17 0754 12/30/17 1038 12/31/17 0312 01/01/18 0803 01/02/18 0318 01/03/18 1204  WBC 22.9*  --   --  24.6* 28.7* 29.1* 30.7*  CREATININE 1.23  --   --  0.97 0.90 1.09 1.34*  LATICACIDVEN  --  2.05* 1.66  --   --   --   --      No Known Allergies   Levester Fresh, PharmD, BCPS, BCCCP Clinical Pharmacist [phone]  Please check AMION for all [place] numbers  01/03/2018 4:45 PM

## 2018-01-03 NOTE — NC FL2 (Signed)
[place] LEVEL OF CARE SCREENING TOOL     IDENTIFICATION  Patient Name: Jay Walters Birthdate: 30-Dec-1931 Sex: male Admission Date (Current Location): 12/30/2017  [facility] and [place] Number:  Herbalist and Address:  [facility], [place] [address] [place], [place] [address]      Provider Number: [id number]  Attending Physician Name and Address:  Elmarie Shiley, MD  Relative Name and Phone Number:  Lark Runk, wife, [phone]    Current Level of Care: Hospital Recommended Level of Care: [place] Prior Approval Number:    Date Approved/Denied:   PASRR Number: [id number]  Discharge Plan: SNF    Current Diagnoses: Patient Active Problem List   Diagnosis Date Noted  . Metastatic cancer ([place])   . Hypoxemia   . Protein-calorie malnutrition, severe 12/31/2017  . Cerebral arteriosclerosis with history of previous stroke 12/30/2017  . Acute respiratory insufficiency 12/30/2017  . Bronchogenic carcinoma of right lung ([place]) 12/30/2017  . Metastatic lung cancer (metastasis from lung to other site) ([place]) 12/30/2017  . Pneumonia of upper lobe due to infectious organism ([place]) 12/30/2017  . Bilateral lower extremity edema 11/01/2017  . Chronic diastolic heart failure ([place]) 11/01/2017  . Mixed hyperlipidemia 05/02/2016  . Dependent edema 07/01/2015  . S/P ablation of atrial flutter 03/24/2013  . Spinal stenosis of lumbar region 01/07/2013  . S/P right unicompartmental knee replacement 10/07/2012  . Long term current use of anticoagulant therapy 09/28/2012  . Cervical disc disease with myelopathy 08/10/2012  . HTN (hypertension) 07/28/2012  . Pulmonary nodules 09/19/2011  . Atrial flutter ([place]) 08/28/2011  . Allergic rhinitis 08/28/2011  . Status post patch closure of ASD 05/09/2011  . Seizure disorder ([place]) 04/18/2011  . BPH (benign prostatic hyperplasia) 04/18/2011  . Osteopenia 04/18/2011  . Vitamin  D deficiency 04/18/2011  . GERD 08/16/2010    Orientation RESPIRATION BLADDER Height & Weight     Self, Place  O2(2L nasal canula) Incontinent, External catheter Weight: 145 lb 4.5 oz (65.9 kg) Height:  5\' 8"  (172.7 cm)  BEHAVIORAL SYMPTOMS/MOOD NEUROLOGICAL BOWEL NUTRITION STATUS      Continent Diet  AMBULATORY STATUS COMMUNICATION OF NEEDS Skin   Extensive Assist Verbally(slurred) Surgical wounds(incision on left side)                       Personal Care Assistance Level of Assistance  Bathing, Feeding, Dressing Bathing Assistance: Maximum assistance Feeding assistance: Limited assistance Dressing Assistance: Maximum assistance     Functional Limitations Info  Sight, Hearing, Speech Sight Info: Adequate Hearing Info: Adequate Speech Info: Impaired(slurred)    SPECIAL CARE FACTORS FREQUENCY  PT (By licensed PT), OT (By licensed OT)     PT Frequency: 5x week OT Frequency: 5x week            Contractures Contractures Info: Not present    Additional Factors Info  Code Status, Allergies Code Status Info: Full Code Allergies Info: No Known Allergies           Current Medications (01/03/2018):  This is the current hospital active medication list Current Facility-Administered Medications  Medication Dose Route Frequency Provider Last Rate Last Dose  . 0.45 % sodium chloride infusion   Intravenous Continuous Regalado, Belkys A, MD 75 mL/hr at 01/03/18 1411    . 0.9 %  sodium chloride infusion  250 mL Intravenous PRN Lady Deutscher, MD      . acetaminophen (TYLENOL) tablet 650 mg  650 mg Oral Q6H PRN Lady Deutscher, MD   650 mg at 12/31/17 1631   Or  . acetaminophen (TYLENOL) suppository 650 mg  650 mg Rectal Q6H PRN Lady Deutscher, MD      . atorvastatin (LIPITOR) tablet 20 mg  20 mg Oral q1800 Lady Deutscher, MD   20 mg at 01/02/18 1853  . [START ON 01/04/2018] azithromycin (ZITHROMAX) tablet 500 mg  500 mg Oral Daily Regalado, Belkys A, MD       . ferrous sulfate tablet 325 mg  325 mg Oral BID PC Lady Deutscher, MD   325 mg at 01/03/18 0849  . finasteride (PROSCAR) tablet 5 mg  5 mg Oral Daily Lady Deutscher, MD   5 mg at 01/03/18 0849  . HYDROcodone-acetaminophen (NORCO) 10-325 MG per tablet 1 tablet  1 tablet Oral Q4H PRN Lady Deutscher, MD   1 tablet at 12/30/17 2114  . hydroxypropyl methylcellulose / hypromellose (ISOPTO TEARS / GONIOVISC) 2.5 % ophthalmic solution 1 drop  1 drop Both Eyes Daily PRN Lady Deutscher, MD      . multivitamin with minerals tablet 1 tablet  1 tablet Oral Daily Lady Deutscher, MD   1 tablet at 01/03/18 8242  . ondansetron (ZOFRAN) tablet 4 mg  4 mg Oral Q6H PRN Lady Deutscher, MD       Or  . ondansetron [facility]) injection 4 mg  4 mg Intravenous Q6H PRN Lady Deutscher, MD      . phenytoin (DILANTIN) chewable tablet 100 mg  100 mg Oral TID Gardiner Barefoot, NP   100 mg at 01/03/18 0850  . piperacillin-tazobactam (ZOSYN) IVPB 3.375 g  3.375 g Intravenous Q8H Regalado, Belkys A, MD 12.5 mL/hr at 01/03/18 1150    . RESOURCE THICKENUP CLEAR   Oral PRN Regalado, Belkys A, MD      . sodium chloride flush (NS) 0.9 % injection 3 mL  3 mL Intravenous Q12H Lady Deutscher, MD   Stopped at 01/01/18 [phone]  . sodium chloride flush (NS) 0.9 % injection 3 mL  3 mL Intravenous PRN Lady Deutscher, MD      . traZODone (DESYREL) tablet 25 mg  25 mg Oral QHS PRN Lady Deutscher, MD   25 mg at 12/30/17 2115  . Warfarin - Pharmacist Dosing Inpatient   Does not apply q1800 Regalado, Belkys A, MD         Discharge Medications: Please see discharge summary for a list of discharge medications.  Relevant Imaging Results:  Relevant Lab Results:   Additional Information SS# [place], [place]

## 2018-01-03 NOTE — Progress Notes (Signed)
PT Cancellation Note  Patient Details Name: Jay Walters MRN: [id number] DOB: [birth year]   Cancelled Treatment:    Reason Eval/Treat Not Completed: Patient at procedure or test/unavailable (MRI). Will follow-up for PT evaluation as schedule permits.  Mabeline Caras, PT, DPT Acute Rehab Services  Pager: [phone] 01/03/2018, 10:37 AM

## 2018-01-03 NOTE — Evaluation (Signed)
Occupational Therapy Evaluation Patient Details Name: Jay Walters MRN: [id number] DOB: 10/02/1931 Today's Date: 01/03/2018    History of Present Illness Pt is an 84 y.o. male admitted 12/30/17 with c/o cough,  weakness, SOB, and fever. 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 MRI positive for bilateral acute occipital lobe infarcts; metastatic disease to cervical and lumbar spine; no spinal cord or cauda equina metastasis. PMH includes dysphagia, CVA, seizures, HTN, a-fib, chronic back pain.   Clinical Impression   Pt was ambulating with a walker and was able to stay alone while his wife worked per his friends who are bedside. Pt presents with unintelligible speech, impaired cognition, significant global weakness and impaired balance. He requires total assist for all ADL and +2 total assist for mobility. Recommending SNF upon discharge. Will follow acutely.    Follow Up Recommendations  SNF;Supervision/Assistance - 24 hour    Equipment Recommendations       Recommendations for Other Services       Precautions / Restrictions Precautions Precautions: Fall Restrictions Weight Bearing Restrictions: No      Mobility Bed Mobility Overal bed mobility: Needs Assistance Bed Mobility: Supine to Sit;Sit to Supine     Supine to sit: Total assist;+2 for physical assistance Sit to supine: Total assist;+2 for physical assistance   General bed mobility comments: Minimal engagement of LLE towards EOB with max multimodal cues, eventually requiring totalA for trunk elevation and scooting hips to EOB  Transfers Overall transfer level: Needs assistance Equipment used: 2 person hand held assist Transfers: Sit to/from Stand Sit to Stand: Total assist;+2 physical assistance         General transfer comment: Attemped sit<>stand 3x requiring totalA despite max multimodal cues for BLE/trunk extension; unable to achieve fully upright    Balance  Overall balance assessment: Needs assistance   Sitting balance-Leahy Scale: Zero Sitting balance - Comments: Pt only engaging core to come upright from leaning on each elbow; maxA to maintain seated balance. No anterior/posterior trunk engagement     Standing balance-Leahy Scale: Zero                             ADL either performed or assessed with clinical judgement   ADL                                         General ADL Comments: Requires total assist.     Vision   Vision Assessment?: No apparent visual deficits Additional Comments: appears intact     Perception     Praxis      Pertinent Vitals/Pain Pain Assessment: Faces Faces Pain Scale: Hurts little more Pain Location: Back, neck Pain Descriptors / Indicators: Discomfort;Grimacing Pain Intervention(s): Monitored during session;Repositioned     Hand Dominance Right   Extremity/Trunk Assessment Upper Extremity Assessment Upper Extremity Assessment: Generalized weakness   Lower Extremity Assessment Lower Extremity Assessment: Defer to PT evaluation   Cervical / Trunk Assessment Cervical / Trunk Assessment: Kyphotic   Communication Communication Communication: Expressive difficulties   Cognition Arousal/Alertness: Awake/alert Behavior During Therapy: Flat affect Overall Cognitive Status: Difficult to assess Area of Impairment: Attention;Following commands;Problem solving                   Current Attention Level: Sustained   Following Commands: Follows one step commands inconsistently  Problem Solving: Slow processing;Decreased initiation;Difficulty sequencing;Requires verbal cues General Comments: Pt able to state name, but majority of words difficult to understand as pt mumbling. Following commands inconsistently; physically engaging very little during session (unsure if decreased initiation or more weakness). Initially with poor attention to conversation on the  left side. Friends present and report pt normally speaks clearly with appropriate cognition   General Comments  SpO2 down to 80% on RA, returning to 94% on 2L O2 [place]    Exercises     Shoulder Instructions      Home Living Family/patient expects to be discharged to:: Private residence Living Arrangements: Spouse/significant other Available Help at Discharge: Family;Available PRN/intermittently Type of Home: House Home Access: Stairs to enter Entrance Stairs-Number of Steps: 3   Home Layout: Two level;Bed/bath upstairs Alternate Level Stairs-Number of Steps: Flight   Bathroom Shower/Tub: Tub/shower unit         Home Equipment: Environmental consultant - 2 wheels;Cane - single point;Walker - 4 wheels   Additional Comments: Pt poor historian/difficult to understand; friends present to assist with details. Report pt's wife works so pt is home alone most of day      Prior Functioning/Environment Level of Independence: Needs assistance  Gait / Transfers Assistance Needed: Pt difficult to understand/poor historian. Per friends present, ambulated slowly, but mod indep with rollator; does not drive     Comments: Pt stayed alone during the day while his wife worked.        OT Problem List: Decreased strength;Decreased activity tolerance;Impaired balance (sitting and/or standing);Decreased coordination;Decreased cognition;Decreased safety awareness;Decreased knowledge of use of DME or AE;Pain;Impaired UE functional use      OT Treatment/Interventions: Self-care/ADL training;DME and/or AE instruction;Cognitive remediation/compensation;Patient/family education;Balance training;Therapeutic activities;Therapeutic exercise    OT Goals(Current goals can be found in the care plan section) Acute Rehab OT Goals Patient Stated Goal: None stated (agreeable to working with therapy) OT Goal Formulation: Patient unable to participate in goal setting Time For Goal Achievement: 01/17/18 Potential to Achieve Goals:  Fair ADL Goals Pt Will Perform Eating: with min assist;sitting;bed level Pt Will Perform Grooming: with min assist;sitting Pt Will Perform Upper Body Dressing: sitting;with min assist Pt Will Transfer to Toilet: with +2 assist;with min assist;bedside commode;stand pivot transfer Additional ADL Goal #1: Pt will perform bed mobility with moderate assistance in preparation for ADL. Additional ADL Goal #2: Pt will follow one step commands with 50% accuracy.  OT Frequency: Min 2X/week   Barriers to D/C: Decreased caregiver support          Co-evaluation PT/OT/SLP Co-Evaluation/Treatment: Yes Reason for Co-Treatment: For patient/therapist safety PT goals addressed during session: Mobility/safety with mobility OT goals addressed during session: ADL's and self-care;Strengthening/ROM      AM-PAC PT "6 Clicks" Daily Activity     Outcome Measure Help from another person eating meals?: Total Help from another person taking care of personal grooming?: Total Help from another person toileting, which includes using toliet, bedpan, or urinal?: Total Help from another person bathing (including washing, rinsing, drying)?: Total Help from another person to put on and taking off regular upper body clothing?: Total Help from another person to put on and taking off regular lower body clothing?: Total 6 Click Score: 6   End of Session Equipment Utilized During Treatment: Gait belt;Oxygen Nurse Communication: Mobility status;Other (comment)(02 requirement)  Activity Tolerance: Patient limited by fatigue Patient left: in bed;with call bell/phone within reach;with bed alarm set;with family/visitor present  OT Visit Diagnosis: Unsteadiness on feet (R26.81);Other abnormalities of gait  and mobility (R26.89);Pain;Muscle weakness (generalized) (M62.81);Other symptoms and signs involving cognitive function                Time: 3790-2409 OT Time Calculation (min): 30 min Charges:  OT General Charges $OT  Visit: 1 Visit OT Evaluation $OT Eval Moderate Complexity: 1 Mod  01/03/2018 Nestor Lewandowsky, OTR/L Pager: [phone] Werner Lean, [place] 01/03/2018, 2:45 PM

## 2018-01-03 NOTE — Evaluation (Signed)
Physical Therapy Evaluation Patient Details Name: Jay Walters MRN: [id number] DOB: [birth year] Today's Date: 01/03/2018   History of Present Illness  Pt is an 84 y.o. male admitted 12/30/17 with c/o cough,  weakness, SOB, and fever. 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 MRI positive for bilateral acute occipital lobe infarcts; metastatic disease to cervical and lumbar spine; no spinal cord or cauda equina metastasis. PMH includes dysphagia, CVA, seizures, HTN, a-fib, chronic back pain.    Clinical Impression  Pt presents with an overall decrease in functional mobility secondary to above. Pt poor historian. Per chart, was ambulatory with RW until increasing weakness over the past few weeks; friends present and report pt lives with wife who works during the day. Today, pt required totalA (+2) to sit EOB and attempt standing. Demonstrates decreased attention, awareness, and mumbling conversation that is difficult to understand. Pt would benefit from continued acute PT services to maximize functional mobility and independence prior to d/c with SNF-level therapies.     Follow Up Recommendations SNF;Supervision/Assistance - 24 hour    Equipment Recommendations  (TBD)    Recommendations for Other Services       Precautions / Restrictions Precautions Precautions: Fall Restrictions Weight Bearing Restrictions: No      Mobility  Bed Mobility Overal bed mobility: Needs Assistance Bed Mobility: Supine to Sit;Sit to Supine     Supine to sit: Total assist;+2 for physical assistance Sit to supine: Total assist;+2 for physical assistance   General bed mobility comments: Minimal engagement of LLE towards EOB with max multimodal cues, eventually requiring totalA for trunk elevation and scooting hips to EOB  Transfers Overall transfer level: Needs assistance Equipment used: 2 person hand held assist Transfers: Sit to/from Stand Sit to Stand:  Total assist;+2 physical assistance         General transfer comment: Attemped sit<>stand 3x requiring totalA despite max multimodal cues for BLE/trunk extension; unable to achieve fully upright  Ambulation/Gait             General Gait Details: NT  Stairs            Wheelchair Mobility    Modified Rankin (Stroke Patients Only) Modified Rankin (Stroke Patients Only) Pre-Morbid Rankin Score: No symptoms Modified Rankin: Severe disability     Balance Overall balance assessment: Needs assistance   Sitting balance-Leahy Scale: Zero Sitting balance - Comments: Pt only engaging core to come upright from leaning on each elbow; maxA to maintain seated balance. No anterior/posterior trunk engagement     Standing balance-Leahy Scale: Zero                               Pertinent Vitals/Pain Pain Assessment: Faces Faces Pain Scale: Hurts little more Pain Location: Back Pain Descriptors / Indicators: Discomfort;Grimacing Pain Intervention(s): Monitored during session;Limited activity within patient's tolerance;Repositioned    Home Living Family/patient expects to be discharged to:: Private residence Living Arrangements: Spouse/significant other Available Help at Discharge: Family;Available PRN/intermittently Type of Home: House Home Access: Stairs to enter   Entrance Stairs-Number of Steps: 3 Home Layout: Two level;Bed/bath upstairs Home Equipment: Walker - 2 wheels;Cane - single point;Walker - 4 wheels Additional Comments: Pt poor historian/difficult to understand; friends present to assist with details. Report pt's wife works so pt is home alone most of day    Prior Function Level of Independence: Needs assistance   Gait / Transfers Assistance Needed: Pt difficult to  understand/poor historian. Per friends present, ambulated slowly, but mod indep with rollator; does not drive           Hand Dominance        Extremity/Trunk Assessment    Upper Extremity Assessment Upper Extremity Assessment: Difficult to assess due to impaired cognition;Generalized weakness    Lower Extremity Assessment Lower Extremity Assessment: Difficult to assess due to impaired cognition;Generalized weakness(Seems to have decreased attention to R extremities more so than L; functional observation <3/5)    Cervical / Trunk Assessment Cervical / Trunk Assessment: Kyphotic  Communication   Communication: Expressive difficulties  Cognition Arousal/Alertness: Awake/alert Behavior During Therapy: Flat affect Overall Cognitive Status: Difficult to assess Area of Impairment: Attention;Following commands;Problem solving                   Current Attention Level: Sustained   Following Commands: Follows one step commands inconsistently     Problem Solving: Slow processing;Decreased initiation;Difficulty sequencing;Requires verbal cues General Comments: Pt able to state name, but majority of words difficult to understand as pt mumbling. Following commands inconsistently; physically engaging very little during session (unsure if decreased initiation or more weakness). Initially with poor attention to conversation on the left side. Friends present and report pt normally speaks clearly with appropriate cognition      General Comments General comments (skin integrity, edema, etc.): SpO2 down to 80% on RA, returning to 94% on 2L O2 [place]    Exercises     Assessment/Plan    PT Assessment Patient needs continued PT services  PT Problem List Decreased strength;Decreased activity tolerance;Decreased balance;Decreased mobility;Decreased cognition;Decreased knowledge of use of DME       PT Treatment Interventions DME instruction;Gait training;Stair training;Functional mobility training;Therapeutic activities;Therapeutic exercise;Balance training;Patient/family education;Wheelchair mobility training;Cognitive remediation    PT Goals (Current goals can be  found in the Care Plan section)  Acute Rehab PT Goals Patient Stated Goal: None stated (agreeable to working with therapy) PT Goal Formulation: With patient Time For Goal Achievement: 01/17/18 Potential to Achieve Goals: Fair    Frequency Min 2X/week   Barriers to discharge Decreased caregiver support;Inaccessible home environment      Co-evaluation PT/OT/SLP Co-Evaluation/Treatment: Yes Reason for Co-Treatment: Complexity of the patient's impairments (multi-system involvement);For patient/therapist safety;To address functional/ADL transfers PT goals addressed during session: Mobility/safety with mobility         AM-PAC PT "6 Clicks" Daily Activity  Outcome Measure Difficulty turning over in bed (including adjusting bedclothes, sheets and blankets)?: Unable Difficulty moving from lying on back to sitting on the side of the bed? : Unable Difficulty sitting down on and standing up from a chair with arms (e.g., wheelchair, bedside commode, etc,.)?: Unable Help needed moving to and from a bed to chair (including a wheelchair)?: Total Help needed walking in hospital room?: Total Help needed climbing 3-5 steps with a railing? : Total 6 Click Score: 6    End of Session Equipment Utilized During Treatment: Gait belt Activity Tolerance: Patient limited by fatigue Patient left: in bed;with call bell/phone within reach;with family/visitor present;with bed alarm set Nurse Communication: Mobility status PT Visit Diagnosis: Other abnormalities of gait and mobility (R26.89);Muscle weakness (generalized) (M62.81)    Time: 2836-6294 PT Time Calculation (min) (ACUTE ONLY): 26 min   Charges:   PT Evaluation $PT Eval Moderate Complexity: 1 Mod         Mabeline Caras, PT, DPT Acute Rehab Services  Pager: [place] 01/03/2018, 2:38 PM

## 2018-01-03 NOTE — Social Work (Signed)
CSW acknowledging SNF recommendation for pt.  Will continue following.   Alexander [place], [place] [facility] [phone]

## 2018-01-03 NOTE — Progress Notes (Addendum)
PROGRESS NOTE    Jay Walters  PIR:[id number] DOB: 03-25-1932 DOA: 12/30/2017 PCP: Elby Showers, MD    Brief Narrative: Jay Walters is a 84 y.o. male with medical history significant of pretension, stroke, chronic back pain regard to cervical disc and lumbar disc myelopathy atrial fibrillation and flutter now in presents the emergency department with complaints of cough, weakness, shortness of breath and fever for approximately 1 week.  Denies any abdominal pain, chest pain, nausea vomiting diarrhea, hemoptysis, dizziness, diaphoresis, trauma, peripheral edema or other complaints.  He states that his cough is fairly wet without significant sputum production.  He also reports some associated weight loss of wife thinks approximately 12 pounds in the past week to 10 days.  He has had very poor appetite and has been unable to eat very much at all.  His shortness of breath has worsened to the point where when he presented to the emergency department his sats were 88%.  He does not use any home oxygen.  He has no prior history of smoking.  He does not have any occupational exposures to fumes, fibers, or known lung toxins.  Still complains of some malodorous urine.  In the emergency department his white blood cell count was noted to be 22,000, his lactate is elevated at 2 his BNP is also elevated.  Found to have a history of atrial flutter and an echocardiogram in 6301 showed diastolic dysfunction with an ejection fraction of 55 to 60% with a severely dilated right atrium.  Possibly low at 77/52 with respirations of 29 on presentation when I saw the patient he looks very comfortable.  He has received 2 L of fluid boluses in the emergency department as well as ceftriaxone and azithromycin.  His chest x-ray showed significant abnormalities in the right upper lobe with a severe consolidation and possible pleural fluid.  A CT scan was obtained which is consistent with a right upper lobe bronchogenic tumor with  metastases to peritoneal and retroperitoneal lymph nodes, hilar lymph nodes, adrenal glands, the largest abdominal lymph node measuring 7.7 cm.  Conversation with his wife she reports that the patient has recently seen his neurosurgeon due to pain across his shoulders and back that was thought due to his cervical disease.  He received steroid injections in his shoulder with no relief.  [facility] hospitalist were asked to admit. Admitted with lung mass, leukocytosis, possible post obstructive PNA. Develops slurred speech and worsening weakness lower extremities. He has chronic back pain and some myelopathy. He was found to have bilateral occipital stroke, incidental finding. Stroke doesn't correlate with Lower extremities weakness. Cervical , thoracic MRI ordered which showed cervical and para spinal mass consistent with metastasis diseases. Discussed MRI result with Dr Leonie Man, finding appears chronic. We need to get Lumbar spine MRI, depending on results, will start Decadron and radiation oncology consult.      Assessment & Plan:   Principal Problem:   Bronchogenic carcinoma of right lung ([place]) Active Problems:   Atrial flutter (HCC)   Cervical disc disease with myelopathy   Long term current use of anticoagulant therapy   Chronic diastolic heart failure (HCC)   Cerebral arteriosclerosis with history of previous stroke   Acute respiratory insufficiency   Metastatic lung cancer (metastasis from lung to other site) [facility])   Pneumonia of upper lobe due to infectious organism ([place])   Protein-calorie malnutrition, severe   Metastatic cancer (HCC)   Hypoxemia  1-Bilateral acute non hemorrhagic occipital lobe infarct Patient had shocking  episode on water, per sister his speech is slurred on 7-22. He would slurred a few words when I first saw him 7-22 early morning.  Sister came later and reported slurred speech was new. I did neuro exam and  his motor strength was  5/5, he does slurred some words at  time.  -CT head was negative for bleeding.  -overnight he develops worsening slurred speech, and worsening  lower extremities weakness.  -Neurology consulted.  -MRI positive for Bilateral acute nonhemorrhagic occipital lobe infarcts, right greater than left. There is cortical involvement on the right measuring up to 17 mm. The left occipital infarct is contain to the white matter. -EEG. Background slowing, diffuse gray matter disturbance , nonspecific, dementia. No epileptiform activity.  -ABG negative for hypercapnia.  -coumadin resume, no need to bridge with heparin. Discussed with neurology   Right upper lobe lung Mass;  Pulmonary following.  IR consulted for  Biopsy. Underwent Biopsy 7-23. Awaiting biopsy result.  Dr Benay Spice consulted. Pathology results pending.   -Cervical disc disease with myelopathy:  Patient has well described cervical and lumbar back problems he is on chronic pain medications. He develops some worsening Lower extremities weakness om 7-23.  -MRI cervical and thoracic spine ; LEFT C5 and C6 interosseous and paraspinal mass consistent with metastatic disease. Paraspinous nodules C7 and T1 concerning for metastatic disease. Potential clival metastasis. No pathologic fracture. Contrast enhanced sequences would better characterize tumor. Bilateral T12 rib osseous metastasis. Extensive mediastinal and RIGHT lung metastasis. RIGHT T5 facet metastasis without pathologic fracture. Contrast enhanced sequences may detected additional metastasis. No canal stenosis or neural foraminal narrowing. -Discussed case with Dr Benay Spice he recommend decadron 10 IV Q 6 hours if neurology think LE weakness is related to malignancy affecting cord.  -Discussed with Dr Leonie Man, he reviewed Cervical and thoracic spine, finding appears chronic. He wants to hold on IV decadron until we get Lumbar spine MRI. Depending on Lumbar spine results might need to consult radiation oncology.  Nurse called  MRI department to let them know that MRI is Stat.  Addendum;  Discussed with Dr Leonie Man, there is no cord compression, there is epidural involvement. He is worry about leptomeningeal metastasis. Updated Dr Benay Spice.  Patient more sleepy this afternoon after ativan for MRI.    -Post obstructive PNA; He was initially on ceftriaxone and azithromycin.  Started zosyn and vancomycin 7-23.---stop vancomycin. Repeated blood culture negative. Will change zosyn to unazyn until further clarification for goals of care.  Continue with IV antibiotics.   -Acute hypoxic respiratory failure secondary to number one.  Continue with oxygen supplementation.   -A flutter;  History of ablation in the past.  On coumadin. Holding coumadin in anticipation of biopsy.  INR supra-therapeutic at 10. Received a dose of vitamin K.  On coumadin.   -Chronic diastolic HF;  Appears compensated.   -one of two blood culture positive for staph, coagulase. Likely contaminant  Follow culture.  Worsening leukocytosis, started  IV vancomycin  Repeated blood culture 7-23 no growth   Hypernatremia; change IV fluids to half NS. Labs pending for the morning.   Sepsis was ruled in. Tachypnea, leukocytosis. Source of infection PNA, post obstructive    DVT prophylaxis: start heparin when INR less than 2.  Code Status: full code.  Family Communication: Wife updated on 7-24.  Disposition Plan: Home when stable.   Consultants:   Pulmonology    Procedures:     Antimicrobials:  Ceftriaxone 7-21--7-23 Azithromycin 7-21-- Vancomycin 7-23 Zosyn 7-23  Subjective: He is alert, speech more clear. Report breathing ok. Mild cough.    Objective: Vitals:   01/02/18 0743 01/02/18 2304 01/03/18 0500 01/03/18 0741  BP: 115/65 (!) 105/49  (!) 102/53  Pulse: (!) 59 64  81  Resp: 20 18  17   Temp: 97.7 F (36.5 C) 97.9 F (36.6 C)  98.1 F (36.7 C)  TempSrc: Oral Oral  Oral  SpO2: 96% 97%  94%  Weight:   65.9 kg (145 lb  4.5 oz)   Height:        Intake/Output Summary (Last 24 hours) at 01/03/2018 0909 Last data filed at 01/03/2018 0630 Gross per 24 hour  Intake 1592.97 ml  Output 600 ml  Net 992.97 ml   Filed Weights   12/30/17 0845 01/01/18 0500 01/03/18 0500  Weight: 60.3 kg (133 lb) 65.4 kg (144 lb 2.9 oz) 65.9 kg (145 lb 4.5 oz)    Examination:  General exam: Chronically ill.  Respiratory system: Normal respiratory effort, crackles right  Cardiovascular system: S 1, S 2 RRR Gastrointestinal system: BS present, soft, nt Central nervous system: Alert , confuse, Slurred speech improved, Bilateral LE weakness.  Extremities: No edema Skin: No rashes.      Data Reviewed: I have personally reviewed following labs and imaging studies  CBC: Recent Labs  Lab 12/30/17 0659 12/31/17 0312 01/01/18 0803 01/02/18 0318  WBC 22.9* 24.6* 28.7* 29.1*  NEUTROABS 18.0*  --   --   --   HGB 13.2 12.4* 12.4* 12.4*  HCT 41.7 38.1* 39.3 39.7  MCV 96.5 94.1 97.5 98.3  PLT 287 232 227 196   Basic Metabolic Panel: Recent Labs  Lab 12/30/17 0659 12/31/17 0312 01/01/18 0803 01/02/18 0318  NA 143 142 144 148*  K 3.7 3.6 4.3 4.2  CL 110 112* 116* 119*  CO2 20* 22 19* 20*  GLUCOSE 117* 142* 105* 113*  BUN 28* 28* 27* 29*  CREATININE 1.23 0.97 0.90 1.09  CALCIUM 8.2* 8.3* 8.1* 8.3*   GFR: Estimated Creatinine Clearance: 47 mL/min (by C-G formula based on SCr of 1.09 mg/dL). Liver Function Tests: Recent Labs  Lab 12/30/17 0659 01/02/18 0318  AST 16  --   ALT 15  --   ALKPHOS 67  --   BILITOT 0.9  --   PROT 5.2*  --   ALBUMIN 2.1* 1.9*   No results for input(s): LIPASE, AMYLASE in the last 168 hours. Recent Labs  Lab 01/01/18 1113  AMMONIA 21   Coagulation Profile: Recent Labs  Lab 12/31/17 0312 01/01/18 0202 01/01/18 1637 01/02/18 0318 01/03/18 0247  INR 3.87 1.61 1.57 1.75 2.42   Cardiac Enzymes: Recent Labs  Lab 01/01/18 2132  CKTOTAL 107   BNP (last 3 results) No  results for input(s): PROBNP in the last 8760 hours. HbA1C: Recent Labs    01/03/18 0247  HGBA1C 5.8*   CBG: Recent Labs  Lab 01/01/18 0742 01/02/18 0739 01/03/18 0744  GLUCAP 115* 100* 104*   Lipid Profile: Recent Labs    01/03/18 0247  CHOL 93  HDL 25*  LDLCALC 35  TRIG 165*  CHOLHDL 3.7   Thyroid Function Tests: Recent Labs    01/01/18 1113  TSH 1.748   Anemia Panel: Recent Labs    01/01/18 1113  VITAMINB12 317   Sepsis Labs: Recent Labs  Lab 12/30/17 0754 12/30/17 1038  LATICACIDVEN 2.05* 1.66    Recent Results (from the past 240 hour(s))  Blood Culture (routine x 2)  Status: Abnormal   Collection Time: 12/30/17  7:55 AM  Result Value Ref Range Status   Specimen Description BLOOD RIGHT HAND  Final   Special Requests   Final    BOTTLES DRAWN AEROBIC AND ANAEROBIC Blood Culture adequate volume   Culture  Setup Time   Final    GRAM POSITIVE COCCI AEROBIC BOTTLE ONLY CRITICAL RESULT CALLED TO, READ BACK BY AND VERIFIED WITH: PHARMD M TURNER [id number] MLM    Culture (A)  Final    STAPHYLOCOCCUS SPECIES (COAGULASE NEGATIVE) THE SIGNIFICANCE OF ISOLATING THIS ORGANISM FROM A SINGLE SET OF BLOOD CULTURES WHEN MULTIPLE SETS ARE DRAWN IS UNCERTAIN. PLEASE NOTIFY THE MICROBIOLOGY DEPARTMENT WITHIN ONE WEEK IF SPECIATION AND SENSITIVITIES ARE REQUIRED. Performed at [facility], [place] [address] [place], [place] [address]    Report Status 01/02/2018 FINAL  Final  Blood Culture ID Panel (Reflexed)     Status: Abnormal   Collection Time: 12/30/17  7:55 AM  Result Value Ref Range Status   Enterococcus species NOT DETECTED NOT DETECTED Final   Listeria monocytogenes NOT DETECTED NOT DETECTED Final   Staphylococcus species DETECTED (A) NOT DETECTED Final    Comment: Methicillin (oxacillin) susceptible coagulase negative staphylococcus. Possible blood culture contaminant (unless isolated from more than one blood culture draw or clinical case suggests  pathogenicity). No antibiotic treatment is indicated for blood  culture contaminants. CRITICAL RESULT CALLED TO, READ BACK BY AND VERIFIED WITH: PHARMD M TURNER [id number] MLM    Staphylococcus aureus NOT DETECTED NOT DETECTED Final   Methicillin resistance NOT DETECTED NOT DETECTED Final   Streptococcus species NOT DETECTED NOT DETECTED Final   Streptococcus agalactiae NOT DETECTED NOT DETECTED Final   Streptococcus pneumoniae NOT DETECTED NOT DETECTED Final   Streptococcus pyogenes NOT DETECTED NOT DETECTED Final   Acinetobacter baumannii NOT DETECTED NOT DETECTED Final   Enterobacteriaceae species NOT DETECTED NOT DETECTED Final   Enterobacter cloacae complex NOT DETECTED NOT DETECTED Final   Escherichia coli NOT DETECTED NOT DETECTED Final   Klebsiella oxytoca NOT DETECTED NOT DETECTED Final   Klebsiella pneumoniae NOT DETECTED NOT DETECTED Final   Proteus species NOT DETECTED NOT DETECTED Final   Serratia marcescens NOT DETECTED NOT DETECTED Final   Haemophilus influenzae NOT DETECTED NOT DETECTED Final   Neisseria meningitidis NOT DETECTED NOT DETECTED Final   Pseudomonas aeruginosa NOT DETECTED NOT DETECTED Final   Candida albicans NOT DETECTED NOT DETECTED Final   Candida glabrata NOT DETECTED NOT DETECTED Final   Candida krusei NOT DETECTED NOT DETECTED Final   Candida parapsilosis NOT DETECTED NOT DETECTED Final   Candida tropicalis NOT DETECTED NOT DETECTED Final    Comment: Performed at [facility], [address] [place], [place] [address]  Blood Culture (routine x 2)     Status: None (Preliminary result)   Collection Time: 12/30/17  8:00 AM  Result Value Ref Range Status   Specimen Description BLOOD LEFT ANTECUBITAL  Final   Special Requests   Final    BOTTLES DRAWN AEROBIC AND ANAEROBIC Blood Culture adequate volume   Culture   Final    NO GROWTH 3 DAYS Performed at [facility], [place] [address] [place],  [address]    Report Status PENDING   Incomplete  Culture, blood (routine x 2)     Status: None (Preliminary result)   Collection Time: 01/01/18  4:40 PM  Result Value Ref Range Status   Specimen Description BLOOD RIGHT ARM  Final   Special Requests   Final    BOTTLES DRAWN AEROBIC ONLY Blood Culture adequate volume   Culture   Final    NO GROWTH < 24 HOURS Performed at [facility], [address], [place], [place] [address]    Report Status PENDING  Incomplete  Culture, blood (routine x 2)     Status: None (Preliminary result)   Collection Time: 01/01/18  4:45 PM  Result Value Ref Range Status   Specimen Description BLOOD RIGHT HAND  Final   Special Requests   Final    BOTTLES DRAWN AEROBIC ONLY Blood Culture adequate volume   Culture   Final    NO GROWTH < 24 HOURS Performed at [facility], [place] [address], [place], [place] [address]    Report Status PENDING  Incomplete         Radiology Studies: Mr Brain Wo Contrast  Result Date: 01/01/2018 CLINICAL DATA:  Slurred speech and dizziness. Encephalopathy. Altered level of consciousness. EXAM: MRI HEAD WITHOUT CONTRAST TECHNIQUE: Multiplanar, multiecho pulse sequences of the brain and surrounding structures were obtained without intravenous contrast. COMPARISON:  CT head without contrast 01/01/2018. MRI brain 07/29/2012. FINDINGS: Brain: A focal cortical infarct is present the margin of the right occipital and parietal lobe. This infarct measures up to 17 mm. A punctate left occipital lobe white matter infarct is present. T2 signal change is associated with both areas of acute infarct. There is no acute hemorrhage. Advanced atrophy and confluent white matter disease demonstrate some progression since 2014. Cavum septum pellucidum is incidentally noted. Dilated perivascular spaces are present. The internal auditory canals are within normal limits. The brainstem and cerebellum are normal. Remote infarcts are noted along the corpus callosum. Vascular: Flow is  present in the major intracranial arteries. Skull and upper cervical spine: Skull base is within normal limits. Craniocervical junction is normal. Grade 1 anterolisthesis is present at C2-3. Chronic endplate changes are present at C3-4. The central canal is patent. Sinuses/Orbits: The paranasal sinuses and mastoid air cells are clear. Bilateral lens replacements are present. Globes and orbits are otherwise within normal limits. IMPRESSION: 1. Bilateral acute nonhemorrhagic occipital lobe infarcts, right greater than left. There is cortical involvement on the right measuring up to 17 mm. The left occipital infarct is contain to the white matter. 2. Progression of advanced diffuse cerebral atrophy and white matter disease. Electronically Signed   By: San Morelle M.D.   On: 01/01/2018 09:29   Mr Cervical Spine Wo Contrast  Result Date: 01/02/2018 CLINICAL DATA:  Back pain.  Myelopathy.  Metastatic lung cancer. EXAM: MRI CERVICAL, THORACIC SPINE WITHOUT CONTRAST TECHNIQUE: Multiplanar and multiecho pulse sequences of the cervical spine, to include the craniocervical junction and cervicothoracic junction, and thoracic spine, were obtained without intravenous contrast. COMPARISON:  CT chest December 30, 2017, MRI cervical spine May 08, 2011. FINDINGS: MRI CERVICAL SPINE FINDINGS ALIGNMENT: Broad reversed cervical lordosis.  No malalignment. VERTEBRAE/DISCS: Abnormal infiltrative low T1, bright STIR signal LEFT C5, C6 articular pillars and lateral vertebral bodies, and adjacent soft tissues. Intact vertebral bodies intact. Severe progressed disc height loss C3-4 through C6-7 with severe subacute discogenic endplate changes. Heterogeneously bright STIR signal C4-5 disc favoring mineralization. Moderate to severe C3-4 degenerative disc. 8 mm low signal lesion in clivus, potential metastasis. CORD:Multifocal spinal cord myelomalacia at sites of prior cord edema. No syrinx. POSTERIOR FOSSA, VERTEBRAL ARTERIES,  PARASPINAL TISSUES: No MR findings of ligamentous injury. Vertebral artery flow voids present. Multiple  paraspinous cystic masses at C7 and T1 measuring to 17 mm these may arrive from the spinous process. DISC LEVELS: C2-3: Uncovertebral hypertrophy. Severe RIGHT and moderate LEFT facet arthropathy. No canal stenosis. Moderate RIGHT neural foraminal narrowing. C3-4: Uncovertebral hypertrophy mild facet arthropathy. No canal stenosis. Mild neural foraminal narrowing. C4-5: Uncovertebral hypertrophy. Mild facet arthropathy. No canal stenosis. Moderate LEFT neural foraminal narrowing with soft tissue component. C5-6: Small broad-based disc osteophyte complex. Abnormal soft tissue effacing LEFT neural foramen. No canal stenosis. Moderate RIGHT neural foraminal narrowing. C6-7: Small broad-based disc osteophyte complex. No canal stenosis. Moderate to severe bilateral neural foraminal narrowing. C7-T1: Uncovertebral hypertrophy. No canal stenosis. Mild LEFT neural foraminal narrowing. MRI THORACIC SPINE FINDINGS ALIGNMENT: Maintenance of the thoracic kyphosis. No malalignment. VERTEBRAE/DISCS: Vertebral bodies are intact. Intervertebral discs morphology maintained with mild desiccation. Abnormal signal bilateral T12 ribs compatible with metastasis. Subcentimeter bright STIR signal RIGHT T5 facet. CORD: Thoracic spinal cord is normal morphology and signal characteristics to the level of the conus medullaris which terminates at. PREVERTEBRAL AND PARASPINAL SOFT TISSUES: Abnormal masses within the mediastinum and RIGHT lung consistent with tumor with small RIGHT pleural effusion. DISC LEVELS: No disc bulge, canal stenosis nor neural foraminal narrowing. IMPRESSION: MRI cervical spine: 1. LEFT C5 and C6 interosseous and paraspinal mass consistent with metastatic disease. Paraspinous nodules C7 and T1 concerning for metastatic disease. Potential clival metastasis. No pathologic fracture. Contrast enhanced sequences would  better characterize tumor. 2. Multi cervical spinal cord myelomalacia.  No syrinx. 3. Progressed degenerative change of the cervical spine. No canal stenosis. 4. Neural foraminal narrowing C2-3 through C6-7: Moderate to severe at C6-7. Tumor effacing LEFT C5-6 and to lesser extent LEFT C4-5 neural foramen. MRI thoracic spine: 1. Bilateral T12 rib osseous metastasis. Extensive mediastinal and RIGHT lung metastasis. RIGHT T5 facet metastasis without pathologic fracture. Contrast enhanced sequences may detected additional metastasis. 2. No canal stenosis or neural foraminal narrowing. Electronically Signed   By: Elon Alas M.D.   On: 01/02/2018 19:06   Mr Thoracic Spine Wo Contrast  Result Date: 01/02/2018 CLINICAL DATA:  Back pain.  Myelopathy.  Metastatic lung cancer. EXAM: MRI CERVICAL, THORACIC SPINE WITHOUT CONTRAST TECHNIQUE: Multiplanar and multiecho pulse sequences of the cervical spine, to include the craniocervical junction and cervicothoracic junction, and thoracic spine, were obtained without intravenous contrast. COMPARISON:  CT chest December 30, 2017, MRI cervical spine May 08, 2011. FINDINGS: MRI CERVICAL SPINE FINDINGS ALIGNMENT: Broad reversed cervical lordosis.  No malalignment. VERTEBRAE/DISCS: Abnormal infiltrative low T1, bright STIR signal LEFT C5, C6 articular pillars and lateral vertebral bodies, and adjacent soft tissues. Intact vertebral bodies intact. Severe progressed disc height loss C3-4 through C6-7 with severe subacute discogenic endplate changes. Heterogeneously bright STIR signal C4-5 disc favoring mineralization. Moderate to severe C3-4 degenerative disc. 8 mm low signal lesion in clivus, potential metastasis. CORD:Multifocal spinal cord myelomalacia at sites of prior cord edema. No syrinx. POSTERIOR FOSSA, VERTEBRAL ARTERIES, PARASPINAL TISSUES: No MR findings of ligamentous injury. Vertebral artery flow voids present. Multiple paraspinous cystic masses at C7 and T1  measuring to 17 mm these may arrive from the spinous process. DISC LEVELS: C2-3: Uncovertebral hypertrophy. Severe RIGHT and moderate LEFT facet arthropathy. No canal stenosis. Moderate RIGHT neural foraminal narrowing. C3-4: Uncovertebral hypertrophy mild facet arthropathy. No canal stenosis. Mild neural foraminal narrowing. C4-5: Uncovertebral hypertrophy. Mild facet arthropathy. No canal stenosis. Moderate LEFT neural foraminal narrowing with soft tissue component. C5-6: Small broad-based disc osteophyte complex. Abnormal soft tissue effacing LEFT neural foramen. No  canal stenosis. Moderate RIGHT neural foraminal narrowing. C6-7: Small broad-based disc osteophyte complex. No canal stenosis. Moderate to severe bilateral neural foraminal narrowing. C7-T1: Uncovertebral hypertrophy. No canal stenosis. Mild LEFT neural foraminal narrowing. MRI THORACIC SPINE FINDINGS ALIGNMENT: Maintenance of the thoracic kyphosis. No malalignment. VERTEBRAE/DISCS: Vertebral bodies are intact. Intervertebral discs morphology maintained with mild desiccation. Abnormal signal bilateral T12 ribs compatible with metastasis. Subcentimeter bright STIR signal RIGHT T5 facet. CORD: Thoracic spinal cord is normal morphology and signal characteristics to the level of the conus medullaris which terminates at. PREVERTEBRAL AND PARASPINAL SOFT TISSUES: Abnormal masses within the mediastinum and RIGHT lung consistent with tumor with small RIGHT pleural effusion. DISC LEVELS: No disc bulge, canal stenosis nor neural foraminal narrowing. IMPRESSION: MRI cervical spine: 1. LEFT C5 and C6 interosseous and paraspinal mass consistent with metastatic disease. Paraspinous nodules C7 and T1 concerning for metastatic disease. Potential clival metastasis. No pathologic fracture. Contrast enhanced sequences would better characterize tumor. 2. Multi cervical spinal cord myelomalacia.  No syrinx. 3. Progressed degenerative change of the cervical spine. No canal  stenosis. 4. Neural foraminal narrowing C2-3 through C6-7: Moderate to severe at C6-7. Tumor effacing LEFT C5-6 and to lesser extent LEFT C4-5 neural foramen. MRI thoracic spine: 1. Bilateral T12 rib osseous metastasis. Extensive mediastinal and RIGHT lung metastasis. RIGHT T5 facet metastasis without pathologic fracture. Contrast enhanced sequences may detected additional metastasis. 2. No canal stenosis or neural foraminal narrowing. Electronically Signed   By: Elon Alas M.D.   On: 01/02/2018 19:06   Ir US Guide Bx Asp/drain  Result Date: 01/01/2018 INDICATION: 84 year old male with right upper lobe mass and omental nodularity concerning for metastatic lung cancer. He presents for ultrasound-guided biopsy of the largest left upper quadrant omental mass. EXAM: Ultrasound-guided biopsy, soft tissue MEDICATIONS: None. ANESTHESIA/SEDATION: A total is 0.5 mg Versed administered intravenously. FLUOROSCOPY TIME:  Fluoroscopy Time: 0 minutes 0 seconds (0 mGy). COMPLICATIONS: None immediate. PROCEDURE: Informed written consent was obtained from the patient after a thorough discussion of the procedural risks, benefits and alternatives. All questions were addressed. Maximal Sterile Barrier Technique was utilized including caps, mask, sterile gowns, sterile gloves, sterile drape, hand hygiene and skin antiseptic. A timeout was performed prior to the initiation of the procedure. The left upper quadrant was interrogated with ultrasound. A large heterogeneous soft tissue mass was successfully identified. A suitable skin entry site was selected and marked. Local anesthesia was attained by infiltration with 1% lidocaine. A small dermatotomy was made. Under real-time ultrasound guidance, an 18 gauge introducer needle was advanced into the margin of the mass. Several core biopsies were then obtained coaxially using the bio Pince automated biopsy device. Biopsy specimens were placed in saline and delivered to pathology  for further analysis. IMPRESSION: Ultrasound-guided core biopsy of left upper quadrant omental mass. Electronically Signed   By: Jacqulynn Cadet M.D.   On: 01/01/2018 16:18   Dg Swallowing Func-speech Pathology  Result Date: 01/02/2018 Objective Swallowing Evaluation: Type of Study: MBS-Modified Barium Swallow Study  Patient Details Name: Jacarie Pate MRN: [id number] Date of Birth: July 05, 1931 Today's Date: 01/02/2018 Time: SLP Start Time (ACUTE ONLY): 0947 -SLP Stop Time (ACUTE ONLY): 1000 SLP Time Calculation (min) (ACUTE ONLY): 13 min Past Medical History: Past Medical History: Diagnosis Date . Anemia  . Arthritis   "right hip; right knee; lower back ~ 1/2 way across" . Atrial fibrillation ([place])   remotely  . Atrial flutter ([place])   typical appearing by ekg. s/p ablation 07/28/11 . Diverticulosis  .  Dysphagia   with solids . Esophageal stricture  . GERD (gastroesophageal reflux disease)  . H/O hiatal hernia  . Heart murmur  . Hemorrhoids  . High cholesterol  . Hypertension  . Osteopenia  . Seizures ([place])   "back w/migraine headaches; 1990's or before" . Sinus bradycardia   asymptomatic . SSS (sick sinus syndrome) ([place])  . Stroke [facility]) 2012  "left me weaker on left side; balance is not good" Past Surgical History: Past Surgical History: Procedure Laterality Date . ASD REPAIR  1972  Patch repair . ATRIAL FLUTTER ABLATION N/A 07/28/2011  Procedure: ATRIAL FLUTTER ABLATION;  Surgeon: Thompson Grayer, MD;  Location: [facility] CATH LAB;  Service: Cardiovascular;  Laterality: N/A; . CARDIAC ELECTROPHYSIOLOGY STUDY AND ABLATION  07/28/11 . CARDIOVASCULAR STRESS TEST  03/07/2010  Perfusion defect in the inferior myocardial region is consistent with diaphragmatic attenuation. No ischemia or infarct/scar is seen in the remaining myocardium. No ECG changes. . CAROTID DOPPLER  06/26/2011  Bilateral ICAs-demonstrated normal patency without eivdence of significant diameter reduction, dissection, or any other vascular abnormality. . INGUINAL  HERNIA REPAIR  2011  right . IR US GUIDE BX ASP/DRAIN  01/01/2018 . JOINT REPLACEMENT    right knee replacement . KNEE ARTHROSCOPY    right; "maybe twice" . TOTAL KNEE ARTHROPLASTY  07/10/2012  Procedure: TOTAL KNEE ARTHROPLASTY;  Surgeon: Tobi Bastos, MD;  Location: [facility] ORS;  Service: Orthopedics;  Laterality: Right; . TRANSTHORACIC ECHOCARDIOGRAM  12/10/2012  EF 55-60%. No regional wall motion abnormalities. LA moderately dialted.Mild-moderate regurg of the tricuspid valve. HPI: Pt is an 84 y.o. male who presents with complaints of cough, weakness, shortness of breath and fever for approximately 1 week. 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 Pt had an acute episode of choking on water and slurred speech with MRI positive for bilateral acute occipital lobe infarcts, R > L. BSE in February 2014 with a functional appearing oropharyngeal swallow; regular diet, thin liquids recommended. PMH: GERD, esophagel stricture, dilation, dysphagia to solids, stroke, seizures, HTN, HH, afib, chronic back pain  Subjective: pt alert, cooperative, not a clear historian Assessment / Plan / Recommendation CHL IP CLINICAL IMPRESSIONS 01/02/2018 Clinical Impression Pt has a moderate oropharyngeal dysphagia that may be an acute exacerbation of a likely chronic dysphagia given structural component. Orally he has weak lingual manipulation, tongue pumping, and slow transit. Rather than containing cohesive boluses he allows liquids/solids to spill posteriorly into  the pharynx as his tongue is pumping, which contributes partially to his multiple swallows. Pt does also have large suspected osteophytes (MD not present to confirm), most prominent at C4-5, C5-6, that impacts bolus flow and epiglottic deflection. He has moderate vallecular residue, which may also contribute to his multiple swallows in an attempt to reduce residue. He has aspiration of thin liquids that occurs repeatedly as he  continues to swallow a bolus. He says he cannot tuck his chin because he cannot move his neck that way. Limiting bolus size to teaspoon helps to better contain the thin liquids above the laryngeal vestibule, preventing further penetration/aspiration. Pt has better airway protection with thicker substances, but his residue increases as well. Once the vallecula is full from soft solids, nectar thick liquids start to penetrate into the vestibule. Recommend Dys 1 diet and nectar thick liquids. SLP will f/u to determine if water via provale cup may be appropriate in between meals. Pt would benefit from SLP f/u for utilization of strategies, strengthening to maximize function in light  of structural component. SLP Visit Diagnosis Dysphagia, oropharyngeal phase (R13.12) Attention and concentration deficit following -- Frontal lobe and executive function deficit following -- Impact on safety and function Mild aspiration risk;Moderate aspiration risk   CHL IP TREATMENT RECOMMENDATION 01/02/2018 Treatment Recommendations Therapy as outlined in treatment plan below   Prognosis 01/02/2018 Prognosis for Safe Diet Advancement Fair Barriers to Reach Goals Other (Comment) Barriers/Prognosis Comment -- CHL IP DIET RECOMMENDATION 01/02/2018 SLP Diet Recommendations Dysphagia 1 (Puree) solids;Nectar thick liquid Liquid Administration via Cup;No straw Medication Administration Crushed with puree Compensations Slow rate;Small sips/bites;Follow solids with liquid Postural Changes Remain semi-upright after after feeds/meals (Comment);Seated upright at 90 degrees   CHL IP OTHER RECOMMENDATIONS 01/02/2018 Recommended Consults -- Oral Care Recommendations Oral care BID Other Recommendations --   CHL IP FOLLOW UP RECOMMENDATIONS 01/02/2018 Follow up Recommendations (No Data)   CHL IP FREQUENCY AND DURATION 01/02/2018 Speech Therapy Frequency (ACUTE ONLY) min 2x/week Treatment Duration 2 weeks      CHL IP ORAL PHASE 01/02/2018 Oral Phase Impaired Oral  - Pudding Teaspoon -- Oral - Pudding Cup -- Oral - Honey Teaspoon -- Oral - Honey Cup -- Oral - Nectar Teaspoon -- Oral - Nectar Cup Weak lingual manipulation;Lingual pumping;Reduced posterior propulsion;Piecemeal swallowing;Delayed oral transit;Decreased bolus cohesion Oral - Nectar Straw -- Oral - Thin Teaspoon Weak lingual manipulation;Lingual pumping;Reduced posterior propulsion;Piecemeal swallowing;Delayed oral transit;Decreased bolus cohesion Oral - Thin Cup Weak lingual manipulation;Lingual pumping;Reduced posterior propulsion;Piecemeal swallowing;Delayed oral transit;Decreased bolus cohesion Oral - Thin Straw -- Oral - Puree Weak lingual manipulation;Lingual pumping;Reduced posterior propulsion;Piecemeal swallowing;Delayed oral transit;Decreased bolus cohesion Oral - Mech Soft Weak lingual manipulation;Lingual pumping;Reduced posterior propulsion;Piecemeal swallowing;Delayed oral transit;Decreased bolus cohesion;Impaired mastication Oral - Regular -- Oral - Multi-Consistency -- Oral - Pill -- Oral Phase - Comment --  CHL IP PHARYNGEAL PHASE 01/02/2018 Pharyngeal Phase Impaired Pharyngeal- Pudding Teaspoon -- Pharyngeal -- Pharyngeal- Pudding Cup -- Pharyngeal -- Pharyngeal- Honey Teaspoon -- Pharyngeal -- Pharyngeal- Honey Cup -- Pharyngeal -- Pharyngeal- Nectar Teaspoon -- Pharyngeal -- Pharyngeal- Nectar Cup Delayed swallow initiation-pyriform sinuses;Reduced epiglottic inversion;Reduced airway/laryngeal closure;Reduced tongue base retraction;Pharyngeal residue - valleculae;Penetration/Aspiration during swallow Pharyngeal Material enters airway, remains ABOVE vocal cords then ejected out Pharyngeal- Nectar Straw -- Pharyngeal -- Pharyngeal- Thin Teaspoon Delayed swallow initiation-pyriform sinuses;Reduced epiglottic inversion;Reduced airway/laryngeal closure;Reduced tongue base retraction;Pharyngeal residue - valleculae Pharyngeal -- Pharyngeal- Thin Cup Delayed swallow initiation-pyriform sinuses;Reduced  epiglottic inversion;Reduced airway/laryngeal closure;Reduced tongue base retraction;Pharyngeal residue - valleculae;Penetration/Aspiration during swallow;Penetration/Apiration after swallow Pharyngeal Material enters airway, passes BELOW cords and not ejected out despite cough attempt by patient Pharyngeal- Thin Straw -- Pharyngeal -- Pharyngeal- Puree Delayed swallow initiation-pyriform sinuses;Reduced epiglottic inversion;Reduced airway/laryngeal closure;Reduced tongue base retraction;Pharyngeal residue - valleculae Pharyngeal -- Pharyngeal- Mechanical Soft Delayed swallow initiation-pyriform sinuses;Reduced epiglottic inversion;Reduced airway/laryngeal closure;Reduced tongue base retraction;Pharyngeal residue - valleculae Pharyngeal -- Pharyngeal- Regular -- Pharyngeal -- Pharyngeal- Multi-consistency -- Pharyngeal -- Pharyngeal- Pill -- Pharyngeal -- Pharyngeal Comment --  CHL IP CERVICAL ESOPHAGEAL PHASE 01/02/2018 Cervical Esophageal Phase WFL Pudding Teaspoon -- Pudding Cup -- Honey Teaspoon -- Honey Cup -- Nectar Teaspoon -- Nectar Cup -- Nectar Straw -- Thin Teaspoon -- Thin Cup -- Thin Straw -- Puree -- Mechanical Soft -- Regular -- Multi-consistency -- Pill -- Cervical Esophageal Comment -- No flowsheet data found. Germain Osgood 01/02/2018, 12:47 PM  Germain Osgood, M.A. CCC-SLP [phone]                  Scheduled Meds: . atorvastatin  20 mg Oral q1800  . [START ON 01/04/2018] azithromycin  500 mg Oral Daily  . ferrous sulfate  325 mg Oral BID PC  . finasteride  5 mg Oral Daily  . multivitamin with minerals  1 tablet Oral Daily  . phenytoin  100 mg Oral TID  . sodium chloride flush  3 mL Intravenous Q12H  . Warfarin - Pharmacist Dosing Inpatient   Does not apply q1800   Continuous Infusions: . sodium chloride 75 mL/hr at 01/02/18 2319  . sodium chloride    . piperacillin-tazobactam (ZOSYN)  IV 3.375 g (01/03/18 0851)  . vancomycin 1,000 mg (01/02/18 1854)     LOS: 4 days      Time spent: 35 minutes.     Elmarie Shiley, MD [facility] Pager [phone]  If 7PM-7AM, please contact night-coverage [email or web address] Password [email or web address] 01/03/2018, 9:09 AM

## 2018-01-03 NOTE — Consult Note (Signed)
            [facility] CM Primary Care Navigator  01/03/2018  Jay Walters 12/18/1931 [id number]   Went to see patient at the bedside to identify possible discharge needs but he was only oriented to self, slow to respond and still with slurred speech and quite difficult to understand.  Call made and spoke to patient's wife Jay Walters) who reports that patient had "shortness of breath" that had led to this admission.(Bilateral acute non-hemorrhagic occipital lobe infarct, right upper lobe lung mass- awaiting biopsy result; Post obstructive Pneumonia)  Patient's wifeendorsesDr. Stanton Kidney Baxley with [facility] MD office as theprimary care provider.   [address]/ [place] and [facility] serviceto obtain medications without difficulty.  [facility]" [facility] filled once a week.  Patient's wife has beenprovidingtransportation to his doctors' appointments and states rescheduling appointments if conflicts arise with her work.  Wife is the primary caregiver for him at home and states that she is still working.  Anticipated discharge plan isstillto be determinedpending biopsy result and improvement, according to wife. Patient's wife voiced concern of not being able to provide adequate care for patient at home since she works. She is hoping that patient will be discharge to a rehab facility from here. Still awaiting PT evaluation and recommendation.  Patient's wifevoiced understanding to call primary care provider's officewhen patient returnsbackhomefor a post discharge follow-upvisitwithin1- 2 weeksor sooner if needs arise. Patient letter (with PCP's contact number) was provided at the bedside as a reminder- wife is aware.  Discussed with patient's wiferegarding [facility] CM services available for health management andresourcesat home, and she was  interested about it,butverbalized being unsure with discharge plans for now. Patient's wife indicated that she has been assisting patient in managing his health needs at home.   Wife expressedunderstanding of needto seekreferral from primary care provider to [facility] care management if deemednecessaryand appropriate for anyservices in thenearfuture- once patient is discharge back home.  [facility] care management information was provided for future needs thatpatientmay have.    For additional questions please contact:  Edwena Felty A. Arianni Gallego, BSN, RN-BC [facility] PRIMARY CARE Navigator Cell: [phone]

## 2018-01-03 NOTE — Progress Notes (Signed)
Oncology Nurse Navigator Documentation  Oncology Nurse Navigator Flowsheets 01/03/2018  Navigator Location [facility]  Navigator Encounter Type Other/per Dr. Benay Spice EGFR testing ordered  Barriers/Navigation Needs Coordination of Care  Interventions Coordination of Care  Coordination of Care Other  Acuity Level 2  Time Spent with Patient 30

## 2018-01-03 NOTE — Progress Notes (Signed)
SLP Cancellation Note  Patient Details Name: Jay Walters MRN: [id number] DOB: 24-Nov-1931   Cancelled treatment:       Reason Eval/Treat Not Completed: Patient at procedure or test/unavailable - off the floor for MRI. Will f/u as able. RN reports limited intake at breakfast, some oral holding with solids, no coughing with liquids.    Germain Osgood 01/03/2018, 11:12 AM  Germain Osgood, M.A. CCC-SLP [phone]

## 2018-01-03 NOTE — Progress Notes (Signed)
STROKE TEAM PROGRESS NOTE     INTERVAL HISTORY His RN is at the bedside.  Patient  Just returned from MRI and had to be sedated with Ativan. MRI with contrast shows metastatic lesions the paraspinal cervical spine as well as the clivus as well as the L2 vertebral body. There does not appear to be any significant cord compression. There is degenerative lumbar spine disease with severe at L4-5 bilateral foraminal narrowing. There is no family at the bedside  Vitals:   01/02/18 0743 01/02/18 2304 01/03/18 0500 01/03/18 0741  BP: 115/65 (!) 105/49  (!) 102/53  Pulse: (!) 59 64  81  Resp: 20 18  17   Temp: 97.7 F (36.5 C) 97.9 F (36.6 C)  98.1 F (36.7 C)  TempSrc: Oral Oral  Oral  SpO2: 96% 97%  94%  Weight:   145 lb 4.5 oz (65.9 kg)   Height:        CBC:  Recent Labs  Lab 12/30/17 0659  01/02/18 0318 01/03/18 1204  WBC 22.9*   < > 29.1* 30.7*  NEUTROABS 18.0*  --   --   --   HGB 13.2   < > 12.4* 11.7*  HCT 41.7   < > 39.7 37.0*  MCV 96.5   < > 98.3 96.4  PLT 287   < > 194 151   < > = values in this interval not displayed.    Basic Metabolic Panel:  Recent Labs  Lab 01/02/18 0318 01/03/18 1204  NA 148* 146*  K 4.2 4.0  CL 119* 119*  CO2 20* 20*  GLUCOSE 113* 116*  BUN 29* 31*  CREATININE 1.09 1.34*  CALCIUM 8.3* 8.1*   Lipid Panel:     Component Value Date/Time   CHOL 93 01/03/2018 0247   CHOL 171 07/17/2017 1622   TRIG 165 (H) 01/03/2018 0247   HDL 25 (L) 01/03/2018 0247   HDL 50 07/17/2017 1622   CHOLHDL 3.7 01/03/2018 0247   VLDL 33 01/03/2018 0247   LDLCALC 35 01/03/2018 0247   LDLCALC 105 (H) 07/17/2017 1622   HgbA1c:  Lab Results  Component Value Date   HGBA1C 5.8 (H) 01/03/2018   Urine Drug Screen: No results found for: LABOPIA, COCAINSCRNUR, LABBENZ, AMPHETMU, THCU, LABBARB  Alcohol Level No results found for: [facility], [place]  IMAGING Mr Cervical Spine Wo Contrast  Result Date: 01/02/2018 CLINICAL DATA:  Back pain.  Myelopathy.  Metastatic lung  cancer. EXAM: MRI CERVICAL, THORACIC SPINE WITHOUT CONTRAST TECHNIQUE: Multiplanar and multiecho pulse sequences of the cervical spine, to include the craniocervical junction and cervicothoracic junction, and thoracic spine, were obtained without intravenous contrast. COMPARISON:  CT chest December 30, 2017, MRI cervical spine May 08, 2011. FINDINGS: MRI CERVICAL SPINE FINDINGS ALIGNMENT: Broad reversed cervical lordosis.  No malalignment. VERTEBRAE/DISCS: Abnormal infiltrative low T1, bright STIR signal LEFT C5, C6 articular pillars and lateral vertebral bodies, and adjacent soft tissues. Intact vertebral bodies intact. Severe progressed disc height loss C3-4 through C6-7 with severe subacute discogenic endplate changes. Heterogeneously bright STIR signal C4-5 disc favoring mineralization. Moderate to severe C3-4 degenerative disc. 8 mm low signal lesion in clivus, potential metastasis. CORD:Multifocal spinal cord myelomalacia at sites of prior cord edema. No syrinx. POSTERIOR FOSSA, VERTEBRAL ARTERIES, PARASPINAL TISSUES: No MR findings of ligamentous injury. Vertebral artery flow voids present. Multiple paraspinous cystic masses at C7 and T1 measuring to 17 mm these may arrive from the spinous process. DISC LEVELS: C2-3: Uncovertebral hypertrophy. Severe RIGHT and moderate LEFT facet  arthropathy. No canal stenosis. Moderate RIGHT neural foraminal narrowing. C3-4: Uncovertebral hypertrophy mild facet arthropathy. No canal stenosis. Mild neural foraminal narrowing. C4-5: Uncovertebral hypertrophy. Mild facet arthropathy. No canal stenosis. Moderate LEFT neural foraminal narrowing with soft tissue component. C5-6: Small broad-based disc osteophyte complex. Abnormal soft tissue effacing LEFT neural foramen. No canal stenosis. Moderate RIGHT neural foraminal narrowing. C6-7: Small broad-based disc osteophyte complex. No canal stenosis. Moderate to severe bilateral neural foraminal narrowing. C7-T1: Uncovertebral  hypertrophy. No canal stenosis. Mild LEFT neural foraminal narrowing. MRI THORACIC SPINE FINDINGS ALIGNMENT: Maintenance of the thoracic kyphosis. No malalignment. VERTEBRAE/DISCS: Vertebral bodies are intact. Intervertebral discs morphology maintained with mild desiccation. Abnormal signal bilateral T12 ribs compatible with metastasis. Subcentimeter bright STIR signal RIGHT T5 facet. CORD: Thoracic spinal cord is normal morphology and signal characteristics to the level of the conus medullaris which terminates at. PREVERTEBRAL AND PARASPINAL SOFT TISSUES: Abnormal masses within the mediastinum and RIGHT lung consistent with tumor with small RIGHT pleural effusion. DISC LEVELS: No disc bulge, canal stenosis nor neural foraminal narrowing. IMPRESSION: MRI cervical spine: 1. LEFT C5 and C6 interosseous and paraspinal mass consistent with metastatic disease. Paraspinous nodules C7 and T1 concerning for metastatic disease. Potential clival metastasis. No pathologic fracture. Contrast enhanced sequences would better characterize tumor. 2. Multi cervical spinal cord myelomalacia.  No syrinx. 3. Progressed degenerative change of the cervical spine. No canal stenosis. 4. Neural foraminal narrowing C2-3 through C6-7: Moderate to severe at C6-7. Tumor effacing LEFT C5-6 and to lesser extent LEFT C4-5 neural foramen. MRI thoracic spine: 1. Bilateral T12 rib osseous metastasis. Extensive mediastinal and RIGHT lung metastasis. RIGHT T5 facet metastasis without pathologic fracture. Contrast enhanced sequences may detected additional metastasis. 2. No canal stenosis or neural foraminal narrowing. Electronically Signed   By: Elon Alas M.D.   On: 01/02/2018 19:06   Mr Cervical Spine W Contrast  Result Date: 01/03/2018 CLINICAL DATA:  84 year old male with large right lung mass discovered four days ago. Slurred speech and worsening lower extremity weakness. Lower extremity symptoms not explained by small posterior  circulation infarcts seen on MRI 01/01/2018. Spinal and paraspinal metastases in the cervical and thoracic spine demonstrated yesterday without IV contrast. EXAM: MRI CERVICAL SPINE WITH CONTRAST MRI THORACIC SPINE WITH CONTRAST MRI LUMBAR SPINE WITHOUT AND WITH CONTRAST TECHNIQUE: Multisequence MR imaging of the spine from the cervical spine to the sacrum was performed prior to and following IV contrast administration for evaluation of spinal metastatic disease. CONTRAST:  58mL MULTIHANCE GADOBENATE DIMEGLUMINE 529 MG/ML IV SOLN COMPARISON:  Noncontrast cervical and thoracic MRI 01/02/2018. Noncontrast brain MRI 01/01/2018. Chest CT 12/30/2017. FINDINGS: MRI CERVICAL SPINE FINDINGS Alignment: Stable since yesterday with mild reversal of cervical lordosis. Vertebrae: Left vertebral enhancing metastasis with epidural and paraspinal extraosseous extension re-demonstrated at the C5-C6 level on the left. As seen yesterday, the tumor is approximately 3 centimeters in diameter centered at the left C5 pedicle and C6 neural foramen. There is epidural extension of tumor into the left lateral spinal canal at C5 (series 19, image 18). This abuts the lateral left spinal cord without mass effect on the cord. The left C5 and C6 neural foramina are completely invaded by tumor. No other cervical vertebral tumor identified. Cord: No abnormal spinal cord enhancement. Abnormal left lateral epidural space at C5, but no dural thickening. Posterior Fossa, vertebral arteries, paraspinal tissues: There is a 17 millimeter round posterior right paraspinal soft tissue metastasis abutting the C7 spinous process (series 18, image 7 and series 19, image  31. The small clivus lesion is enhancing and highly suspicious for small metastasis. No abnormal enhancement identified in the posterior fossa. Disc levels: Stable since yesterday, no cervical spinal stenosis. MRI THORACIC SPINE FINDINGS Segmentation: Absent ribs at T12. This is the same  numbering system used yesterday. Alignment:  Stable.  Moderate dextroconvex scoliosis. Vertebrae: The ribs at T12 are absent, not involved by tumor or metastasis. No thoracic vertebral tumor is identified. The right lung mass closely abuts the right thoracic spine but there is no foraminal or vertebral invasion identified. Cord: On the sagittal post-contrast images today there is speckled artifact projecting over the T8 and T9 spinal levels. No abnormal spinal cord enhancement. No thoracic dural thickening or enhancement. Paraspinal and other soft tissues: Stable since yesterday. Disc levels: Stable since yesterday, no thoracic spinal stenosis. MRI LUMBAR SPINE FINDINGS Segmentation:  Normal, confirming absent ribs at T12. Alignment: Preserved lumbar lordosis. Superimposed dextroconvex upper lumbar curvature. Vertebrae: There is a 2 centimeter round enhancing metastasis in the right superior L2 vertebral body (series 5, image 6). No extraosseous extension. No other lumbar metastasis identified. No central sacral metastasis. Possible small metastasis partially visible in the medial right iliac bone on series 8, image 34. Conus medullaris: Extends to the T11-T12 level and was best demonstrated on the thoracic MRI yesterday. The cauda equina nerve roots are non thickened and there is no abnormal lumbar intradural enhancement. No dural thickening. Paraspinal and other soft tissues: There is abnormal prevertebral and mesenteric soft tissue nodules with enhancement in the visible abdomen suggesting widespread metastatic lymphadenopathy or mesenteric tumor. Some of the largest lesions are 15-17 millimeter short axis (series 7, image 25 and 27). Superimposed small volume of pelvic free fluid (series 6, image 1). Negative visualized posterior paraspinal soft tissues. Disc levels: Lumbar spine degeneration, worst at L4-L5 and eccentric to the right. No associated spinal stenosis. There is severe right L4 foraminal stenosis.  IMPRESSION: 1. No spinal cord or cauda equina metastasis. No spinal cord or cauda equina stenosis/compression. But there is severe degenerative right L4 neural foraminal stenosis. 2. No thoracic spine metastasis (absent ribs at T12). Isolated small lumbar metastasis in the L2 vertebral body. 3. Re-demonstrated left C5 vertebral and paraspinal metastasis with infiltration of the left C5 and C6 neural foramina. There is left lateral epidural tumor at the C5 spinal cord level, but no cord compression or enhancement. 4. Extensive soft tissue nodularity in the visible lower abdominal mesentery and lumbar prevertebral space compatible with disseminated soft tissue or lymph node metastases. Associated small volume lower abdominal and pelvic free fluid. 5. Small posterior paraspinal soft tissue metastasis abutting the C7 spinous process. Small clivus metastasis suspected. Electronically Signed   By: Genevie Ann M.D.   On: 01/03/2018 12:12   Mr Thoracic Spine Wo Contrast  Result Date: 01/02/2018 CLINICAL DATA:  Back pain.  Myelopathy.  Metastatic lung cancer. EXAM: MRI CERVICAL, THORACIC SPINE WITHOUT CONTRAST TECHNIQUE: Multiplanar and multiecho pulse sequences of the cervical spine, to include the craniocervical junction and cervicothoracic junction, and thoracic spine, were obtained without intravenous contrast. COMPARISON:  CT chest December 30, 2017, MRI cervical spine May 08, 2011. FINDINGS: MRI CERVICAL SPINE FINDINGS ALIGNMENT: Broad reversed cervical lordosis.  No malalignment. VERTEBRAE/DISCS: Abnormal infiltrative low T1, bright STIR signal LEFT C5, C6 articular pillars and lateral vertebral bodies, and adjacent soft tissues. Intact vertebral bodies intact. Severe progressed disc height loss C3-4 through C6-7 with severe subacute discogenic endplate changes. Heterogeneously bright STIR signal C4-5 disc favoring mineralization.  Moderate to severe C3-4 degenerative disc. 8 mm low signal lesion in clivus, potential  metastasis. CORD:Multifocal spinal cord myelomalacia at sites of prior cord edema. No syrinx. POSTERIOR FOSSA, VERTEBRAL ARTERIES, PARASPINAL TISSUES: No MR findings of ligamentous injury. Vertebral artery flow voids present. Multiple paraspinous cystic masses at C7 and T1 measuring to 17 mm these may arrive from the spinous process. DISC LEVELS: C2-3: Uncovertebral hypertrophy. Severe RIGHT and moderate LEFT facet arthropathy. No canal stenosis. Moderate RIGHT neural foraminal narrowing. C3-4: Uncovertebral hypertrophy mild facet arthropathy. No canal stenosis. Mild neural foraminal narrowing. C4-5: Uncovertebral hypertrophy. Mild facet arthropathy. No canal stenosis. Moderate LEFT neural foraminal narrowing with soft tissue component. C5-6: Small broad-based disc osteophyte complex. Abnormal soft tissue effacing LEFT neural foramen. No canal stenosis. Moderate RIGHT neural foraminal narrowing. C6-7: Small broad-based disc osteophyte complex. No canal stenosis. Moderate to severe bilateral neural foraminal narrowing. C7-T1: Uncovertebral hypertrophy. No canal stenosis. Mild LEFT neural foraminal narrowing. MRI THORACIC SPINE FINDINGS ALIGNMENT: Maintenance of the thoracic kyphosis. No malalignment. VERTEBRAE/DISCS: Vertebral bodies are intact. Intervertebral discs morphology maintained with mild desiccation. Abnormal signal bilateral T12 ribs compatible with metastasis. Subcentimeter bright STIR signal RIGHT T5 facet. CORD: Thoracic spinal cord is normal morphology and signal characteristics to the level of the conus medullaris which terminates at. PREVERTEBRAL AND PARASPINAL SOFT TISSUES: Abnormal masses within the mediastinum and RIGHT lung consistent with tumor with small RIGHT pleural effusion. DISC LEVELS: No disc bulge, canal stenosis nor neural foraminal narrowing. IMPRESSION: MRI cervical spine: 1. LEFT C5 and C6 interosseous and paraspinal mass consistent with metastatic disease. Paraspinous nodules C7 and  T1 concerning for metastatic disease. Potential clival metastasis. No pathologic fracture. Contrast enhanced sequences would better characterize tumor. 2. Multi cervical spinal cord myelomalacia.  No syrinx. 3. Progressed degenerative change of the cervical spine. No canal stenosis. 4. Neural foraminal narrowing C2-3 through C6-7: Moderate to severe at C6-7. Tumor effacing LEFT C5-6 and to lesser extent LEFT C4-5 neural foramen. MRI thoracic spine: 1. Bilateral T12 rib osseous metastasis. Extensive mediastinal and RIGHT lung metastasis. RIGHT T5 facet metastasis without pathologic fracture. Contrast enhanced sequences may detected additional metastasis. 2. No canal stenosis or neural foraminal narrowing. Electronically Signed   By: Elon Alas M.D.   On: 01/02/2018 19:06   Mr Thoracic Spine W Contrast  Result Date: 01/03/2018 CLINICAL DATA:  84 year old male with large right lung mass discovered four days ago. Slurred speech and worsening lower extremity weakness. Lower extremity symptoms not explained by small posterior circulation infarcts seen on MRI 01/01/2018. Spinal and paraspinal metastases in the cervical and thoracic spine demonstrated yesterday without IV contrast. EXAM: MRI CERVICAL SPINE WITH CONTRAST MRI THORACIC SPINE WITH CONTRAST MRI LUMBAR SPINE WITHOUT AND WITH CONTRAST TECHNIQUE: Multisequence MR imaging of the spine from the cervical spine to the sacrum was performed prior to and following IV contrast administration for evaluation of spinal metastatic disease. CONTRAST:  77mL MULTIHANCE GADOBENATE DIMEGLUMINE 529 MG/ML IV SOLN COMPARISON:  Noncontrast cervical and thoracic MRI 01/02/2018. Noncontrast brain MRI 01/01/2018. Chest CT 12/30/2017. FINDINGS: MRI CERVICAL SPINE FINDINGS Alignment: Stable since yesterday with mild reversal of cervical lordosis. Vertebrae: Left vertebral enhancing metastasis with epidural and paraspinal extraosseous extension re-demonstrated at the C5-C6 level  on the left. As seen yesterday, the tumor is approximately 3 centimeters in diameter centered at the left C5 pedicle and C6 neural foramen. There is epidural extension of tumor into the left lateral spinal canal at C5 (series 19, image 18). This abuts the lateral  left spinal cord without mass effect on the cord. The left C5 and C6 neural foramina are completely invaded by tumor. No other cervical vertebral tumor identified. Cord: No abnormal spinal cord enhancement. Abnormal left lateral epidural space at C5, but no dural thickening. Posterior Fossa, vertebral arteries, paraspinal tissues: There is a 17 millimeter round posterior right paraspinal soft tissue metastasis abutting the C7 spinous process (series 18, image 7 and series 19, image 31. The small clivus lesion is enhancing and highly suspicious for small metastasis. No abnormal enhancement identified in the posterior fossa. Disc levels: Stable since yesterday, no cervical spinal stenosis. MRI THORACIC SPINE FINDINGS Segmentation: Absent ribs at T12. This is the same numbering system used yesterday. Alignment:  Stable.  Moderate dextroconvex scoliosis. Vertebrae: The ribs at T12 are absent, not involved by tumor or metastasis. No thoracic vertebral tumor is identified. The right lung mass closely abuts the right thoracic spine but there is no foraminal or vertebral invasion identified. Cord: On the sagittal post-contrast images today there is speckled artifact projecting over the T8 and T9 spinal levels. No abnormal spinal cord enhancement. No thoracic dural thickening or enhancement. Paraspinal and other soft tissues: Stable since yesterday. Disc levels: Stable since yesterday, no thoracic spinal stenosis. MRI LUMBAR SPINE FINDINGS Segmentation:  Normal, confirming absent ribs at T12. Alignment: Preserved lumbar lordosis. Superimposed dextroconvex upper lumbar curvature. Vertebrae: There is a 2 centimeter round enhancing metastasis in the right superior L2  vertebral body (series 5, image 6). No extraosseous extension. No other lumbar metastasis identified. No central sacral metastasis. Possible small metastasis partially visible in the medial right iliac bone on series 8, image 34. Conus medullaris: Extends to the T11-T12 level and was best demonstrated on the thoracic MRI yesterday. The cauda equina nerve roots are non thickened and there is no abnormal lumbar intradural enhancement. No dural thickening. Paraspinal and other soft tissues: There is abnormal prevertebral and mesenteric soft tissue nodules with enhancement in the visible abdomen suggesting widespread metastatic lymphadenopathy or mesenteric tumor. Some of the largest lesions are 15-17 millimeter short axis (series 7, image 25 and 27). Superimposed small volume of pelvic free fluid (series 6, image 1). Negative visualized posterior paraspinal soft tissues. Disc levels: Lumbar spine degeneration, worst at L4-L5 and eccentric to the right. No associated spinal stenosis. There is severe right L4 foraminal stenosis. IMPRESSION: 1. No spinal cord or cauda equina metastasis. No spinal cord or cauda equina stenosis/compression. But there is severe degenerative right L4 neural foraminal stenosis. 2. No thoracic spine metastasis (absent ribs at T12). Isolated small lumbar metastasis in the L2 vertebral body. 3. Re-demonstrated left C5 vertebral and paraspinal metastasis with infiltration of the left C5 and C6 neural foramina. There is left lateral epidural tumor at the C5 spinal cord level, but no cord compression or enhancement. 4. Extensive soft tissue nodularity in the visible lower abdominal mesentery and lumbar prevertebral space compatible with disseminated soft tissue or lymph node metastases. Associated small volume lower abdominal and pelvic free fluid. 5. Small posterior paraspinal soft tissue metastasis abutting the C7 spinous process. Small clivus metastasis suspected. Electronically Signed   By: Genevie Ann M.D.   On: 01/03/2018 12:12   Mr Lumbar Spine W Wo Contrast  Result Date: 01/03/2018 CLINICAL DATA:  84 year old male with large right lung mass discovered four days ago. Slurred speech and worsening lower extremity weakness. Lower extremity symptoms not explained by small posterior circulation infarcts seen on MRI 01/01/2018. Spinal and paraspinal metastases in the  cervical and thoracic spine demonstrated yesterday without IV contrast. EXAM: MRI CERVICAL SPINE WITH CONTRAST MRI THORACIC SPINE WITH CONTRAST MRI LUMBAR SPINE WITHOUT AND WITH CONTRAST TECHNIQUE: Multisequence MR imaging of the spine from the cervical spine to the sacrum was performed prior to and following IV contrast administration for evaluation of spinal metastatic disease. CONTRAST:  70mL MULTIHANCE GADOBENATE DIMEGLUMINE 529 MG/ML IV SOLN COMPARISON:  Noncontrast cervical and thoracic MRI 01/02/2018. Noncontrast brain MRI 01/01/2018. Chest CT 12/30/2017. FINDINGS: MRI CERVICAL SPINE FINDINGS Alignment: Stable since yesterday with mild reversal of cervical lordosis. Vertebrae: Left vertebral enhancing metastasis with epidural and paraspinal extraosseous extension re-demonstrated at the C5-C6 level on the left. As seen yesterday, the tumor is approximately 3 centimeters in diameter centered at the left C5 pedicle and C6 neural foramen. There is epidural extension of tumor into the left lateral spinal canal at C5 (series 19, image 18). This abuts the lateral left spinal cord without mass effect on the cord. The left C5 and C6 neural foramina are completely invaded by tumor. No other cervical vertebral tumor identified. Cord: No abnormal spinal cord enhancement. Abnormal left lateral epidural space at C5, but no dural thickening. Posterior Fossa, vertebral arteries, paraspinal tissues: There is a 17 millimeter round posterior right paraspinal soft tissue metastasis abutting the C7 spinous process (series 18, image 7 and series 19, image 31.  The small clivus lesion is enhancing and highly suspicious for small metastasis. No abnormal enhancement identified in the posterior fossa. Disc levels: Stable since yesterday, no cervical spinal stenosis. MRI THORACIC SPINE FINDINGS Segmentation: Absent ribs at T12. This is the same numbering system used yesterday. Alignment:  Stable.  Moderate dextroconvex scoliosis. Vertebrae: The ribs at T12 are absent, not involved by tumor or metastasis. No thoracic vertebral tumor is identified. The right lung mass closely abuts the right thoracic spine but there is no foraminal or vertebral invasion identified. Cord: On the sagittal post-contrast images today there is speckled artifact projecting over the T8 and T9 spinal levels. No abnormal spinal cord enhancement. No thoracic dural thickening or enhancement. Paraspinal and other soft tissues: Stable since yesterday. Disc levels: Stable since yesterday, no thoracic spinal stenosis. MRI LUMBAR SPINE FINDINGS Segmentation:  Normal, confirming absent ribs at T12. Alignment: Preserved lumbar lordosis. Superimposed dextroconvex upper lumbar curvature. Vertebrae: There is a 2 centimeter round enhancing metastasis in the right superior L2 vertebral body (series 5, image 6). No extraosseous extension. No other lumbar metastasis identified. No central sacral metastasis. Possible small metastasis partially visible in the medial right iliac bone on series 8, image 34. Conus medullaris: Extends to the T11-T12 level and was best demonstrated on the thoracic MRI yesterday. The cauda equina nerve roots are non thickened and there is no abnormal lumbar intradural enhancement. No dural thickening. Paraspinal and other soft tissues: There is abnormal prevertebral and mesenteric soft tissue nodules with enhancement in the visible abdomen suggesting widespread metastatic lymphadenopathy or mesenteric tumor. Some of the largest lesions are 15-17 millimeter short axis (series 7, image 25 and  27). Superimposed small volume of pelvic free fluid (series 6, image 1). Negative visualized posterior paraspinal soft tissues. Disc levels: Lumbar spine degeneration, worst at L4-L5 and eccentric to the right. No associated spinal stenosis. There is severe right L4 foraminal stenosis. IMPRESSION: 1. No spinal cord or cauda equina metastasis. No spinal cord or cauda equina stenosis/compression. But there is severe degenerative right L4 neural foraminal stenosis. 2. No thoracic spine metastasis (absent ribs at T12). Isolated small lumbar  metastasis in the L2 vertebral body. 3. Re-demonstrated left C5 vertebral and paraspinal metastasis with infiltration of the left C5 and C6 neural foramina. There is left lateral epidural tumor at the C5 spinal cord level, but no cord compression or enhancement. 4. Extensive soft tissue nodularity in the visible lower abdominal mesentery and lumbar prevertebral space compatible with disseminated soft tissue or lymph node metastases. Associated small volume lower abdominal and pelvic free fluid. 5. Small posterior paraspinal soft tissue metastasis abutting the C7 spinous process. Small clivus metastasis suspected. Electronically Signed   By: Genevie Ann M.D.   On: 01/03/2018 12:12   Ir US Guide Bx Asp/drain  Result Date: 01/01/2018 INDICATION: 84 year old male with right upper lobe mass and omental nodularity concerning for metastatic lung cancer. He presents for ultrasound-guided biopsy of the largest left upper quadrant omental mass. EXAM: Ultrasound-guided biopsy, soft tissue MEDICATIONS: None. ANESTHESIA/SEDATION: A total is 0.5 mg Versed administered intravenously. FLUOROSCOPY TIME:  Fluoroscopy Time: 0 minutes 0 seconds (0 mGy). COMPLICATIONS: None immediate. PROCEDURE: Informed written consent was obtained from the patient after a thorough discussion of the procedural risks, benefits and alternatives. All questions were addressed. Maximal Sterile Barrier Technique was utilized  including caps, mask, sterile gowns, sterile gloves, sterile drape, hand hygiene and skin antiseptic. A timeout was performed prior to the initiation of the procedure. The left upper quadrant was interrogated with ultrasound. A large heterogeneous soft tissue mass was successfully identified. A suitable skin entry site was selected and marked. Local anesthesia was attained by infiltration with 1% lidocaine. A small dermatotomy was made. Under real-time ultrasound guidance, an 18 gauge introducer needle was advanced into the margin of the mass. Several core biopsies were then obtained coaxially using the bio Pince automated biopsy device. Biopsy specimens were placed in saline and delivered to pathology for further analysis. IMPRESSION: Ultrasound-guided core biopsy of left upper quadrant omental mass. Electronically Signed   By: Jacqulynn Cadet M.D.   On: 01/01/2018 16:18   Dg Swallowing Func-speech Pathology  Result Date: 01/02/2018 Objective Swallowing Evaluation: Type of Study: MBS-Modified Barium Swallow Study  Patient Details Name: Bobbie Valletta MRN: [id number] Date of Birth: 1932-03-24 Today's Date: 01/02/2018 Time: SLP Start Time (ACUTE ONLY): 0947 -SLP Stop Time (ACUTE ONLY): 1000 SLP Time Calculation (min) (ACUTE ONLY): 13 min Past Medical History: Past Medical History: Diagnosis Date . Anemia  . Arthritis   "right hip; right knee; lower back ~ 1/2 way across" . Atrial fibrillation ([place])   remotely  . Atrial flutter ([place])   typical appearing by ekg. s/p ablation 07/28/11 . Diverticulosis  . Dysphagia   with solids . Esophageal stricture  . GERD (gastroesophageal reflux disease)  . H/O hiatal hernia  . Heart murmur  . Hemorrhoids  . High cholesterol  . Hypertension  . Osteopenia  . Seizures ([place])   "back w/migraine headaches; 1990's or before" . Sinus bradycardia   asymptomatic . SSS (sick sinus syndrome) ([place])  . Stroke [facility]) 2012  "left me weaker on left side; balance is not good" Past Surgical History:  Past Surgical History: Procedure Laterality Date . ASD REPAIR  1972  Patch repair . ATRIAL FLUTTER ABLATION N/A 07/28/2011  Procedure: ATRIAL FLUTTER ABLATION;  Surgeon: Thompson Grayer, MD;  Location: [facility] CATH LAB;  Service: Cardiovascular;  Laterality: N/A; . CARDIAC ELECTROPHYSIOLOGY STUDY AND ABLATION  07/28/11 . CARDIOVASCULAR STRESS TEST  03/07/2010  Perfusion defect in the inferior myocardial region is consistent with diaphragmatic attenuation. No ischemia or infarct/scar is seen in  the remaining myocardium. No ECG changes. . CAROTID DOPPLER  06/26/2011  Bilateral ICAs-demonstrated normal patency without eivdence of significant diameter reduction, dissection, or any other vascular abnormality. . INGUINAL HERNIA REPAIR  2011  right . IR US GUIDE BX ASP/DRAIN  01/01/2018 . JOINT REPLACEMENT    right knee replacement . KNEE ARTHROSCOPY    right; "maybe twice" . TOTAL KNEE ARTHROPLASTY  07/10/2012  Procedure: TOTAL KNEE ARTHROPLASTY;  Surgeon: Tobi Bastos, MD;  Location: [facility] ORS;  Service: Orthopedics;  Laterality: Right; . TRANSTHORACIC ECHOCARDIOGRAM  12/10/2012  EF 55-60%. No regional wall motion abnormalities. LA moderately dialted.Mild-moderate regurg of the tricuspid valve. HPI: Pt is an 84 y.o. male who presents with complaints of cough, weakness, shortness of breath and fever for approximately 1 week. 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 Pt had an acute episode of choking on water and slurred speech with MRI positive for bilateral acute occipital lobe infarcts, R > L. BSE in February 2014 with a functional appearing oropharyngeal swallow; regular diet, thin liquids recommended. PMH: GERD, esophagel stricture, dilation, dysphagia to solids, stroke, seizures, HTN, HH, afib, chronic back pain  Subjective: pt alert, cooperative, not a clear historian Assessment / Plan / Recommendation CHL IP CLINICAL IMPRESSIONS 01/02/2018 Clinical Impression Pt has a moderate  oropharyngeal dysphagia that may be an acute exacerbation of a likely chronic dysphagia given structural component. Orally he has weak lingual manipulation, tongue pumping, and slow transit. Rather than containing cohesive boluses he allows liquids/solids to spill posteriorly into  the pharynx as his tongue is pumping, which contributes partially to his multiple swallows. Pt does also have large suspected osteophytes (MD not present to confirm), most prominent at C4-5, C5-6, that impacts bolus flow and epiglottic deflection. He has moderate vallecular residue, which may also contribute to his multiple swallows in an attempt to reduce residue. He has aspiration of thin liquids that occurs repeatedly as he continues to swallow a bolus. He says he cannot tuck his chin because he cannot move his neck that way. Limiting bolus size to teaspoon helps to better contain the thin liquids above the laryngeal vestibule, preventing further penetration/aspiration. Pt has better airway protection with thicker substances, but his residue increases as well. Once the vallecula is full from soft solids, nectar thick liquids start to penetrate into the vestibule. Recommend Dys 1 diet and nectar thick liquids. SLP will f/u to determine if water via provale cup may be appropriate in between meals. Pt would benefit from SLP f/u for utilization of strategies, strengthening to maximize function in light of structural component. SLP Visit Diagnosis Dysphagia, oropharyngeal phase (R13.12) Attention and concentration deficit following -- Frontal lobe and executive function deficit following -- Impact on safety and function Mild aspiration risk;Moderate aspiration risk   CHL IP TREATMENT RECOMMENDATION 01/02/2018 Treatment Recommendations Therapy as outlined in treatment plan below   Prognosis 01/02/2018 Prognosis for Safe Diet Advancement Fair Barriers to Reach Goals Other (Comment) Barriers/Prognosis Comment -- CHL IP DIET RECOMMENDATION  01/02/2018 SLP Diet Recommendations Dysphagia 1 (Puree) solids;Nectar thick liquid Liquid Administration via Cup;No straw Medication Administration Crushed with puree Compensations Slow rate;Small sips/bites;Follow solids with liquid Postural Changes Remain semi-upright after after feeds/meals (Comment);Seated upright at 90 degrees   CHL IP OTHER RECOMMENDATIONS 01/02/2018 Recommended Consults -- Oral Care Recommendations Oral care BID Other Recommendations --   CHL IP FOLLOW UP RECOMMENDATIONS 01/02/2018 Follow up Recommendations (No Data)   CHL IP FREQUENCY AND DURATION 01/02/2018 Speech Therapy  Frequency (ACUTE ONLY) min 2x/week Treatment Duration 2 weeks      CHL IP ORAL PHASE 01/02/2018 Oral Phase Impaired Oral - Pudding Teaspoon -- Oral - Pudding Cup -- Oral - Honey Teaspoon -- Oral - Honey Cup -- Oral - Nectar Teaspoon -- Oral - Nectar Cup Weak lingual manipulation;Lingual pumping;Reduced posterior propulsion;Piecemeal swallowing;Delayed oral transit;Decreased bolus cohesion Oral - Nectar Straw -- Oral - Thin Teaspoon Weak lingual manipulation;Lingual pumping;Reduced posterior propulsion;Piecemeal swallowing;Delayed oral transit;Decreased bolus cohesion Oral - Thin Cup Weak lingual manipulation;Lingual pumping;Reduced posterior propulsion;Piecemeal swallowing;Delayed oral transit;Decreased bolus cohesion Oral - Thin Straw -- Oral - Puree Weak lingual manipulation;Lingual pumping;Reduced posterior propulsion;Piecemeal swallowing;Delayed oral transit;Decreased bolus cohesion Oral - Mech Soft Weak lingual manipulation;Lingual pumping;Reduced posterior propulsion;Piecemeal swallowing;Delayed oral transit;Decreased bolus cohesion;Impaired mastication Oral - Regular -- Oral - Multi-Consistency -- Oral - Pill -- Oral Phase - Comment --  CHL IP PHARYNGEAL PHASE 01/02/2018 Pharyngeal Phase Impaired Pharyngeal- Pudding Teaspoon -- Pharyngeal -- Pharyngeal- Pudding Cup -- Pharyngeal -- Pharyngeal- Honey Teaspoon -- Pharyngeal  -- Pharyngeal- Honey Cup -- Pharyngeal -- Pharyngeal- Nectar Teaspoon -- Pharyngeal -- Pharyngeal- Nectar Cup Delayed swallow initiation-pyriform sinuses;Reduced epiglottic inversion;Reduced airway/laryngeal closure;Reduced tongue base retraction;Pharyngeal residue - valleculae;Penetration/Aspiration during swallow Pharyngeal Material enters airway, remains ABOVE vocal cords then ejected out Pharyngeal- Nectar Straw -- Pharyngeal -- Pharyngeal- Thin Teaspoon Delayed swallow initiation-pyriform sinuses;Reduced epiglottic inversion;Reduced airway/laryngeal closure;Reduced tongue base retraction;Pharyngeal residue - valleculae Pharyngeal -- Pharyngeal- Thin Cup Delayed swallow initiation-pyriform sinuses;Reduced epiglottic inversion;Reduced airway/laryngeal closure;Reduced tongue base retraction;Pharyngeal residue - valleculae;Penetration/Aspiration during swallow;Penetration/Apiration after swallow Pharyngeal Material enters airway, passes BELOW cords and not ejected out despite cough attempt by patient Pharyngeal- Thin Straw -- Pharyngeal -- Pharyngeal- Puree Delayed swallow initiation-pyriform sinuses;Reduced epiglottic inversion;Reduced airway/laryngeal closure;Reduced tongue base retraction;Pharyngeal residue - valleculae Pharyngeal -- Pharyngeal- Mechanical Soft Delayed swallow initiation-pyriform sinuses;Reduced epiglottic inversion;Reduced airway/laryngeal closure;Reduced tongue base retraction;Pharyngeal residue - valleculae Pharyngeal -- Pharyngeal- Regular -- Pharyngeal -- Pharyngeal- Multi-consistency -- Pharyngeal -- Pharyngeal- Pill -- Pharyngeal -- Pharyngeal Comment --  CHL IP CERVICAL ESOPHAGEAL PHASE 01/02/2018 Cervical Esophageal Phase WFL Pudding Teaspoon -- Pudding Cup -- Honey Teaspoon -- Honey Cup -- Nectar Teaspoon -- Nectar Cup -- Nectar Straw -- Thin Teaspoon -- Thin Cup -- Thin Straw -- Puree -- Mechanical Soft -- Regular -- Multi-consistency -- Pill -- Cervical Esophageal Comment -- No  flowsheet data found. Germain Osgood 01/02/2018, 12:47 PM  Germain Osgood, M.A. CCC-SLP [phone]              PHYSICAL EXAM Frail cachectic elderly African-American male not in distress. . Afebrile. Head is nontraumatic. Neck is supple without bruit.    Cardiac exam no murmur or gallop. Lungs are clear to auscultation. Distal pulses are well felt. Neurological Exam :  Drowsy likely medication effect. Speech is hypophonic and slightly dysarthric Follows commands quite well. Extraocular movements are full range without nystagmus. Blinks to threat bilaterally. Fundi not visualized. Vision acuity seems adequate. Face is symmetric without weakness. Tongue is midline. Motor system exam reveals symmetric upper extremity strength without 4/5weakness. Lower extremity exam shows mild paraparesis bilaterally with 2/5 proximal hip flexor weakness with mild hip abductor and knee flexor weakness.mild 4/5 weakness distally. Deep tendon reflexes are brisk in both upper and lower extremities. Plantars are both equivocal. Sensation appears intact bilaterally. Gait deferred ASSESSMENT/PLAN Mr. Olof Marcil is a 84 y.o. 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  Admitted for sepsis and encephalopathy.  During hospitalization, patient had an episode of slurred speech and neurology was consulted.  MRI shows bilateral occipital infarcts, which are an incidental finding.   Stroke:   Incidental bilateral occipital subcortical infarcts, embolic secondary to known AF on warfarin vs d/t Small vessel disease. -etiology of strokes likely due to anticoagulation reversal with now suboptimal INR  INR was 10 on admission with warfarin reversed.  Possibly  lead to hypercoagulable state.    CT head No acute stroke. Small vessel disease. Atrophy.   MRI bilateral right greater than left occipital lobe infarcts.  Right occipital and cortical area.  Left occipital and white matter.  Advanced cerebral atrophy.  White matter disease.  2D Echo  EF 65-70%. No source of embolus   EEG with background slowing, no seizure activity LDL pending   HgbA1c pending   Warfarin for VTE prophylaxis  warfarin daily prior to admission INR 10.0, reversed on arrival, now on warfarin daily. INR 1.75.  Recommend continue warfarin for secondary stroke prevention  Therapy recommendations:  pending   Disposition:  pending   BLE hip weakness clearly related to myelopathy with vertebral body, clivus and epidural metastasis and degenerative lumbar spine disease with L4-5 bilateral foraminal narrowing  Brisk reflexes   Not related to stroke  Cervical and thoracic spine MRI w/o contrast to eval for mets, versus myelopathy  Atrial Fibrillation/Flutter  Home anticoagulation:  warfarin daily continued in the hospital  INR 10.0 on admission, reversed  Back on warfarin with pharmacy managing   INR 1.75    Hypertension  Stable . BP goal normotensive  Hyperlipidemia  Home meds: Tour 80 mg daily, resumed in hospital  LDL pending, goal < 70  Continue statin at discharge  Other Stroke Risk Factors  Advanced age  ETOH use, advised to drink no more than 2 drink(s) a day  Hx seizures w Migraines. EEG neg for seziures.  Hx stroke  ASD repair 9407  Chronic diastolic CHF  Other Active Problems  Right upper lobe  lung mass  Hypoxemia  Coagulopathy   Postobstructive PNA  Acute hypoxic respiratory failure  1 of 2 blood cultures positive for staph felt to be contaminant  Hospital day # 4     . The patient has complaints of subacute back pain and increasing leg weakness and difficulty walking which cannot be explained by the tiny bilateral  occipital white matter infarcts which are likely embolic from his atrial fibrillation with anticoagulation been recently reversed and handed.MRI scans of his spine to suggest metastatic disease involving vertebral body, clivus and epidural metastasis as well as significant degenerative lumbar spine disease. Long discussion with the patient and Dr. Tyrell Antonio and answered questions.there is no indication for steroids as there is no definite evidence of cord compression. Recommend oncology consult the patient may need biopsy for tissue diagnosis. Greater than 50% time during this 25 minute visit was spent in counseling and coordination of care about patient's strokes and leg weakness and gait difficulty and answering questions  Antony Contras, [place] Pager: [phone] 01/03/2018 2:20 PM  To contact Stroke Continuity provider, please refer to [email or web address] After hours, contact General Neurology

## 2018-01-04 DIAGNOSIS — J181 Lobar pneumonia, unspecified organism: Secondary | ICD-10-CM

## 2018-01-04 DIAGNOSIS — I639 Cerebral infarction, unspecified: Secondary | ICD-10-CM

## 2018-01-04 DIAGNOSIS — M5000 Cervical disc disorder with myelopathy, unspecified cervical region: Secondary | ICD-10-CM

## 2018-01-04 LAB — CULTURE, BLOOD (ROUTINE X 2)
CULTURE: NO GROWTH
SPECIAL REQUESTS: ADEQUATE

## 2018-01-04 LAB — GLUCOSE, CAPILLARY: GLUCOSE-CAPILLARY: 99 mg/dL (ref 70–99)

## 2018-01-04 LAB — PROTIME-INR
INR: 2.83
Prothrombin Time: 29.5 seconds — ABNORMAL HIGH (ref 11.4–15.2)

## 2018-01-04 MED ORDER — WARFARIN SODIUM 1 MG PO TABS: 1.0000 mg | ORAL_TABLET | Freq: Once | ORAL | Status: DC

## 2018-01-04 MED ORDER — AMOXICILLIN-POT CLAVULANATE 875-125 MG PO TABS: 1.0000 | ORAL_TABLET | Freq: Two times a day (BID) | ORAL | Status: AC

## 2018-01-04 MED ORDER — HYDROCODONE-ACETAMINOPHEN 10-325 MG PO TABS: 1.0000 | ORAL_TABLET | ORAL | 0 refills | Status: AC | PRN

## 2018-01-04 MED ORDER — AMOXICILLIN-POT CLAVULANATE 875-125 MG PO TABS: 1.0000 | ORAL_TABLET | Freq: Two times a day (BID) | ORAL | Status: DC

## 2018-01-04 NOTE — Progress Notes (Signed)
[facility] PROGRESS NOTE  Jay Walters RXY:[id number] DOB: 1931/09/05 DOA: 12/30/2017  PCP: Elby Showers, MD  Brief History/Interval Summary: 84 y.o.malewith medical history significant ofpretension, stroke, chronic back pain regard to cervical disc and lumbar disc myelopathy atrial fibrillation and flutter now in presents the emergency department with complaints of cough, weakness, shortness of breath and fever for approximately 1 week. In the emergency department his white blood cell count was noted to be 22,000, his lactate is elevated at 2 his BNP is also elevated. He received 2 L of fluid boluses in the emergency department as well as ceftriaxone and azithromycin. His chest x-ray showed significant abnormalities in the right upper lobe with a severe consolidation and possible pleural fluid. 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 Conversation with his wife she reports that the patient has recently seen his neurosurgeon due to pain across his shoulders and back that was thought due to his cervical disease. He received steroid injections in his shoulder with no relief. [facility] were asked to admit. Admitted with lung mass, leukocytosis, possible post obstructive PNA. Develops slurred speech and worsening weakness lower extremities. He has chronic back pain and some myelopathy. He was found to have bilateral occipital stroke, incidental finding. Stroke doesn't correlate with Lower extremities weakness. Cervical , thoracic MRI ordered which showed cervical and para spinal mass consistent with metastasis diseases.   Reason for Visit: Metastatic cancer likely non-small cell lung cancer  Consultants: Pulmonology.  Neurology.  Medical oncology.  Procedures:   EEG This is an abnormal  EEG secondary to posterior background slowing.  This finding may be seen with a diffuse gray matter disturbance that is etiologically nonspecific, but may include a dementia, among other possibilities.  No epileptiform activity is noted.   US Guided biopsy of the omental mass 7/23  Transthoracic echocardiogram Study Conclusions  - Left ventricle: The cavity size was normal. Systolic function was   vigorous. The estimated ejection fraction was in the range of 65%   to 70%. Wall motion was normal; there were no regional wall   motion abnormalities. Doppler parameters are consistent with   abnormal left ventricular relaxation (grade 1 diastolic   dysfunction). - Aortic valve: There was trivial regurgitation. - Left atrium: The atrium was mildly dilated. - Right ventricle: The cavity size was mildly dilated. Wall   thickness was normal. - Right atrium: The atrium was mildly dilated.  Antibiotics: Ceftriaxone 7-21--7-23 Azithromycin 7-21--7/26 Vancomycin 7-23--7/25 Zosyn 7-23--7/25 Unasyn 7/25-7/26 Augmentin 7/26    Subjective/Interval History: Patient confused.  Unable to provide any information.  No family at bedside.  ROS: Unable to do due to his confusion  Objective:  Vital Signs  Vitals:   01/03/18 0741 01/03/18 1632 01/03/18 2326 01/04/18 0743  BP: (!) 102/53 (!) 103/59 (!) 105/55 (!) 120/57  Pulse: 81 78 (!) 59 66  Resp: _0 Temp: 98.1 F (36.7 C)  (!) 97.5 F (36.4 C) 98 F (36.7 C)  TempSrc: Oral  Oral Oral  SpO2: 94% 98% 97% 98%  Weight:      Height:        Intake/Output Summary (Last 24 hours) at 01/04/2018 1108 Last data filed at 01/04/2018 1027 Gross per 24 hour  Intake 2355.88 ml  Output 700 ml  Net 1655.88 ml  Filed Weights   12/30/17 0845 01/01/18 0500 01/03/18 0500  Weight: 60.3 kg (133 lb) 65.4 kg (144 lb 2.9 oz) 65.9 kg (145 lb 4.5 oz)    General appearance: alert, appears stated age, distracted and no distress Head:  Normocephalic, without obvious abnormality, atraumatic Resp: Scattered wheezes heard bilaterally.  Normal effort at rest. Cardio: regular rate and rhythm, S1, S2 normal, no murmur, click, rub or gallop GI: soft, non-tender; bowel sounds normal; no masses,  no organomegaly Extremities: extremities normal, atraumatic, no cyanosis or edema Pulses: 2+ and symmetric Neurologic: Remains confused.  Noted to be moving his extremities.  Lab Results:  Data Reviewed: I have personally reviewed following labs and imaging studies  CBC: Recent Labs  Lab 12/30/17 0659 12/31/17 0312 01/01/18 0803 01/02/18 0318 01/03/18 1204  WBC 22.9* 24.6* 28.7* 29.1* 30.7*  NEUTROABS 18.0*  --   --   --   --   HGB 13.2 12.4* 12.4* 12.4* 11.7*  HCT 41.7 38.1* 39.3 39.7 37.0*  MCV 96.5 94.1 97.5 98.3 96.4  PLT 287 232 227 194 951    Basic Metabolic Panel: Recent Labs  Lab 12/30/17 0659 12/31/17 0312 01/01/18 0803 01/02/18 0318 01/03/18 1204  NA 143 142 144 148* 146*  K 3.7 3.6 4.3 4.2 4.0  CL 110 112* 116* 119* 119*  CO2 20* 22 19* 20* 20*  GLUCOSE 117* 142* 105* 113* 116*  BUN 28* 28* 27* 29* 31*  CREATININE 1.23 0.97 0.90 1.09 1.34*  CALCIUM 8.2* 8.3* 8.1* 8.3* 8.1*    GFR: Estimated Creatinine Clearance: 38.3 mL/min (A) (by C-G formula based on SCr of 1.34 mg/dL (H)).  Liver Function Tests: Recent Labs  Lab 12/30/17 0659 01/02/18 0318  AST 16  --   ALT 15  --   ALKPHOS 67  --   BILITOT 0.9  --   PROT 5.2*  --   ALBUMIN 2.1* 1.9*     Recent Labs  Lab 01/01/18 1113  AMMONIA 21    Coagulation Profile: Recent Labs  Lab 01/01/18 0202 01/01/18 1637 01/02/18 0318 01/03/18 0247 01/04/18 0328  INR 1.61 1.57 1.75 2.42 2.83    Cardiac Enzymes: Recent Labs  Lab 01/01/18 2132  CKTOTAL 107    HbA1C: Recent Labs    01/03/18 0247  HGBA1C 5.8*    CBG: Recent Labs  Lab 01/01/18 0742 01/02/18 0739 01/03/18 0744 01/04/18 0744  GLUCAP 115* 100* 104* 99    Lipid  Profile: Recent Labs    01/03/18 0247  CHOL 93  HDL 25*  LDLCALC 35  TRIG 165*  CHOLHDL 3.7    Thyroid Function Tests: Recent Labs    01/01/18 1113  TSH 1.748    Anemia Panel: Recent Labs    01/01/18 1113  VITAMINB12 317    Recent Results (from the past 240 hour(s))  Blood Culture (routine x 2)     Status: Abnormal   Collection Time: 12/30/17  7:55 AM  Result Value Ref Range Status   Specimen Description BLOOD RIGHT HAND  Final   Special Requests   Final    BOTTLES DRAWN AEROBIC AND ANAEROBIC Blood Culture adequate volume   Culture  Setup Time   Final    GRAM POSITIVE COCCI AEROBIC BOTTLE ONLY CRITICAL RESULT CALLED TO, READ BACK BY AND VERIFIED WITH: PHARMD M TURNER [id number] MLM    Culture (A)  Final    STAPHYLOCOCCUS SPECIES (COAGULASE NEGATIVE) THE SIGNIFICANCE OF ISOLATING THIS ORGANISM FROM A SINGLE SET OF BLOOD  CULTURES WHEN MULTIPLE SETS ARE DRAWN IS UNCERTAIN. PLEASE NOTIFY THE MICROBIOLOGY DEPARTMENT WITHIN ONE WEEK IF SPECIATION AND SENSITIVITIES ARE REQUIRED. Performed at [facility], [place] [address] [place], [place] [address]    Report Status 01/02/2018 FINAL  Final  Blood Culture ID Panel (Reflexed)     Status: Abnormal   Collection Time: 12/30/17  7:55 AM  Result Value Ref Range Status   Enterococcus species NOT DETECTED NOT DETECTED Final   Listeria monocytogenes NOT DETECTED NOT DETECTED Final   Staphylococcus species DETECTED (A) NOT DETECTED Final    Comment: Methicillin (oxacillin) susceptible coagulase negative staphylococcus. Possible blood culture contaminant (unless isolated from more than one blood culture draw or clinical case suggests pathogenicity). No antibiotic treatment is indicated for blood  culture contaminants. CRITICAL RESULT CALLED TO, READ BACK BY AND VERIFIED WITH: PHARMD M TURNER [id number] MLM    Staphylococcus aureus NOT DETECTED NOT DETECTED Final   Methicillin resistance NOT DETECTED NOT DETECTED Final    Streptococcus species NOT DETECTED NOT DETECTED Final   Streptococcus agalactiae NOT DETECTED NOT DETECTED Final   Streptococcus pneumoniae NOT DETECTED NOT DETECTED Final   Streptococcus pyogenes NOT DETECTED NOT DETECTED Final   Acinetobacter baumannii NOT DETECTED NOT DETECTED Final   Enterobacteriaceae species NOT DETECTED NOT DETECTED Final   Enterobacter cloacae complex NOT DETECTED NOT DETECTED Final   Escherichia coli NOT DETECTED NOT DETECTED Final   Klebsiella oxytoca NOT DETECTED NOT DETECTED Final   Klebsiella pneumoniae NOT DETECTED NOT DETECTED Final   Proteus species NOT DETECTED NOT DETECTED Final   Serratia marcescens NOT DETECTED NOT DETECTED Final   Haemophilus influenzae NOT DETECTED NOT DETECTED Final   Neisseria meningitidis NOT DETECTED NOT DETECTED Final   Pseudomonas aeruginosa NOT DETECTED NOT DETECTED Final   Candida albicans NOT DETECTED NOT DETECTED Final   Candida glabrata NOT DETECTED NOT DETECTED Final   Candida krusei NOT DETECTED NOT DETECTED Final   Candida parapsilosis NOT DETECTED NOT DETECTED Final   Candida tropicalis NOT DETECTED NOT DETECTED Final    Comment: Performed at [facility], [address], [place], [place] [address]  Blood Culture (routine x 2)     Status: None   Collection Time: 12/30/17  8:00 AM  Result Value Ref Range Status   Specimen Description BLOOD LEFT ANTECUBITAL  Final   Special Requests   Final    BOTTLES DRAWN AEROBIC AND ANAEROBIC Blood Culture adequate volume   Culture   Final    NO GROWTH 5 DAYS Performed at [facility], [place] [address], [place], [place] [address]    Report Status 01/04/2018 FINAL  Final  Culture, blood (routine x 2)     Status: None (Preliminary result)   Collection Time: 01/01/18  4:40 PM  Result Value Ref Range Status   Specimen Description BLOOD RIGHT ARM  Final   Special Requests   Final    BOTTLES DRAWN AEROBIC ONLY Blood Culture adequate volume   Culture   Final    NO GROWTH  3 DAYS Performed at [facility], [place] [address], [place], [place] [address]    Report Status PENDING  Incomplete  Culture, blood (routine x 2)     Status: None (Preliminary result)   Collection Time: 01/01/18  4:45 PM  Result Value Ref Range Status   Specimen Description BLOOD RIGHT HAND  Final   Special Requests   Final    BOTTLES DRAWN AEROBIC ONLY Blood  Culture adequate volume   Culture   Final    NO GROWTH 3 DAYS Performed at [facility], [place] [address], [place], [place] [address]    Report Status PENDING  Incomplete  MRSA PCR Screening     Status: None   Collection Time: 01/03/18 12:25 PM  Result Value Ref Range Status   MRSA by PCR NEGATIVE NEGATIVE Final    Comment:        The GeneXpert MRSA Assay (FDA approved for NASAL specimens only), is one component of a comprehensive MRSA colonization surveillance program. It is not intended to diagnose MRSA infection nor to guide or monitor treatment for MRSA infections. Performed at [facility], [place] [address] [place], [place] [address]       Radiology Studies: Mr Cervical Spine Wo Contrast  Result Date: 01/02/2018 CLINICAL DATA:  Back pain.  Myelopathy.  Metastatic lung cancer. EXAM: MRI CERVICAL, THORACIC SPINE WITHOUT CONTRAST TECHNIQUE: Multiplanar and multiecho pulse sequences of the cervical spine, to include the craniocervical junction and cervicothoracic junction, and thoracic spine, were obtained without intravenous contrast. COMPARISON:  CT chest December 30, 2017, MRI cervical spine May 08, 2011. FINDINGS: MRI CERVICAL SPINE FINDINGS ALIGNMENT: Broad reversed cervical lordosis.  No malalignment. VERTEBRAE/DISCS: Abnormal infiltrative low T1, bright STIR signal LEFT C5, C6 articular pillars and lateral vertebral bodies, and adjacent soft tissues. Intact vertebral bodies intact. Severe progressed disc height loss C3-4 through C6-7 with severe subacute discogenic endplate changes. Heterogeneously  bright STIR signal C4-5 disc favoring mineralization. Moderate to severe C3-4 degenerative disc. 8 mm low signal lesion in clivus, potential metastasis. CORD:Multifocal spinal cord myelomalacia at sites of prior cord edema. No syrinx. POSTERIOR FOSSA, VERTEBRAL ARTERIES, PARASPINAL TISSUES: No MR findings of ligamentous injury. Vertebral artery flow voids present. Multiple paraspinous cystic masses at C7 and T1 measuring to 17 mm these may arrive from the spinous process. DISC LEVELS: C2-3: Uncovertebral hypertrophy. Severe RIGHT and moderate LEFT facet arthropathy. No canal stenosis. Moderate RIGHT neural foraminal narrowing. C3-4: Uncovertebral hypertrophy mild facet arthropathy. No canal stenosis. Mild neural foraminal narrowing. C4-5: Uncovertebral hypertrophy. Mild facet arthropathy. No canal stenosis. Moderate LEFT neural foraminal narrowing with soft tissue component. C5-6: Small broad-based disc osteophyte complex. Abnormal soft tissue effacing LEFT neural foramen. No canal stenosis. Moderate RIGHT neural foraminal narrowing. C6-7: Small broad-based disc osteophyte complex. No canal stenosis. Moderate to severe bilateral neural foraminal narrowing. C7-T1: Uncovertebral hypertrophy. No canal stenosis. Mild LEFT neural foraminal narrowing. MRI THORACIC SPINE FINDINGS ALIGNMENT: Maintenance of the thoracic kyphosis. No malalignment. VERTEBRAE/DISCS: Vertebral bodies are intact. Intervertebral discs morphology maintained with mild desiccation. Abnormal signal bilateral T12 ribs compatible with metastasis. Subcentimeter bright STIR signal RIGHT T5 facet. CORD: Thoracic spinal cord is normal morphology and signal characteristics to the level of the conus medullaris which terminates at. PREVERTEBRAL AND PARASPINAL SOFT TISSUES: Abnormal masses within the mediastinum and RIGHT lung consistent with tumor with small RIGHT pleural effusion. DISC LEVELS: No disc bulge, canal stenosis nor neural foraminal narrowing.  IMPRESSION: MRI cervical spine: 1. LEFT C5 and C6 interosseous and paraspinal mass consistent with metastatic disease. Paraspinous nodules C7 and T1 concerning for metastatic disease. Potential clival metastasis. No pathologic fracture. Contrast enhanced sequences would better characterize tumor. 2. Multi cervical spinal cord myelomalacia.  No syrinx. 3. Progressed degenerative change of the cervical spine. No canal stenosis. 4. Neural foraminal narrowing C2-3 through C6-7: Moderate to severe at C6-7. Tumor effacing LEFT C5-6 and to lesser extent LEFT C4-5 neural  foramen. MRI thoracic spine: 1. Bilateral T12 rib osseous metastasis. Extensive mediastinal and RIGHT lung metastasis. RIGHT T5 facet metastasis without pathologic fracture. Contrast enhanced sequences may detected additional metastasis. 2. No canal stenosis or neural foraminal narrowing. Electronically Signed   By: Elon Alas M.D.   On: 01/02/2018 19:06   Mr Cervical Spine W Contrast  Result Date: 01/03/2018 CLINICAL DATA:  84 year old male with large right lung mass discovered four days ago. Slurred speech and worsening lower extremity weakness. Lower extremity symptoms not explained by small posterior circulation infarcts seen on MRI 01/01/2018. Spinal and paraspinal metastases in the cervical and thoracic spine demonstrated yesterday without IV contrast. EXAM: MRI CERVICAL SPINE WITH CONTRAST MRI THORACIC SPINE WITH CONTRAST MRI LUMBAR SPINE WITHOUT AND WITH CONTRAST TECHNIQUE: Multisequence MR imaging of the spine from the cervical spine to the sacrum was performed prior to and following IV contrast administration for evaluation of spinal metastatic disease. CONTRAST:  58m MULTIHANCE GADOBENATE DIMEGLUMINE 529 MG/ML IV SOLN COMPARISON:  Noncontrast cervical and thoracic MRI 01/02/2018. Noncontrast brain MRI 01/01/2018. Chest CT 12/30/2017. FINDINGS: MRI CERVICAL SPINE FINDINGS Alignment: Stable since yesterday with mild reversal of cervical  lordosis. Vertebrae: Left vertebral enhancing metastasis with epidural and paraspinal extraosseous extension re-demonstrated at the C5-C6 level on the left. As seen yesterday, the tumor is approximately 3 centimeters in diameter centered at the left C5 pedicle and C6 neural foramen. There is epidural extension of tumor into the left lateral spinal canal at C5 (series 19, image 18). This abuts the lateral left spinal cord without mass effect on the cord. The left C5 and C6 neural foramina are completely invaded by tumor. No other cervical vertebral tumor identified. Cord: No abnormal spinal cord enhancement. Abnormal left lateral epidural space at C5, but no dural thickening. Posterior Fossa, vertebral arteries, paraspinal tissues: There is a 17 millimeter round posterior right paraspinal soft tissue metastasis abutting the C7 spinous process (series 18, image 7 and series 19, image 31. The small clivus lesion is enhancing and highly suspicious for small metastasis. No abnormal enhancement identified in the posterior fossa. Disc levels: Stable since yesterday, no cervical spinal stenosis. MRI THORACIC SPINE FINDINGS Segmentation: Absent ribs at T12. This is the same numbering system used yesterday. Alignment:  Stable.  Moderate dextroconvex scoliosis. Vertebrae: The ribs at T12 are absent, not involved by tumor or metastasis. No thoracic vertebral tumor is identified. The right lung mass closely abuts the right thoracic spine but there is no foraminal or vertebral invasion identified. Cord: On the sagittal post-contrast images today there is speckled artifact projecting over the T8 and T9 spinal levels. No abnormal spinal cord enhancement. No thoracic dural thickening or enhancement. Paraspinal and other soft tissues: Stable since yesterday. Disc levels: Stable since yesterday, no thoracic spinal stenosis. MRI LUMBAR SPINE FINDINGS Segmentation:  Normal, confirming absent ribs at T12. Alignment: Preserved lumbar  lordosis. Superimposed dextroconvex upper lumbar curvature. Vertebrae: There is a 2 centimeter round enhancing metastasis in the right superior L2 vertebral body (series 5, image 6). No extraosseous extension. No other lumbar metastasis identified. No central sacral metastasis. Possible small metastasis partially visible in the medial right iliac bone on series 8, image 34. Conus medullaris: Extends to the T11-T12 level and was best demonstrated on the thoracic MRI yesterday. The cauda equina nerve roots are non thickened and there is no abnormal lumbar intradural enhancement. No dural thickening. Paraspinal and other soft tissues: There is abnormal prevertebral and mesenteric soft tissue nodules with enhancement in the  visible abdomen suggesting widespread metastatic lymphadenopathy or mesenteric tumor. Some of the largest lesions are 15-17 millimeter short axis (series 7, image 25 and 27). Superimposed small volume of pelvic free fluid (series 6, image 1). Negative visualized posterior paraspinal soft tissues. Disc levels: Lumbar spine degeneration, worst at L4-L5 and eccentric to the right. No associated spinal stenosis. There is severe right L4 foraminal stenosis. IMPRESSION: 1. No spinal cord or cauda equina metastasis. No spinal cord or cauda equina stenosis/compression. But there is severe degenerative right L4 neural foraminal stenosis. 2. No thoracic spine metastasis (absent ribs at T12). Isolated small lumbar metastasis in the L2 vertebral body. 3. Re-demonstrated left C5 vertebral and paraspinal metastasis with infiltration of the left C5 and C6 neural foramina. There is left lateral epidural tumor at the C5 spinal cord level, but no cord compression or enhancement. 4. Extensive soft tissue nodularity in the visible lower abdominal mesentery and lumbar prevertebral space compatible with disseminated soft tissue or lymph node metastases. Associated small volume lower abdominal and pelvic free fluid. 5.  Small posterior paraspinal soft tissue metastasis abutting the C7 spinous process. Small clivus metastasis suspected. Electronically Signed   By: Genevie Ann M.D.   On: 01/03/2018 12:12   Mr Thoracic Spine Wo Contrast  Result Date: 01/02/2018 CLINICAL DATA:  Back pain.  Myelopathy.  Metastatic lung cancer. EXAM: MRI CERVICAL, THORACIC SPINE WITHOUT CONTRAST TECHNIQUE: Multiplanar and multiecho pulse sequences of the cervical spine, to include the craniocervical junction and cervicothoracic junction, and thoracic spine, were obtained without intravenous contrast. COMPARISON:  CT chest December 30, 2017, MRI cervical spine May 08, 2011. FINDINGS: MRI CERVICAL SPINE FINDINGS ALIGNMENT: Broad reversed cervical lordosis.  No malalignment. VERTEBRAE/DISCS: Abnormal infiltrative low T1, bright STIR signal LEFT C5, C6 articular pillars and lateral vertebral bodies, and adjacent soft tissues. Intact vertebral bodies intact. Severe progressed disc height loss C3-4 through C6-7 with severe subacute discogenic endplate changes. Heterogeneously bright STIR signal C4-5 disc favoring mineralization. Moderate to severe C3-4 degenerative disc. 8 mm low signal lesion in clivus, potential metastasis. CORD:Multifocal spinal cord myelomalacia at sites of prior cord edema. No syrinx. POSTERIOR FOSSA, VERTEBRAL ARTERIES, PARASPINAL TISSUES: No MR findings of ligamentous injury. Vertebral artery flow voids present. Multiple paraspinous cystic masses at C7 and T1 measuring to 17 mm these may arrive from the spinous process. DISC LEVELS: C2-3: Uncovertebral hypertrophy. Severe RIGHT and moderate LEFT facet arthropathy. No canal stenosis. Moderate RIGHT neural foraminal narrowing. C3-4: Uncovertebral hypertrophy mild facet arthropathy. No canal stenosis. Mild neural foraminal narrowing. C4-5: Uncovertebral hypertrophy. Mild facet arthropathy. No canal stenosis. Moderate LEFT neural foraminal narrowing with soft tissue component. C5-6: Small  broad-based disc osteophyte complex. Abnormal soft tissue effacing LEFT neural foramen. No canal stenosis. Moderate RIGHT neural foraminal narrowing. C6-7: Small broad-based disc osteophyte complex. No canal stenosis. Moderate to severe bilateral neural foraminal narrowing. C7-T1: Uncovertebral hypertrophy. No canal stenosis. Mild LEFT neural foraminal narrowing. MRI THORACIC SPINE FINDINGS ALIGNMENT: Maintenance of the thoracic kyphosis. No malalignment. VERTEBRAE/DISCS: Vertebral bodies are intact. Intervertebral discs morphology maintained with mild desiccation. Abnormal signal bilateral T12 ribs compatible with metastasis. Subcentimeter bright STIR signal RIGHT T5 facet. CORD: Thoracic spinal cord is normal morphology and signal characteristics to the level of the conus medullaris which terminates at. PREVERTEBRAL AND PARASPINAL SOFT TISSUES: Abnormal masses within the mediastinum and RIGHT lung consistent with tumor with small RIGHT pleural effusion. DISC LEVELS: No disc bulge, canal stenosis nor neural foraminal narrowing. IMPRESSION: MRI cervical spine: 1. LEFT C5 and  C6 interosseous and paraspinal mass consistent with metastatic disease. Paraspinous nodules C7 and T1 concerning for metastatic disease. Potential clival metastasis. No pathologic fracture. Contrast enhanced sequences would better characterize tumor. 2. Multi cervical spinal cord myelomalacia.  No syrinx. 3. Progressed degenerative change of the cervical spine. No canal stenosis. 4. Neural foraminal narrowing C2-3 through C6-7: Moderate to severe at C6-7. Tumor effacing LEFT C5-6 and to lesser extent LEFT C4-5 neural foramen. MRI thoracic spine: 1. Bilateral T12 rib osseous metastasis. Extensive mediastinal and RIGHT lung metastasis. RIGHT T5 facet metastasis without pathologic fracture. Contrast enhanced sequences may detected additional metastasis. 2. No canal stenosis or neural foraminal narrowing. Electronically Signed   By: Elon Alas M.D.   On: 01/02/2018 19:06   Mr Thoracic Spine W Contrast  Result Date: 01/03/2018 CLINICAL DATA:  84 year old male with large right lung mass discovered four days ago. Slurred speech and worsening lower extremity weakness. Lower extremity symptoms not explained by small posterior circulation infarcts seen on MRI 01/01/2018. Spinal and paraspinal metastases in the cervical and thoracic spine demonstrated yesterday without IV contrast. EXAM: MRI CERVICAL SPINE WITH CONTRAST MRI THORACIC SPINE WITH CONTRAST MRI LUMBAR SPINE WITHOUT AND WITH CONTRAST TECHNIQUE: Multisequence MR imaging of the spine from the cervical spine to the sacrum was performed prior to and following IV contrast administration for evaluation of spinal metastatic disease. CONTRAST:  70m MULTIHANCE GADOBENATE DIMEGLUMINE 529 MG/ML IV SOLN COMPARISON:  Noncontrast cervical and thoracic MRI 01/02/2018. Noncontrast brain MRI 01/01/2018. Chest CT 12/30/2017. FINDINGS: MRI CERVICAL SPINE FINDINGS Alignment: Stable since yesterday with mild reversal of cervical lordosis. Vertebrae: Left vertebral enhancing metastasis with epidural and paraspinal extraosseous extension re-demonstrated at the C5-C6 level on the left. As seen yesterday, the tumor is approximately 3 centimeters in diameter centered at the left C5 pedicle and C6 neural foramen. There is epidural extension of tumor into the left lateral spinal canal at C5 (series 19, image 18). This abuts the lateral left spinal cord without mass effect on the cord. The left C5 and C6 neural foramina are completely invaded by tumor. No other cervical vertebral tumor identified. Cord: No abnormal spinal cord enhancement. Abnormal left lateral epidural space at C5, but no dural thickening. Posterior Fossa, vertebral arteries, paraspinal tissues: There is a 17 millimeter round posterior right paraspinal soft tissue metastasis abutting the C7 spinous process (series 18, image 7 and series 19, image  31. The small clivus lesion is enhancing and highly suspicious for small metastasis. No abnormal enhancement identified in the posterior fossa. Disc levels: Stable since yesterday, no cervical spinal stenosis. MRI THORACIC SPINE FINDINGS Segmentation: Absent ribs at T12. This is the same numbering system used yesterday. Alignment:  Stable.  Moderate dextroconvex scoliosis. Vertebrae: The ribs at T12 are absent, not involved by tumor or metastasis. No thoracic vertebral tumor is identified. The right lung mass closely abuts the right thoracic spine but there is no foraminal or vertebral invasion identified. Cord: On the sagittal post-contrast images today there is speckled artifact projecting over the T8 and T9 spinal levels. No abnormal spinal cord enhancement. No thoracic dural thickening or enhancement. Paraspinal and other soft tissues: Stable since yesterday. Disc levels: Stable since yesterday, no thoracic spinal stenosis. MRI LUMBAR SPINE FINDINGS Segmentation:  Normal, confirming absent ribs at T12. Alignment: Preserved lumbar lordosis. Superimposed dextroconvex upper lumbar curvature. Vertebrae: There is a 2 centimeter round enhancing metastasis in the right superior L2 vertebral body (series 5, image 6). No extraosseous extension. No other lumbar metastasis  identified. No central sacral metastasis. Possible small metastasis partially visible in the medial right iliac bone on series 8, image 34. Conus medullaris: Extends to the T11-T12 level and was best demonstrated on the thoracic MRI yesterday. The cauda equina nerve roots are non thickened and there is no abnormal lumbar intradural enhancement. No dural thickening. Paraspinal and other soft tissues: There is abnormal prevertebral and mesenteric soft tissue nodules with enhancement in the visible abdomen suggesting widespread metastatic lymphadenopathy or mesenteric tumor. Some of the largest lesions are 15-17 millimeter short axis (series 7, image 25 and  27). Superimposed small volume of pelvic free fluid (series 6, image 1). Negative visualized posterior paraspinal soft tissues. Disc levels: Lumbar spine degeneration, worst at L4-L5 and eccentric to the right. No associated spinal stenosis. There is severe right L4 foraminal stenosis. IMPRESSION: 1. No spinal cord or cauda equina metastasis. No spinal cord or cauda equina stenosis/compression. But there is severe degenerative right L4 neural foraminal stenosis. 2. No thoracic spine metastasis (absent ribs at T12). Isolated small lumbar metastasis in the L2 vertebral body. 3. Re-demonstrated left C5 vertebral and paraspinal metastasis with infiltration of the left C5 and C6 neural foramina. There is left lateral epidural tumor at the C5 spinal cord level, but no cord compression or enhancement. 4. Extensive soft tissue nodularity in the visible lower abdominal mesentery and lumbar prevertebral space compatible with disseminated soft tissue or lymph node metastases. Associated small volume lower abdominal and pelvic free fluid. 5. Small posterior paraspinal soft tissue metastasis abutting the C7 spinous process. Small clivus metastasis suspected. Electronically Signed   By: Genevie Ann M.D.   On: 01/03/2018 12:12   Mr Lumbar Spine W Wo Contrast  Result Date: 01/03/2018 CLINICAL DATA:  84 year old male with large right lung mass discovered four days ago. Slurred speech and worsening lower extremity weakness. Lower extremity symptoms not explained by small posterior circulation infarcts seen on MRI 01/01/2018. Spinal and paraspinal metastases in the cervical and thoracic spine demonstrated yesterday without IV contrast. EXAM: MRI CERVICAL SPINE WITH CONTRAST MRI THORACIC SPINE WITH CONTRAST MRI LUMBAR SPINE WITHOUT AND WITH CONTRAST TECHNIQUE: Multisequence MR imaging of the spine from the cervical spine to the sacrum was performed prior to and following IV contrast administration for evaluation of spinal metastatic  disease. CONTRAST:  50m MULTIHANCE GADOBENATE DIMEGLUMINE 529 MG/ML IV SOLN COMPARISON:  Noncontrast cervical and thoracic MRI 01/02/2018. Noncontrast brain MRI 01/01/2018. Chest CT 12/30/2017. FINDINGS: MRI CERVICAL SPINE FINDINGS Alignment: Stable since yesterday with mild reversal of cervical lordosis. Vertebrae: Left vertebral enhancing metastasis with epidural and paraspinal extraosseous extension re-demonstrated at the C5-C6 level on the left. As seen yesterday, the tumor is approximately 3 centimeters in diameter centered at the left C5 pedicle and C6 neural foramen. There is epidural extension of tumor into the left lateral spinal canal at C5 (series 19, image 18). This abuts the lateral left spinal cord without mass effect on the cord. The left C5 and C6 neural foramina are completely invaded by tumor. No other cervical vertebral tumor identified. Cord: No abnormal spinal cord enhancement. Abnormal left lateral epidural space at C5, but no dural thickening. Posterior Fossa, vertebral arteries, paraspinal tissues: There is a 17 millimeter round posterior right paraspinal soft tissue metastasis abutting the C7 spinous process (series 18, image 7 and series 19, image 31. The small clivus lesion is enhancing and highly suspicious for small metastasis. No abnormal enhancement identified in the posterior fossa. Disc levels: Stable since yesterday, no cervical spinal  stenosis. MRI THORACIC SPINE FINDINGS Segmentation: Absent ribs at T12. This is the same numbering system used yesterday. Alignment:  Stable.  Moderate dextroconvex scoliosis. Vertebrae: The ribs at T12 are absent, not involved by tumor or metastasis. No thoracic vertebral tumor is identified. The right lung mass closely abuts the right thoracic spine but there is no foraminal or vertebral invasion identified. Cord: On the sagittal post-contrast images today there is speckled artifact projecting over the T8 and T9 spinal levels. No abnormal spinal  cord enhancement. No thoracic dural thickening or enhancement. Paraspinal and other soft tissues: Stable since yesterday. Disc levels: Stable since yesterday, no thoracic spinal stenosis. MRI LUMBAR SPINE FINDINGS Segmentation:  Normal, confirming absent ribs at T12. Alignment: Preserved lumbar lordosis. Superimposed dextroconvex upper lumbar curvature. Vertebrae: There is a 2 centimeter round enhancing metastasis in the right superior L2 vertebral body (series 5, image 6). No extraosseous extension. No other lumbar metastasis identified. No central sacral metastasis. Possible small metastasis partially visible in the medial right iliac bone on series 8, image 34. Conus medullaris: Extends to the T11-T12 level and was best demonstrated on the thoracic MRI yesterday. The cauda equina nerve roots are non thickened and there is no abnormal lumbar intradural enhancement. No dural thickening. Paraspinal and other soft tissues: There is abnormal prevertebral and mesenteric soft tissue nodules with enhancement in the visible abdomen suggesting widespread metastatic lymphadenopathy or mesenteric tumor. Some of the largest lesions are 15-17 millimeter short axis (series 7, image 25 and 27). Superimposed small volume of pelvic free fluid (series 6, image 1). Negative visualized posterior paraspinal soft tissues. Disc levels: Lumbar spine degeneration, worst at L4-L5 and eccentric to the right. No associated spinal stenosis. There is severe right L4 foraminal stenosis. IMPRESSION: 1. No spinal cord or cauda equina metastasis. No spinal cord or cauda equina stenosis/compression. But there is severe degenerative right L4 neural foraminal stenosis. 2. No thoracic spine metastasis (absent ribs at T12). Isolated small lumbar metastasis in the L2 vertebral body. 3. Re-demonstrated left C5 vertebral and paraspinal metastasis with infiltration of the left C5 and C6 neural foramina. There is left lateral epidural tumor at the C5 spinal  cord level, but no cord compression or enhancement. 4. Extensive soft tissue nodularity in the visible lower abdominal mesentery and lumbar prevertebral space compatible with disseminated soft tissue or lymph node metastases. Associated small volume lower abdominal and pelvic free fluid. 5. Small posterior paraspinal soft tissue metastasis abutting the C7 spinous process. Small clivus metastasis suspected. Electronically Signed   By: Genevie Ann M.D.   On: 01/03/2018 12:12     Medications:  Scheduled: . amoxicillin-clavulanate  1 tablet Oral Q12H  . finasteride  5 mg Oral Daily  . phenytoin  100 mg Oral TID  . sodium chloride flush  3 mL Intravenous Q12H  . warfarin  1 mg Oral ONCE-1800  . Warfarin - Pharmacist Dosing Inpatient   Does not apply q1800   Continuous: . sodium chloride     IRJ:JOACZY chloride, acetaminophen **OR** acetaminophen, HYDROcodone-acetaminophen, hydroxypropyl methylcellulose / hypromellose, ondansetron **OR** ondansetron (ZOFRAN) IV, RESOURCE THICKENUP CLEAR, sodium chloride flush, traZODone  Assessment/Plan:    Metastatic poorly differentiated carcinoma Possibly non-small cell lung cancer.  Patient seen by medical oncology.  Patient has poor performance status.  Patient appears to have very poor prognosis.  This was discussed by oncology with patient's wife.  Hospice referral has been made.  Patient appears to be a candidate for residential hospice as his prognosis for survival  beyond days or weeks is poor.  Oncology has ordered EGFR mutation testing.  Bilateral acute nonhemorrhagic occipital lobe infarct CT head was negative for bleeding.  MRI positive for stroke as mentioned.  EEG did not show any epileptiform activity.  Echocardiogram as above.  Patient seen by neurology.  They recommended continuing warfarin.  However considering his poor prognosis from cancer they could be reason to discontinue warfarin.  I would prefer that this be addressed by patient's oncologist  and hospice providers.  Cervical disc disease with myelopathy MRI cervical and thoracic spine as well as lumbar spine results reviewed.  These results were discussed with the neurology.  It appears that most of these findings are old.  No cord compression noted on MRI.  Considering his poor prognosis from his cancer no further evaluation or management is necessary.  Postobstructive pneumonia with sepsis Patient has been on multiple different antibiotics.  He was changed over to Unasyn.  We will change him to Augmentin and continue for few more days.  Sepsis physiology has improved.  Acute respiratory failure with hypoxia Secondary to pneumonia.  Continue with oxygen as needed.  History of atrial flutter Ablation in the past.  Currently on warfarin with therapeutic INR.  One could argue that warfarin can be discontinued.  However would prefer that this be addressed by hospice providers as well as Dr. Benay Spice in discussions with family.  Chronic diastolic CHF No evidence for CHF exacerbation.  Coag negative staph bacteremia In 1 out of 2 sets.  Likely contaminant.  Repeat cultures were negative.  Hypernatremia Stable.  Acute renal failure Mild increase in creatinine noted.  No further work-up as the patient is being sent to hospice.  DVT Prophylaxis: On warfarin    Code Status: DNR Family Communication: No family at bedside Disposition Plan: Management as outlined above.  Residential hospice placement.    LOS: 5 days   [facility] Pager [phone] 01/04/2018, 11:08 AM  If 7PM-7AM, please contact night-coverage at [email or web address], password [email or web address]

## 2018-01-04 NOTE — Progress Notes (Signed)
1100 -- [facility] [place] RN Visit  Received request from [place], [place] for family interest in [place] with request to transfer today. Chart reviewed. Met with sister Jinny Sanders and had conference call with wife Cecelia to confirm interest and explain services. Family agreeable to transfer today. Eliezer Lofts, LCSW aware. Registration paper work completed. Dr. Orpah Melter to assume care per family request.  Please fax discharge summary to [phone].  RN please call report to [phone].  Please arrange for transport to transfer patient as early in day as possible.  Please call with any hospice related questions or concerns.  Thank you, Margaretmary Eddy, RN, [facility] Liaison [phone]  [place] are on AMION.

## 2018-01-04 NOTE — Progress Notes (Signed)
IP PROGRESS NOTE  Subjective:   Jay Walters appears unchanged.  His wife is not present this morning.  He reports discomfort at the "waist".  Objective: Vital signs in last 24 hours: Blood pressure (!) 120/57, pulse 66, temperature 98 F (36.7 C), temperature source Oral, resp. rate 18, height _0  (1.727 m), weight 145 lb 4.5 oz (65.9 kg), SpO2 98 %.  Intake/Output from previous day: 07/25 0701 - 07/26 0700 In: 1468.4 [P.O.:120; I.V.:1258.4; IV Piggyback:40] Out: 700 [Urine:700]  Physical Exam:  HEENT: No thrush Lungs: Moves air at the anterior chest bilaterally Cardiac: Regular rate and rhythm Abdomen: Nontender, no mass, no hepatomegaly Extremities: No leg edema Neurologic: Alert, not oriented, moves all extremities to command    Lab Results: Recent Labs    01/02/18 0318 01/03/18 1204  WBC 29.1* 30.7*  HGB 12.4* 11.7*  HCT 39.7 37.0*  PLT 194 151    BMET Recent Labs    01/02/18 0318 01/03/18 1204  NA 148* 146*  K 4.2 4.0  CL 119* 119*  CO2 20* 20*  GLUCOSE 113* 116*  BUN 29* 31*  CREATININE 1.09 1.34*  CALCIUM 8.3* 8.1*    No results found for: CEA1  Studies/Results: Jay Walters Wo Contrast  Result Date: 01/02/2018 CLINICAL DATA:  Back pain.  Myelopathy.  Metastatic lung cancer. EXAM: MRI CERVICAL, THORACIC Walters WITHOUT CONTRAST TECHNIQUE: Multiplanar and multiecho pulse sequences of the cervical Walters, to include the craniocervical junction and cervicothoracic junction, and thoracic Walters, were obtained without intravenous contrast. COMPARISON:  CT chest December 30, 2017, MRI cervical Walters May 08, 2011. FINDINGS: MRI CERVICAL Walters FINDINGS ALIGNMENT: Broad reversed cervical lordosis.  No malalignment. VERTEBRAE/DISCS: Abnormal infiltrative low T1, bright STIR signal LEFT C5, C6 articular pillars and lateral vertebral bodies, and adjacent soft tissues. Intact vertebral bodies intact. Severe progressed disc height loss C3-4 through C6-7 with  severe subacute discogenic endplate changes. Heterogeneously bright STIR signal C4-5 disc favoring mineralization. Moderate to severe C3-4 degenerative disc. 8 mm low signal lesion in clivus, potential metastasis. CORD:Multifocal spinal cord myelomalacia at sites of prior cord edema. No syrinx. POSTERIOR FOSSA, VERTEBRAL ARTERIES, PARASPINAL TISSUES: No Jay findings of ligamentous injury. Vertebral artery flow voids present. Multiple paraspinous cystic masses at C7 and T1 measuring to 17 mm these may arrive from the spinous process. DISC LEVELS: C2-3: Uncovertebral hypertrophy. Severe RIGHT and moderate LEFT facet arthropathy. No canal stenosis. Moderate RIGHT neural foraminal narrowing. C3-4: Uncovertebral hypertrophy mild facet arthropathy. No canal stenosis. Mild neural foraminal narrowing. C4-5: Uncovertebral hypertrophy. Mild facet arthropathy. No canal stenosis. Moderate LEFT neural foraminal narrowing with soft tissue component. C5-6: Small broad-based disc osteophyte complex. Abnormal soft tissue effacing LEFT neural foramen. No canal stenosis. Moderate RIGHT neural foraminal narrowing. C6-7: Small broad-based disc osteophyte complex. No canal stenosis. Moderate to severe bilateral neural foraminal narrowing. C7-T1: Uncovertebral hypertrophy. No canal stenosis. Mild LEFT neural foraminal narrowing. MRI THORACIC Walters FINDINGS ALIGNMENT: Maintenance of the thoracic kyphosis. No malalignment. VERTEBRAE/DISCS: Vertebral bodies are intact. Intervertebral discs morphology maintained with mild desiccation. Abnormal signal bilateral T12 ribs compatible with metastasis. Subcentimeter bright STIR signal RIGHT T5 facet. CORD: Thoracic spinal cord is normal morphology and signal characteristics to the level of the conus medullaris which terminates at. PREVERTEBRAL AND PARASPINAL SOFT TISSUES: Abnormal masses within the mediastinum and RIGHT lung consistent with tumor with small RIGHT pleural effusion. DISC LEVELS: No  disc bulge, canal stenosis nor neural foraminal narrowing. IMPRESSION: MRI cervical Walters: 1. LEFT C5 and C6 interosseous  and paraspinal mass consistent with metastatic disease. Paraspinous nodules C7 and T1 concerning for metastatic disease. Potential clival metastasis. No pathologic fracture. Contrast enhanced sequences would better characterize tumor. 2. Multi cervical spinal cord myelomalacia.  No syrinx. 3. Progressed degenerative change of the cervical Walters. No canal stenosis. 4. Neural foraminal narrowing C2-3 through C6-7: Moderate to severe at C6-7. Tumor effacing LEFT C5-6 and to lesser extent LEFT C4-5 neural foramen. MRI thoracic Walters: 1. Bilateral T12 rib osseous metastasis. Extensive mediastinal and RIGHT lung metastasis. RIGHT T5 facet metastasis without pathologic fracture. Contrast enhanced sequences may detected additional metastasis. 2. No canal stenosis or neural foraminal narrowing. Electronically Signed   By: Elon Alas M.D.   On: 01/02/2018 19:06   Jay Walters W Contrast  Result Date: 01/03/2018 CLINICAL DATA:  84 year old male with large right lung mass discovered four days ago. Slurred speech and worsening lower extremity weakness. Lower extremity symptoms not explained by small posterior circulation infarcts seen on MRI 01/01/2018. Spinal and paraspinal metastases in the cervical and thoracic Walters demonstrated yesterday without IV contrast. EXAM: MRI CERVICAL Walters WITH CONTRAST MRI THORACIC Walters WITH CONTRAST MRI LUMBAR Walters WITHOUT AND WITH CONTRAST TECHNIQUE: Multisequence Jay imaging of the Walters from the cervical Walters to the sacrum was performed prior to and following IV contrast administration for evaluation of spinal metastatic disease. CONTRAST:  11m MULTIHANCE GADOBENATE DIMEGLUMINE 529 MG/ML IV SOLN COMPARISON:  Noncontrast cervical and thoracic MRI 01/02/2018. Noncontrast brain MRI 01/01/2018. Chest CT 12/30/2017. FINDINGS: MRI CERVICAL Walters FINDINGS  Alignment: Stable since yesterday with mild reversal of cervical lordosis. Vertebrae: Left vertebral enhancing metastasis with epidural and paraspinal extraosseous extension re-demonstrated at the C5-C6 level on the left. As seen yesterday, the tumor is approximately 3 centimeters in diameter centered at the left C5 pedicle and C6 neural foramen. There is epidural extension of tumor into the left lateral spinal canal at C5 (series 19, image 18). This abuts the lateral left spinal cord without mass effect on the cord. The left C5 and C6 neural foramina are completely invaded by tumor. No other cervical vertebral tumor identified. Cord: No abnormal spinal cord enhancement. Abnormal left lateral epidural space at C5, but no dural thickening. Posterior Fossa, vertebral arteries, paraspinal tissues: There is a 17 millimeter round posterior right paraspinal soft tissue metastasis abutting the C7 spinous process (series 18, image 7 and series 19, image 31. The small clivus lesion is enhancing and highly suspicious for small metastasis. No abnormal enhancement identified in the posterior fossa. Disc levels: Stable since yesterday, no cervical spinal stenosis. MRI THORACIC Walters FINDINGS Segmentation: Absent ribs at T12. This is the same numbering system used yesterday. Alignment:  Stable.  Moderate dextroconvex scoliosis. Vertebrae: The ribs at T12 are absent, not involved by tumor or metastasis. No thoracic vertebral tumor is identified. The right lung mass closely abuts the right thoracic Walters but there is no foraminal or vertebral invasion identified. Cord: On the sagittal post-contrast images today there is speckled artifact projecting over the T8 and T9 spinal levels. No abnormal spinal cord enhancement. No thoracic dural thickening or enhancement. Paraspinal and other soft tissues: Stable since yesterday. Disc levels: Stable since yesterday, no thoracic spinal stenosis. MRI LUMBAR Walters FINDINGS Segmentation:   Normal, confirming absent ribs at T12. Alignment: Preserved lumbar lordosis. Superimposed dextroconvex upper lumbar curvature. Vertebrae: There is a 2 centimeter round enhancing metastasis in the right superior L2 vertebral body (series 5, image 6). No extraosseous extension. No other lumbar metastasis identified. No  central sacral metastasis. Possible small metastasis partially visible in the medial right iliac bone on series 8, image 34. Conus medullaris: Extends to the T11-T12 level and was best demonstrated on the thoracic MRI yesterday. The cauda equina nerve roots are non thickened and there is no abnormal lumbar intradural enhancement. No dural thickening. Paraspinal and other soft tissues: There is abnormal prevertebral and mesenteric soft tissue nodules with enhancement in the visible abdomen suggesting widespread metastatic lymphadenopathy or mesenteric tumor. Some of the largest lesions are 15-17 millimeter short axis (series 7, image 25 and 27). Superimposed small volume of pelvic free fluid (series 6, image 1). Negative visualized posterior paraspinal soft tissues. Disc levels: Lumbar Walters degeneration, worst at L4-L5 and eccentric to the right. No associated spinal stenosis. There is severe right L4 foraminal stenosis. IMPRESSION: 1. No spinal cord or cauda equina metastasis. No spinal cord or cauda equina stenosis/compression. But there is severe degenerative right L4 neural foraminal stenosis. 2. No thoracic Walters metastasis (absent ribs at T12). Isolated small lumbar metastasis in the L2 vertebral body. 3. Re-demonstrated left C5 vertebral and paraspinal metastasis with infiltration of the left C5 and C6 neural foramina. There is left lateral epidural tumor at the C5 spinal cord level, but no cord compression or enhancement. 4. Extensive soft tissue nodularity in the visible lower abdominal mesentery and lumbar prevertebral space compatible with disseminated soft tissue or lymph node metastases.  Associated small volume lower abdominal and pelvic free fluid. 5. Small posterior paraspinal soft tissue metastasis abutting the C7 spinous process. Small clivus metastasis suspected. Electronically Signed   By: Genevie Ann M.D.   On: 01/03/2018 12:12   Jay Walters Wo Contrast  Result Date: 01/02/2018 CLINICAL DATA:  Back pain.  Myelopathy.  Metastatic lung cancer. EXAM: MRI CERVICAL, THORACIC Walters WITHOUT CONTRAST TECHNIQUE: Multiplanar and multiecho pulse sequences of the cervical Walters, to include the craniocervical junction and cervicothoracic junction, and thoracic Walters, were obtained without intravenous contrast. COMPARISON:  CT chest December 30, 2017, MRI cervical Walters May 08, 2011. FINDINGS: MRI CERVICAL Walters FINDINGS ALIGNMENT: Broad reversed cervical lordosis.  No malalignment. VERTEBRAE/DISCS: Abnormal infiltrative low T1, bright STIR signal LEFT C5, C6 articular pillars and lateral vertebral bodies, and adjacent soft tissues. Intact vertebral bodies intact. Severe progressed disc height loss C3-4 through C6-7 with severe subacute discogenic endplate changes. Heterogeneously bright STIR signal C4-5 disc favoring mineralization. Moderate to severe C3-4 degenerative disc. 8 mm low signal lesion in clivus, potential metastasis. CORD:Multifocal spinal cord myelomalacia at sites of prior cord edema. No syrinx. POSTERIOR FOSSA, VERTEBRAL ARTERIES, PARASPINAL TISSUES: No Jay findings of ligamentous injury. Vertebral artery flow voids present. Multiple paraspinous cystic masses at C7 and T1 measuring to 17 mm these may arrive from the spinous process. DISC LEVELS: C2-3: Uncovertebral hypertrophy. Severe RIGHT and moderate LEFT facet arthropathy. No canal stenosis. Moderate RIGHT neural foraminal narrowing. C3-4: Uncovertebral hypertrophy mild facet arthropathy. No canal stenosis. Mild neural foraminal narrowing. C4-5: Uncovertebral hypertrophy. Mild facet arthropathy. No canal stenosis. Moderate LEFT  neural foraminal narrowing with soft tissue component. C5-6: Small broad-based disc osteophyte complex. Abnormal soft tissue effacing LEFT neural foramen. No canal stenosis. Moderate RIGHT neural foraminal narrowing. C6-7: Small broad-based disc osteophyte complex. No canal stenosis. Moderate to severe bilateral neural foraminal narrowing. C7-T1: Uncovertebral hypertrophy. No canal stenosis. Mild LEFT neural foraminal narrowing. MRI THORACIC Walters FINDINGS ALIGNMENT: Maintenance of the thoracic kyphosis. No malalignment. VERTEBRAE/DISCS: Vertebral bodies are intact. Intervertebral discs morphology maintained with mild desiccation. Abnormal signal bilateral  T12 ribs compatible with metastasis. Subcentimeter bright STIR signal RIGHT T5 facet. CORD: Thoracic spinal cord is normal morphology and signal characteristics to the level of the conus medullaris which terminates at. PREVERTEBRAL AND PARASPINAL SOFT TISSUES: Abnormal masses within the mediastinum and RIGHT lung consistent with tumor with small RIGHT pleural effusion. DISC LEVELS: No disc bulge, canal stenosis nor neural foraminal narrowing. IMPRESSION: MRI cervical Walters: 1. LEFT C5 and C6 interosseous and paraspinal mass consistent with metastatic disease. Paraspinous nodules C7 and T1 concerning for metastatic disease. Potential clival metastasis. No pathologic fracture. Contrast enhanced sequences would better characterize tumor. 2. Multi cervical spinal cord myelomalacia.  No syrinx. 3. Progressed degenerative change of the cervical Walters. No canal stenosis. 4. Neural foraminal narrowing C2-3 through C6-7: Moderate to severe at C6-7. Tumor effacing LEFT C5-6 and to lesser extent LEFT C4-5 neural foramen. MRI thoracic Walters: 1. Bilateral T12 rib osseous metastasis. Extensive mediastinal and RIGHT lung metastasis. RIGHT T5 facet metastasis without pathologic fracture. Contrast enhanced sequences may detected additional metastasis. 2. No canal stenosis or  neural foraminal narrowing. Electronically Signed   By: Elon Alas M.D.   On: 01/02/2018 19:06   Jay Walters W Contrast  Result Date: 01/03/2018 CLINICAL DATA:  84 year old male with large right lung mass discovered four days ago. Slurred speech and worsening lower extremity weakness. Lower extremity symptoms not explained by small posterior circulation infarcts seen on MRI 01/01/2018. Spinal and paraspinal metastases in the cervical and thoracic Walters demonstrated yesterday without IV contrast. EXAM: MRI CERVICAL Walters WITH CONTRAST MRI THORACIC Walters WITH CONTRAST MRI LUMBAR Walters WITHOUT AND WITH CONTRAST TECHNIQUE: Multisequence Jay imaging of the Walters from the cervical Walters to the sacrum was performed prior to and following IV contrast administration for evaluation of spinal metastatic disease. CONTRAST:  8m MULTIHANCE GADOBENATE DIMEGLUMINE 529 MG/ML IV SOLN COMPARISON:  Noncontrast cervical and thoracic MRI 01/02/2018. Noncontrast brain MRI 01/01/2018. Chest CT 12/30/2017. FINDINGS: MRI CERVICAL Walters FINDINGS Alignment: Stable since yesterday with mild reversal of cervical lordosis. Vertebrae: Left vertebral enhancing metastasis with epidural and paraspinal extraosseous extension re-demonstrated at the C5-C6 level on the left. As seen yesterday, the tumor is approximately 3 centimeters in diameter centered at the left C5 pedicle and C6 neural foramen. There is epidural extension of tumor into the left lateral spinal canal at C5 (series 19, image 18). This abuts the lateral left spinal cord without mass effect on the cord. The left C5 and C6 neural foramina are completely invaded by tumor. No other cervical vertebral tumor identified. Cord: No abnormal spinal cord enhancement. Abnormal left lateral epidural space at C5, but no dural thickening. Posterior Fossa, vertebral arteries, paraspinal tissues: There is a 17 millimeter round posterior right paraspinal soft tissue metastasis abutting  the C7 spinous process (series 18, image 7 and series 19, image 31. The small clivus lesion is enhancing and highly suspicious for small metastasis. No abnormal enhancement identified in the posterior fossa. Disc levels: Stable since yesterday, no cervical spinal stenosis. MRI THORACIC Walters FINDINGS Segmentation: Absent ribs at T12. This is the same numbering system used yesterday. Alignment:  Stable.  Moderate dextroconvex scoliosis. Vertebrae: The ribs at T12 are absent, not involved by tumor or metastasis. No thoracic vertebral tumor is identified. The right lung mass closely abuts the right thoracic Walters but there is no foraminal or vertebral invasion identified. Cord: On the sagittal post-contrast images today there is speckled artifact projecting over the T8 and T9 spinal levels. No abnormal spinal cord  enhancement. No thoracic dural thickening or enhancement. Paraspinal and other soft tissues: Stable since yesterday. Disc levels: Stable since yesterday, no thoracic spinal stenosis. MRI LUMBAR Walters FINDINGS Segmentation:  Normal, confirming absent ribs at T12. Alignment: Preserved lumbar lordosis. Superimposed dextroconvex upper lumbar curvature. Vertebrae: There is a 2 centimeter round enhancing metastasis in the right superior L2 vertebral body (series 5, image 6). No extraosseous extension. No other lumbar metastasis identified. No central sacral metastasis. Possible small metastasis partially visible in the medial right iliac bone on series 8, image 34. Conus medullaris: Extends to the T11-T12 level and was best demonstrated on the thoracic MRI yesterday. The cauda equina nerve roots are non thickened and there is no abnormal lumbar intradural enhancement. No dural thickening. Paraspinal and other soft tissues: There is abnormal prevertebral and mesenteric soft tissue nodules with enhancement in the visible abdomen suggesting widespread metastatic lymphadenopathy or mesenteric tumor. Some of the largest  lesions are 15-17 millimeter short axis (series 7, image 25 and 27). Superimposed small volume of pelvic free fluid (series 6, image 1). Negative visualized posterior paraspinal soft tissues. Disc levels: Lumbar Walters degeneration, worst at L4-L5 and eccentric to the right. No associated spinal stenosis. There is severe right L4 foraminal stenosis. IMPRESSION: 1. No spinal cord or cauda equina metastasis. No spinal cord or cauda equina stenosis/compression. But there is severe degenerative right L4 neural foraminal stenosis. 2. No thoracic Walters metastasis (absent ribs at T12). Isolated small lumbar metastasis in the L2 vertebral body. 3. Re-demonstrated left C5 vertebral and paraspinal metastasis with infiltration of the left C5 and C6 neural foramina. There is left lateral epidural tumor at the C5 spinal cord level, but no cord compression or enhancement. 4. Extensive soft tissue nodularity in the visible lower abdominal mesentery and lumbar prevertebral space compatible with disseminated soft tissue or lymph node metastases. Associated small volume lower abdominal and pelvic free fluid. 5. Small posterior paraspinal soft tissue metastasis abutting the C7 spinous process. Small clivus metastasis suspected. Electronically Signed   By: Genevie Ann M.D.   On: 01/03/2018 12:12   Jay Walters W Wo Contrast  Result Date: 01/03/2018 CLINICAL DATA:  84 year old male with large right lung mass discovered four days ago. Slurred speech and worsening lower extremity weakness. Lower extremity symptoms not explained by small posterior circulation infarcts seen on MRI 01/01/2018. Spinal and paraspinal metastases in the cervical and thoracic Walters demonstrated yesterday without IV contrast. EXAM: MRI CERVICAL Walters WITH CONTRAST MRI THORACIC Walters WITH CONTRAST MRI LUMBAR Walters WITHOUT AND WITH CONTRAST TECHNIQUE: Multisequence Jay imaging of the Walters from the cervical Walters to the sacrum was performed prior to and following  IV contrast administration for evaluation of spinal metastatic disease. CONTRAST:  98m MULTIHANCE GADOBENATE DIMEGLUMINE 529 MG/ML IV SOLN COMPARISON:  Noncontrast cervical and thoracic MRI 01/02/2018. Noncontrast brain MRI 01/01/2018. Chest CT 12/30/2017. FINDINGS: MRI CERVICAL Walters FINDINGS Alignment: Stable since yesterday with mild reversal of cervical lordosis. Vertebrae: Left vertebral enhancing metastasis with epidural and paraspinal extraosseous extension re-demonstrated at the C5-C6 level on the left. As seen yesterday, the tumor is approximately 3 centimeters in diameter centered at the left C5 pedicle and C6 neural foramen. There is epidural extension of tumor into the left lateral spinal canal at C5 (series 19, image 18). This abuts the lateral left spinal cord without mass effect on the cord. The left C5 and C6 neural foramina are completely invaded by tumor. No other cervical vertebral tumor identified. Cord: No abnormal spinal cord  enhancement. Abnormal left lateral epidural space at C5, but no dural thickening. Posterior Fossa, vertebral arteries, paraspinal tissues: There is a 17 millimeter round posterior right paraspinal soft tissue metastasis abutting the C7 spinous process (series 18, image 7 and series 19, image 31. The small clivus lesion is enhancing and highly suspicious for small metastasis. No abnormal enhancement identified in the posterior fossa. Disc levels: Stable since yesterday, no cervical spinal stenosis. MRI THORACIC Walters FINDINGS Segmentation: Absent ribs at T12. This is the same numbering system used yesterday. Alignment:  Stable.  Moderate dextroconvex scoliosis. Vertebrae: The ribs at T12 are absent, not involved by tumor or metastasis. No thoracic vertebral tumor is identified. The right lung mass closely abuts the right thoracic Walters but there is no foraminal or vertebral invasion identified. Cord: On the sagittal post-contrast images today there is speckled artifact  projecting over the T8 and T9 spinal levels. No abnormal spinal cord enhancement. No thoracic dural thickening or enhancement. Paraspinal and other soft tissues: Stable since yesterday. Disc levels: Stable since yesterday, no thoracic spinal stenosis. MRI LUMBAR Walters FINDINGS Segmentation:  Normal, confirming absent ribs at T12. Alignment: Preserved lumbar lordosis. Superimposed dextroconvex upper lumbar curvature. Vertebrae: There is a 2 centimeter round enhancing metastasis in the right superior L2 vertebral body (series 5, image 6). No extraosseous extension. No other lumbar metastasis identified. No central sacral metastasis. Possible small metastasis partially visible in the medial right iliac bone on series 8, image 34. Conus medullaris: Extends to the T11-T12 level and was best demonstrated on the thoracic MRI yesterday. The cauda equina nerve roots are non thickened and there is no abnormal lumbar intradural enhancement. No dural thickening. Paraspinal and other soft tissues: There is abnormal prevertebral and mesenteric soft tissue nodules with enhancement in the visible abdomen suggesting widespread metastatic lymphadenopathy or mesenteric tumor. Some of the largest lesions are 15-17 millimeter short axis (series 7, image 25 and 27). Superimposed small volume of pelvic free fluid (series 6, image 1). Negative visualized posterior paraspinal soft tissues. Disc levels: Lumbar Walters degeneration, worst at L4-L5 and eccentric to the right. No associated spinal stenosis. There is severe right L4 foraminal stenosis. IMPRESSION: 1. No spinal cord or cauda equina metastasis. No spinal cord or cauda equina stenosis/compression. But there is severe degenerative right L4 neural foraminal stenosis. 2. No thoracic Walters metastasis (absent ribs at T12). Isolated small lumbar metastasis in the L2 vertebral body. 3. Re-demonstrated left C5 vertebral and paraspinal metastasis with infiltration of the left C5 and C6 neural  foramina. There is left lateral epidural tumor at the C5 spinal cord level, but no cord compression or enhancement. 4. Extensive soft tissue nodularity in the visible lower abdominal mesentery and lumbar prevertebral space compatible with disseminated soft tissue or lymph node metastases. Associated small volume lower abdominal and pelvic free fluid. 5. Small posterior paraspinal soft tissue metastasis abutting the C7 spinous process. Small clivus metastasis suspected. Electronically Signed   By: Genevie Ann M.D.   On: 01/03/2018 12:12    Medications: I have reviewed the patient's current medications.  Assessment/Plan: 1.  Metastatic poorly differentiated carcinoma- clinical presentation/pathology consistent with non-small cell lung cancer  CT chest 12/30/2017-right upper lobe mass, right upper lobe collapse, small right pleural effusion, mediastinal/right hilar adenopathy, bilateral pulmonary nodules, mesenteric/retroperitoneal/peritoneal masses, bilateral adrenal masses, nodular wall thickening of the gallbladder  Ultrasound-guided biopsy of a left upper quadrant omental mass 01/01/2018-poorly differentiated carcinoma consistent with adenocarcinoma of the lung  MRI cervical and thoracic [facility] 01/02/2018-  left C5 and C6 intraosseous/paraspinal mass, tumor effacing left C5-6 and left C4-5 neural foramen, paraspinous nodules at C7 and T1, clivus metastasis, right T5 facet metastasis  MRI lumbar [facility] 01/03/2018- L2 vertebral body metastasis, no thickening of the cauda equina nerve roots, no abnormal lumbar intradural enhancement  2.  Altered mental status  3.  Leg weakness- likely multifactorial, no evidence of cord compression  4.  Pain-likely secondary to metastatic disease involving the bones and paraspinous soft tissues  5.  Bilateral occipital subcortical infarcts noted on MRI 01/01/2018  6.  Atrial flutter  7.  History of a CVA  8.  Malnutrition  Jay. Haymer appears unchanged.  He has  a poor performance status.  He is not a candidate for a trial of systemic chemotherapy.  I recommend hospice care and transfer to [facility].  His medical regimen can be consolidated for comfort.  I would consider discontinuing Coumadin secondary to his poor prognosis.  I will follow-up on the EGFR testing and plan to see him at [facility] next week.    LOS: 5 days   Betsy Coder, MD   01/04/2018, 2:07 PM

## 2018-01-04 NOTE — Discharge Summary (Signed)
[facility]  Physician Discharge Summary   Patient ID: Jay Walters MRN: [id number] DOB/AGE: 82/19/1933 82 y.o.  Admit date: 12/30/2017 Discharge date: 01/04/2018  PCP: Elby Showers, MD  DISCHARGE DIAGNOSES:  Metastatic cancer likely non-small cell lung cancer Acute stroke   RECOMMENDATIONS FOR OUTPATIENT FOLLOW UP: Patient being discharged to residential hospice Consider discontinuing warfarin depending on discussions with oncology and patient's wife  DISCHARGE CONDITION: fair  Diet recommendation: Dysphagia 1 diet with nectar thick liquids  Filed Weights   12/30/17 0845 01/01/18 0500 01/03/18 0500  Weight: 60.3 kg (133 lb) 65.4 kg (144 lb 2.9 oz) 65.9 kg (145 lb 4.5 oz)    INITIAL HISTORY: 84 y.o.malewith medical history significant ofpretension, stroke, chronic back pain regard to cervical disc and lumbar disc myelopathy atrial fibrillation and flutter now in presents the emergency department with complaints of cough, weakness, shortness of breath and fever for approximately 1 week. In the emergency department his white blood cell count was noted to be 22,000, his lactate is elevated at 2 his BNP is also elevated. He received 2 L of fluid boluses in the emergency department as well as ceftriaxone and azithromycin. His chest x-ray showed significant abnormalities in the right upper lobe with a severe consolidation and possible pleural fluid. 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 Conversation with his wife she reports that the patient has recently seen his neurosurgeon due to pain across his shoulders and back that was thought due to his cervical disease. He received steroid injections in his shoulder with no relief. [facility] were asked to admit. Admitted with lung  mass, leukocytosis, possible post obstructive PNA. Develops slurred speech and worsening weakness lower extremities. He has chronic back pain and some myelopathy. He was found to have bilateral occipital stroke, incidental finding. Stroke doesn't correlate with Lower extremities weakness. Cervical , thoracic MRI ordered which showed cervical and para spinal mass consistent with metastasis diseases.   Reason for Visit: Metastatic cancer likely non-small cell lung cancer  Consultants: Pulmonology.  Neurology.  Medical oncology.  Procedures:   EEG This is an abnormal EEG secondary to posterior background slowing. This finding may be seen with a diffuse gray matter disturbance that is etiologically nonspecific, but may include a dementia, among other possibilities. No epileptiform activity is noted.  US Guided biopsy of the omental mass 7/23  Transthoracic echocardiogram Study Conclusions  - Left ventricle: The cavity size was normal. Systolic function was vigorous. The estimated ejection fraction was in the range of 65% to 70%. Wall motion was normal; there were no regional wall motion abnormalities. Doppler parameters are consistent with abnormal left ventricular relaxation (grade 1 diastolic dysfunction). - Aortic valve: There was trivial regurgitation. - Left atrium: The atrium was mildly dilated. - Right ventricle: The cavity size was mildly dilated. Wall thickness was normal. - Right atrium: The atrium was mildly dilated.  Antibiotics: Ceftriaxone 7-21--7-23 Azithromycin 7-21--7/26 Vancomycin 7-23--7/25 Zosyn 7-23--7/25 Unasyn 7/25-7/26 Augmentin 7/26   HOSPITAL COURSE:   Metastatic poorly differentiated carcinoma Possibly non-small cell lung cancer.  Patient seen by medical oncology.  Patient has poor performance status.  Patient appears to have very poor prognosis.  This was discussed by oncology with patient's wife.  Patient appears to be a  candidate for residential hospice as his prognosis for survival beyond days or weeks  is poor.  Oncology has ordered EGFR mutation testing.  Bilateral acute nonhemorrhagic occipital lobe infarct CT head was negative for bleeding.  MRI positive for stroke as mentioned.  EEG did not show any epileptiform activity.  Echocardiogram as above.  Patient seen by neurology.  They recommended continuing warfarin.  However considering his poor prognosis from cancer there could be reason to discontinue warfarin.  I would prefer that this be addressed by patient's oncologist and hospice providers at residential hospice.  Cervical disc disease with myelopathy MRI cervical and thoracic spine as well as lumbar spine results reviewed.  These results were discussed with the neurology.  It appears that most of these findings are old.  No cord compression noted on MRI.  Considering his poor prognosis from his cancer no further evaluation or management is necessary.  Postobstructive pneumonia with sepsis Patient has been on multiple different antibiotics.  He was changed over to Unasyn.  We will change him to Augmentin and continue for few more days.  Sepsis physiology has improved.  Acute respiratory failure with hypoxia Secondary to pneumonia.  Continue with oxygen as needed to keep saturations above 90%.  History of atrial flutter Ablation in the past.  Currently on warfarin with therapeutic INR.  One could argue that warfarin can be discontinued.  However would prefer that this be addressed by hospice providers as well as Dr. Benay Spice in discussions with family.  Chronic diastolic CHF No evidence for CHF exacerbation.  Coag negative staph bacteremia In 1 out of 2 sets.  Likely contaminant.  Repeat cultures were negative.  Hypernatremia Stable.  Acute renal failure Mild increase in creatinine noted.  No further work-up as the patient is being sent to hospice.  Patient's prognosis is poor due to  metastatic cancer.  He is being sent to residential hospice.    PERTINENT LABS:  The results of significant diagnostics from this hospitalization (including imaging, microbiology, ancillary and laboratory) are listed below for reference.    Microbiology: Recent Results (from the past 240 hour(s))  Blood Culture (routine x 2)     Status: Abnormal   Collection Time: 12/30/17  7:55 AM  Result Value Ref Range Status   Specimen Description BLOOD RIGHT HAND  Final   Special Requests   Final    BOTTLES DRAWN AEROBIC AND ANAEROBIC Blood Culture adequate volume   Culture  Setup Time   Final    GRAM POSITIVE COCCI AEROBIC BOTTLE ONLY CRITICAL RESULT CALLED TO, READ BACK BY AND VERIFIED WITH: PHARMD M TURNER [id number] MLM    Culture (A)  Final    STAPHYLOCOCCUS SPECIES (COAGULASE NEGATIVE) THE SIGNIFICANCE OF ISOLATING THIS ORGANISM FROM A SINGLE SET OF BLOOD CULTURES WHEN MULTIPLE SETS ARE DRAWN IS UNCERTAIN. PLEASE NOTIFY THE MICROBIOLOGY DEPARTMENT WITHIN ONE WEEK IF SPECIATION AND SENSITIVITIES ARE REQUIRED. Performed at [facility], [place] [address], [place], [place] [address]    Report Status 01/02/2018 FINAL  Final  Blood Culture ID Panel (Reflexed)     Status: Abnormal   Collection Time: 12/30/17  7:55 AM  Result Value Ref Range Status   Enterococcus species NOT DETECTED NOT DETECTED Final   Listeria monocytogenes NOT DETECTED NOT DETECTED Final   Staphylococcus species DETECTED (A) NOT DETECTED Final    Comment: Methicillin (oxacillin) susceptible coagulase negative staphylococcus. Possible blood culture contaminant (unless isolated from more than one blood culture draw or clinical case suggests pathogenicity). No antibiotic treatment is indicated for blood  culture contaminants. CRITICAL RESULT  CALLED TO, READ BACK BY AND VERIFIED WITH: PHARMD M TURNER [id number] MLM    Staphylococcus aureus NOT DETECTED NOT DETECTED Final   Methicillin resistance NOT DETECTED NOT  DETECTED Final   Streptococcus species NOT DETECTED NOT DETECTED Final   Streptococcus agalactiae NOT DETECTED NOT DETECTED Final   Streptococcus pneumoniae NOT DETECTED NOT DETECTED Final   Streptococcus pyogenes NOT DETECTED NOT DETECTED Final   Acinetobacter baumannii NOT DETECTED NOT DETECTED Final   Enterobacteriaceae species NOT DETECTED NOT DETECTED Final   Enterobacter cloacae complex NOT DETECTED NOT DETECTED Final   Escherichia coli NOT DETECTED NOT DETECTED Final   Klebsiella oxytoca NOT DETECTED NOT DETECTED Final   Klebsiella pneumoniae NOT DETECTED NOT DETECTED Final   Proteus species NOT DETECTED NOT DETECTED Final   Serratia marcescens NOT DETECTED NOT DETECTED Final   Haemophilus influenzae NOT DETECTED NOT DETECTED Final   Neisseria meningitidis NOT DETECTED NOT DETECTED Final   Pseudomonas aeruginosa NOT DETECTED NOT DETECTED Final   Candida albicans NOT DETECTED NOT DETECTED Final   Candida glabrata NOT DETECTED NOT DETECTED Final   Candida krusei NOT DETECTED NOT DETECTED Final   Candida parapsilosis NOT DETECTED NOT DETECTED Final   Candida tropicalis NOT DETECTED NOT DETECTED Final    Comment: Performed at [facility], [address] [place], [place] [address]  Blood Culture (routine x 2)     Status: None   Collection Time: 12/30/17  8:00 AM  Result Value Ref Range Status   Specimen Description BLOOD LEFT ANTECUBITAL  Final   Special Requests   Final    BOTTLES DRAWN AEROBIC AND ANAEROBIC Blood Culture adequate volume   Culture   Final    NO GROWTH 5 DAYS Performed at [facility], [place] [address] [place], [place] [address]    Report Status 01/04/2018 FINAL  Final  Culture, blood (routine x 2)     Status: None (Preliminary result)   Collection Time: 01/01/18  4:40 PM  Result Value Ref Range Status   Specimen Description BLOOD RIGHT ARM  Final   Special Requests   Final    BOTTLES DRAWN AEROBIC ONLY Blood Culture adequate volume   Culture    Final    NO GROWTH 3 DAYS Performed at [facility], [address], [place], [place] [address]    Report Status PENDING  Incomplete  Culture, blood (routine x 2)     Status: None (Preliminary result)   Collection Time: 01/01/18  4:45 PM  Result Value Ref Range Status   Specimen Description BLOOD RIGHT HAND  Final   Special Requests   Final    BOTTLES DRAWN AEROBIC ONLY Blood Culture adequate volume   Culture   Final    NO GROWTH 3 DAYS Performed at [facility], [place] [address], [place], [place] [address]    Report Status PENDING  Incomplete  MRSA PCR Screening     Status: None   Collection Time: 01/03/18 12:25 PM  Result Value Ref Range Status   MRSA by PCR NEGATIVE NEGATIVE Final    Comment:        The GeneXpert MRSA Assay (FDA approved for NASAL specimens only), is one component of a comprehensive MRSA colonization surveillance program. It is not intended to diagnose MRSA infection nor to guide or monitor treatment for MRSA infections. Performed at [facility], [place] [address], [place], [place] [address]      Labs: Basic Metabolic  Panel: Recent Labs  Lab 12/30/17 0659 12/31/17 0312 01/01/18 0803 01/02/18 0318 01/03/18 1204  NA 143 142 144 148* 146*  K 3.7 3.6 4.3 4.2 4.0  CL 110 112* 116* 119* 119*  CO2 20* 22 19* 20* 20*  GLUCOSE 117* 142* 105* 113* 116*  BUN 28* 28* 27* 29* 31*  CREATININE 1.23 0.97 0.90 1.09 1.34*  CALCIUM 8.2* 8.3* 8.1* 8.3* 8.1*   Liver Function Tests: Recent Labs  Lab 12/30/17 0659 01/02/18 0318  AST 16  --   ALT 15  --   ALKPHOS 67  --   BILITOT 0.9  --   PROT 5.2*  --   ALBUMIN 2.1* 1.9*    Recent Labs  Lab 01/01/18 1113  AMMONIA 21   CBC: Recent Labs  Lab 12/30/17 0659 12/31/17 0312 01/01/18 0803 01/02/18 0318 01/03/18 1204  WBC 22.9* 24.6* 28.7* 29.1* 30.7*  NEUTROABS 18.0*  --   --   --   --   HGB 13.2 12.4* 12.4* 12.4* 11.7*  HCT 41.7 38.1* 39.3 39.7 37.0*  MCV 96.5 94.1 97.5 98.3 96.4    PLT 287 232 227 194 151   Cardiac Enzymes: Recent Labs  Lab 01/01/18 2132  CKTOTAL 107   BNP: BNP (last 3 results) Recent Labs    10/25/17 1707 12/30/17 0659  BNP 65 150.0*     CBG: Recent Labs  Lab 01/01/18 0742 01/02/18 0739 01/03/18 0744 01/04/18 0744  GLUCAP 115* 100* 104* 99     IMAGING STUDIES Dg Chest 2 View  Result Date: 12/30/2017 CLINICAL DATA:  Shortness of breath several weeks. EXAM: CHEST - 2 VIEW COMPARISON:  02/02/2015 FINDINGS: New confluent consolidation over the right upper lobe with suggestion of pleural fluid over the right apex. Mild tracheal deviation to the right. Left lung is clear. Cardiac silhouette is within normal. Remainder the exam is unchanged. IMPRESSION: New right upper lobe collapse with possible loculated right apical pleural fluid. Recommend chest CT with contrast for further evaluation to exclude central obstructing lesion. Electronically Signed   By: Marin Olp M.D.   On: 12/30/2017 07:42   Ct Head Wo Contrast  Result Date: 01/01/2018 CLINICAL DATA:  Speech difficulties.  History of stroke. EXAM: CT HEAD WITHOUT CONTRAST TECHNIQUE: Contiguous axial images were obtained from the base of the skull through the vertex without intravenous contrast. COMPARISON:  04/28/2014 FINDINGS: Brain: Diffuse cerebral atrophy. Ventricular dilatation consistent with central atrophy. Cavum septum pellucidum. Low-attenuation changes throughout the deep white matter consistent with small vessel ischemia. No mass-effect or midline shift. No abnormal extra-axial fluid collections. Gray-white matter junctions are distinct. Basal cisterns are not effaced. No acute intracranial hemorrhage. Vascular: Intracranial arterial vascular calcifications are present. Skull: Calvarium appears intact. Sinuses/Orbits: Paranasal sinuses and mastoid air cells are clear. Other: None. IMPRESSION: No acute intracranial abnormalities. Chronic atrophy and small vessel ischemic  changes. Electronically Signed   By: Lucienne Capers M.D.   On: 01/01/2018 02:03   Ct Chest W Contrast  Result Date: 12/30/2017 CLINICAL DATA:  Chest pain and shortness-of-breath. EXAM: CT CHEST WITH CONTRAST TECHNIQUE: Multidetector CT imaging of the chest was performed during intravenous contrast administration. CONTRAST:  22m OMNIPAQUE IOHEXOL 300 MG/ML  SOLN COMPARISON:  Chest x-ray today as well as chest CT 04/16/2012 FINDINGS: Cardiovascular: Heart is normal size. Calcified plaque over the left anterior descending coronary artery. Minimal calcified plaque over the thoracic aorta. Prominence of the main pulmonary arteries. No evidence of pulmonary embolism. Mild compression of the SVC  due to the right upper lobe lung mass as well as mediastinal adenopathy. Right upper lobe pulmonary artery is also mildly compressed by the adjacent adenopathy and right upper lobe mass. Mediastinum/Nodes: Extensive mediastinal adenopathy. Right superior mediastinal lymph node measures 2.6 cm by short axis immediately below the thyroid gland. Large right paratracheal/pretracheal lymph node measures 3.5 cm by short axis. Subcarinal lymph node measures 2.6 cm by short axis. Right hilar adenopathy appears to be confluent with the right upper lobe mass. Small lymph nodes along the right pericardiophrenic region and adjacent the distal esophagus. Lungs/Pleura: Examination demonstrates complete collapse of the right upper lobe with obstructed right upper lobe bronchus. Findings suggest a large right upper lobe lung mass with borders difficult to define, but measuring approximately 6.1 x 8.2 cm with minimal mass effect on the adjacent SVC and right upper lobe pulmonary artery. This favors primary bronchogenic carcinoma. This mass abuts the suprahilar region. There is loculated fluid over the right upper lobe/apex. There are numerous bilateral pulmonary nodules compatible with metastatic disease. Small amount of pleural fluid in the  right base. Upper Abdomen: Numerous mesenteric, retroperitoneal and peritoneal masses compatible metastatic disease with the largest over the left upper quadrant measuring 6.4 x 7.7 cm. Bilateral adrenal masses likely metastatic disease. Somewhat nodular wall thickening of the gallbladder as cannot exclude metastatic disease. Minimal calcified plaque over the abdominal aorta. Musculoskeletal: Curvature of the thoracic spine convex right. Degenerative change of the spine. IMPRESSION: Large right upper lobe mass extending towards the suprahilar region measuring approximately 6.1 x 8.2 cm concerning for primary bronchogenic carcinoma. This causes obstruction of the right upper lobe bronchus with mild mass effect on the SVC and right upper lobe pulmonary artery. Small associated right pleural effusion. Extensive associated mediastinal and right hilar adenopathy. Numerous bilateral pulmonary nodules compatible with metastatic disease. Numerous mesenteric, retroperitoneal and peritoneal nodules/masses compatible with metastatic disease with the largest over the left upper quadrant measuring 7.7 cm. Bilateral adrenal masses likely metastatic disease. Nodular wall thickening of the gallbladder suggesting metastatic disease. Minimal atherosclerotic coronary artery disease. Aortic Atherosclerosis (ICD10-I70.0). Electronically Signed   By: Marin Olp M.D.   On: 12/30/2017 10:14   Mr Brain Wo Contrast  Result Date: 01/01/2018 CLINICAL DATA:  Slurred speech and dizziness. Encephalopathy. Altered level of consciousness. EXAM: MRI HEAD WITHOUT CONTRAST TECHNIQUE: Multiplanar, multiecho pulse sequences of the brain and surrounding structures were obtained without intravenous contrast. COMPARISON:  CT head without contrast 01/01/2018. MRI brain 07/29/2012. FINDINGS: Brain: A focal cortical infarct is present the margin of the right occipital and parietal lobe. This infarct measures up to 17 mm. A punctate left occipital lobe  white matter infarct is present. T2 signal change is associated with both areas of acute infarct. There is no acute hemorrhage. Advanced atrophy and confluent white matter disease demonstrate some progression since 2014. Cavum septum pellucidum is incidentally noted. Dilated perivascular spaces are present. The internal auditory canals are within normal limits. The brainstem and cerebellum are normal. Remote infarcts are noted along the corpus callosum. Vascular: Flow is present in the major intracranial arteries. Skull and upper cervical spine: Skull base is within normal limits. Craniocervical junction is normal. Grade 1 anterolisthesis is present at C2-3. Chronic endplate changes are present at C3-4. The central canal is patent. Sinuses/Orbits: The paranasal sinuses and mastoid air cells are clear. Bilateral lens replacements are present. Globes and orbits are otherwise within normal limits. IMPRESSION: 1. Bilateral acute nonhemorrhagic occipital lobe infarcts, right greater than left. There  is cortical involvement on the right measuring up to 17 mm. The left occipital infarct is contain to the white matter. 2. Progression of advanced diffuse cerebral atrophy and white matter disease. Electronically Signed   By: San Morelle M.D.   On: 01/01/2018 09:29   Mr Cervical Spine Wo Contrast  Result Date: 01/02/2018 CLINICAL DATA:  Back pain.  Myelopathy.  Metastatic lung cancer. EXAM: MRI CERVICAL, THORACIC SPINE WITHOUT CONTRAST TECHNIQUE: Multiplanar and multiecho pulse sequences of the cervical spine, to include the craniocervical junction and cervicothoracic junction, and thoracic spine, were obtained without intravenous contrast. COMPARISON:  CT chest December 30, 2017, MRI cervical spine May 08, 2011. FINDINGS: MRI CERVICAL SPINE FINDINGS ALIGNMENT: Broad reversed cervical lordosis.  No malalignment. VERTEBRAE/DISCS: Abnormal infiltrative low T1, bright STIR signal LEFT C5, C6 articular pillars and  lateral vertebral bodies, and adjacent soft tissues. Intact vertebral bodies intact. Severe progressed disc height loss C3-4 through C6-7 with severe subacute discogenic endplate changes. Heterogeneously bright STIR signal C4-5 disc favoring mineralization. Moderate to severe C3-4 degenerative disc. 8 mm low signal lesion in clivus, potential metastasis. CORD:Multifocal spinal cord myelomalacia at sites of prior cord edema. No syrinx. POSTERIOR FOSSA, VERTEBRAL ARTERIES, PARASPINAL TISSUES: No MR findings of ligamentous injury. Vertebral artery flow voids present. Multiple paraspinous cystic masses at C7 and T1 measuring to 17 mm these may arrive from the spinous process. DISC LEVELS: C2-3: Uncovertebral hypertrophy. Severe RIGHT and moderate LEFT facet arthropathy. No canal stenosis. Moderate RIGHT neural foraminal narrowing. C3-4: Uncovertebral hypertrophy mild facet arthropathy. No canal stenosis. Mild neural foraminal narrowing. C4-5: Uncovertebral hypertrophy. Mild facet arthropathy. No canal stenosis. Moderate LEFT neural foraminal narrowing with soft tissue component. C5-6: Small broad-based disc osteophyte complex. Abnormal soft tissue effacing LEFT neural foramen. No canal stenosis. Moderate RIGHT neural foraminal narrowing. C6-7: Small broad-based disc osteophyte complex. No canal stenosis. Moderate to severe bilateral neural foraminal narrowing. C7-T1: Uncovertebral hypertrophy. No canal stenosis. Mild LEFT neural foraminal narrowing. MRI THORACIC SPINE FINDINGS ALIGNMENT: Maintenance of the thoracic kyphosis. No malalignment. VERTEBRAE/DISCS: Vertebral bodies are intact. Intervertebral discs morphology maintained with mild desiccation. Abnormal signal bilateral T12 ribs compatible with metastasis. Subcentimeter bright STIR signal RIGHT T5 facet. CORD: Thoracic spinal cord is normal morphology and signal characteristics to the level of the conus medullaris which terminates at. PREVERTEBRAL AND PARASPINAL  SOFT TISSUES: Abnormal masses within the mediastinum and RIGHT lung consistent with tumor with small RIGHT pleural effusion. DISC LEVELS: No disc bulge, canal stenosis nor neural foraminal narrowing. IMPRESSION: MRI cervical spine: 1. LEFT C5 and C6 interosseous and paraspinal mass consistent with metastatic disease. Paraspinous nodules C7 and T1 concerning for metastatic disease. Potential clival metastasis. No pathologic fracture. Contrast enhanced sequences would better characterize tumor. 2. Multi cervical spinal cord myelomalacia.  No syrinx. 3. Progressed degenerative change of the cervical spine. No canal stenosis. 4. Neural foraminal narrowing C2-3 through C6-7: Moderate to severe at C6-7. Tumor effacing LEFT C5-6 and to lesser extent LEFT C4-5 neural foramen. MRI thoracic spine: 1. Bilateral T12 rib osseous metastasis. Extensive mediastinal and RIGHT lung metastasis. RIGHT T5 facet metastasis without pathologic fracture. Contrast enhanced sequences may detected additional metastasis. 2. No canal stenosis or neural foraminal narrowing. Electronically Signed   By: Elon Alas M.D.   On: 01/02/2018 19:06   Mr Cervical Spine W Contrast  Result Date: 01/03/2018 CLINICAL DATA:  84 year old male with large right lung mass discovered four days ago. Slurred speech and worsening lower extremity weakness. Lower extremity symptoms not explained  by small posterior circulation infarcts seen on MRI 01/01/2018. Spinal and paraspinal metastases in the cervical and thoracic spine demonstrated yesterday without IV contrast. EXAM: MRI CERVICAL SPINE WITH CONTRAST MRI THORACIC SPINE WITH CONTRAST MRI LUMBAR SPINE WITHOUT AND WITH CONTRAST TECHNIQUE: Multisequence MR imaging of the spine from the cervical spine to the sacrum was performed prior to and following IV contrast administration for evaluation of spinal metastatic disease. CONTRAST:  75m MULTIHANCE GADOBENATE DIMEGLUMINE 529 MG/ML IV SOLN COMPARISON:   Noncontrast cervical and thoracic MRI 01/02/2018. Noncontrast brain MRI 01/01/2018. Chest CT 12/30/2017. FINDINGS: MRI CERVICAL SPINE FINDINGS Alignment: Stable since yesterday with mild reversal of cervical lordosis. Vertebrae: Left vertebral enhancing metastasis with epidural and paraspinal extraosseous extension re-demonstrated at the C5-C6 level on the left. As seen yesterday, the tumor is approximately 3 centimeters in diameter centered at the left C5 pedicle and C6 neural foramen. There is epidural extension of tumor into the left lateral spinal canal at C5 (series 19, image 18). This abuts the lateral left spinal cord without mass effect on the cord. The left C5 and C6 neural foramina are completely invaded by tumor. No other cervical vertebral tumor identified. Cord: No abnormal spinal cord enhancement. Abnormal left lateral epidural space at C5, but no dural thickening. Posterior Fossa, vertebral arteries, paraspinal tissues: There is a 17 millimeter round posterior right paraspinal soft tissue metastasis abutting the C7 spinous process (series 18, image 7 and series 19, image 31. The small clivus lesion is enhancing and highly suspicious for small metastasis. No abnormal enhancement identified in the posterior fossa. Disc levels: Stable since yesterday, no cervical spinal stenosis. MRI THORACIC SPINE FINDINGS Segmentation: Absent ribs at T12. This is the same numbering system used yesterday. Alignment:  Stable.  Moderate dextroconvex scoliosis. Vertebrae: The ribs at T12 are absent, not involved by tumor or metastasis. No thoracic vertebral tumor is identified. The right lung mass closely abuts the right thoracic spine but there is no foraminal or vertebral invasion identified. Cord: On the sagittal post-contrast images today there is speckled artifact projecting over the T8 and T9 spinal levels. No abnormal spinal cord enhancement. No thoracic dural thickening or enhancement. Paraspinal and other soft  tissues: Stable since yesterday. Disc levels: Stable since yesterday, no thoracic spinal stenosis. MRI LUMBAR SPINE FINDINGS Segmentation:  Normal, confirming absent ribs at T12. Alignment: Preserved lumbar lordosis. Superimposed dextroconvex upper lumbar curvature. Vertebrae: There is a 2 centimeter round enhancing metastasis in the right superior L2 vertebral body (series 5, image 6). No extraosseous extension. No other lumbar metastasis identified. No central sacral metastasis. Possible small metastasis partially visible in the medial right iliac bone on series 8, image 34. Conus medullaris: Extends to the T11-T12 level and was best demonstrated on the thoracic MRI yesterday. The cauda equina nerve roots are non thickened and there is no abnormal lumbar intradural enhancement. No dural thickening. Paraspinal and other soft tissues: There is abnormal prevertebral and mesenteric soft tissue nodules with enhancement in the visible abdomen suggesting widespread metastatic lymphadenopathy or mesenteric tumor. Some of the largest lesions are 15-17 millimeter short axis (series 7, image 25 and 27). Superimposed small volume of pelvic free fluid (series 6, image 1). Negative visualized posterior paraspinal soft tissues. Disc levels: Lumbar spine degeneration, worst at L4-L5 and eccentric to the right. No associated spinal stenosis. There is severe right L4 foraminal stenosis. IMPRESSION: 1. No spinal cord or cauda equina metastasis. No spinal cord or cauda equina stenosis/compression. But there is severe degenerative right L4  neural foraminal stenosis. 2. No thoracic spine metastasis (absent ribs at T12). Isolated small lumbar metastasis in the L2 vertebral body. 3. Re-demonstrated left C5 vertebral and paraspinal metastasis with infiltration of the left C5 and C6 neural foramina. There is left lateral epidural tumor at the C5 spinal cord level, but no cord compression or enhancement. 4. Extensive soft tissue nodularity  in the visible lower abdominal mesentery and lumbar prevertebral space compatible with disseminated soft tissue or lymph node metastases. Associated small volume lower abdominal and pelvic free fluid. 5. Small posterior paraspinal soft tissue metastasis abutting the C7 spinous process. Small clivus metastasis suspected. Electronically Signed   By: Genevie Ann M.D.   On: 01/03/2018 12:12   Mr Thoracic Spine Wo Contrast  Result Date: 01/02/2018 CLINICAL DATA:  Back pain.  Myelopathy.  Metastatic lung cancer. EXAM: MRI CERVICAL, THORACIC SPINE WITHOUT CONTRAST TECHNIQUE: Multiplanar and multiecho pulse sequences of the cervical spine, to include the craniocervical junction and cervicothoracic junction, and thoracic spine, were obtained without intravenous contrast. COMPARISON:  CT chest December 30, 2017, MRI cervical spine May 08, 2011. FINDINGS: MRI CERVICAL SPINE FINDINGS ALIGNMENT: Broad reversed cervical lordosis.  No malalignment. VERTEBRAE/DISCS: Abnormal infiltrative low T1, bright STIR signal LEFT C5, C6 articular pillars and lateral vertebral bodies, and adjacent soft tissues. Intact vertebral bodies intact. Severe progressed disc height loss C3-4 through C6-7 with severe subacute discogenic endplate changes. Heterogeneously bright STIR signal C4-5 disc favoring mineralization. Moderate to severe C3-4 degenerative disc. 8 mm low signal lesion in clivus, potential metastasis. CORD:Multifocal spinal cord myelomalacia at sites of prior cord edema. No syrinx. POSTERIOR FOSSA, VERTEBRAL ARTERIES, PARASPINAL TISSUES: No MR findings of ligamentous injury. Vertebral artery flow voids present. Multiple paraspinous cystic masses at C7 and T1 measuring to 17 mm these may arrive from the spinous process. DISC LEVELS: C2-3: Uncovertebral hypertrophy. Severe RIGHT and moderate LEFT facet arthropathy. No canal stenosis. Moderate RIGHT neural foraminal narrowing. C3-4: Uncovertebral hypertrophy mild facet arthropathy. No  canal stenosis. Mild neural foraminal narrowing. C4-5: Uncovertebral hypertrophy. Mild facet arthropathy. No canal stenosis. Moderate LEFT neural foraminal narrowing with soft tissue component. C5-6: Small broad-based disc osteophyte complex. Abnormal soft tissue effacing LEFT neural foramen. No canal stenosis. Moderate RIGHT neural foraminal narrowing. C6-7: Small broad-based disc osteophyte complex. No canal stenosis. Moderate to severe bilateral neural foraminal narrowing. C7-T1: Uncovertebral hypertrophy. No canal stenosis. Mild LEFT neural foraminal narrowing. MRI THORACIC SPINE FINDINGS ALIGNMENT: Maintenance of the thoracic kyphosis. No malalignment. VERTEBRAE/DISCS: Vertebral bodies are intact. Intervertebral discs morphology maintained with mild desiccation. Abnormal signal bilateral T12 ribs compatible with metastasis. Subcentimeter bright STIR signal RIGHT T5 facet. CORD: Thoracic spinal cord is normal morphology and signal characteristics to the level of the conus medullaris which terminates at. PREVERTEBRAL AND PARASPINAL SOFT TISSUES: Abnormal masses within the mediastinum and RIGHT lung consistent with tumor with small RIGHT pleural effusion. DISC LEVELS: No disc bulge, canal stenosis nor neural foraminal narrowing. IMPRESSION: MRI cervical spine: 1. LEFT C5 and C6 interosseous and paraspinal mass consistent with metastatic disease. Paraspinous nodules C7 and T1 concerning for metastatic disease. Potential clival metastasis. No pathologic fracture. Contrast enhanced sequences would better characterize tumor. 2. Multi cervical spinal cord myelomalacia.  No syrinx. 3. Progressed degenerative change of the cervical spine. No canal stenosis. 4. Neural foraminal narrowing C2-3 through C6-7: Moderate to severe at C6-7. Tumor effacing LEFT C5-6 and to lesser extent LEFT C4-5 neural foramen. MRI thoracic spine: 1. Bilateral T12 rib osseous metastasis. Extensive mediastinal and RIGHT lung  metastasis. RIGHT T5  facet metastasis without pathologic fracture. Contrast enhanced sequences may detected additional metastasis. 2. No canal stenosis or neural foraminal narrowing. Electronically Signed   By: Elon Alas M.D.   On: 01/02/2018 19:06   Mr Thoracic Spine W Contrast  Result Date: 01/03/2018 CLINICAL DATA:  84 year old male with large right lung mass discovered four days ago. Slurred speech and worsening lower extremity weakness. Lower extremity symptoms not explained by small posterior circulation infarcts seen on MRI 01/01/2018. Spinal and paraspinal metastases in the cervical and thoracic spine demonstrated yesterday without IV contrast. EXAM: MRI CERVICAL SPINE WITH CONTRAST MRI THORACIC SPINE WITH CONTRAST MRI LUMBAR SPINE WITHOUT AND WITH CONTRAST TECHNIQUE: Multisequence MR imaging of the spine from the cervical spine to the sacrum was performed prior to and following IV contrast administration for evaluation of spinal metastatic disease. CONTRAST:  72m MULTIHANCE GADOBENATE DIMEGLUMINE 529 MG/ML IV SOLN COMPARISON:  Noncontrast cervical and thoracic MRI 01/02/2018. Noncontrast brain MRI 01/01/2018. Chest CT 12/30/2017. FINDINGS: MRI CERVICAL SPINE FINDINGS Alignment: Stable since yesterday with mild reversal of cervical lordosis. Vertebrae: Left vertebral enhancing metastasis with epidural and paraspinal extraosseous extension re-demonstrated at the C5-C6 level on the left. As seen yesterday, the tumor is approximately 3 centimeters in diameter centered at the left C5 pedicle and C6 neural foramen. There is epidural extension of tumor into the left lateral spinal canal at C5 (series 19, image 18). This abuts the lateral left spinal cord without mass effect on the cord. The left C5 and C6 neural foramina are completely invaded by tumor. No other cervical vertebral tumor identified. Cord: No abnormal spinal cord enhancement. Abnormal left lateral epidural space at C5, but no dural thickening. Posterior  Fossa, vertebral arteries, paraspinal tissues: There is a 17 millimeter round posterior right paraspinal soft tissue metastasis abutting the C7 spinous process (series 18, image 7 and series 19, image 31. The small clivus lesion is enhancing and highly suspicious for small metastasis. No abnormal enhancement identified in the posterior fossa. Disc levels: Stable since yesterday, no cervical spinal stenosis. MRI THORACIC SPINE FINDINGS Segmentation: Absent ribs at T12. This is the same numbering system used yesterday. Alignment:  Stable.  Moderate dextroconvex scoliosis. Vertebrae: The ribs at T12 are absent, not involved by tumor or metastasis. No thoracic vertebral tumor is identified. The right lung mass closely abuts the right thoracic spine but there is no foraminal or vertebral invasion identified. Cord: On the sagittal post-contrast images today there is speckled artifact projecting over the T8 and T9 spinal levels. No abnormal spinal cord enhancement. No thoracic dural thickening or enhancement. Paraspinal and other soft tissues: Stable since yesterday. Disc levels: Stable since yesterday, no thoracic spinal stenosis. MRI LUMBAR SPINE FINDINGS Segmentation:  Normal, confirming absent ribs at T12. Alignment: Preserved lumbar lordosis. Superimposed dextroconvex upper lumbar curvature. Vertebrae: There is a 2 centimeter round enhancing metastasis in the right superior L2 vertebral body (series 5, image 6). No extraosseous extension. No other lumbar metastasis identified. No central sacral metastasis. Possible small metastasis partially visible in the medial right iliac bone on series 8, image 34. Conus medullaris: Extends to the T11-T12 level and was best demonstrated on the thoracic MRI yesterday. The cauda equina nerve roots are non thickened and there is no abnormal lumbar intradural enhancement. No dural thickening. Paraspinal and other soft tissues: There is abnormal prevertebral and mesenteric soft tissue  nodules with enhancement in the visible abdomen suggesting widespread metastatic lymphadenopathy or mesenteric tumor. Some of the largest lesions  are 15-17 millimeter short axis (series 7, image 25 and 27). Superimposed small volume of pelvic free fluid (series 6, image 1). Negative visualized posterior paraspinal soft tissues. Disc levels: Lumbar spine degeneration, worst at L4-L5 and eccentric to the right. No associated spinal stenosis. There is severe right L4 foraminal stenosis. IMPRESSION: 1. No spinal cord or cauda equina metastasis. No spinal cord or cauda equina stenosis/compression. But there is severe degenerative right L4 neural foraminal stenosis. 2. No thoracic spine metastasis (absent ribs at T12). Isolated small lumbar metastasis in the L2 vertebral body. 3. Re-demonstrated left C5 vertebral and paraspinal metastasis with infiltration of the left C5 and C6 neural foramina. There is left lateral epidural tumor at the C5 spinal cord level, but no cord compression or enhancement. 4. Extensive soft tissue nodularity in the visible lower abdominal mesentery and lumbar prevertebral space compatible with disseminated soft tissue or lymph node metastases. Associated small volume lower abdominal and pelvic free fluid. 5. Small posterior paraspinal soft tissue metastasis abutting the C7 spinous process. Small clivus metastasis suspected. Electronically Signed   By: Genevie Ann M.D.   On: 01/03/2018 12:12   Mr Lumbar Spine W Wo Contrast  Result Date: 01/03/2018 CLINICAL DATA:  84 year old male with large right lung mass discovered four days ago. Slurred speech and worsening lower extremity weakness. Lower extremity symptoms not explained by small posterior circulation infarcts seen on MRI 01/01/2018. Spinal and paraspinal metastases in the cervical and thoracic spine demonstrated yesterday without IV contrast. EXAM: MRI CERVICAL SPINE WITH CONTRAST MRI THORACIC SPINE WITH CONTRAST MRI LUMBAR SPINE WITHOUT AND  WITH CONTRAST TECHNIQUE: Multisequence MR imaging of the spine from the cervical spine to the sacrum was performed prior to and following IV contrast administration for evaluation of spinal metastatic disease. CONTRAST:  59m MULTIHANCE GADOBENATE DIMEGLUMINE 529 MG/ML IV SOLN COMPARISON:  Noncontrast cervical and thoracic MRI 01/02/2018. Noncontrast brain MRI 01/01/2018. Chest CT 12/30/2017. FINDINGS: MRI CERVICAL SPINE FINDINGS Alignment: Stable since yesterday with mild reversal of cervical lordosis. Vertebrae: Left vertebral enhancing metastasis with epidural and paraspinal extraosseous extension re-demonstrated at the C5-C6 level on the left. As seen yesterday, the tumor is approximately 3 centimeters in diameter centered at the left C5 pedicle and C6 neural foramen. There is epidural extension of tumor into the left lateral spinal canal at C5 (series 19, image 18). This abuts the lateral left spinal cord without mass effect on the cord. The left C5 and C6 neural foramina are completely invaded by tumor. No other cervical vertebral tumor identified. Cord: No abnormal spinal cord enhancement. Abnormal left lateral epidural space at C5, but no dural thickening. Posterior Fossa, vertebral arteries, paraspinal tissues: There is a 17 millimeter round posterior right paraspinal soft tissue metastasis abutting the C7 spinous process (series 18, image 7 and series 19, image 31. The small clivus lesion is enhancing and highly suspicious for small metastasis. No abnormal enhancement identified in the posterior fossa. Disc levels: Stable since yesterday, no cervical spinal stenosis. MRI THORACIC SPINE FINDINGS Segmentation: Absent ribs at T12. This is the same numbering system used yesterday. Alignment:  Stable.  Moderate dextroconvex scoliosis. Vertebrae: The ribs at T12 are absent, not involved by tumor or metastasis. No thoracic vertebral tumor is identified. The right lung mass closely abuts the right thoracic spine  but there is no foraminal or vertebral invasion identified. Cord: On the sagittal post-contrast images today there is speckled artifact projecting over the T8 and T9 spinal levels. No abnormal spinal cord enhancement. No  thoracic dural thickening or enhancement. Paraspinal and other soft tissues: Stable since yesterday. Disc levels: Stable since yesterday, no thoracic spinal stenosis. MRI LUMBAR SPINE FINDINGS Segmentation:  Normal, confirming absent ribs at T12. Alignment: Preserved lumbar lordosis. Superimposed dextroconvex upper lumbar curvature. Vertebrae: There is a 2 centimeter round enhancing metastasis in the right superior L2 vertebral body (series 5, image 6). No extraosseous extension. No other lumbar metastasis identified. No central sacral metastasis. Possible small metastasis partially visible in the medial right iliac bone on series 8, image 34. Conus medullaris: Extends to the T11-T12 level and was best demonstrated on the thoracic MRI yesterday. The cauda equina nerve roots are non thickened and there is no abnormal lumbar intradural enhancement. No dural thickening. Paraspinal and other soft tissues: There is abnormal prevertebral and mesenteric soft tissue nodules with enhancement in the visible abdomen suggesting widespread metastatic lymphadenopathy or mesenteric tumor. Some of the largest lesions are 15-17 millimeter short axis (series 7, image 25 and 27). Superimposed small volume of pelvic free fluid (series 6, image 1). Negative visualized posterior paraspinal soft tissues. Disc levels: Lumbar spine degeneration, worst at L4-L5 and eccentric to the right. No associated spinal stenosis. There is severe right L4 foraminal stenosis. IMPRESSION: 1. No spinal cord or cauda equina metastasis. No spinal cord or cauda equina stenosis/compression. But there is severe degenerative right L4 neural foraminal stenosis. 2. No thoracic spine metastasis (absent ribs at T12). Isolated small lumbar metastasis  in the L2 vertebral body. 3. Re-demonstrated left C5 vertebral and paraspinal metastasis with infiltration of the left C5 and C6 neural foramina. There is left lateral epidural tumor at the C5 spinal cord level, but no cord compression or enhancement. 4. Extensive soft tissue nodularity in the visible lower abdominal mesentery and lumbar prevertebral space compatible with disseminated soft tissue or lymph node metastases. Associated small volume lower abdominal and pelvic free fluid. 5. Small posterior paraspinal soft tissue metastasis abutting the C7 spinous process. Small clivus metastasis suspected. Electronically Signed   By: Genevie Ann M.D.   On: 01/03/2018 12:12   Ir US Guide Bx Asp/drain  Result Date: 01/01/2018 INDICATION: 84 year old male with right upper lobe mass and omental nodularity concerning for metastatic lung cancer. He presents for ultrasound-guided biopsy of the largest left upper quadrant omental mass. EXAM: Ultrasound-guided biopsy, soft tissue MEDICATIONS: None. ANESTHESIA/SEDATION: A total is 0.5 mg Versed administered intravenously. FLUOROSCOPY TIME:  Fluoroscopy Time: 0 minutes 0 seconds (0 mGy). COMPLICATIONS: None immediate. PROCEDURE: Informed written consent was obtained from the patient after a thorough discussion of the procedural risks, benefits and alternatives. All questions were addressed. Maximal Sterile Barrier Technique was utilized including caps, mask, sterile gowns, sterile gloves, sterile drape, hand hygiene and skin antiseptic. A timeout was performed prior to the initiation of the procedure. The left upper quadrant was interrogated with ultrasound. A large heterogeneous soft tissue mass was successfully identified. A suitable skin entry site was selected and marked. Local anesthesia was attained by infiltration with 1% lidocaine. A small dermatotomy was made. Under real-time ultrasound guidance, an 18 gauge introducer needle was advanced into the margin of the mass.  Several core biopsies were then obtained coaxially using the bio Pince automated biopsy device. Biopsy specimens were placed in saline and delivered to pathology for further analysis. IMPRESSION: Ultrasound-guided core biopsy of left upper quadrant omental mass. Electronically Signed   By: Jacqulynn Cadet M.D.   On: 01/01/2018 16:18   Dg Swallowing Func-speech Pathology  Result Date: 01/02/2018 Objective Swallowing  Evaluation: Type of Study: MBS-Modified Barium Swallow Study  Patient Details Name: Quantay Zaremba MRN: [id number] Date of Birth: [birth year] Today's Date: 01/02/2018 Time: SLP Start Time (ACUTE ONLY): 0947 -SLP Stop Time (ACUTE ONLY): 1000 SLP Time Calculation (min) (ACUTE ONLY): 13 min Past Medical History: Past Medical History: Diagnosis Date . Anemia  . Arthritis   "right hip; right knee; lower back ~ 1/2 way across" . Atrial fibrillation ([place])   remotely  . Atrial flutter ([place])   typical appearing by ekg. s/p ablation 07/28/11 . Diverticulosis  . Dysphagia   with solids . Esophageal stricture  . GERD (gastroesophageal reflux disease)  . H/O hiatal hernia  . Heart murmur  . Hemorrhoids  . High cholesterol  . Hypertension  . Osteopenia  . Seizures ([place])   "back w/migraine headaches; 1990's or before" . Sinus bradycardia   asymptomatic . SSS (sick sinus syndrome) ([place])  . Stroke [facility] 2012  "left me weaker on left side; balance is not good" Past Surgical History: Past Surgical History: Procedure Laterality Date . ASD REPAIR  1972  Patch repair . ATRIAL FLUTTER ABLATION N/A 07/28/2011  Procedure: ATRIAL FLUTTER ABLATION;  Surgeon: Thompson Grayer, MD;  Location: [facility] CATH LAB;  Service: Cardiovascular;  Laterality: N/A; . CARDIAC ELECTROPHYSIOLOGY STUDY AND ABLATION  07/28/11 . CARDIOVASCULAR STRESS TEST  03/07/2010  Perfusion defect in the inferior myocardial region is consistent with diaphragmatic attenuation. No ischemia or infarct/scar is seen in the remaining myocardium. No ECG changes. . CAROTID DOPPLER   06/26/2011  Bilateral ICAs-demonstrated normal patency without eivdence of significant diameter reduction, dissection, or any other vascular abnormality. . INGUINAL HERNIA REPAIR  2011  right . IR US GUIDE BX ASP/DRAIN  01/01/2018 . JOINT REPLACEMENT    right knee replacement . KNEE ARTHROSCOPY    right; "maybe twice" . TOTAL KNEE ARTHROPLASTY  07/10/2012  Procedure: TOTAL KNEE ARTHROPLASTY;  Surgeon: Tobi Bastos, MD;  Location: [facility] ORS;  Service: Orthopedics;  Laterality: Right; . TRANSTHORACIC ECHOCARDIOGRAM  12/10/2012  EF 55-60%. No regional wall motion abnormalities. LA moderately dialted.Mild-moderate regurg of the tricuspid valve. HPI: Pt is an 84 y.o. male who presents with complaints of cough, weakness, shortness of breath and fever for approximately 1 week. 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 Pt had an acute episode of choking on water and slurred speech with MRI positive for bilateral acute occipital lobe infarcts, R > L. BSE in February 2014 with a functional appearing oropharyngeal swallow; regular diet, thin liquids recommended. PMH: GERD, esophagel stricture, dilation, dysphagia to solids, stroke, seizures, HTN, HH, afib, chronic back pain  Subjective: pt alert, cooperative, not a clear historian Assessment / Plan / Recommendation [facility] IP CLINICAL IMPRESSIONS 01/02/2018 Clinical Impression Pt has a moderate oropharyngeal dysphagia that may be an acute exacerbation of a likely chronic dysphagia given structural component. Orally he has weak lingual manipulation, tongue pumping, and slow transit. Rather than containing cohesive boluses he allows liquids/solids to spill posteriorly into  the pharynx as his tongue is pumping, which contributes partially to his multiple swallows. Pt does also have large suspected osteophytes (MD not present to confirm), most prominent at C4-5, C5-6, that impacts bolus flow and epiglottic deflection. He has moderate  vallecular residue, which may also contribute to his multiple swallows in an attempt to reduce residue. He has aspiration of thin liquids that occurs repeatedly as he continues to swallow a bolus. He says he cannot tuck his chin because he cannot move  his neck that way. Limiting bolus size to teaspoon helps to better contain the thin liquids above the laryngeal vestibule, preventing further penetration/aspiration. Pt has better airway protection with thicker substances, but his residue increases as well. Once the vallecula is full from soft solids, nectar thick liquids start to penetrate into the vestibule. Recommend Dys 1 diet and nectar thick liquids. SLP will f/u to determine if water via provale cup may be appropriate in between meals. Pt would benefit from SLP f/u for utilization of strategies, strengthening to maximize function in light of structural component. SLP Visit Diagnosis Dysphagia, oropharyngeal phase (R13.12) Attention and concentration deficit following -- Frontal lobe and executive function deficit following -- Impact on safety and function Mild aspiration risk;Moderate aspiration risk   CH[facility] IP TREATMENT RECOMMENDATION 01/02/2018 Treatment Recommendations Therapy as outlined in treatment plan below   Prognosis 01/02/2018 Prognosis for Safe Diet Advancement Fair Barriers to Reach Goals Other (Comment) Barriers/Prognosis Comment -- [facility] IP DIET RECOMMENDATION 01/02/2018 SLP Diet Recommendations Dysphagia 1 (Puree) solids;Nectar thick liquid Liquid Administration via Cup;No straw Medication Administration Crushed with puree Compensations Slow rate;Small sips/bites;Follow solids with liquid Postural Changes Remain semi-upright after after feeds/meals (Comment);Seated upright at 90 degrees   [facility] IP OTHER RECOMMENDATIONS 01/02/2018 Recommended Consults -- Oral Care Recommendations Oral care BID Other Recommendations --   [facility] IP FOLLOW UP RECOMMENDATIONS 01/02/2018 Follow up Recommendations (No Data)   [facility] IP  FREQUENCY AND DURATION 01/02/2018 Speech Therapy Frequency (ACUTE ONLY) min 2x/week Treatment Duration 2 weeks      [facility] IP ORAL PHASE 01/02/2018 Oral Phase Impaired Oral - Pudding Teaspoon -- Oral - Pudding Cup -- Oral - Honey Teaspoon -- Oral - Honey Cup -- Oral - Nectar Teaspoon -- Oral - Nectar Cup Weak lingual manipulation;Lingual pumping;Reduced posterior propulsion;Piecemeal swallowing;Delayed oral transit;Decreased bolus cohesion Oral - Nectar Straw -- Oral - Thin Teaspoon Weak lingual manipulation;Lingual pumping;Reduced posterior propulsion;Piecemeal swallowing;Delayed oral transit;Decreased bolus cohesion Oral - Thin Cup Weak lingual manipulation;Lingual pumping;Reduced posterior propulsion;Piecemeal swallowing;Delayed oral transit;Decreased bolus cohesion Oral - Thin Straw -- Oral - Puree Weak lingual manipulation;Lingual pumping;Reduced posterior propulsion;Piecemeal swallowing;Delayed oral transit;Decreased bolus cohesion Oral - Mech Soft Weak lingual manipulation;Lingual pumping;Reduced posterior propulsion;Piecemeal swallowing;Delayed oral transit;Decreased bolus cohesion;Impaired mastication Oral - Regular -- Oral - Multi-Consistency -- Oral - Pill -- Oral Phase - Comment --  [facility] IP PHARYNGEAL PHASE 01/02/2018 Pharyngeal Phase Impaired Pharyngeal- Pudding Teaspoon -- Pharyngeal -- Pharyngeal- Pudding Cup -- Pharyngeal -- Pharyngeal- Honey Teaspoon -- Pharyngeal -- Pharyngeal- Honey Cup -- Pharyngeal -- Pharyngeal- Nectar Teaspoon -- Pharyngeal -- Pharyngeal- Nectar Cup Delayed swallow initiation-pyriform sinuses;Reduced epiglottic inversion;Reduced airway/laryngeal closure;Reduced tongue base retraction;Pharyngeal residue - valleculae;Penetration/Aspiration during swallow Pharyngeal Material enters airway, remains ABOVE vocal cords then ejected out Pharyngeal- Nectar Straw -- Pharyngeal -- Pharyngeal- Thin Teaspoon Delayed swallow initiation-pyriform sinuses;Reduced epiglottic inversion;Reduced  airway/laryngeal closure;Reduced tongue base retraction;Pharyngeal residue - valleculae Pharyngeal -- Pharyngeal- Thin Cup Delayed swallow initiation-pyriform sinuses;Reduced epiglottic inversion;Reduced airway/laryngeal closure;Reduced tongue base retraction;Pharyngeal residue - valleculae;Penetration/Aspiration during swallow;Penetration/Apiration after swallow Pharyngeal Material enters airway, passes BELOW cords and not ejected out despite cough attempt by patient Pharyngeal- Thin Straw -- Pharyngeal -- Pharyngeal- Puree Delayed swallow initiation-pyriform sinuses;Reduced epiglottic inversion;Reduced airway/laryngeal closure;Reduced tongue base retraction;Pharyngeal residue - valleculae Pharyngeal -- Pharyngeal- Mechanical Soft Delayed swallow initiation-pyriform sinuses;Reduced epiglottic inversion;Reduced airway/laryngeal closure;Reduced tongue base retraction;Pharyngeal residue - valleculae Pharyngeal -- Pharyngeal- Regular -- Pharyngeal -- Pharyngeal- Multi-consistency -- Pharyngeal -- Pharyngeal- Pill -- Pharyngeal -- Pharyngeal Comment --  [facility] IP CERVICAL ESOPHAGEAL PHASE 01/02/2018 Cervical Esophageal  Phase WFL Pudding Teaspoon -- Pudding Cup -- Honey Teaspoon -- Honey Cup -- Nectar Teaspoon -- Nectar Cup -- Nectar Straw -- Thin Teaspoon -- Thin Cup -- Thin Straw -- Puree -- Mechanical Soft -- Regular -- Multi-consistency -- Pill -- Cervical Esophageal Comment -- No flowsheet data found. Germain Osgood 01/02/2018, 12:47 PM  Germain Osgood, M.A. CCC-SLP [phone]              DISCHARGE EXAMINATION: See progress note from earlier today  DISPOSITION: Residential hospice    Allergies as of 01/04/2018   No Known Allergies     Medication List    STOP taking these medications   atorvastatin 20 MG tablet Commonly known as:  LIPITOR   doxazosin 4 MG tablet Commonly known as:  CARDURA   ferrous sulfate 325 (65 FE) MG tablet   furosemide 40 MG tablet Commonly known as:  LASIX    multivitamin with minerals Tabs tablet   SYSTANE ULTRA 0.4-0.3 % Soln Generic drug:  Polyethyl Glycol-Propyl Glycol     TAKE these medications   acetaminophen 500 MG tablet Commonly known as:  TYLENOL Take 1,000 mg by mouth every 6 (six) hours as needed for mild pain or moderate pain.   amoxicillin-clavulanate 875-125 MG tablet Commonly known as:  AUGMENTIN Take 1 tablet by mouth every 12 (twelve) hours for 3 days.   finasteride 5 MG tablet Commonly known as:  PROSCAR Take 1 tablet (5 mg total) by mouth daily.   HYDROcodone-acetaminophen 10-325 MG tablet Commonly known as:  NORCO Take 1 tablet by mouth every 4 (four) hours as needed for moderate pain. What changed:    when to take this  reasons to take this   phenytoin 100 MG ER capsule Commonly known as:  DILANTIN TAKE 3 CAPSULES AT BEDTIME   tamsulosin 0.4 MG Caps capsule Commonly known as:  FLOMAX Take 1 capsule (0.4 mg total) by mouth 2 (two) times daily.   warfarin 5 MG tablet Commonly known as:  COUMADIN Take as directed. If you are unsure how to take this medication, talk to your nurse or doctor. Original instructions:  TAKE ONE-HALF (1/2) TO ONE TABLET DAILY AS DIRECTED BY COUMADIN CLINIC. NEED INR APPOINTMENT PRIOR TO NEXT REFILL AUTHORIZATION. What changed:    how much to take  how to take this  when to take this  additional instructions          TOTAL DISCHARGE TIME: 35 minutes  [facility] Pager [phone]  01/04/2018, 12:11 PM

## 2018-01-04 NOTE — Progress Notes (Addendum)
Pt is discharging via stretcher to [facility].  Called report to [place] at [facility].  All belongings sent home with family or with pt.  IV removed, no complications.  Foley inserted for end of life care.   1415- Pt picked up by EMS and transported to [facility] via stretcher.  Foley in place.  Report given to EMS.

## 2018-01-04 NOTE — Progress Notes (Addendum)
10:15am [facility] liaison spoke with pt wife but she was unable to meet to do paperwork until Sunday morning due to work schedule.  Pt wife gave verbal permission to CSW to have pt sister complete paperwork.  CSW spoke with pt sister, Jinny Sanders, who is agreeable to complete admission paperwork- she works today but is ok with having liaison come to her work to complete.  [facility] liaison to follow up with sister.  9:30am CSW received consult for residential hospice placement at [facility].  CSW spoke with pt wife and confirmed that they are interested in residential hospice and that [facility] would be first choice.  CSW made referral to [facility] liaison- they will review and follow up with CSW regarding approval and bed availability  CSW will continue to follow  Jorge Ny, [facility] Social Worker [phone]

## 2018-01-04 NOTE — Progress Notes (Signed)
ANTICOAGULATION CONSULT NOTE   Pharmacy Consult for warfarin Indication: atrial fibrillation and acute CVA  No Known Allergies  Patient Measurements: Height: 5\' 8"  (172.7 cm) Weight: 145 lb 4.5 oz (65.9 kg) IBW/kg (Calculated) : 68.4  Vital Signs: Temp: 98 F (36.7 C) (07/26 0743) Temp Source: Oral (07/26 0743) BP: 120/57 (07/26 0743) Pulse Rate: 66 (07/26 0743)  Labs: Recent Labs    01/01/18 2132 01/02/18 0318 01/03/18 0247 01/03/18 1204 01/04/18 0328  HGB  --  12.4*  --  11.7*  --   HCT  --  39.7  --  37.0*  --   PLT  --  194  --  151  --   LABPROT  --  20.3* 26.1*  --  29.5*  INR  --  1.75 2.42  --  2.83  CREATININE  --  1.09  --  1.34*  --   CKTOTAL 107  --   --   --   --     Estimated Creatinine Clearance: 38.3 mL/min (A) (by C-G formula based on SCr of 1.34 mg/dL (H)).   Assessment: 71 YOM with PMH of HTN, stroke, and afib on warfarin PTA who presented on 7/21 with cough and SOB. INR > 10 on admission and given vitamin K for reversal. Worsening AMS on 7/23 and MRI significant for bilateral acute nonhemorrhagic occipital lobe infarcts.   Underwent US guided core omental mass biopsy today with no estimated blood loss. Based on IR protocol post procedure based on standard bleeding risk, okay to resume warfarin therapy evening of the procedure. Pt will be on hospice care. Continue coumadin for now. INR took a nice jump today so we will dose a bit less aggressive today.   INR up to 2.83 this AM Home regimen: 2.5 mg every Sun, Tue, Thu; 5 mg all other days  Goal of Therapy:  INR 2-3 Monitor platelets by anticoagulation protocol: Yes   Plan:  Coumadin 1mg  PO x 1 today Monitor daily INR and CBC Monitor for signs/symptms of bleeding   Onnie Boer, PharmD, Jake Samples, AAHIVP, CPP Infectious Disease Pharmacist Pager: [phone] 01/04/2018 8:18 AM

## 2018-01-04 NOTE — Progress Notes (Addendum)
STROKE TEAM PROGRESS NOTE     INTERVAL HISTORY Patient in bed with no acute distress.  Patient currently awake alert following commands, patient continues to have proximal leg weakness and overall generalized weakness.  He has no new complaints at this time, family not at bedside.  Patient is aware of pending hospice placement today   Vitals:   01/03/18 0741 01/03/18 1632 01/03/18 2326 01/04/18 0743  BP: (!) 102/53 (!) 103/59 (!) 105/55 (!) 120/57  Pulse: 81 78 (!) 59 66  Resp: 17 18 16 18   Temp: 98.1 F (36.7 C)  (!) 97.5 F (36.4 C) 98 F (36.7 C)  TempSrc: Oral  Oral Oral  SpO2: 94% 98% 97% 98%  Weight:      Height:        CBC:  Recent Labs  Lab 12/30/17 0659  01/02/18 0318 01/03/18 1204  WBC 22.9*   < > 29.1* 30.7*  NEUTROABS 18.0*  --   --   --   HGB 13.2   < > 12.4* 11.7*  HCT 41.7   < > 39.7 37.0*  MCV 96.5   < > 98.3 96.4  PLT 287   < > 194 151   < > = values in this interval not displayed.    Basic Metabolic Panel:  Recent Labs  Lab 01/02/18 0318 01/03/18 1204  NA 148* 146*  K 4.2 4.0  CL 119* 119*  CO2 20* 20*  GLUCOSE 113* 116*  BUN 29* 31*  CREATININE 1.09 1.34*  CALCIUM 8.3* 8.1*   Lipid Panel:     Component Value Date/Time   CHOL 93 01/03/2018 0247   CHOL 171 07/17/2017 1622   TRIG 165 (H) 01/03/2018 0247   HDL 25 (L) 01/03/2018 0247   HDL 50 07/17/2017 1622   CHOLHDL 3.7 01/03/2018 0247   VLDL 33 01/03/2018 0247   LDLCALC 35 01/03/2018 0247   LDLCALC 105 (H) 07/17/2017 1622   HgbA1c:  Lab Results  Component Value Date   HGBA1C 5.8 (H) 01/03/2018   Urine Drug Screen: No results found for: LABOPIA, COCAINSCRNUR, LABBENZ, AMPHETMU, THCU, LABBARB  Alcohol Level No results found for: [name]  Mr Cervical/Thoracic/ Lumbar Spine W Wo Contrast 01/03/2018 IMPRESSION:  1. No spinal cord or cauda equina metastasis. No spinal cord or cauda equina stenosis/compression. But there is severe degenerative right L4 neural foraminal stenosis. 2. No  thoracic spine metastasis (absent ribs at T12). Isolated small lumbar metastasis in the L2 vertebral body. 3. Re-demonstrated left C5 vertebral and paraspinal metastasis with infiltration of the left C5 and C6 neural foramina. There is left lateral epidural tumor at the C5 spinal cord level, but no cord compression or enhancement. 4. Extensive soft tissue nodularity in the visible lower abdominal mesentery and lumbar prevertebral space compatible with disseminated soft tissue or lymph node metastases. Associated small volume lower abdominal and pelvic free fluid. 5. Small posterior paraspinal soft tissue metastasis abutting the C7 spinous process. Small clivus metastasis suspected.   PHYSICAL EXAM Frail cachectic elderly African-American male not in distress. . Afebrile. Head is nontraumatic. Neck is supple without bruit.    Cardiac exam no murmur or gallop. Lungs are clear to auscultation. Distal pulses are well felt. Neurological Exam :  Patient is awake and interactive. speech is hypophonic and slightly dysarthric. Follows commands without difficulty.  extraocular movements intact without evidence of nystagmus. blinks to threat bilaterally. Fundi not visualized. Vision acuity seems adequate. Face is symmetric without weakness. Tongue is midline.  She  noted to have overall generalized weakness in all extremities: With symmetric upper extremity strength without 4/5weakness. Lower extremity exam shows mild paraparesis bilaterally with 2/5 proximal hip flexor weakness with mild hip abductor and knee flexor weakness.mild 4/5 weakness distally.Plantars are both equivocal. Sensation appears intact bilaterally. Gait deferred  ASSESSMENT/PLAN [name] is a 84 y.o. 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  Admitted for sepsis and encephalopathy.  During hospitalization, patient had an episode of slurred speech and neurology was consulted.  MRI shows bilateral occipital infarcts, which are an incidental finding.   Stroke:   Incidental bilateral occipital subcortical infarcts, embolic secondary to known AF on warfarin vs d/t Small vessel disease. -etiology of strokes likely due to anticoagulation reversal with now suboptimal INR  INR was 10 on admission with warfarin reversed.  Possibly lead to hypercoagulable state.    CT head No acute stroke. Small vessel disease. Atrophy.   MRI bilateral right greater than left occipital lobe infarcts.  Right occipital and cortical area.  Left occipital and white matter.  Advanced cerebral atrophy.  White matter disease.  2D Echo  EF 65-70%. No source of embolus   EEG with background slowing, no seizure activity LDL pending   HgbA1c 5.8   Warfarin for VTE prophylaxis  warfarin daily prior to admission INR 10.0, reversed on arrival, now on warfarin daily. INR 1.75.  Recommend continue warfarin for secondary stroke prevention  Therapy recommendations: SNF  Disposition: In patient hospice -due to the poor prognosis of his metastatic lung cancer  BLE hip weakness clearly related to myelopathy with vertebral body, clivus and epidural metastasis and degenerative lumbar spine disease with L4-5 bilateral foraminal narrowing  Brisk reflexes   Not related to stroke  Cervical and thoracic spine MRI w/o contrast:No spinal cord or cauda equina metastasis. No spinal cord or cauda equina stenosis/compression but did note multiple areas of metastatsis  Atrial Fibrillation/Flutter  Home anticoagulation:  warfarin daily continued in the hospital  INR 10.0 on admission, reversed  Back on warfarin with pharmacy managing   INR 2.83   Hypertension  Stable . BP goal  normotensive  Hyperlipidemia  Home meds: Atorvastatin 20mg  mg daily, resumed in hospital  LDL pending, goal < 70  Other Stroke Risk Factors  Advanced age  ETOH use, advised to drink no more than 2 drink(s) a day  Hx seizures w Migraines. EEG neg for seziures.  Hx stroke  ASD repair 7062  Chronic diastolic CHF  Other Active Problems  Right upper lobe  lung mass  Hypoxemia  Coagulopathy   Postobstructive PNA  Acute hypoxic respiratory failure  1 of 2 blood cultures positive for staph felt to be contaminant  Hospital day # 5 Letha Cape NP 01/04/2018 12:38 PM I have personally examined this patient, reviewed notes, independently viewed imaging studies, participated in medical decision making and plan of care.ROS completed by me personally and pertinent positives fully documented  I have made any additions or clarifications directly to the above note. Agree with note above. Oncologist as discussed with family who have agreed on palliative care and hospice patient will be transferred when bed available. Stroke team will sign off. Kindly call for questions.  Antony Contras, MD Medical  Director [facility] Pager: [phone] 01/04/2018 1:56 PM  To contact Stroke Continuity provider, please refer to [email or web address] After hours, contact General Neurology

## 2018-01-05 ENCOUNTER — Encounter (HOSPITAL_COMMUNITY): Payer: Self-pay | Admitting: Pharmacist Clinician (PhC)/ Clinical Pharmacy Specialist

## 2018-01-06 LAB — CULTURE, BLOOD (ROUTINE X 2)
Culture: NO GROWTH
Culture: NO GROWTH
SPECIAL REQUESTS: ADEQUATE
SPECIAL REQUESTS: ADEQUATE

## 2018-01-10 DEATH — deceased

## 2018-03-19 ENCOUNTER — Ambulatory Visit: Payer: Self-pay | Admitting: Pharmacist Clinician (PhC)/ Clinical Pharmacy Specialist

## 2018-03-19 DIAGNOSIS — Z7901 Long term (current) use of anticoagulants: Secondary | ICD-10-CM

## 2018-03-19 DIAGNOSIS — I484 Atypical atrial flutter: Secondary | ICD-10-CM

## 2019-07-21 IMAGING — RF DG SWALLOWING FUNCTION - NRPT [facility]
13 of 24 series · 13 of 24 positions shown · non-contrast
Comparison: none

[Series 1: run · 1 of 15 frames shown (1 of 13)]
[frame 1/15]
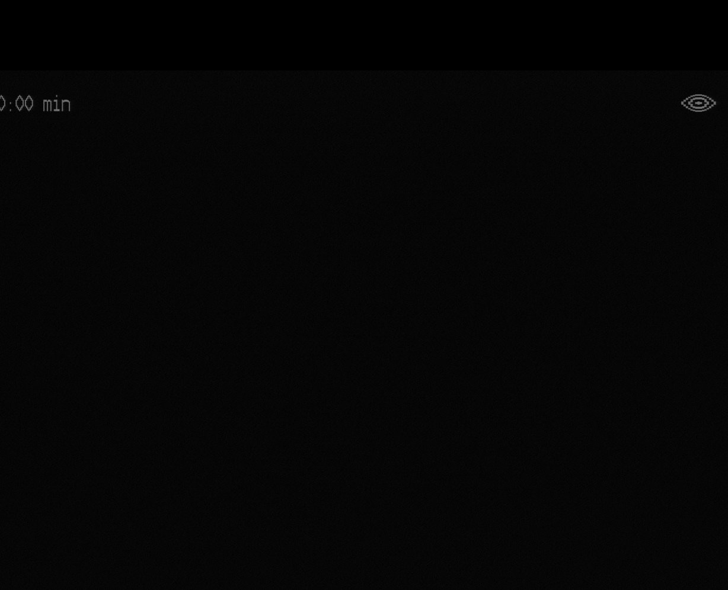

[Series 3: run · 1 of 384 frames shown (2 of 13)]
[frame 1/384]
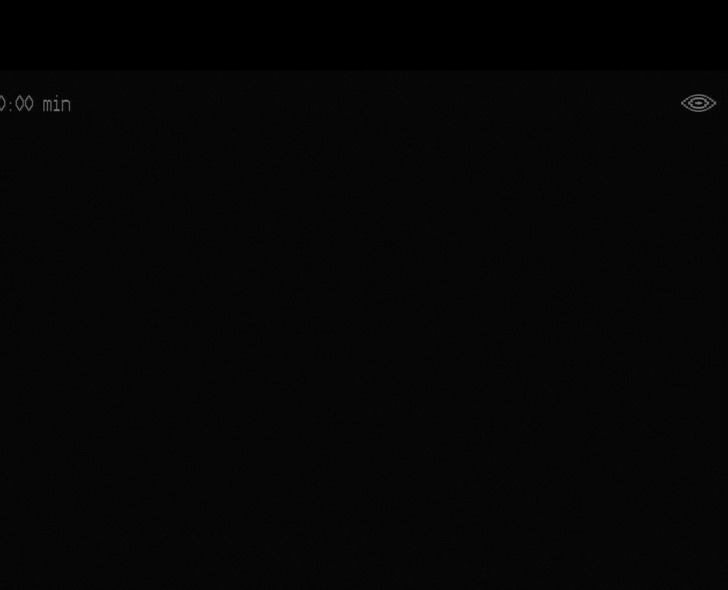

[Series 5: run · 1 of 258 frames shown (3 of 13)]
[frame 39/258]
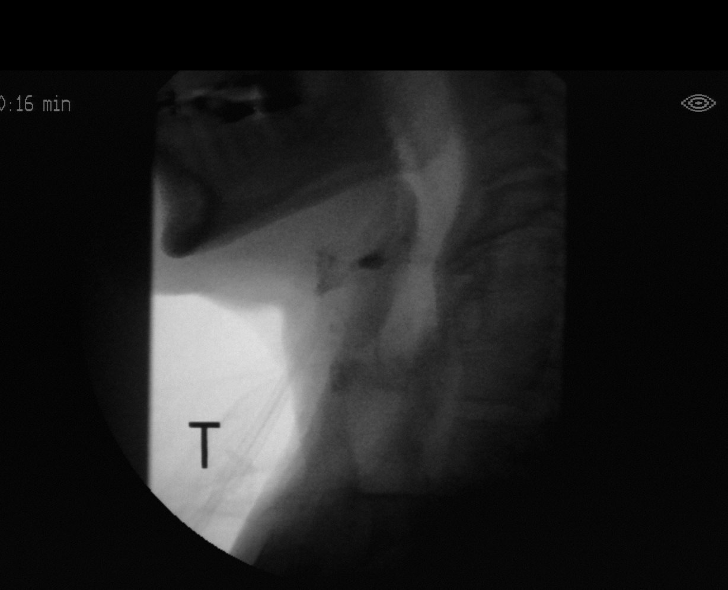

[Series 7: run · 1 of 324 frames shown (4 of 13)]
[frame 163/324]
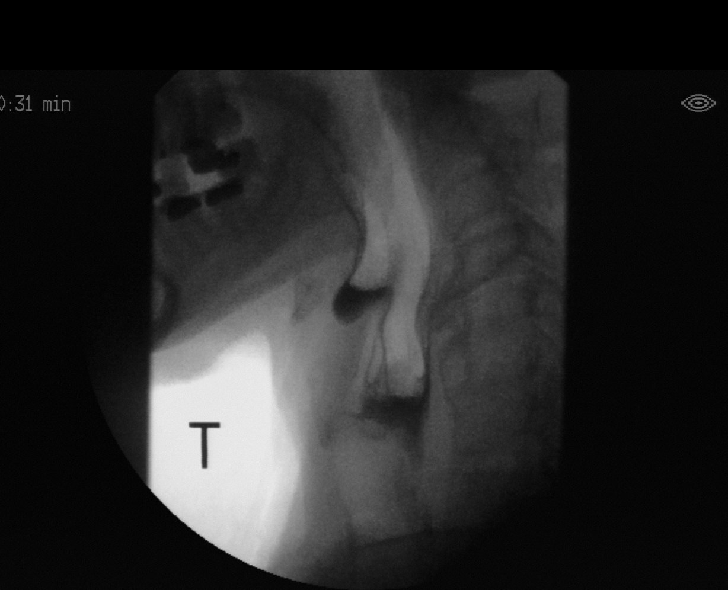

[Series 9: run · 1 of 148 frames shown (5 of 13)]
[frame 75/148]
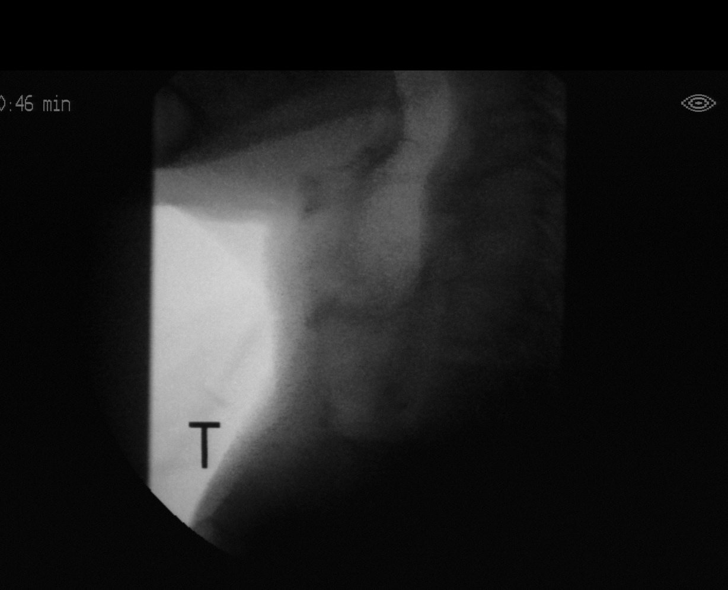

[Series 11: run · 1 of 213 frames shown (6 of 13)]
[frame 169/213]
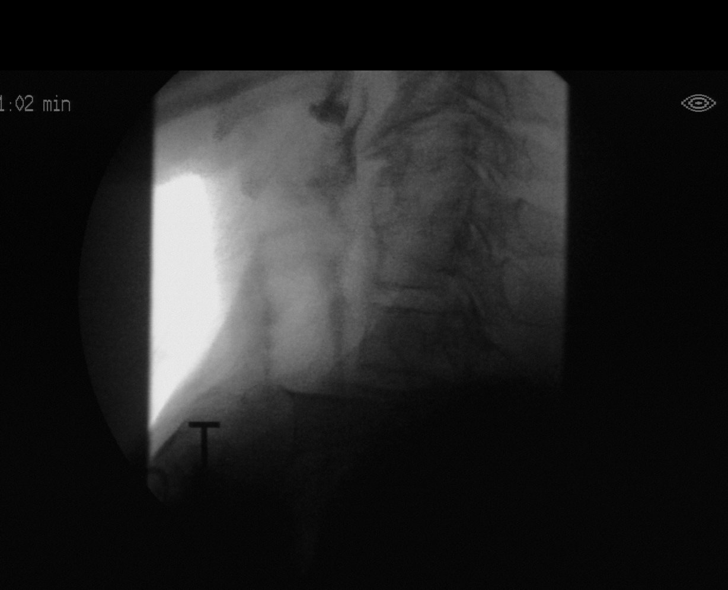

[Series 13: run · 1 of 484 frames shown (7 of 13)]
[frame 243/484]
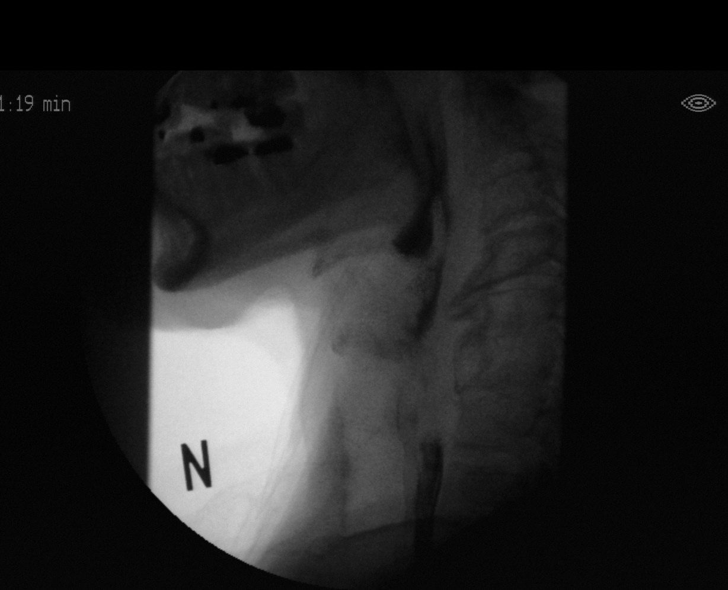

[Series 14: run · 1 of 156 frames shown (8 of 13)]
[frame 79/156]
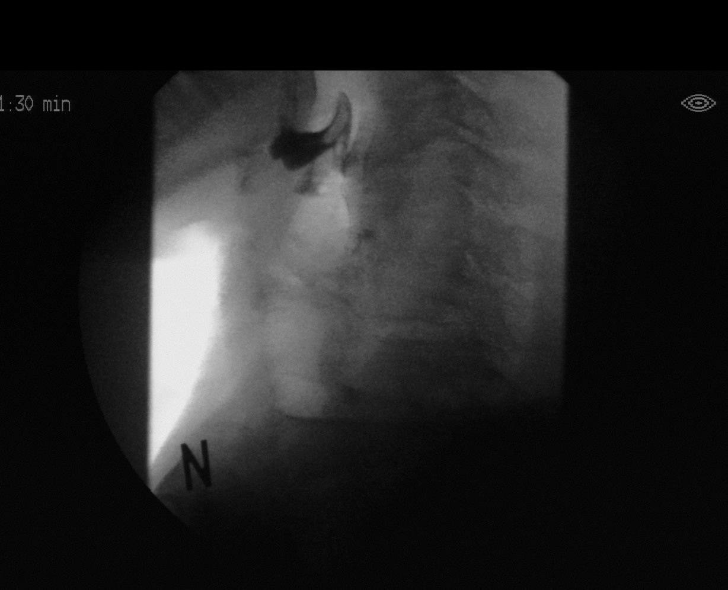

[Series 16: run · 1 of 439 frames shown (9 of 13)]
[frame 324/439]
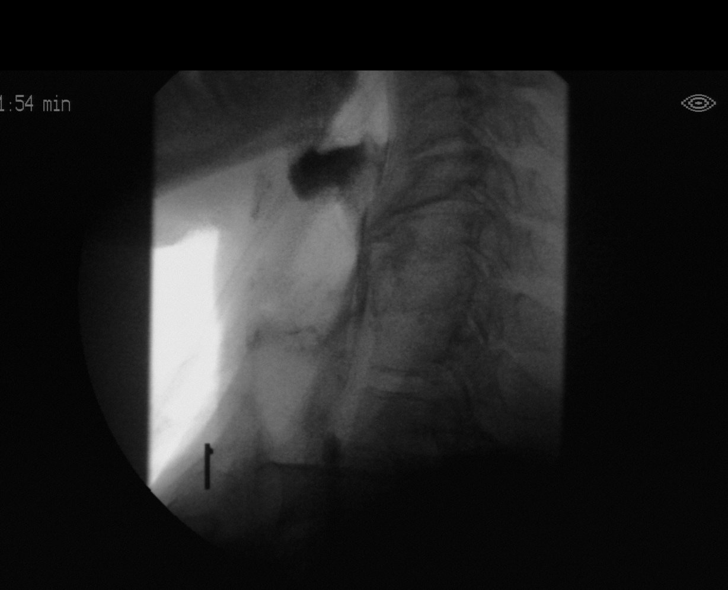

[Series 18: run · 1 of 250 frames shown (10 of 13)]
[frame 126/250]
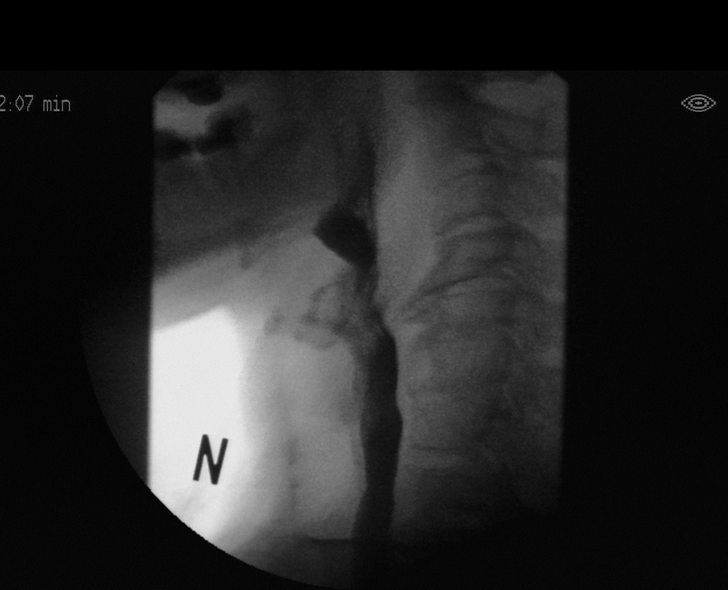

[Series 20: run · 1 of 61 frames shown (11 of 13)]
[frame 52/61]
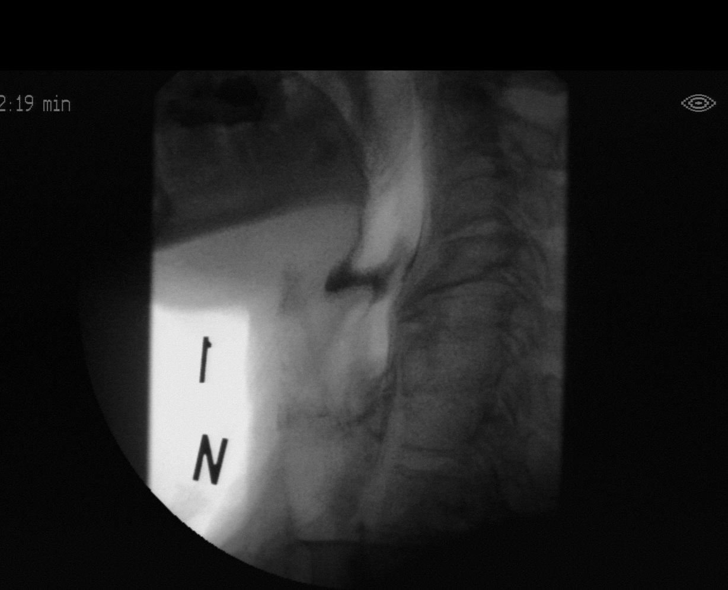

[Series 22: run · 1 of 430 frames shown (12 of 13)]
[frame 366/430]
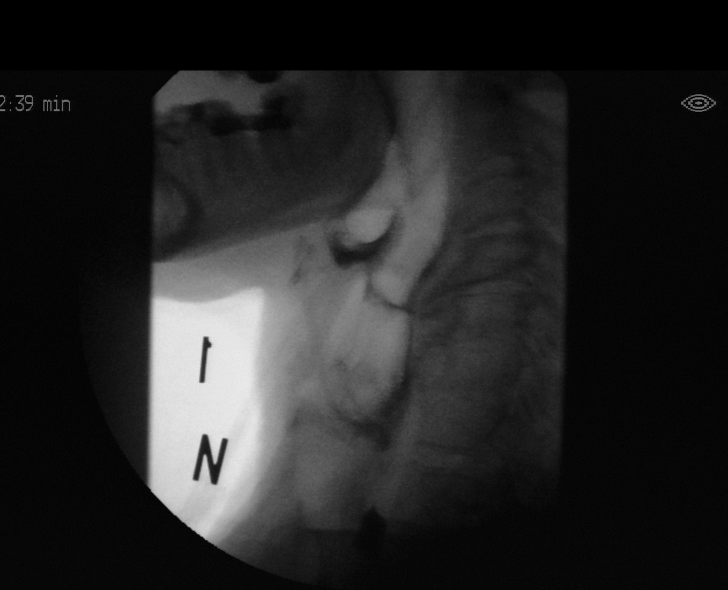

[Series 24: run · 1 of 70 frames shown (13 of 13)]
[frame 60/70]
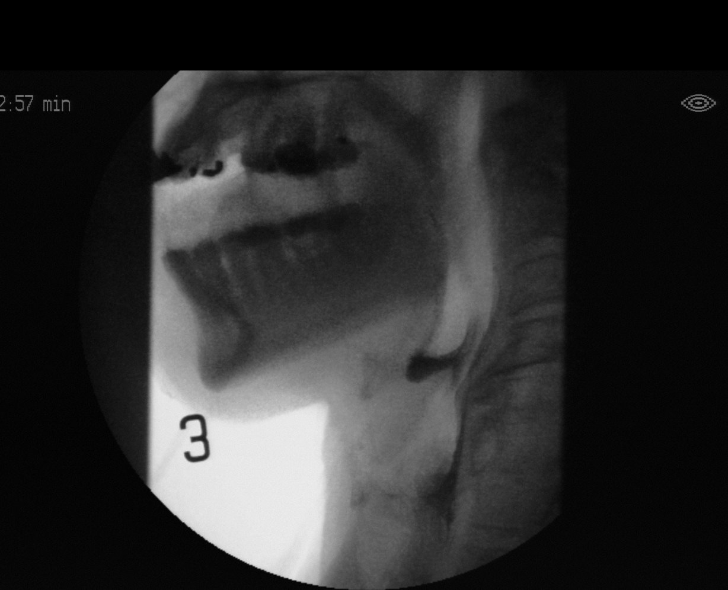

[13 of 24 positions shown; findings below may reference images not displayed]

FLUOROSCOPY FOR SWALLOWING FUNCTION STUDY:
Fluoroscopy was provided for swallowing function study, which was administered by a speech pathologist.  Final results and recommendations from this study are contained within the speech pathology report.
